# Patient Record
Sex: Female | Born: 1935 | Race: Black or African American | Hispanic: No | Smoking: Former smoker
Health system: Southern US, Community
[De-identification: ages and names within clinical notes are randomized; demographics above are authoritative.]

## PROBLEM LIST (undated history)

## (undated) DIAGNOSIS — G20 Parkinson's disease: Secondary | ICD-10-CM

## (undated) DIAGNOSIS — E785 Hyperlipidemia, unspecified: Secondary | ICD-10-CM

## (undated) DIAGNOSIS — R7989 Other specified abnormal findings of blood chemistry: Secondary | ICD-10-CM

## (undated) DIAGNOSIS — Z7901 Long term (current) use of anticoagulants: Secondary | ICD-10-CM

## (undated) DIAGNOSIS — G709 Myoneural disorder, unspecified: Secondary | ICD-10-CM

## (undated) DIAGNOSIS — F0390 Unspecified dementia without behavioral disturbance: Secondary | ICD-10-CM

## (undated) DIAGNOSIS — M1990 Unspecified osteoarthritis, unspecified site: Secondary | ICD-10-CM

## (undated) DIAGNOSIS — K219 Gastro-esophageal reflux disease without esophagitis: Secondary | ICD-10-CM

## (undated) DIAGNOSIS — E079 Disorder of thyroid, unspecified: Secondary | ICD-10-CM

## (undated) DIAGNOSIS — E039 Hypothyroidism, unspecified: Secondary | ICD-10-CM

## (undated) DIAGNOSIS — G20A1 Parkinson's disease without dyskinesia, without mention of fluctuations: Secondary | ICD-10-CM

## (undated) DIAGNOSIS — I739 Peripheral vascular disease, unspecified: Secondary | ICD-10-CM

## (undated) DIAGNOSIS — M79674 Pain in right toe(s): Secondary | ICD-10-CM

## (undated) DIAGNOSIS — I10 Essential (primary) hypertension: Secondary | ICD-10-CM

## (undated) HISTORY — PX: ABDOMINAL HYSTERECTOMY: SHX81

## (undated) HISTORY — DX: Unspecified dementia, unspecified severity, without behavioral disturbance, psychotic disturbance, mood disturbance, and anxiety: F03.90

## (undated) HISTORY — DX: Gastro-esophageal reflux disease without esophagitis: K21.9

## (undated) HISTORY — PX: OTHER SURGICAL HISTORY: SHX169

## (undated) HISTORY — DX: Essential (primary) hypertension: I10

## (undated) HISTORY — DX: Other specified abnormal findings of blood chemistry: R79.89

## (undated) HISTORY — DX: Unspecified osteoarthritis, unspecified site: M19.90

## (undated) HISTORY — DX: Disorder of thyroid, unspecified: E07.9

## (undated) HISTORY — DX: Pain in right toe(s): M79.674

## (undated) HISTORY — DX: Long term (current) use of anticoagulants: Z79.01

## (undated) HISTORY — DX: Peripheral vascular disease, unspecified: I73.9

## (undated) HISTORY — DX: Hyperlipidemia, unspecified: E78.5

---

## 2000-08-29 HISTORY — PX: FEMORAL-POPLITEAL BYPASS GRAFT: SHX937

## 2001-04-25 ENCOUNTER — Emergency Department (HOSPITAL_COMMUNITY): Admission: EM | Admit: 2001-04-25 | Discharge: 2001-04-25 | Payer: Self-pay | Admitting: Emergency Medicine

## 2001-04-25 ENCOUNTER — Encounter: Payer: Self-pay | Admitting: Emergency Medicine

## 2001-04-27 ENCOUNTER — Encounter: Payer: Self-pay | Admitting: Vascular Surgery

## 2001-04-27 ENCOUNTER — Encounter (INDEPENDENT_AMBULATORY_CARE_PROVIDER_SITE_OTHER): Payer: Self-pay | Admitting: *Deleted

## 2001-04-28 ENCOUNTER — Inpatient Hospital Stay (HOSPITAL_COMMUNITY): Admission: RE | Admit: 2001-04-28 | Discharge: 2001-05-03 | Payer: Self-pay | Admitting: Vascular Surgery

## 2001-07-23 ENCOUNTER — Other Ambulatory Visit: Admission: RE | Admit: 2001-07-23 | Discharge: 2001-07-23 | Payer: Self-pay | Admitting: Internal Medicine

## 2001-08-17 ENCOUNTER — Encounter: Payer: Self-pay | Admitting: Internal Medicine

## 2001-08-17 ENCOUNTER — Encounter: Admission: RE | Admit: 2001-08-17 | Discharge: 2001-08-17 | Payer: Self-pay | Admitting: Internal Medicine

## 2003-07-29 ENCOUNTER — Encounter: Admission: RE | Admit: 2003-07-29 | Discharge: 2003-07-29 | Payer: Self-pay | Admitting: Internal Medicine

## 2004-03-29 ENCOUNTER — Encounter: Admission: RE | Admit: 2004-03-29 | Discharge: 2004-03-29 | Payer: Self-pay | Admitting: Internal Medicine

## 2005-07-19 ENCOUNTER — Encounter: Admission: RE | Admit: 2005-07-19 | Discharge: 2005-07-19 | Payer: Self-pay | Admitting: Internal Medicine

## 2007-04-13 ENCOUNTER — Ambulatory Visit: Payer: Self-pay | Admitting: Vascular Surgery

## 2007-08-21 ENCOUNTER — Encounter: Admission: RE | Admit: 2007-08-21 | Discharge: 2007-08-21 | Payer: Self-pay | Admitting: Internal Medicine

## 2008-03-05 ENCOUNTER — Ambulatory Visit: Payer: Self-pay | Admitting: Vascular Surgery

## 2008-08-21 ENCOUNTER — Encounter: Admission: RE | Admit: 2008-08-21 | Discharge: 2008-08-21 | Payer: Self-pay | Admitting: Internal Medicine

## 2009-05-08 ENCOUNTER — Ambulatory Visit: Payer: Self-pay | Admitting: Vascular Surgery

## 2009-09-03 ENCOUNTER — Encounter: Admission: RE | Admit: 2009-09-03 | Discharge: 2009-09-03 | Payer: Self-pay | Admitting: Internal Medicine

## 2010-07-20 ENCOUNTER — Ambulatory Visit: Payer: Self-pay | Admitting: Vascular Surgery

## 2010-09-19 ENCOUNTER — Encounter: Payer: Self-pay | Admitting: Internal Medicine

## 2011-01-11 NOTE — Procedures (Signed)
LOWER EXTREMITY ARTERIAL EVALUATION-SINGLE LEVEL   INDICATION:  Followup of known peripheral vascular disease.  The patient  denies claudication and rest pain bilaterally.   HISTORY:  Diabetes:  No.  Cardiac:  No.  Hypertension:  Yes.  Smoking:  No.  Previous Surgery:  Left femoral/popliteal artery thrombectomies in  03/2001 by Dr. Arbie Cookey.   RESTING SYSTOLIC PRESSURES: (ABI)                          RIGHT                LEFT  Brachial:               148                  140  Anterior tibial:        166 (1.12)           118  Posterior tibial:       164                  130 (0.88)  Peroneal:                                    120  DOPPLER WAVEFORM ANALYSIS:  Anterior tibial:        Biphasic             Weakly biphasic  Posterior tibial:       Biphasic             Biphasic  Peroneal:                                    Biphasic   PREVIOUS ABI'S:  Date:  04/13/2007  RIGHT:  1.16  LEFT:  0.87   IMPRESSION:  Stable ABIs bilaterally.   ___________________________________________  Larina Earthly, M.D.   PB/MEDQ  D:  03/05/2008  T:  03/05/2008  Job:  [id number]

## 2011-01-11 NOTE — Assessment & Plan Note (Signed)
OFFICE VISIT   Farmer, Tanya  DOB:  [birth year]                                       04/13/2007  EAVWU#:[id number]   Tanya Farmer comes in today for continuing followup of her lower extremity  issues. She is status post bilateral femoral embolectomy in 2002. She is  found to be hypercoagulable. She tends to do quite well. She has had  some progressive dementia according to her daughter but is able live by  herself and take care of her own affairs. She is very active in a  walking program. She reports today that she has some dragging of her  left foot at the end of this but this is news to her daughter and may be  related to dementia. They will continue to watch this. She does have 2+  femoral and 2+ popliteal pulses. I do not palpate pedal pulses  bilaterally. Her feet are well perfused. Her ankle brachial index today  remains stable at normal at 1.0 on the right and 0.87 on the left. I am  quite pleased with her continued result. She will continue her walking  program. I plan to see her again in 1 year with repeat lower extremity  arterial studies.   Larina Earthly, M.D.  Electronically Signed   TFE/MEDQ  D:  04/13/2007  T:  04/16/2007  Job:  [id number]   cc:   Thora Lance, M.D.  Mathis Fare, MD

## 2011-01-11 NOTE — Assessment & Plan Note (Signed)
OFFICE VISIT   [place], HUBERTY  DOB:  Jun 08, 1926                                       05/08/2009  ZOXWR#:[id number]   The patient presents today with her daughter for continued followup of  lower extremity arterial insufficiency.  She looks good.  She does  continue to have some mild progressive dementia but has been able to  tolerate this.  She is active in her walking program and her daughter is  quite diligent in continuing to push this.  She is on a long list of  medicines that are attached to her chart.  She has no drug allergies.  She is status post right iliac superficial thrombectomy and a left  femoral popliteal thrombectomy in 2002, and had no new thromboembolic  events since that time.  Her feet are warm and well perfused.  She does  have popliteal pulses bilaterally.  I do not detect pedal pulses.  She  underwent a noninvasive left heart study in our office today and this  reveals no change from a prior study a year ago.  Ankle-arm index is  normal on the right and 0.88 on the left.  I have reassured the patient  with this and her daughter.  I explained that I would be comfortable  following her with just on observation only.  However, they are more  comfortable with continued yearly ankle-arm indices and office visits  with me.  We will see her in 1 year.   Larina Earthly, M.D.  Electronically Signed   TFE/MEDQ  D:  05/08/2009  T:  05/11/2009  Job:  [id number]   cc:   Thora Lance, M.D.

## 2011-01-11 NOTE — Assessment & Plan Note (Signed)
OFFICE VISIT   MYRIA, STEENBERGEN  DOB:  1926-08-29                                       07/20/2010  ZOXWR#:[id number]   The patient presents today for continued followup of her lower extremity  arterial insufficiency.  She is status post a right femoral popliteal  embolectomy by myself in 2002.  She is here today with her daughter.  She continues to do well.  [place] has been living with her daughter since  mid summer while her home is being repaired.  She does have some mild  dementia but her daughter reports this is not progressive.  Her past  history is otherwise unchanged.   PHYSICAL EXAM:  General:  A well-nourished, well-nourished black female  appearing her stated age of 75 in no acute disease.  Vital signs:  Blood  pressure is 110/73, pulse 71, O2 saturation 99% on room air,  respirations are 18.  HEENT:  Normal.  Her pulse status:  She has 2+  radial, 2+ femoral pulses bilaterally with no evidence of false aneurysm  in her right groin.  She does have 1+ popliteal and dorsalis pedal  pulses.  Musculoskeletal:  She has no major deformities or cyanosis.  Neurological:  No focal weakness, paresthesias.  Skin:  Without ulcers  or rashes.   She underwent noninvasive vascular laboratory studies today which I have  ordered and independently interpreted.  This reveals normal ankle arm  index on the right with triphasic waveforms and slightly diminished  waveforms biphasic on the left with an ankle arm index of 0.72.  This  has been stable over the number of years.  I have again discussed this  with the patient and her daughter, recommended continued followup  observation only.  She has been very stable for 9 years now and I feel  comfortable discontinuing her routine followup arterial studies.  I  again discussed symptoms of acute ischemic changes with the patient and  her daughter.  They  will notify us immediately should this occur.  Otherwise they will see [place] on an as-needed basis.     Larina Earthly, M.D.  Electronically Signed   TFE/MEDQ  D:  07/20/2010  T:  07/21/2010  Job:  [id number]   cc:   Thora Lance, M.D.

## 2011-01-14 NOTE — Op Note (Signed)
[place]. [facility]  Patient:    Tanya Farmer, Tanya Farmer Visit Number: [id number] MRN: [id number]          Service Type: Attending:  Larina Earthly, M.D. Dictated by:   Larina Earthly, M.D. Proc. Date: 04/27/01   CC:         Darci Needle, M.D.   Operative Report  PREOPERATIVE DIAGNOSIS:  Emboli to left foot.  POSTOPERATIVE DIAGNOSIS:  Emboli to left foot.  OPERATION: 1. Aortogram. 2. Bilateral lower extremity runoff.  SURGEON:  Larina Earthly, M.D.  ANESTHESIA:  1% lidocaine local.  COMPLICATIONS:  None.  DISPOSITION:  To holding area, stable.  DESCRIPTION OF PROCEDURE:  The patient was taken to the peripheral vascular catheterization lab, placed in supine position, and the area of both groins were prepped and draped in the usual sterile fashion.  Using local anesthesia and a single wall puncture, the right common femoral artery was entered, and guidewire was passed up to the level of the thoracic aorta.  A 5-French sheath was passed over the guidewire, and a pigtail catheter was passed up to the level of the aortic arch.  Thoracic aortic injection was undertaken, and this revealed no evidence of irregularity in the thoracic aorta.  Mesenteric vessels were widely patent on the AP projection.  The catheter was then withdrawn to the level of the renal arteries.  The patient had widely patent single renal arteries and no evidence of atherosclerotic change in the aortoiliac system.  The catheter was then drawn down to the aortic bifurcation, and runoff was obtained.  This showed patent profunda femoris arteries bilaterally with no evidence of thrombus in the profundus femoral system.  There was on the left a complete occlusion of the superficial femoral artery throughout the mid thigh with reconstitution of the popliteal artery at the adductor canal.  The popliteal artery then reoccluded at the knee.  There was reconstitution of the posterior tibial artery  after its takeoff with no visualization of the anterior tibial peroneal artery on the left.  The posterior tibial artery was complete into the foot.  On the right, the superficial femoral artery was patent; however there was a probable thrombus that was nonocclusive in the proximal superficial femoral artery and also irregularity and possible thrombus at the level of the above-knee popliteal artery.  There was what appeared to be chronic occlusion of the anterior tibial artery on the right with normal peroneal and posterior tibial artery runoff into the foot.  The patient tolerated the procedure well without any immediate complication and was transferred to the holding area in stable condition.  FINDINGS: 1. No evidence of aortoiliac occlusive disease. 2. Apparent thrombus, probable embolus to the left superficial femoral artery    with reconstitution of the above-knee popliteal artery and reocclusion of    the popliteal artery at the knee with posterior tibial runoff only on the    left.  On the right, there was nonocclusive thrombus in the superficial    femoral artery with anterior tibial artery occluded and peroneal and    posterior tibial runoff to the foot. Dictated by:   Larina Earthly, M.D. Attending:  Larina Earthly, M.D. DD:  04/27/01 TD:  04/27/01 Job: [id number]

## 2011-01-14 NOTE — Discharge Summary (Signed)
[place]. [facility]  Patient:    Tanya Farmer, Tanya Farmer Visit Number: [id number] MRN: [id number]          Service Type: MED Location: 2000 2035 [id number] Attending Physician:  Alyson Locket Dictated by:   Lissa Merlin, P.A. Admit Date:  04/27/2001 Discharge Date: 05/03/2001   CC:         CVTS Office  Darci Needle, M.D.  Lowell C. Catha Gosselin, M.D.  Laurette Schimke, M.D., [place], [place]   Discharge Summary  DATE OF BIRTH:  [birth year]  PRIMARY CARE:  Laurette Schimke, M.D., [place], [place]  CARDIOLOGY:  Darci Needle, M.D.  ADMISSION DIAGNOSIS:  Ischemic lower extremities with cyanotic toes.  DISCHARGE DIAGNOSES: 1. 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 2. Right superficial femoral artery occlusion. 3. Thrombocytosis.  PREVIOUS MEDICAL CONDITIONS: 1. Hypothyroidism. 2. GERD. 3. Hypertension. 4. Sick sinus syndrome with relative bradycardia. 5. Hypercholesterolemia. 6. Degenerative arthritis.  BRIEF HISTORY:  The patient is a 77 year old African-American female who was visiting her daughter in the [place] area.  She currently lives in [place], [place].  She presented to the [facility] Emergency Room on August 28 with blue toes.  Exam also showed a left bulging calf. Dr. Tawanna Cooler Early evaluated Tanya Farmer with an aortogram on August 30, which showed left superficial femoral artery and popliteal artery occlusion and right superficial femoral artery occlusion.  Dr. Arbie Cookey recommended surgery. Risks, benefits, details, and alternatives were discussed and it was agreed to proceed.  She was taken to the OR for left femoral and popliteal embolectomy and right femoral embolectomy.  She was taken to unit 3300, stable. Hematology was consulted to evaluate thrombocytosis.  Dr. Catha Gosselin saw Tanya Farmer. He recommended chronic Coumadin therapy.  Tanya Farmer remained stable on unit 3300 while waiting for her INR to increase.   She was given physical therapy daily with gradual improvement in her walking.  She was given a walker to take home.  By May 03, 2001, she was therapeutic on her Coumadin.  Her lower extremities were well perfused.  She had palpable pulses in her lower extremities.  She was afebrile.  Vital signs were stable.  She was deemed suitable for discharge home and was subsequently discharged.  PROCEDURES: 1. Aortogram with bilateral lower extremity runoff on April 27, 2001. 2. Left FEM-POP embolectomy and right femoral embolectomy on April 27, 2001. 3. TEE on April 27, 2001 showing no vegetations.  MEDICATIONS: 1. HCTZ 25 mg 1 p.o. q.d. 2. Prilosec 20 mg 1 p.o. q.d. 3. Synthroid 0.1 mcg 1 p.o. q.d. 4. Altace 2.5 mg 1 p.o. q.d. 5. Darvocet 1-2 p.o. q.4-6h. p.r.n. for pain. 6. Klor-Con 10 mg p.o. b.i.d. 7. Coumadin 5 mg tablets daily or as directed.  ALLERGIES:  No known drug allergies.  CONDITION:  Stable.  SPECIAL INSTRUCTIONS:  She was told to do no driving, no heavy lifting, or strenuous activity.  Clean her wounds gently daily with soap and water and to be alert for increasing swelling, drainage, or fever and to call the office if she had any problems.  She was told the importance of having her blood drawn to check her Coumadin as scheduled and to follow up with Dr. Catha Gosselin in eight weeks.  FOLLOW-UP: 1. Home health R.N. will draw blood on May 04, 2001 with pro time result    to Dr. Nicholes Mango office. 2. Home health physical therapy and occupational therapy. 3. Dr.  Early May 10, 2001 at 2 p.m. with ABIs of the lower extremities. 4. Dr. Catha Gosselin eight weeks after discharge. Dictated by:   Lissa Merlin, P.A. Attending Physician:  Alyson Locket DD:  05/15/01 TD:  05/15/01 Job: [id number]

## 2011-01-14 NOTE — Consult Note (Signed)
[place]. [facility]  Patient:    Tanya Farmer, Tanya Farmer Visit Number: [id number] MRN: [id number]          Service Type: DSU Location: [facility] 2899 [id number] Attending Physician:  Alyson Locket Dictated by:   Lowell C. Catha Gosselin, M.D. Proc. Date: 04/27/01 Admit Date:  04/27/2001   CC:         Larina Earthly, M.D.   Consultation Report  SUMMARY:  The patient is a 77 year old female from [place], [place], who was noted by her daughter to have blue toes last week.  She returned to [facility] with her daughter and went to [facility] Room for evaluation.  Arteriogram done April 27, 2001 showed findings of a left superficial femoral artery and popliteal occlusion with right superficial femoral artery occlusion.  The patient was subsequently admitted for bilateral fem-pop embolectomy, which she underwent earlier today.  Preoperative laboratory work showed a hemoglobin of 13.5, white count 7.5, platelet count 711000.  We were consulted due to work-up for her possible hypercoagulable state and thrombocytosis.  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  History of hypercholesterolemia. History of degenerative arthritis.  History of early sick sinus syndrome.  MEDICATIONS:  At home, include diazepam, hydrochlorothiazide, potassium, Prilosec, and Synthroid.  ALLERGY:  None.  FAMILY HISTORY:  Positive for hypertension.  SOCIAL HISTORY:  She lives in [place] by herself.   She is divorced and has two children.  She is employed by [employer].  She has also worked as a Producer, television/film/video.  No alcohol or tobacco use.  No recent mammograms, no colonoscopy.  REVIEW OF SYSTEMS:  There is a 12 pound weight loss over the last six months, pretty good appetite otherwise.  Positive cough due to reflux.  PHYSICAL EXAMINATION:  VITAL SIGNS:  Temperature 95.9, pulse 48, respirations 7, blood  pressure 154/70.  O2 saturation 100% on 4 liters.  HEENT EXAMINATION:  She is normocephalic, atraumatic.  Right pupil is round and reactive; left pupil is slightly irregular postsurgical change and reactive.  ______ , but clear.  LUNGS:  Clear anteriorly.  CARDIOVASCULAR:  Regular rhythm, but bradycardic.  ABDOMEN:  Soft, nontender, no hepatosplenomegaly.  EXTREMITIES:  No edema.  LABORATORY STUDIES:  White count of 7.5, hemoglobin 13.5, MCV of 90.3, platelet count 711000.  Sodium 139, potassium 3.2, BUN of 17, creatinine 0.9, total bilirubin of 0.8, alkaline phosphatase is 70, GOT of 26, GBT of 21, total protein of 7.7, albumin 3.9, calcium 9.6.  Protein C level is back as 98%, which is within normal range.  Pending is a ______ coagulant level and protein S level.  Chest x-ray from April 27, 2001 showed no active lung disease and minimal cardiomegaly.  Abdominal CT and scan from April 25, 2001 showed bilateral simple appearing renal cysts.  Pelvic CT from April 25, 2001 showed no evidence of sources in the iliac arterial system for distal embolization.  ASSESSMENT:  A 77 year old female admitted for fem-pop embolectomy and found to have an elevated platelet count.  Thrombocytosis dates back a while.  Her platelet count in 1994 was 268000, in 1995 278000, in 1996 - 285000, 1997 - 297000, 1998 - 250000, year 2000 358000 and then March 2001 - 681000.  It is certainly possible that iron-deficiency could be the cause of her elevated platelet count.  Platelets also increase due to acute phase reacting effects.  It is possible that the patient has a  central thrombocytosis and might require bone marrow eventually.  However, for now will continue heparin and then coumadinize with a goal INR of around 2.5.  Will follow up for hypercoagulable work-up.  Since the patient is African-American, I would not get a factor V Leiden work-up since that is quite rare in the  African-American population.  Thank you for allowing [place] to share in her care. Dictated by:   Lowell C. Catha Gosselin, M.D. Attending Physician:  Alyson Locket DD:  04/28/01 TD:  04/28/01 Job: [id number]

## 2011-01-14 NOTE — H&P (Signed)
[place]. [facility]  Patient:    Tanya Farmer, Tanya Farmer Visit Number: [id number] MRN: [id number]          Service Type: MED Location: [phone] Attending Physician:  Alyson Locket Dictated by:   Dominica Severin, P.A. Admit Date:  04/27/2001   CC:         Laurette Schimke, M.D., [place], [place]  Darci Needle, M.D.  CVTS office   History and Physical  DATE OF BIRTH:  03/22/1926  CHIEF COMPLAINT:  Blue toes.  HISTORY OF PRESENT ILLNESS:  This patient is a 77 year old African-American female who was visiting her daughter in the [place] area.  She currently resides in [place], [place].  She presented to [facility] ER on August 28 with blue toes.  Exam also revealed left calf bulging.  She was scheduled for outpatient echocardiogram on August 29 by Dr. Garnette Scheuermann to rule out any cardiac source of emboli, and an aortogram with runoff performed by Dr. Gretta Began on August 30 showed left superficial femoral artery and popliteal occlusion and right superficial femoral occlusion.  She was admitted for urgent right femoral popliteal embolectomy on April 27, 2001.  PAST MEDICAL HISTORY:  This was taken from the medical records from Dr. Dorothy Puffer office.  The patient is currently recovering from anesthesia in the post anesthesia care unit.  1. Hypothyroidism. 2. Gastroesophageal reflux disease. 3. Hypertension. 4. Early sick sinus syndrome with relative bradycardia. 5. Hypercholesterolemia. 6. Degenerative arthritis.  ALLERGIES:  No known drug allergies.  MEDICATIONS: 1. Diazepam 2.5 mg q.p.m. 2. Hydrochlorothiazide 25 mg daily. 3. Klor-Con 10 mEq daily. 4. Prilosec 20 mg daily. 5. Synthroid 100 mcg daily.  REVIEW OF SYSTEMS:  Please see HPI for pertinent positives and past medical history, as the patient is a poor historian and is currently recovering from anesthesia.  PHYSICAL EXAMINATION:  VITAL SIGNS:  Currently blood pressure  is 158/70, pulse 60, respiratory rate 13, temperature is 95.9.  HEENT:  PERRLA/EOMI.  NECK:  Without thyromegaly, bruits, or JVD.  HEART:  Irregularly irregular and bradycardic.  LUNGS:  Clear to auscultation anteriorly.  ABDOMEN:  Positive bowel sounds, soft, nontender.  GENITOURINARY:  Foley is draining clear urine.  EXTREMITIES:  Without clubbing or swelling.  Surgical dressing is intact, no drainage in the right groin and left lower extremity.  Palpable posterior tibial pulses bilaterally.  Toes of the left foot have blue tinge, right foot toes are pink.  PREOPERATIVE LABORATORY:  White blood cell count 7.5, hemoglobin 13.5, hematocrit 39.9, and platelet count of 711.  PT is 14.0, INR is 1.1, PTT 32. Sodium was 139, potassium 3.2, chloride 101, CO2 30, BUN 17, creatinine 0.9, glucose 114.  Liver function tests were within normal limits.  Also drawn without any results yet were protein C, protein S, and a Leiden factor.  IMPRESSION:  Status post left femoral and popliteal embolectomy and right femoral embolectomy.  PLAN:  The patient is to be admitted to unit 3300.  She is to be started on heparin drip later on today and we are to await the results of the coag studies, and a hematology consult will be obtained. Dictated by:   Dominica Severin, P.A. Attending Physician:  Alyson Locket DD:  04/29/01 TD:  04/29/01 Job: [id number]

## 2011-01-14 NOTE — Op Note (Signed)
[place]  Patient:    Tanya Farmer, PACK Visit Number: [id number] MRN: [id number]          Service Type: Attending:  Larina Earthly, M.D. Dictated by:   Larina Earthly, M.D. Proc. Date: 04/28/01   CC:         Darci Needle, M.D.   Operative Report  PREOPERATIVE DIAGNOSIS:  Bilateral lower extremity embolus and thrombus.  POSTOPERATIVE DIAGNOSIS:  Bilateral lower extremities embolus and thrombus.  PROCEDURES: 1. Left popliteal artery exploration and femoral and popliteal and    tibial thrombectomy. 2. Right femoral artery exploration with iliac and superficial femoral    artery thrombectomy. 3. Transesophageal echocardiogram per anesthesia.  SURGEON:  Larina Earthly, M.D.  ASSISTANT:  Maxwell Marion, RNFA  ANESTHESIA:  General endotracheal.  COMPLICATIONS:  None.  PROCEDURE IN DETAIL:  The patient was taken to the operating room and placed in a position where both groins and both legs were prepped and draped in the usual sterile fashion.  An incision was made, the medial approach to the below-knee popliteal artery on the left leg, carried down to isolate the popliteal artery which was encircled with a vessel loop.  The popliteal artery had minimal atherosclerotic changes, but obviously was occluded with thrombus. The anterior tibial, posterior tibial, and peroneal arteries were all exposed and encircled with vessel loops.  The patient was given 7000 units of intravenous heparin.  After adequate circulation time, the popliteal artery was opened transversely with an 11 blade above the level of the trifurcation. A 4 Fogarty catheter was passed proximally, and the superficial femoral artery was thrombectomized with thrombus being sent for permanent specimen.  When no further thrombus was removed, there was excellent inflow to this level.  The artery was reoccluded with a vascular clamp.  Next, a 3 Fogarty catheter was passed down the  anterior tibial, posterior tibial, and peroneal arteries sequentially.  The anterior tibial catheter could only be passed approximately 10 cm with apparent chronic occlusion high on the anterior tibial artery.  The peroneal and posterior tibial arteries catheter was passed distal to the level of the ankle.  When no further thrombus was removed from these two vessels, the artery was reoccluded.  The popliteal artery was closed with interrupted 6-0 Prolene sutures.  Clamps were removed, and good Doppler signal was noted in the foot.  Next, a separate incision was made at the right groin, carried down to dissect the common, superficial femoral, and profunda femoris arteries which were encircled with vessel loops.  The common, superficial femoral, and profunda femoris arteries were occluded.  The common femoral artery was opened transversely above the takeoff of the profunda.  The 4 Fogarty catheter was passed up to the level of the aortic bifurcation, and there was nonocclusive thrombus present in the iliac artery, and this was sent for specimen as well. The artery was reoccluded.  Next, the catheter was passed down to the foot and removed with a moderate amount of thrombus removed from the superficial femoral artery.  When no further thrombus was removed with a negative pass, the artery was reoccluded.  The common femoral artery was closed with interrupted 6-0 Prolene sutures.  Clamps were removed.  A Doppler signal was noted in the foot.  The patients heparin was not reversed with Protamine. The wounds were irrigated with saline.  Hemostasis was obtained with electrocautery.  The wounds were closed with 3-0 Vicryl in several layers in the subcutaneous  tissue, and the skin was closed with a 4-0 subcuticular Vicryl stitch.  Sterile dressing was applied, and the patient was taken to the recovery room in stable condition. Dictated by:   Larina Earthly, M.D. Attending:  Larina Earthly, M.D. DD:   04/27/01 TD:  04/28/01 Job: [id number]

## 2011-01-14 NOTE — Procedures (Signed)
[place]  Patient:    HOA, BRIGGS Visit Number: [id number] MRN: [id number]          Service Type: MED Location: [phone] Attending Physician:  Alyson Locket Dictated by:   Cliffton Asters Ivin Booty, M.D. Proc. Date: 04/27/01 Admit Date:  04/27/2001   CC:         Anesthesia Department   Procedure Report  PROCEDURE:  Intraoperative transesophageal echocardiography.  INDICATIONS FOR PROCEDURE:  Ms. Fales was brought to the operating room by Dr. Tawanna Cooler Early for embolectomy of both of her lower extremities.  She had a 2d echocardiogram of her heart earlier in the day and it was difficult to ascertain whether her tricuspid valve had some difficulty, such as a vegetation or other valvular problem.  It was felt that it would be appropriate to try and image her intraoperatively.  DESCRIPTION OF PROCEDURE:  After successful induction of general anesthesia with endotracheal intubation, the [employer] Omnipen TEE probe was passed through the oropharynx into the esophagus atraumatically.  The left ventricle was imaged and found to be contracting well, with no hypertrophy noted.  The right ventricle was somewhat larger than one would expect.  However, it was not overly dilated. The tricuspid valve was quite redundant, but there was no sign of vegetation.  The moderator band was seen clearly.  The mitral valve was then examined and found to oppose nicely with 1+ mitral regurgitation noted on color-flow examination.  The aortic valve was examined and seen to be tricuspid in nature, with no extra calcification noted.  The leaflets all moved well.  There was 1+ aortic regurgitation on the color-flow examination.  The interatrial septum was examined and with color-flow Doppler was found not to have any sign of a PFO.  The left atrial appendage was seen and there was no evidence of thrombus or smoke.  There was an excellent longitudinal view of  the pulmonic valve noted, and there was trace to 1+ regurgitation on color-flow examination.  IMPRESSION:  Relatively normal anatomy in the ventricles, with mild aortic and mitral regurgitation noted.  The tricuspid valve was redundant.  No vegetations were seen. Dictated by:   Cliffton Asters Ivin Booty, M.D. Attending Physician:  Alyson Locket DD:  05/01/01 TD:  05/01/01 Job: [id number]

## 2011-07-20 ENCOUNTER — Other Ambulatory Visit: Payer: Self-pay | Admitting: Internal Medicine

## 2011-07-20 DIAGNOSIS — Z1231 Encounter for screening mammogram for malignant neoplasm of breast: Secondary | ICD-10-CM

## 2011-09-05 ENCOUNTER — Ambulatory Visit: Payer: Self-pay

## 2011-09-22 ENCOUNTER — Ambulatory Visit
Admission: RE | Admit: 2011-09-22 | Discharge: 2011-09-22 | Disposition: A | Discharge status: Home / Self Care | Payer: Medicare Other | Source: Ambulatory Visit | Attending: Internal Medicine | Admitting: Internal Medicine

## 2011-09-22 DIAGNOSIS — Z1231 Encounter for screening mammogram for malignant neoplasm of breast: Secondary | ICD-10-CM

## 2012-04-19 ENCOUNTER — Telehealth: Payer: Self-pay

## 2012-04-19 DIAGNOSIS — D473 Essential (hemorrhagic) thrombocythemia: Secondary | ICD-10-CM

## 2012-04-19 DIAGNOSIS — M79674 Pain in right toe(s): Secondary | ICD-10-CM

## 2012-04-19 NOTE — Telephone Encounter (Signed)
Spoke with daughter to schedule, dpm

## 2012-04-19 NOTE — Telephone Encounter (Signed)
Phone call rec'd from daughter, Tanya Farmer, to report onset of toe pain in right 2nd and 3rd toes this morning.  Daughter reported pt. has hx of elevated platelets, and now platelets are "over 1 million", as reported per her hematologist recently.  Daughter voiced concern of new onset of pain in toes, with present condition of elevated platelets, and hx of left femoral, popliteal, and tibial embolectomy in 2002.  Denies any discoloration of right 2nd and 3rd toes.  States she also noted pt. has "darker, more obvious veins visible in thighs, bilaterally.  Denies any protrusion of veins in thighs.  States pt. denies pain in legs; only c/o pain in above noted toes.  Denies any open sores.  States some swelling in ankles.  States pt. has dementia, and can't always verbalize what she is feeling.  Discussed w/ Dr. Darrick Penna.  Rec'd v.o. for ABI's with office visit.  Advised daughter office will call to schedule appt. and if pt's. symptoms worsen, she should call and report this.  Verb. Understanding.

## 2012-05-02 ENCOUNTER — Encounter: Payer: Self-pay | Admitting: Vascular Surgery

## 2012-05-07 ENCOUNTER — Encounter: Payer: Self-pay | Admitting: Vascular Surgery

## 2012-05-08 ENCOUNTER — Ambulatory Visit (INDEPENDENT_AMBULATORY_CARE_PROVIDER_SITE_OTHER): Payer: Medicare Other | Admitting: Vascular Surgery

## 2012-05-08 ENCOUNTER — Encounter: Payer: Self-pay | Admitting: Vascular Surgery

## 2012-05-08 VITALS — BP 135/62 | HR 65 | Resp 18 | Ht 64.0 in | Wt 135.2 lb

## 2012-05-08 DIAGNOSIS — M79609 Pain in unspecified limb: Secondary | ICD-10-CM

## 2012-05-08 DIAGNOSIS — I739 Peripheral vascular disease, unspecified: Secondary | ICD-10-CM

## 2012-05-08 DIAGNOSIS — D75839 Thrombocytosis, unspecified: Secondary | ICD-10-CM

## 2012-05-08 DIAGNOSIS — D473 Essential (hemorrhagic) thrombocythemia: Secondary | ICD-10-CM

## 2012-05-08 DIAGNOSIS — M79674 Pain in right toe(s): Secondary | ICD-10-CM

## 2012-05-08 NOTE — Progress Notes (Signed)
Patient is known to me from prior bilateral iliac femoral popliteal and tibial thrombectomies and 2002. She had a hypercoagulable state presented with severe ischemia in both legs. She has done well and the subsequent 11 years and has been on Coumadin and also on antiplatelet medication. Recently she has had recurrent of elevated platelets now up over 1 million. She had one episode of some pain in her right second and third toes and is concern that this may be a sign of difficulty. She is seen today for further evaluation. She walks well with a walker and has no limitation. She has no neurologic deficits  Past Medical History  Diagnosis Date  . Hypertension   . Hyperlipidemia   . Thyroid disease     Hypothyroidism  . GERD (gastroesophageal reflux disease)   . Arthritis     Degenerative  . Peripheral vascular disease     Left   Fem-Pop and  tibial Thrombectomy  . High platelet count   . Dementia     History  Substance Use Topics  . Smoking status: Former Games developer  . Smokeless tobacco: Not on file  . Alcohol Use: No    History reviewed. No pertinent family history.  No Known Allergies  Current outpatient prescriptions:amLODipine (NORVASC) 2.5 MG tablet, Take 2.5 mg by mouth daily., Disp: , Rfl: ;  atorvastatin (LIPITOR) 10 MG tablet, Take 10 mg by mouth daily., Disp: , Rfl: ;  donepezil (ARICEPT) 10 MG tablet, Take 10 mg by mouth at bedtime as needed., Disp: , Rfl: ;  hydroxyurea (HYDREA) 500 MG capsule, Take 500 mg by mouth 2 (two) times daily. May take with food to minimize GI side effects., Disp: , Rfl:  levothyroxine (SYNTHROID, LEVOTHROID) 100 MCG tablet, Take 100 mcg by mouth daily., Disp: , Rfl: ;  losartan (COZAAR) 50 MG tablet, Take 50 mg by mouth daily., Disp: , Rfl: ;  traZODone (DESYREL) 50 MG tablet, Take 50 mg by mouth at bedtime., Disp: , Rfl: ;  warfarin (COUMADIN) 5 MG tablet, Take 5 mg by mouth daily., Disp: , Rfl: ;  hydrochlorothiazide (MICROZIDE) 12.5 MG capsule, Take  12.5 mg by mouth 3 (three) times a week., Disp: , Rfl:  potassium chloride (K-DUR,KLOR-CON) 10 MEQ tablet, Take 10 mEq by mouth daily., Disp: , Rfl:   BP 135/62  Pulse 65  Resp 18  Ht 5\' 4"  (1.626 m)  Wt 135 lb 3.2 oz (61.326 kg)  BMI 23.21 kg/m2  Body mass index is 23.21 kg/(m^2).       Physical exam: Well-developed well-nourished black female no acute distress Carotid arteries without bruits bilaterally Heart regular rate and rhythm Pulse status 2+ radial pulses bilaterally. She has 2+ femoral pulses and 2+ right posterior tibial pulse. I do not palpate left pedal pulses Skin without ulcers or rashes Neurologically she is grossly intact Her right foot has no evidence of ischemic changes and no the tissue loss  Noninvasive vascular laboratory study was ordered and independently reviewed. This reveals a normal ankle arm index on the right and normal digital pressures. On the left recommend axis slightly diminished at 0.89 and also with normal digital pressures.  Impression and plan: Stable lower extremity arterial flow with bilateral thrombectomy do to hypercoagulable state 11 years ago. She her daughter reassured regarding this and will continue her walking program. She'll see [place] again on an as-needed basis

## 2012-05-08 NOTE — Progress Notes (Signed)
Ankle brachial index performed @ [facility] 05/08/2012

## 2012-09-10 ENCOUNTER — Telehealth: Payer: Self-pay | Admitting: Vascular Surgery

## 2012-09-10 ENCOUNTER — Telehealth: Payer: Self-pay

## 2012-09-10 DIAGNOSIS — M79676 Pain in unspecified toe(s): Secondary | ICD-10-CM

## 2012-09-10 NOTE — Telephone Encounter (Addendum)
Daughter called to report pt. C/o pain and soreness of 2nd and 3rd toes of right foot.  States this has been occurring for approx. 8 wks.  States that the toes noted are "slightly puffy". Reports that pt. Has had difficulty wearing shoes due to the discomfort.  When questioned about discoloration, daughter stated that "all her toes look slightly discolored, like they are bruised."  Denies any open sores.  Had exam per PCP in December, and reported it then, and told to continue to monitor.  Saw Dr. Arbie Cookey in Sept. 2013, and had reported the discomfort in the same toes at that time.   Daughter requests appt. For pt. To reevaluate.  Discussed w/ Dr. Hart Rochester.  Rec'd v.o. To sched. For ABI's and office visit with Dr. Arbie Cookey.    Daughter stated that the present symptoms are similar to previous condition that required surgery for blood clots.

## 2012-09-10 NOTE — Telephone Encounter (Signed)
notified pt.'s daughter of appt. with tfe on 09-18-12 at 11:30 as per carol's staff message on 09-10-12

## 2012-09-17 ENCOUNTER — Encounter: Payer: Self-pay | Admitting: Vascular Surgery

## 2012-09-18 ENCOUNTER — Encounter (INDEPENDENT_AMBULATORY_CARE_PROVIDER_SITE_OTHER): Payer: Medicare Other | Admitting: *Deleted

## 2012-09-18 ENCOUNTER — Encounter: Payer: Self-pay | Admitting: Vascular Surgery

## 2012-09-18 ENCOUNTER — Ambulatory Visit (INDEPENDENT_AMBULATORY_CARE_PROVIDER_SITE_OTHER): Payer: Medicare Other | Admitting: Vascular Surgery

## 2012-09-18 VITALS — BP 131/78 | HR 64 | Resp 18 | Ht 63.0 in | Wt 133.0 lb

## 2012-09-18 DIAGNOSIS — I739 Peripheral vascular disease, unspecified: Secondary | ICD-10-CM

## 2012-09-18 DIAGNOSIS — M79676 Pain in unspecified toe(s): Secondary | ICD-10-CM

## 2012-09-18 DIAGNOSIS — M79674 Pain in right toe(s): Secondary | ICD-10-CM | POA: Insufficient documentation

## 2012-09-18 DIAGNOSIS — M79609 Pain in unspecified limb: Secondary | ICD-10-CM

## 2012-09-18 NOTE — Progress Notes (Signed)
The patient presents today for evaluation of pain in her second and third toes. I had seen her in September 2013 for similar complaint. She is here today with her daughter. She did have initial presentation of hypercoagulability with bilateral profound ischemia and 2002. She underwent bilateral femoral popliteal and tibial embolectomies and is doing quite well and the 12 year since this. She is having some continued deterioration in her mental capacity but still answers questions appropriately. She denies any pain. He is on chronic Coumadin therapy and also has elevated platelets which is also being treated  Past Medical History  Diagnosis Date  . Hypertension   . Hyperlipidemia   . Thyroid disease     Hypothyroidism  . GERD (gastroesophageal reflux disease)   . Arthritis     Degenerative  . Peripheral vascular disease     Left   Fem-Pop and  tibial Thrombectomy  . High platelet count   . Dementia   . Pain in toe of right foot     History  Substance Use Topics  . Smoking status: Former Games developer  . Smokeless tobacco: Not on file  . Alcohol Use: No    History reviewed. No pertinent family history.  No Known Allergies  Current outpatient prescriptions:amLODipine (NORVASC) 2.5 MG tablet, Take 2.5 mg by mouth daily., Disp: , Rfl: ;  atorvastatin (LIPITOR) 10 MG tablet, Take 10 mg by mouth daily., Disp: , Rfl: ;  donepezil (ARICEPT) 10 MG tablet, Take 10 mg by mouth at bedtime as needed., Disp: , Rfl: ;  hydroxyurea (HYDREA) 500 MG capsule, Take 500 mg by mouth 2 (two) times daily. May take with food to minimize GI side effects., Disp: , Rfl:  levothyroxine (SYNTHROID, LEVOTHROID) 100 MCG tablet, Take 100 mcg by mouth daily., Disp: , Rfl: ;  losartan (COZAAR) 50 MG tablet, Take 50 mg by mouth daily., Disp: , Rfl: ;  pantoprazole (PROTONIX) 40 MG tablet, Take 40 mg by mouth daily., Disp: , Rfl: ;  traZODone (DESYREL) 50 MG tablet, Take 50 mg by mouth at bedtime., Disp: , Rfl: ;  warfarin  (COUMADIN) 5 MG tablet, Take 5 mg by mouth daily., Disp: , Rfl:  hydrochlorothiazide (MICROZIDE) 12.5 MG capsule, Take 12.5 mg by mouth 3 (three) times a week., Disp: , Rfl: ;  potassium chloride (K-DUR,KLOR-CON) 10 MEQ tablet, Take 10 mEq by mouth daily., Disp: , Rfl:   BP 131/78  Pulse 64  Resp 18  Ht 5\' 3"  (1.6 m)  Wt 133 lb (60.328 kg)  BMI 23.56 kg/m2  Body mass index is 23.56 kg/(m^2).       Physical exam well-developed well-nourished female in no acute distress Respirations nonlabored Neurologically she is grossly intact Pulse status: 2+ radial and 2+ right posterior tibial pulse. I do not palpate left pedal pulses Skin without ulcers or rashes.  Vascular lab studies today reveal normal waveform which is triphasic of the right with normal ankle arm index and slightly dampen biphasic waveforms on the left with a maximum index stable at 0.85  No evidence of arterial insufficiency. The patient and her daughter reassured with this. She will continue her usual activity and will see [place] on an as-needed basis

## 2012-10-25 ENCOUNTER — Other Ambulatory Visit: Payer: Self-pay

## 2012-10-25 DIAGNOSIS — Z1231 Encounter for screening mammogram for malignant neoplasm of breast: Secondary | ICD-10-CM

## 2012-11-05 ENCOUNTER — Ambulatory Visit
Admission: RE | Admit: 2012-11-05 | Discharge: 2012-11-05 | Disposition: A | Discharge status: Home / Self Care | Payer: Medicare Other | Source: Ambulatory Visit

## 2012-11-05 DIAGNOSIS — Z1231 Encounter for screening mammogram for malignant neoplasm of breast: Secondary | ICD-10-CM

## 2013-06-13 ENCOUNTER — Ambulatory Visit (INDEPENDENT_AMBULATORY_CARE_PROVIDER_SITE_OTHER): Payer: Medicare Other | Admitting: Pharmacist

## 2013-06-13 DIAGNOSIS — I749 Embolism and thrombosis of unspecified artery: Secondary | ICD-10-CM | POA: Insufficient documentation

## 2013-06-13 LAB — POCT INR: INR: 2.7

## 2013-07-05 ENCOUNTER — Encounter (HOSPITAL_COMMUNITY): Payer: Self-pay | Admitting: Emergency Medicine

## 2013-07-05 ENCOUNTER — Emergency Department (HOSPITAL_COMMUNITY): Payer: Medicare Other

## 2013-07-05 ENCOUNTER — Inpatient Hospital Stay (HOSPITAL_COMMUNITY)
Admission: EM | Admit: 2013-07-05 | Discharge: 2013-07-07 | DRG: 312 | Disposition: A | Discharge status: Home / Self Care | Payer: Medicare Other | Attending: Internal Medicine | Admitting: Internal Medicine

## 2013-07-05 ENCOUNTER — Telehealth: Payer: Self-pay | Admitting: Interventional Cardiology

## 2013-07-05 DIAGNOSIS — I739 Peripheral vascular disease, unspecified: Secondary | ICD-10-CM | POA: Diagnosis present

## 2013-07-05 DIAGNOSIS — K219 Gastro-esophageal reflux disease without esophagitis: Secondary | ICD-10-CM | POA: Diagnosis present

## 2013-07-05 DIAGNOSIS — Z7901 Long term (current) use of anticoagulants: Secondary | ICD-10-CM

## 2013-07-05 DIAGNOSIS — D6859 Other primary thrombophilia: Secondary | ICD-10-CM | POA: Diagnosis present

## 2013-07-05 DIAGNOSIS — E039 Hypothyroidism, unspecified: Secondary | ICD-10-CM | POA: Diagnosis present

## 2013-07-05 DIAGNOSIS — R9431 Abnormal electrocardiogram [ECG] [EKG]: Secondary | ICD-10-CM

## 2013-07-05 DIAGNOSIS — D473 Essential (hemorrhagic) thrombocythemia: Secondary | ICD-10-CM | POA: Diagnosis present

## 2013-07-05 DIAGNOSIS — I749 Embolism and thrombosis of unspecified artery: Secondary | ICD-10-CM

## 2013-07-05 DIAGNOSIS — M129 Arthropathy, unspecified: Secondary | ICD-10-CM | POA: Diagnosis present

## 2013-07-05 DIAGNOSIS — Z87891 Personal history of nicotine dependence: Secondary | ICD-10-CM

## 2013-07-05 DIAGNOSIS — R55 Syncope and collapse: Principal | ICD-10-CM | POA: Diagnosis present

## 2013-07-05 DIAGNOSIS — F0390 Unspecified dementia without behavioral disturbance: Secondary | ICD-10-CM | POA: Diagnosis present

## 2013-07-05 DIAGNOSIS — E785 Hyperlipidemia, unspecified: Secondary | ICD-10-CM | POA: Diagnosis present

## 2013-07-05 DIAGNOSIS — I10 Essential (primary) hypertension: Secondary | ICD-10-CM | POA: Diagnosis present

## 2013-07-05 LAB — URINALYSIS, ROUTINE W REFLEX MICROSCOPIC
Bilirubin Urine: NEGATIVE
Glucose, UA: NEGATIVE mg/dL
Ketones, ur: NEGATIVE mg/dL
Leukocytes, UA: NEGATIVE
Protein, ur: 30 mg/dL — AB
pH: 6 (ref 5.0–8.0)

## 2013-07-05 LAB — COMPREHENSIVE METABOLIC PANEL
ALT: 19 U/L (ref 0–35)
AST: 25 U/L (ref 0–37)
BUN: 20 mg/dL (ref 6–23)
CO2: 24 mEq/L (ref 19–32)
Creatinine, Ser: 0.75 mg/dL (ref 0.50–1.10)
GFR calc non Af Amer: 75 mL/min — ABNORMAL LOW (ref >90)
Glucose, Bld: 131 mg/dL — ABNORMAL HIGH (ref 70–99)
Total Protein: 7.5 g/dL (ref 6.0–8.3)

## 2013-07-05 LAB — CK TOTAL AND CKMB (NOT AT ARMC)
CK, MB: 1.5 ng/mL (ref 0.3–4.0)
Relative Index: INVALID (ref 0.0–2.5)
Total CK: 59 U/L (ref 7–177)

## 2013-07-05 LAB — CBC
HCT: 40.4 % (ref 36.0–46.0)
MCHC: 34.4 g/dL (ref 30.0–36.0)
MCV: 110.1 fL — ABNORMAL HIGH (ref 78.0–100.0)
RBC: 3.67 MIL/uL — ABNORMAL LOW (ref 3.87–5.11)
RDW: 14.6 % (ref 11.5–15.5)

## 2013-07-05 LAB — PROTIME-INR: Prothrombin Time: 25 seconds — ABNORMAL HIGH (ref 11.6–15.2)

## 2013-07-05 LAB — URINE MICROSCOPIC-ADD ON

## 2013-07-05 MED ORDER — HYDROMORPHONE HCL PF 1 MG/ML IJ SOLN: 1.0000 mg | INTRAMUSCULAR | Status: DC | PRN

## 2013-07-05 MED ORDER — ATORVASTATIN CALCIUM 10 MG PO TABS
10.0000 mg | ORAL_TABLET | Freq: Every day | ORAL | Status: DC
  Administered 2013-07-05 – 2013-07-06 (×2): 10 mg via ORAL
  Filled 2013-07-05 (×3): qty 1

## 2013-07-05 MED ORDER — TRAZODONE HCL 50 MG PO TABS
50.0000 mg | ORAL_TABLET | Freq: Every evening | ORAL | Status: DC | PRN
  Administered 2013-07-05 – 2013-07-06 (×2): 50 mg via ORAL
  Filled 2013-07-05 (×3): qty 1

## 2013-07-05 MED ORDER — PANTOPRAZOLE SODIUM 40 MG PO TBEC
40.0000 mg | DELAYED_RELEASE_TABLET | Freq: Every day | ORAL | Status: DC
  Administered 2013-07-06 – 2013-07-07 (×2): 40 mg via ORAL
  Filled 2013-07-05 (×2): qty 1

## 2013-07-05 MED ORDER — SODIUM CHLORIDE 0.9 % IJ SOLN
3.0000 mL | Freq: Two times a day (BID) | INTRAMUSCULAR | Status: DC
  Administered 2013-07-05: 3 mL via INTRAVENOUS

## 2013-07-05 MED ORDER — ONDANSETRON HCL 4 MG/2ML IJ SOLN: 4.0000 mg | Freq: Four times a day (QID) | INTRAMUSCULAR | Status: DC | PRN

## 2013-07-05 MED ORDER — ACETAMINOPHEN 650 MG RE SUPP: 650.0000 mg | Freq: Four times a day (QID) | RECTAL | Status: DC | PRN

## 2013-07-05 MED ORDER — SODIUM CHLORIDE 0.9 % IV SOLN
INTRAVENOUS | Status: DC
  Administered 2013-07-05 – 2013-07-06 (×2): via INTRAVENOUS

## 2013-07-05 MED ORDER — CALCIUM CARBONATE-VITAMIN D 500-200 MG-UNIT PO TABS
1.0000 | ORAL_TABLET | Freq: Two times a day (BID) | ORAL | Status: DC
  Administered 2013-07-05 – 2013-07-07 (×4): 1 via ORAL
  Filled 2013-07-05 (×5): qty 1

## 2013-07-05 MED ORDER — ONDANSETRON HCL 4 MG PO TABS: 4.0000 mg | ORAL_TABLET | Freq: Four times a day (QID) | ORAL | Status: DC | PRN

## 2013-07-05 MED ORDER — OMEGA-3 FATTY ACIDS 1000 MG PO CAPS: 1.0000 g | ORAL_CAPSULE | Freq: Every day | ORAL | Status: DC

## 2013-07-05 MED ORDER — OMEGA-3-ACID ETHYL ESTERS 1 G PO CAPS
1.0000 g | ORAL_CAPSULE | Freq: Every day | ORAL | Status: DC
  Administered 2013-07-06 – 2013-07-07 (×2): 1 g via ORAL
  Filled 2013-07-05 (×2): qty 1

## 2013-07-05 MED ORDER — SODIUM CHLORIDE 0.9 % IV BOLUS (SEPSIS)
1000.0000 mL | Freq: Once | INTRAVENOUS | Status: AC
  Administered 2013-07-05: 1000 mL via INTRAVENOUS

## 2013-07-05 MED ORDER — AMLODIPINE BESYLATE 2.5 MG PO TABS
2.5000 mg | ORAL_TABLET | Freq: Every day | ORAL | Status: DC
  Administered 2013-07-06: 2.5 mg via ORAL
  Filled 2013-07-05: qty 1

## 2013-07-05 MED ORDER — WARFARIN - PHARMACIST DOSING INPATIENT: Freq: Every day | Status: DC

## 2013-07-05 MED ORDER — ACETAMINOPHEN 325 MG PO TABS: 650.0000 mg | ORAL_TABLET | Freq: Four times a day (QID) | ORAL | Status: DC | PRN

## 2013-07-05 MED ORDER — HYDROXYUREA 500 MG PO CAPS
1000.0000 mg | ORAL_CAPSULE | Freq: Every day | ORAL | Status: DC
  Administered 2013-07-06: 1000 mg via ORAL
  Filled 2013-07-05 (×2): qty 2

## 2013-07-05 MED ORDER — WARFARIN SODIUM 2.5 MG PO TABS
2.5000 mg | ORAL_TABLET | Freq: Once | ORAL | Status: DC
  Filled 2013-07-05: qty 1

## 2013-07-05 MED ORDER — DONEPEZIL HCL 10 MG PO TABS
10.0000 mg | ORAL_TABLET | Freq: Every day | ORAL | Status: DC
  Administered 2013-07-06: 10 mg via ORAL
  Filled 2013-07-05: qty 1

## 2013-07-05 MED ORDER — HYDROCODONE-ACETAMINOPHEN 5-325 MG PO TABS: 1.0000 | ORAL_TABLET | Freq: Four times a day (QID) | ORAL | Status: DC | PRN

## 2013-07-05 MED ORDER — LOSARTAN POTASSIUM 25 MG PO TABS
25.0000 mg | ORAL_TABLET | Freq: Every day | ORAL | Status: DC
  Administered 2013-07-06 – 2013-07-07 (×2): 25 mg via ORAL
  Filled 2013-07-05 (×2): qty 1

## 2013-07-05 MED ORDER — LEVOTHYROXINE SODIUM 88 MCG PO TABS
88.0000 ug | ORAL_TABLET | Freq: Every day | ORAL | Status: DC
  Administered 2013-07-06 – 2013-07-07 (×2): 88 ug via ORAL
  Filled 2013-07-05 (×3): qty 1

## 2013-07-05 MED ORDER — ADULT MULTIVITAMIN W/MINERALS CH
1.0000 | ORAL_TABLET | Freq: Every day | ORAL | Status: DC
  Administered 2013-07-06 – 2013-07-07 (×2): 1 via ORAL
  Filled 2013-07-05 (×2): qty 1

## 2013-07-05 NOTE — ED Notes (Signed)
Admitting MD at bedside.

## 2013-07-05 NOTE — Progress Notes (Signed)
ANTICOAGULATION CONSULT NOTE - Initial Consult  Pharmacy Consult for coumadin Indication: history of DVT  No Known Allergies   Vital Signs: Temp: 97.3 F (36.3 C) (11/07 1743) Temp src: Oral (11/07 1743) BP: 168/77 mmHg (11/07 1743) Pulse Rate: 79 (11/07 1743)  Labs:  Recent Labs  07/05/13 1415 07/05/13 1711  HGB 13.9  --   HCT 40.4  --   PLT 761*  --   LABPROT  --  25.0*  INR  --  2.36*  CREATININE 0.75  --     The CrCl is unknown because both a height and weight (above a minimum accepted value) are required for this calculation.   Medical History: Past Medical History  Diagnosis Date  . Hypertension   . Hyperlipidemia   . Thyroid disease     Hypothyroidism  . GERD (gastroesophageal reflux disease)   . Arthritis     Degenerative  . Peripheral vascular disease     Left   Fem-Pop and  tibial Thrombectomy  . High platelet count   . Dementia   . Pain in toe of right foot     Medications:  Prescriptions prior to admission  Medication Sig Dispense Refill  . amLODipine (NORVASC) 2.5 MG tablet Take 2.5 mg by mouth daily.      Marland Kitchen atorvastatin (LIPITOR) 10 MG tablet Take 10 mg by mouth daily.      . calcium-vitamin D (OSCAL WITH D) 500-200 MG-UNIT per tablet Take 1 tablet by mouth 2 (two) times daily.      Marland Kitchen donepezil (ARICEPT) 10 MG tablet Take 10 mg by mouth daily.       . fish oil-omega-3 fatty acids 1000 MG capsule Take 1 g by mouth daily.      . hydroxyurea (HYDREA) 500 MG capsule Take 1,000 mg by mouth daily. May take with food to minimize GI side effects.      Marland Kitchen levothyroxine (SYNTHROID, LEVOTHROID) 88 MCG tablet Take 88 mcg by mouth daily before breakfast.      . losartan (COZAAR) 25 MG tablet Take 25 mg by mouth daily.      . Multiple Vitamin (MULTIVITAMIN WITH MINERALS) TABS tablet Take 1 tablet by mouth daily.      . pantoprazole (PROTONIX) 40 MG tablet Take 40 mg by mouth daily.      . traZODone (DESYREL) 50 MG tablet Take 50 mg by mouth at bedtime as  needed for sleep.       Marland Kitchen warfarin (COUMADIN) 5 MG tablet Take 2.5-5 mg by mouth daily. Take 1 tablet daily except on Friday take 1/2 tablet       Scheduled:  . [START ON 07/06/2013] amLODipine  2.5 mg Oral Daily  . [START ON 07/06/2013] atorvastatin  10 mg Oral Daily  . calcium-vitamin D  1 tablet Oral BID  . [START ON 07/06/2013] donepezil  10 mg Oral Daily  . [START ON 07/06/2013] hydroxyurea  1,000 mg Oral Daily  . [START ON 07/06/2013] levothyroxine  88 mcg Oral QAC breakfast  . [START ON 07/06/2013] losartan  25 mg Oral Daily  . [START ON 07/06/2013] multivitamin with minerals  1 tablet Oral Daily  . [START ON 07/06/2013] omega-3 acid ethyl esters  1 g Oral Daily  . [START ON 07/06/2013] pantoprazole  40 mg Oral Daily  . sodium chloride  3 mL Intravenous Q12H    Assessment: 79 yo female here with syncopal episode and noted with history of PVD with thrombectomy. Patient is on coumadin  PTA and pharmacy has been consulted to continue therapy as inpatient. INR today is 2.36 and at goal.  Home coumadin dose: 5mg /day except take 2.5mg  on Fr (last dose 07/04/13)  Goal of Therapy:  INR 2-3 Monitor platelets by anticoagulation protocol: Yes   Plan:   -Coumadin 2.5mg  today -Daily PT/INR  Harland German, Pharm D 07/05/2013 6:26 PM

## 2013-07-05 NOTE — H&P (Addendum)
History and Physical       Hospital Admission Note Date: 07/05/2013  Patient name: Tanya Farmer Medical record number: [id number] Date of birth: March 13, 1926 Age: 79 y.o. Gender: female PCP: Lillia Mountain, MD  Primary cardiologist: Dr. Verdis Prime  Chief Complaint:  Syncopal episode  HPI: Patient is 79 year old female with history of hypertension, hyperlipidemia, hypothyroidism, pvd, thrombocytosis, history of prior bilateral iliac femoral popliteal and tibial thrombectomies due to hypercoagulable state, is on the chronic Coumadin, follows hematologist for essential thrombocytosis, on hydroxyurea, was brought to ED by her daughter for syncopal episode. Patient is a poor historian due to dementia, unable to provide any history herself. Per the daughter present in the room, she is with the patient 24/7 and also has a caregiver. Per the caregiver, patient was eating breakfast and slumped off from the chair. Per daughter, she had not slept well at night, may also have dozed off and fell. Patient regained consciousness quickly afterwards, she had no seizure activity, event lasted less than 2 minutes. Patient could not recall any symptoms of nausea, vomiting, chest pain, dizziness, lightheadedness or any palpitations prior to the event.  ER workup showed nonspecific T-wave changes on the EKG, no comparison EKG, chest x-ray showed no pneumonia, CT head showed no acute intracranial abnormality, showed atrophic with advanced chronic small vessel white matter ischemic changes. UA showed negative ketones.    Review of Systems:  Patient unable to provide review system herself due to dementia  Past Medical History: Past Medical History  Diagnosis Date  . Hypertension   . Hyperlipidemia   . Thyroid disease     Hypothyroidism  . GERD (gastroesophageal reflux disease)   . Arthritis     Degenerative  . Peripheral vascular disease     Left   Fem-Pop  and  tibial Thrombectomy  . High platelet count   . Dementia   . Pain in toe of right foot    Past Surgical History  Procedure Laterality Date  . Femoral-popliteal bypass graft  2002    Left  Dr. Tawanna Cooler Early  . Abdominal hysterectomy    . Cataract surgery      left eye    Medications: Prior to Admission medications   Medication Sig Start Date End Date Taking? Authorizing Provider  amLODipine (NORVASC) 2.5 MG tablet Take 2.5 mg by mouth daily.   Yes Historical Provider, MD  atorvastatin (LIPITOR) 10 MG tablet Take 10 mg by mouth daily.   Yes Historical Provider, MD  calcium-vitamin D (OSCAL WITH D) 500-200 MG-UNIT per tablet Take 1 tablet by mouth 2 (two) times daily.   Yes Historical Provider, MD  donepezil (ARICEPT) 10 MG tablet Take 10 mg by mouth daily.    Yes Historical Provider, MD  fish oil-omega-3 fatty acids 1000 MG capsule Take 1 g by mouth daily.   Yes Historical Provider, MD  hydroxyurea (HYDREA) 500 MG capsule Take 1,000 mg by mouth daily. May take with food to minimize GI side effects.   Yes Historical Provider, MD  levothyroxine (SYNTHROID, LEVOTHROID) 88 MCG tablet Take 88 mcg by mouth daily before breakfast.   Yes Historical Provider, MD  losartan (COZAAR) 25 MG tablet Take 25 mg by mouth daily.   Yes Historical Provider, MD  Multiple Vitamin (MULTIVITAMIN WITH MINERALS) TABS tablet Take 1 tablet by mouth daily.   Yes Historical Provider, MD  pantoprazole (PROTONIX) 40 MG tablet Take 40 mg by mouth daily.   Yes Historical Provider, MD  traZODone (DESYREL) 50 MG  tablet Take 50 mg by mouth at bedtime as needed for sleep.    Yes Historical Provider, MD  warfarin (COUMADIN) 5 MG tablet Take 2.5-5 mg by mouth daily. Take 1 tablet daily except on Friday take 1/2 tablet   Yes Historical Provider, MD    Allergies:  No Known Allergies  Social History: Per daughter, reports that she has quit smoking. She does not have any smokeless tobacco history on file. she does not drink  alcohol or use illicit drugs.lives at home, daughter checks up on her, has a caregiver, ambulates without any assistance  Family History: History reviewed. No pertinent family history.  Physical Exam: Blood pressure 153/112, pulse 74, temperature 98.7 F (37.1 C), temperature source Oral, resp. rate 18, SpO2 99.00%. General: Alert, awake, oriented  to self and person,  in no acute distress. HEENT: normocephalic, atraumatic, anicteric sclera, pink conjunctiva, pupils equal and reactive to light and accomodation, oropharynx clear Neck: supple, no masses or lymphadenopathy, no goiter, no bruits  Heart: Regular rate and rhythm, without murmurs, rubs or gallops. Lungs: Clear to auscultation bilaterally, no wheezing, rales or rhonchi. Abdomen: Soft, nontender, nondistended, positive bowel sounds, no masses. Extremities: No clubbing, cyanosis or edema with positive pedal pulses. Neuro: Grossly intact, no focal neurological deficits, strength 5/5 upper and lower extremities bilaterally Psych: alert and oriented x 3, normal mood and affect Skin: no rashes or lesions, warm and dry   LABS on Admission:  Basic Metabolic Panel:  Recent Labs Lab 07/05/13 1415  NA 141  K 3.7  CL 107  CO2 24  GLUCOSE 131*  BUN 20  CREATININE 0.75  CALCIUM 9.2   Liver Function Tests:  Recent Labs Lab 07/05/13 1415  AST 25  ALT 19  ALKPHOS 54  BILITOT 0.3  PROT 7.5  ALBUMIN 3.4*   No results found for this basename: LIPASE, AMYLASE,  in the last 168 hours No results found for this basename: AMMONIA,  in the last 168 hours CBC:  Recent Labs Lab 07/05/13 1415  WBC 4.5  HGB 13.9  HCT 40.4  MCV 110.1*  PLT 761*   Cardiac Enzymes: No results found for this basename: CKTOTAL, CKMB, CKMBINDEX, TROPONINI,  in the last 168 hours BNP: No components found with this basename: POCBNP,  CBG: No results found for this basename: GLUCAP,  in the last 168 hours   Radiological Exams on Admission: Dg  Chest 2 View  07/05/2013   CLINICAL DATA:  Syncope  EXAM: CHEST  2 VIEW  COMPARISON:  09/13/2010  FINDINGS: Cardiomegaly with central vascular congestion. Slightly lower lung volumes. No focal pneumonia, collapse or consolidation. No effusion or pneumothorax. Trachea midline. No osseous abnormality.  IMPRESSION: Cardiomegaly with vascular congestion   Electronically Signed   By: Ruel Favors M.D.   On: 07/05/2013 15:47   Ct Head Wo Contrast  07/05/2013   CLINICAL DATA:  Syncope  EXAM: CT HEAD WITHOUT CONTRAST  TECHNIQUE: Contiguous axial images were obtained from the base of the skull through the vertex without intravenous contrast.  COMPARISON:  07/29/2003  FINDINGS: There is no evidence for acute hemorrhage, hydrocephalus, mass lesion, or abnormal extra-axial fluid collection. No definite CT evidence for acute infarction. Diffuse loss of parenchymal volume is consistent with atrophy. Patchy low attenuation in the deep hemispheric and periventricular white matter is nonspecific, but likely reflects chronic microvascular ischemic demyelination.  Air-fluid level is seen in the right maxillary sinus. The remaining visualized paranasal sinuses are clear. No evidence for fluid in  the mastoid air cells  IMPRESSION: No acute intracranial abnormality.  Atrophy with advanced chronic small vessel white matter ischemic demyelination.  Air-fluid level in the right maxillary sinus. This may related to acute sinusitis although hemorrhage can have this appearance.   Electronically Signed   By: Kennith Center M.D.   On: 07/05/2013 16:03   EKG showed nonspecific T-wave changes, no prior EKG to compare with  Assessment/Plan Principal Problem:   Syncope with abnormal EKG changes - Admit to telemetry to rule out any arrhythmias, observation, rule out ACS, serial cardiac enzymes,  obtain B12, folate, TSH cortisol level - Obtain 2-D echocardiogram for further workup - Patient's daughter requested patient's cardiologist, Dr.  Verdis Prime to see her, called cardiology consult  Active Problems:   Peripheral vascular disease, unspecified with hypercoagulable state - Continue Coumadin per pharmacy    Essential thrombocytosis - Continue hydroxyurea  Hypertension: Currently stable continue outpatient antihypertensives    Dementia - Continue trazodone, Aricept    Hypothyroidism - Check TSH, continue Synthroid  DVT prophylaxis: On Coumadin  CODE STATUS: Full CODE STATUS  Family Communication: Admission, patients condition and plan of care including tests being ordered have been discussed with the patient's daughter and son, who indicates understanding and agree with the plan and Code Status. Left message for Dr. Valentina Lucks, will assume care in am.   Further plan will depend as patient's clinical course evolves and further radiologic and laboratory data become available.   Time Spent on Admission: 1 hour  Christian Treadway M.D. [facility] 07/05/2013, 5:24 PM Pager: [phone]  If 7PM-7AM, please contact night-coverage [email or web address] Password [email or web address]  Addendum: -Spoke with Dr. Mayford Knife (cardiology), agreed with the above workup, echo, tele, r/o ACS, recommended to call back if there are positive cardiac enzymes or any arrhythmias or change in cardiac status. Recommended formal consult by Dr. Valentina Lucks in AM if warranted.   Fermina Mishkin M.D. [facility] 07/05/2013, 5:58 PM  Pager: [phone]

## 2013-07-05 NOTE — Telephone Encounter (Signed)
Patient's labwork and CT of head are pending.

## 2013-07-05 NOTE — Telephone Encounter (Signed)
New message    Want you to know that she was taken to the hosp.  She was standing in the kitchen and fell out. Arimental want Riki Rusk to know this.

## 2013-07-05 NOTE — ED Notes (Signed)
Pt presents with syncopal episode at home.  Event was not witnessed, family heard pt fall.  Per EMS, pt at baseline on arrival.  CBG:  135, BP on scene: 98/60.

## 2013-07-05 NOTE — ED Provider Notes (Signed)
Date: 07/05/2013 @ 1403  Rate: 61  Rhythm: normal sinus rhythm  QRS Axis: left  Intervals: normal  ST/T Wave abnormalities: nonspecific ST changes  Conduction Disutrbances:none  Narrative Interpretation:   Old EKG Reviewed: none available  79 yo wf with h/o syncope.   [facility] with minimal findings on exam, but pt is on coumadin. Plan for CT head and further evaluation.  VSS.  Pt in NAD.  Dispo pending results.  Likely admission.      Tanya Gales, MD 07/05/13 1534

## 2013-07-05 NOTE — ED Provider Notes (Signed)
MSE was initiated and I personally evaluated the patient and placed orders (if any) at  2:38 PM on July 05, 2013.  Patient is an 79 -year-old female with past medical history of hypertension, hyperlipidemia, GERD, PVD, dementia and high platelet count who presents to the emergency department with her daughter and son-in-law after having a witnessed syncopal episode. Daughter states she was standing across the counter from the patient having a conversation when she passed out, hit her head, no loss of consciousness. She takes Coumadin. Despite triage summary, episode was witnessed. Patient is at baseline mentation her daughter. No recent illness, fever,, urinary or bowel changes. Workup in process- cbc, CMP, UA, lactic acid, troponin, CT head, chest x-ray, EKG. Orthostatic vital signs. Case discussed with attending Dr. Romeo Apple who agrees with plan of care.  The patient appears stable and in NAD so that the remainder of the MSE may be completed by another provider.  Trevor Mace, PA-C 07/05/13 1441

## 2013-07-05 NOTE — ED Provider Notes (Signed)
CSN: [id number]     Arrival date & time 07/05/13  1353 History   First MD Initiated Contact with Patient 07/05/13 1428     No chief complaint on file.  (Consider location/radiation/quality/duration/timing/severity/associated sxs/prior Treatment) HPI Pt here with son and daughter after syncopal episode that occurred at home. Pt was standing, fell and hit left eyebrow. Onset was sudden, event lasted about 2 minutes. Pt regained consciousness quickly afterward.  The pain is none. Pt has not complaints at this time. Modifying factors: none.  Associated symptoms: no chest pain, no SOB, no recent illness.  Recent medical care: none.   Past Medical History  Diagnosis Date  . Hypertension   . Hyperlipidemia   . Thyroid disease     Hypothyroidism  . GERD (gastroesophageal reflux disease)   . Arthritis     Degenerative  . Peripheral vascular disease     Left   Fem-Pop and  tibial Thrombectomy  . High platelet count   . Dementia   . Pain in toe of right foot    Past Surgical History  Procedure Laterality Date  . Femoral-popliteal bypass graft  2002    Left  Dr. Tawanna Cooler Early  . Abdominal hysterectomy    . Cataract surgery      left eye   History reviewed. No pertinent family history. History  Substance Use Topics  . Smoking status: Former Games developer  . Smokeless tobacco: Not on file  . Alcohol Use: No   OB History   Grav Para Term Preterm Abortions TAB SAB Ect Mult Living                 Review of Systems Constitutional: Negative for fever.  Eyes: Negative for vision loss.  ENT: Negative for difficulty swallowing.  Cardiovascular: Negative for chest pain. Respiratory: Negative for respiratory distress.  Gastrointestinal:  Negative for vomiting.  Genitourinary: Negative for inability to void.  Musculoskeletal: Negative for gait problem.  Integumentary: Negative for rash.  Neurological: Negative for new focal weakness.     Allergies  Review of patient's allergies indicates no  known allergies.  Home Medications   Current Outpatient Rx  Name  Route  Sig  Dispense  Refill  . amLODipine (NORVASC) 2.5 MG tablet   Oral   Take 2.5 mg by mouth daily.         Marland Kitchen atorvastatin (LIPITOR) 10 MG tablet   Oral   Take 10 mg by mouth daily.         . calcium-vitamin D (OSCAL WITH D) 500-200 MG-UNIT per tablet   Oral   Take 1 tablet by mouth 2 (two) times daily.         Marland Kitchen donepezil (ARICEPT) 10 MG tablet   Oral   Take 10 mg by mouth daily.          . fish oil-omega-3 fatty acids 1000 MG capsule   Oral   Take 1 g by mouth daily.         . hydroxyurea (HYDREA) 500 MG capsule   Oral   Take 1,000 mg by mouth daily. May take with food to minimize GI side effects.         Marland Kitchen levothyroxine (SYNTHROID, LEVOTHROID) 88 MCG tablet   Oral   Take 88 mcg by mouth daily before breakfast.         . losartan (COZAAR) 25 MG tablet   Oral   Take 25 mg by mouth daily.         Marland Kitchen  Multiple Vitamin (MULTIVITAMIN WITH MINERALS) TABS tablet   Oral   Take 1 tablet by mouth daily.         . pantoprazole (PROTONIX) 40 MG tablet   Oral   Take 40 mg by mouth daily.         . traZODone (DESYREL) 50 MG tablet   Oral   Take 50 mg by mouth at bedtime as needed for sleep.          Marland Kitchen warfarin (COUMADIN) 5 MG tablet   Oral   Take 2.5-5 mg by mouth daily. Take 1 tablet daily except on Friday take 1/2 tablet          BP 157/74  Pulse 69  Temp(Src) 98.7 F (37.1 C) (Oral)  Resp 22  SpO2 99% Physical Exam Nursing note and vitals reviewed.  Constitutional: Pt is alert and appears stated age. Eyes: No injection, no scleral icterus.  HENT: Mild swelling, small abrasion superior lateral to left eye on eyebrow, normal ears, nose midline, airway open without erythema or exudate.  Respiratory: No respiratory distress. Equal breathing bilaterally. Cardiovascular: Normal rate. Extremities warm and well perfused.  Abdomen: Soft, non-tender. MSK: Extremities are  atraumatic without deformity. No signs of leg edema.  Skin: No rash, no wounds.   Neuro: No motor nor sensory deficit. GCS 15.      ED Course  Procedures (including critical care time) Labs Review Labs Reviewed  CBC - Abnormal; Notable for the following:    RBC 3.67 (*)    MCV 110.1 (*)    MCH 37.9 (*)    Platelets 761 (*)    All other components within normal limits  COMPREHENSIVE METABOLIC PANEL - Abnormal; Notable for the following:    Glucose, Bld 131 (*)    Albumin 3.4 (*)    GFR calc non Af Amer 75 (*)    GFR calc Af Amer 87 (*)    All other components within normal limits  URINALYSIS, ROUTINE W REFLEX MICROSCOPIC - Abnormal; Notable for the following:    Protein, ur 30 (*)    All other components within normal limits  URINE MICROSCOPIC-ADD ON - Abnormal; Notable for the following:    Squamous Epithelial / LPF FEW (*)    Casts HYALINE CASTS (*)    All other components within normal limits  LACTIC ACID, PLASMA  PROTIME-INR  POCT I-STAT TROPONIN I   Imaging Review Dg Chest 2 View  07/05/2013   CLINICAL DATA:  Syncope  EXAM: CHEST  2 VIEW  COMPARISON:  09/13/2010  FINDINGS: Cardiomegaly with central vascular congestion. Slightly lower lung volumes. No focal pneumonia, collapse or consolidation. No effusion or pneumothorax. Trachea midline. No osseous abnormality.  IMPRESSION: Cardiomegaly with vascular congestion   Electronically Signed   By: Ruel Favors M.D.   On: 07/05/2013 15:47   Ct Head Wo Contrast  07/05/2013   CLINICAL DATA:  Syncope  EXAM: CT HEAD WITHOUT CONTRAST  TECHNIQUE: Contiguous axial images were obtained from the base of the skull through the vertex without intravenous contrast.  COMPARISON:  07/29/2003  FINDINGS: There is no evidence for acute hemorrhage, hydrocephalus, mass lesion, or abnormal extra-axial fluid collection. No definite CT evidence for acute infarction. Diffuse loss of parenchymal volume is consistent with atrophy. Patchy low attenuation  in the deep hemispheric and periventricular white matter is nonspecific, but likely reflects chronic microvascular ischemic demyelination.  Air-fluid level is seen in the right maxillary sinus. The remaining visualized paranasal sinuses are clear.  No evidence for fluid in the mastoid air cells  IMPRESSION: No acute intracranial abnormality.  Atrophy with advanced chronic small vessel white matter ischemic demyelination.  Air-fluid level in the right maxillary sinus. This may related to acute sinusitis although hemorrhage can have this appearance.   Electronically Signed   By: Kennith Center M.D.   On: 07/05/2013 16:03    EKG Interpretation   None       MDM   1. Syncope    79 y.o. female w/ PMHx of dementia, HTN, HL, DVT on coumadin presents after syncopal episode with signs of minor head trauma. Pt at baseline without complaint. Family concerned about episode and about possible injury. Work up initiated. Plan to f/u.   EKG with t wave chages. Nonspecific but will consider abnormal given no comparison EKG. CXR without PNA. CT head NAICA. CMP without renal fialure. CBC without anemia. Troponin neg. UA w/o infection. Lactic acid low. PT/INR pending. Pt stable on re-eval. Family updated. Informed of plan to admit. Medicine called.     I independently viewed, interpreted, and used in my medical decision making all ordered lab and imaging tests. Medical Decision Making discussed with ED attending Darlys Gales, MD      Charm Barges, MD 07/05/13 [phone]

## 2013-07-06 DIAGNOSIS — I379 Nonrheumatic pulmonary valve disorder, unspecified: Secondary | ICD-10-CM

## 2013-07-06 DIAGNOSIS — I10 Essential (primary) hypertension: Secondary | ICD-10-CM | POA: Diagnosis present

## 2013-07-06 LAB — TROPONIN I
Troponin I: <0.3 ng/mL (ref <0.30)
Troponin I: <0.3 ng/mL (ref <0.30)

## 2013-07-06 LAB — BASIC METABOLIC PANEL
CO2: 26 mEq/L (ref 19–32)
Chloride: 111 mEq/L (ref 96–112)
GFR calc non Af Amer: 74 mL/min — ABNORMAL LOW (ref >90)
Glucose, Bld: 85 mg/dL (ref 70–99)
Sodium: 145 mEq/L (ref 135–145)

## 2013-07-06 LAB — PROTIME-INR
INR: 2.15 — ABNORMAL HIGH (ref 0.00–1.49)
Prothrombin Time: 23.3 seconds — ABNORMAL HIGH (ref 11.6–15.2)

## 2013-07-06 LAB — CK TOTAL AND CKMB (NOT AT ARMC)
CK, MB: 1.4 ng/mL (ref 0.3–4.0)
Total CK: 59 U/L (ref 7–177)

## 2013-07-06 LAB — CBC
HCT: 36.5 % (ref 36.0–46.0)
Hemoglobin: 12.5 g/dL (ref 12.0–15.0)
MCV: 109 fL — ABNORMAL HIGH (ref 78.0–100.0)
Platelets: 654 10*3/uL — ABNORMAL HIGH (ref 150–400)
RBC: 3.35 MIL/uL — ABNORMAL LOW (ref 3.87–5.11)
WBC: 3.4 10*3/uL — ABNORMAL LOW (ref 4.0–10.5)

## 2013-07-06 LAB — FOLATE: Folate: >20 ng/mL

## 2013-07-06 LAB — TSH: TSH: 0.032 u[IU]/mL — ABNORMAL LOW (ref 0.350–4.500)

## 2013-07-06 MED ORDER — WARFARIN SODIUM 5 MG PO TABS
5.0000 mg | ORAL_TABLET | Freq: Once | ORAL | Status: AC
  Administered 2013-07-06: 5 mg via ORAL
  Filled 2013-07-06: qty 1

## 2013-07-06 MED ORDER — ENSURE COMPLETE PO LIQD
237.0000 mL | ORAL | Status: DC
  Administered 2013-07-06 – 2013-07-07 (×2): 237 mL via ORAL

## 2013-07-06 MED ORDER — DONEPEZIL HCL 10 MG PO TABS
10.0000 mg | ORAL_TABLET | Freq: Every day | ORAL | Status: DC
  Filled 2013-07-06: qty 1

## 2013-07-06 NOTE — Progress Notes (Signed)
Medication Review Requested   Pharmacy was asked to review patient's home medications per RN because daughter states pt has had low BP for awhile now, is sleepy during day, and has urinary urgency.   -Norvasc, donepezil, trazodone has low 1-2% side effect for urinary urgency. -Donepezil has 2% side effect for somnolence.  Recommend moving aricept to bedtime for best administration time, consider options for urinary urgency as listed meds has low side effect profile, and continue monitoring BP.  Anabel Bene, PharmD Clinical Pharmacist Resident Pager: [phone]

## 2013-07-06 NOTE — Progress Notes (Signed)
ANTICOAGULATION CONSULT NOTE - Initial Consult  Pharmacy Consult for coumadin Indication: history of DVT  No Known Allergies   Vital Signs: Temp: 97.8 F (36.6 C) (11/08 0636) Temp src: Oral (11/08 0636) BP: 167/69 mmHg (11/08 0636) Pulse Rate: 67 (11/08 0636)  Labs:  Recent Labs  07/05/13 1415 07/05/13 1711 07/05/13 2028 07/05/13 2345 07/06/13 0430  HGB 13.9  --   --   --  12.5  HCT 40.4  --   --   --  36.5  PLT 761*  --   --   --  654*  LABPROT  --  25.0*  --   --  23.3*  INR  --  2.36*  --   --  2.15*  CREATININE 0.75  --   --   --  0.77  CKTOTAL  --   --  59 59 50  CKMB  --   --  1.5 1.5 1.4  TROPONINI  --   --  <0.30 <0.30 <0.30    The CrCl is unknown because both a height and weight (above a minimum accepted value) are required for this calculation.   Medical History: Past Medical History  Diagnosis Date  . Hypertension   . Hyperlipidemia   . Thyroid disease     Hypothyroidism  . GERD (gastroesophageal reflux disease)   . Arthritis     Degenerative  . Peripheral vascular disease     Left   Fem-Pop and  tibial Thrombectomy  . High platelet count   . Dementia   . Pain in toe of right foot     Medications:  Prescriptions prior to admission  Medication Sig Dispense Refill  . amLODipine (NORVASC) 2.5 MG tablet Take 2.5 mg by mouth daily.      Marland Kitchen atorvastatin (LIPITOR) 10 MG tablet Take 10 mg by mouth daily.      . calcium-vitamin D (OSCAL WITH D) 500-200 MG-UNIT per tablet Take 1 tablet by mouth 2 (two) times daily.      Marland Kitchen donepezil (ARICEPT) 10 MG tablet Take 10 mg by mouth daily.       . fish oil-omega-3 fatty acids 1000 MG capsule Take 1 g by mouth daily.      . hydroxyurea (HYDREA) 500 MG capsule Take 1,000 mg by mouth daily. May take with food to minimize GI side effects.      Marland Kitchen levothyroxine (SYNTHROID, LEVOTHROID) 88 MCG tablet Take 88 mcg by mouth daily before breakfast.      . losartan (COZAAR) 25 MG tablet Take 25 mg by mouth daily.      .  Multiple Vitamin (MULTIVITAMIN WITH MINERALS) TABS tablet Take 1 tablet by mouth daily.      . pantoprazole (PROTONIX) 40 MG tablet Take 40 mg by mouth daily.      . traZODone (DESYREL) 50 MG tablet Take 50 mg by mouth at bedtime as needed for sleep.       Marland Kitchen warfarin (COUMADIN) 5 MG tablet Take 2.5-5 mg by mouth daily. Take 1 tablet daily except on Friday take 1/2 tablet       Scheduled:  . amLODipine  2.5 mg Oral Daily  . atorvastatin  10 mg Oral Daily  . calcium-vitamin D  1 tablet Oral BID  . donepezil  10 mg Oral Daily  . hydroxyurea  1,000 mg Oral Daily  . levothyroxine  88 mcg Oral QAC breakfast  . losartan  25 mg Oral Daily  . multivitamin with minerals  1  tablet Oral Daily  . omega-3 acid ethyl esters  1 g Oral Daily  . pantoprazole  40 mg Oral Daily  . sodium chloride  3 mL Intravenous Q12H  . warfarin  2.5 mg Oral ONCE-1800  . Warfarin - Pharmacist Dosing Inpatient   Does not apply q1800    Assessment: 79 yo F presented with syncopal episode and noted history of PVD with thrombectomy. Patient is on coumadin PTA and pharmacy has been consulted to continue therapy as inpatient. INR today is 2.15 and at goal.  However, pt did not receive dose yesterday (11/7), will inc dose for tonight.  Home coumadin dose: 5mg /day except take 2.5mg  on Fr (last dose 07/04/13)  Goal of Therapy:  INR 2-3 Monitor platelets by anticoagulation protocol: Yes   Plan:   -Coumadin 5 mg today -Daily PT/INR, monitor CBC   Anabel Bene, PharmD Clinical Pharmacist Resident Pager: [phone]    07/06/2013 8:57 AM

## 2013-07-06 NOTE — Progress Notes (Signed)
Subjective: No new complaints  Objective: Vital signs in last 24 hours: Temp:  [97.3 F (36.3 C)-98.7 F (37.1 C)] 97.8 F (36.6 C) (11/08 0636) Pulse Rate:  [60-79] 67 (11/08 0636) Resp:  [15-22] 18 (11/08 0636) BP: (105-168)/(55-112) 167/69 mmHg (11/08 0636) SpO2:  [98 %-100 %] 100 % (11/08 0636) Weight change:     Intake/Output from previous day: 11/07 0701 - 11/08 0700 In: 701.3 [I.V.:701.3] Out: -  Intake/Output this shift:    General appearance: alert and cooperative Resp: clear to auscultation bilaterally Cardio: regular rate and rhythm, S1, S2 normal, no murmur, click, rub or gallop Extremities: extremities normal, atraumatic, no cyanosis or edema  Lab Results:  Recent Labs  07/05/13 1415 07/06/13 0430  WBC 4.5 3.4*  HGB 13.9 12.5  HCT 40.4 36.5  PLT 761* 654*   BMET  Recent Labs  07/05/13 1415 07/06/13 0430  NA 141 145  K 3.7 3.6  CL 107 111  CO2 24 26  GLUCOSE 131* 85  BUN 20 14  CREATININE 0.75 0.77  CALCIUM 9.2 9.3    Studies/Results: Dg Chest 2 View  07/05/2013   CLINICAL DATA:  Syncope  EXAM: CHEST  2 VIEW  COMPARISON:  09/13/2010  FINDINGS: Cardiomegaly with central vascular congestion. Slightly lower lung volumes. No focal pneumonia, collapse or consolidation. No effusion or pneumothorax. Trachea midline. No osseous abnormality.  IMPRESSION: Cardiomegaly with vascular congestion   Electronically Signed   By: Ruel Favors M.D.   On: 07/05/2013 15:47   Ct Head Wo Contrast  07/05/2013   CLINICAL DATA:  Syncope  EXAM: CT HEAD WITHOUT CONTRAST  TECHNIQUE: Contiguous axial images were obtained from the base of the skull through the vertex without intravenous contrast.  COMPARISON:  07/29/2003  FINDINGS: There is no evidence for acute hemorrhage, hydrocephalus, mass lesion, or abnormal extra-axial fluid collection. No definite CT evidence for acute infarction. Diffuse loss of parenchymal volume is consistent with atrophy. Patchy low attenuation in  the deep hemispheric and periventricular white matter is nonspecific, but likely reflects chronic microvascular ischemic demyelination.  Air-fluid level is seen in the right maxillary sinus. The remaining visualized paranasal sinuses are clear. No evidence for fluid in the mastoid air cells  IMPRESSION: No acute intracranial abnormality.  Atrophy with advanced chronic small vessel white matter ischemic demyelination.  Air-fluid level in the right maxillary sinus. This may related to acute sinusitis although hemorrhage can have this appearance.   Electronically Signed   By: Kennith Center M.D.   On: 07/05/2013 16:03    Medications: I have reviewed the patient's current medications.  Assessment/Plan: Principal Problem:   Syncope  Etiology not obvious, BP was low at scene (98/60), has been normal in hospital, not orthostatic, I cannot find EKG, will reorder, echo ordered, cardiac enzymes negative, ask cardiology to see.  REcheck orthostatics, IVFs at 50 Active Problems:   Abnormal EKG  recheck   Essential thrombocytosis platelets 654K on  hydroxyurea   Dementia   Hypothyroidism   Essential hypertension, benign on  meds   Ask pt to see   LOS: 1 day   Jonnae Fonseca JOSEPH 07/06/2013, 9:15 AM

## 2013-07-06 NOTE — Consult Note (Signed)
ELECTROPHYSIOLOGY CONSULT NOTE  Patient ID: Tanya Farmer MRN: [id number], DOB/AGE: [birth year]   Admit date: 07/05/2013 Date of Consult: 07/06/2013  Primary Physician: Lillia Mountain, MD Primary Cardiologist: Verdis Prime, MD Reason for Consultation: Syncope  History of Present Illness Tanya Farmer is a 79 y.o. female with PVD, HTN, dyslipidemia, essential thrombocytosis on chronic Coumadin and dementia who presented with syncope yesterday. She is accompanied by her daughter who assists with history. While standing in the kitchen at home yesterday morning she "just started leaning to side and fell to the floor" assisted by her daughter and the aide. She did not call out for help. She did not complain of chest pain or shortness of breath. She did not report palpitations or dizziness. This has happened twice before but this was the most severe episode. Her daughter tells me they helped her to the chair however she remained lethargic / somnolent. She had no other symptoms. Her family called EMS and on their arrival she was hypotensive. She has been admitted for further evaluation. An echo has been done which reveals normal LVEF, mod TR, mod MR. CEs negative. Telemetry shows SR without arrhythmias.  Past Medical History Past Medical History  Diagnosis Date  . Hypertension   . Hyperlipidemia   . Thyroid disease     Hypothyroidism  . GERD (gastroesophageal reflux disease)   . Arthritis     Degenerative  . Peripheral vascular disease     Left   Fem-Pop and  tibial Thrombectomy  . High platelet count   . Dementia   . Pain in toe of right foot     Past Surgical History Past Surgical History  Procedure Laterality Date  . Femoral-popliteal bypass graft  2002    Left  Dr. Tawanna Cooler Early  . Abdominal hysterectomy    . Cataract surgery      left eye    Allergies/Intolerances No Known Allergies  Inpatient Medications . amLODipine  2.5 mg Oral Daily  . atorvastatin  10 mg Oral Daily  .  calcium-vitamin D  1 tablet Oral BID  . donepezil  10 mg Oral Daily  . feeding supplement (ENSURE COMPLETE)  237 mL Oral Q24H  . hydroxyurea  1,000 mg Oral Daily  . levothyroxine  88 mcg Oral QAC breakfast  . losartan  25 mg Oral Daily  . multivitamin with minerals  1 tablet Oral Daily  . omega-3 acid ethyl esters  1 g Oral Daily  . pantoprazole  40 mg Oral Daily  . sodium chloride  3 mL Intravenous Q12H  . warfarin  5 mg Oral ONCE-1800  . Warfarin - Pharmacist Dosing Inpatient   Does not apply q1800   . sodium chloride 50 mL/hr at 07/06/13 0932    Family History Positive for CAD   Social History Social History  . Marital Status: Divorced   Social History Main Topics  . Smoking status: Former Games developer  . Smokeless tobacco: Never Used  . Alcohol Use: No  . Drug Use: No   Review of Systems General: No chills, fever, night sweats or weight changes  Cardiovascular:  No chest pain, dyspnea on exertion, edema, orthopnea, palpitations, paroxysmal nocturnal dyspnea Dermatological: No rash, lesions or masses Respiratory: No cough, dyspnea Urologic: No hematuria, dysuria Abdominal: No nausea, vomiting, diarrhea, bright red blood per rectum, melena, or hematemesis Neurologic: No visual changes, weakness All other systems reviewed and are otherwise negative except as noted above.  Physical Exam Vitals: Blood pressure 117/63, pulse 73, temperature 97.8  F (36.6 C), temperature source Oral, resp. rate 18, SpO2 100.00%.  General: Well developed, well appearing, elderly 79 y.o. female in no acute distress. HEENT: Normocephalic, atraumatic. EOMs intact. Sclera nonicteric. Oropharynx clear.  Neck: Supple. No JVD. Lungs: Respirations regular and unlabored, CTA bilaterally. No wheezes, rales or rhonchi. Heart: RRR. S1, S2 present. No murmurs, rub, S3 or S4. Abdomen: Soft, non-tender, non-distended. BS present x 4 quadrants. No hepatosplenomegaly.  Extremities: No clubbing, cyanosis or  edema. DP/PT/Radials 2+ and equal bilaterally. Psych: Normal affect. Neuro: Alert and oriented to person and time. Moves all extremities spontaneously. Musculoskeletal: No kyphosis. Skin: Intact. Warm and dry. No rashes or petechiae in exposed areas.   Labs  Recent Labs  07/05/13 2028 07/05/13 2345 07/06/13 0430  CKTOTAL 59 59 50  CKMB 1.5 1.5 1.4  TROPONINI <0.30 <0.30 <0.30   Lab Results  Component Value Date   WBC 3.4* 07/06/2013   HGB 12.5 07/06/2013   HCT 36.5 07/06/2013   MCV 109.0* 07/06/2013   PLT 654* 07/06/2013    Recent Labs Lab 07/05/13 1415 07/06/13 0430  NA 141 145  K 3.7 3.6  CL 107 111  CO2 24 26  BUN 20 14  CREATININE 0.75 0.77  CALCIUM 9.2 9.3  PROT 7.5  --   BILITOT 0.3  --   ALKPHOS 54  --   ALT 19  --   AST 25  --   GLUCOSE 131* 85   Lab Results  Component Value Date   DDIMER 0.42 07/05/2013    Recent Labs  07/05/13 2028  TSH 0.032*    Recent Labs  07/05/13 2028  VITAMINB12 913*  FOLATE >20.0    Recent Labs  07/06/13 0430  INR 2.15*    Radiology/Studies Dg Chest 2 View 07/05/2013    IMPRESSION: Cardiomegaly with vascular congestion   Electronically Signed   By: Ruel Favors M.D.   On: 07/05/2013 15:47   Ct Head Wo Contrast 07/05/2013    IMPRESSION: No acute intracranial abnormality.  Atrophy with advanced chronic small vessel white matter ischemic demyelination.  Air-fluid level in the right maxillary sinus. This may related to acute sinusitis although hemorrhage can have this appearance.  Electronically Signed   By: Kennith Center M.D.   On: 07/05/2013 16:03   Echocardiogram  Study Conclusions - Left ventricle: The cavity size was normal. Wall thickness was normal. Systolic function was normal. The estimated ejection fraction was in the range of 60% to 65%. Wall motion was normal; there were no regional wall motion abnormalities. - Aortic valve: Trivial regurgitation. - Tricuspid valve: Moderate regurgitation. -  Pulmonic valve: Moderate regurgitation.  12-lead ECG - SR with normal intervals; no acute ST-T wave abnormalities Telemetry - on telemetry she has normal sinus rhythm. She has a couple episodes of atrial tachycardia. She has no malignant arrhythmias noted.  Assessment and Plan  Attending Note:   The patient was seen and examined.  Agree with assessment and plan as noted above.  Changes made to the above note as needed.  Pt is pleasantly demented. Despite this, she is fairly active and ambulates around her house without difficulty.  She said 2 episodes of syncope over the past year.  Her echocardiogram is essentially normal. She has normal left ventricular systolic function. She does have some moderate tricuspid regurgitation and pulmonic insufficiency which are not a factor in the episode of syncope.  His quite possible that she's having orthostatic hypotension.  Her blood pressure is fairly well  controlled but at her age it would not be uncommon for have orthostatic hypotension. We will discontinue the amlodipine.  I would rather her have a slightly elevated blood pressure at times than at a blood pressure that is so low that it causes her to have syncope. She is quite mobile and she would be at risk for hip fracture she fell.  Will also place a 30 day event monitor on her upon discharge. She may be having episodes of sinus arrhythmia or sinus pauses as an explanation for her syncope.  She continues on anticoagulation with Coumadin. Despite her risk of falls, I think it we should probably continue with anticoagulation for now. She has a history of extensive thrombosis in the past.    She'll probably be up to go home tomorrow. She'll followup and see Dr. Verdis Prime in the office in the next month or so.  Vesta Mixer, Montez Hageman., MD, [facility] 07/06/2013, 3:24 PM

## 2013-07-06 NOTE — Progress Notes (Signed)
  Echocardiogram 2D Echocardiogram has been performed.  Cathie Beams 07/06/2013, 12:15 PM

## 2013-07-06 NOTE — ED Provider Notes (Signed)
Medical screening examination/treatment/procedure(s) were performed by non-physician practitioner and as supervising physician I was immediately available for consultation/collaboration.  EKG Interpretation     Ventricular Rate:    PR Interval:    QRS Duration:   QT Interval:    QTC Calculation:   R Axis:     Text Interpretation:                Junius Argyle, MD 07/06/13 1210

## 2013-07-06 NOTE — Evaluation (Signed)
Physical Therapy One Time-Evaluation/Discharge Patient Details Name: Tanya Farmer MRN: [id number] DOB: August 06, 1926 Today's Date: 07/06/2013 Time: 1478-2956 PT Time Calculation (min): 54 min  PT Assessment / Plan / Recommendation History of Present Illness  79 y.o. female admitted to [facility] on 07/05/13 with sycopal episode and fall.  She has history of hypertension, hyperlipidemia, hypothyroidism, pvd, thrombocytosis, history of prior bilateral iliac femoral popliteal and tibial thrombectomies due to hypercoagulable state, is on the chronic Coumadin, follows hematologist for essential thrombocytosis, on hydroxyurea, and has dementia.    Clinical Impression  Pt presents at her baseline level of mobility.  Daughter is worried that she may pass out on the stairs, but she is still physically able to go up and down the stairs holding the right handrail without difficulty.  Pt is at her baseline level of function and has no further acute or f/u PT needs at this time.  I encouraged her aide (private pay from home) to go on walks with her while she is here in the hospital.  Ortho static vitals were taken and were (-).  PT to sign off.      PT Assessment  Patent does not need any further PT services    Follow Up Recommendations  No PT follow up;Supervision/Assistance - 24 hour    Does the patient have the potential to tolerate intense rehabilitation     NA  Barriers to Discharge   None      Equipment Recommendations  None recommended by PT    Recommendations for Other Services   None  Frequency   NA- one time eval and d/c   Precautions / Restrictions   None  Pertinent Vitals/Pain Orthostatic BP: Supine 129/78  Sitting 119/56  Standing 117/63         Mobility  Bed Mobility Bed Mobility: Supine to Sit;Sitting - Scoot to Edge of Bed Supine to Sit: 4: Min guard;HOB flat Sitting - Scoot to [place] of Bed: 4: Min guard Details for Bed Mobility Assistance: min hand held assist to pull to standing.   Transfers Transfers: Sit to Stand;Stand to Sit Sit to Stand: 5: Supervision;From bed;From toilet;From chair/3-in-1 Stand to Sit: 5: Supervision;To bed;To chair/3-in-1;To toilet Details for Transfer Assistance: supervision for safety Ambulation/Gait Ambulation/Gait Assistance: 5: Supervision Ambulation Distance (Feet): 1000 Feet Assistive device: None Ambulation/Gait Assistance Details: supervision for safety.  Per daughter report she normally walks faster.  When cued to walk faster pt could speed up.   Gait Pattern: Within Functional Limits Gait velocity: decreased Stairs: Yes Stairs Assistance: 5: Supervision Stairs Assistance Details (indicate cue type and reason): supervision for safety Stair Management Technique: One rail Right;Forwards;Alternating pattern;Step to pattern (alternating up, step to down) Number of Stairs: 19        PT Goals(Current goals can be found in the care plan section) Acute Rehab PT Goals PT Goal Formulation: No goals set, d/c therapy  Visit Information  Last PT Received On: 07/06/13 Assistance Needed: +1 History of Present Illness: 79 y.o. female admitted to [facility] on 07/05/13 with sycopal episode and fall.  She has history of hypertension, hyperlipidemia, hypothyroidism, pvd, thrombocytosis, history of prior bilateral iliac femoral popliteal and tibial thrombectomies due to hypercoagulable state, is on the chronic Coumadin, follows hematologist for essential thrombocytosis, on hydroxyurea, and has dementia.         Prior Functioning  Home Living Family/patient expects to be discharged to:: Private residence Living Arrangements: Children Available Help at Discharge: Family;Personal care attendant;Available 24 hours/day Type of Home: House  Home Layout: Two level;Bed/bath upstairs Alternate Level Stairs-Number of Steps: 18 Alternate Level Stairs-Rails: Right Prior Function Level of Independence: Needs assistance Gait / Transfers Assistance Needed:  independent, does not use an assistive device, does use the railing on the stairs ADL's / Homemaking Assistance Needed: min assist needed for ADLs, someone else does the housework.  Communication / Swallowing Assistance Needed: HOH Comments: Supervision needed due to dementia for safety Communication Communication: HOH    Cognition  Cognition Arousal/Alertness: Awake/alert Behavior During Therapy: WFL for tasks assessed/performed Overall Cognitive Status: History of cognitive impairments - at baseline    Extremity/Trunk Assessment Upper Extremity Assessment Upper Extremity Assessment: Overall WFL for tasks assessed Lower Extremity Assessment Lower Extremity Assessment: Overall WFL for tasks assessed Cervical / Trunk Assessment Cervical / Trunk Assessment: Normal   Balance Balance Balance Assessed: Yes Static Sitting Balance Static Sitting - Balance Support: No upper extremity supported;Feet supported Static Sitting - Level of Assistance: 7: Independent Dynamic Sitting Balance Dynamic Sitting - Balance Support: Right upper extremity supported;Feet supported Dynamic Sitting - Level of Assistance: 6: Modified independent (Device/Increase time) Dynamic Sitting - Comments: one upper extremity supported on bed while preforming dynamic sitting tasks of pulling up her socks Static Standing Balance Static Standing - Balance Support: No upper extremity supported Static Standing - Level of Assistance: 5: Stand by assistance Dynamic Standing Balance Dynamic Standing - Balance Support: No upper extremity supported Dynamic Standing - Level of Assistance: 5: Stand by assistance  End of Session PT - End of Session Activity Tolerance: Patient tolerated treatment well Patient left: in bed;with call bell/phone within reach;Other (comment);with nursing/sitter in room (with 2D ECHO tech coming in the room)  GP Functional Assessment Tool Used: assist level Functional Limitation: Mobility: Walking  and moving around Mobility: Walking and Moving Around Current Status ([id number]): At least 1 percent but less than 20 percent impaired, limited or restricted Mobility: Walking and Moving Around Goal Status [phone]): At least 1 percent but less than 20 percent impaired, limited or restricted Mobility: Walking and Moving Around Discharge Status [phone]): At least 1 percent but less than 20 percent impaired, limited or restricted   Torrin Frein B. Kavari Parrillo, PT, DPT [phone]   07/06/2013, 12:10 PM

## 2013-07-06 NOTE — ED Provider Notes (Signed)
I saw and evaluated the patient, reviewed the resident's note and I agree with the findings and plan.  EKG Interpretation     Ventricular Rate:    PR Interval:    QRS Duration:   QT Interval:    QTC Calculation:   R Axis:     Text Interpretation:              See my additional note.  Admit for syncope.  VSS.  No issues during ER stay.    Darlys Gales, MD 07/06/13 [phone]

## 2013-07-07 LAB — URINE CULTURE: Culture: NO GROWTH

## 2013-07-07 LAB — BASIC METABOLIC PANEL
BUN: 14 mg/dL (ref 6–23)
CO2: 25 mEq/L (ref 19–32)
Creatinine, Ser: 0.75 mg/dL (ref 0.50–1.10)
GFR calc non Af Amer: 75 mL/min — ABNORMAL LOW (ref >90)
Glucose, Bld: 93 mg/dL (ref 70–99)
Potassium: 3.5 mEq/L (ref 3.5–5.1)
Sodium: 143 mEq/L (ref 135–145)

## 2013-07-07 LAB — PROTIME-INR
INR: 2.37 — ABNORMAL HIGH (ref 0.00–1.49)
Prothrombin Time: 25.1 seconds — ABNORMAL HIGH (ref 11.6–15.2)

## 2013-07-07 MED ORDER — LEVOTHYROXINE SODIUM 75 MCG PO TABS: 75.0000 ug | ORAL_TABLET | Freq: Every day | ORAL | Status: DC

## 2013-07-07 MED ORDER — LEVOTHYROXINE SODIUM 50 MCG PO TABS
50.0000 ug | ORAL_TABLET | Freq: Every day | ORAL | Status: DC
  Filled 2013-07-07: qty 1

## 2013-07-07 MED ORDER — WARFARIN SODIUM 5 MG PO TABS
5.0000 mg | ORAL_TABLET | Freq: Once | ORAL | Status: DC
  Filled 2013-07-07: qty 1

## 2013-07-07 NOTE — Progress Notes (Signed)
DC orders received.  Patient stable with no S/S of distress.  Medication and discharge information reviewed with patient and patient's daughter.  Patient DC home with daughter/son-in-law. [place], Mitzi Hansen

## 2013-07-07 NOTE — Progress Notes (Signed)
ANTICOAGULATION CONSULT NOTE - Follow up Consult  Pharmacy Consult for coumadin Indication: history of DVT  No Known Allergies   Vital Signs: Temp: 98 F (36.7 C) (11/09 0500) BP: 144/71 mmHg (11/09 0500) Pulse Rate: 75 (11/09 0500)  Labs:  Recent Labs  07/05/13 1415 07/05/13 1711 07/05/13 2028 07/05/13 2345 07/06/13 0430 07/07/13 0523  HGB 13.9  --   --   --  12.5  --   HCT 40.4  --   --   --  36.5  --   PLT 761*  --   --   --  654*  --   LABPROT  --  25.0*  --   --  23.3* 25.1*  INR  --  2.36*  --   --  2.15* 2.37*  CREATININE 0.75  --   --   --  0.77 0.75  CKTOTAL  --   --  59 59 50  --   CKMB  --   --  1.5 1.5 1.4  --   TROPONINI  --   --  <0.30 <0.30 <0.30  --     The CrCl is unknown because both a height and weight (above a minimum accepted value) are required for this calculation.   Medical History: Past Medical History  Diagnosis Date  . Hypertension   . Hyperlipidemia   . Thyroid disease     Hypothyroidism  . GERD (gastroesophageal reflux disease)   . Arthritis     Degenerative  . Peripheral vascular disease     Left   Fem-Pop and  tibial Thrombectomy  . High platelet count   . Dementia   . Pain in toe of right foot     Medications:  Prescriptions prior to admission  Medication Sig Dispense Refill  . amLODipine (NORVASC) 2.5 MG tablet Take 2.5 mg by mouth daily.      Marland Kitchen atorvastatin (LIPITOR) 10 MG tablet Take 10 mg by mouth daily.      . calcium-vitamin D (OSCAL WITH D) 500-200 MG-UNIT per tablet Take 1 tablet by mouth 2 (two) times daily.      Marland Kitchen donepezil (ARICEPT) 10 MG tablet Take 10 mg by mouth daily.       . fish oil-omega-3 fatty acids 1000 MG capsule Take 1 g by mouth daily.      . hydroxyurea (HYDREA) 500 MG capsule Take 1,000 mg by mouth daily. May take with food to minimize GI side effects.      Marland Kitchen levothyroxine (SYNTHROID, LEVOTHROID) 88 MCG tablet Take 88 mcg by mouth daily before breakfast.      . losartan (COZAAR) 25 MG tablet  Take 25 mg by mouth daily.      . Multiple Vitamin (MULTIVITAMIN WITH MINERALS) TABS tablet Take 1 tablet by mouth daily.      . pantoprazole (PROTONIX) 40 MG tablet Take 40 mg by mouth daily.      . traZODone (DESYREL) 50 MG tablet Take 50 mg by mouth at bedtime as needed for sleep.       Marland Kitchen warfarin (COUMADIN) 5 MG tablet Take 2.5-5 mg by mouth daily. Take 1 tablet daily except on Friday take 1/2 tablet       Scheduled:  . atorvastatin  10 mg Oral Daily  . calcium-vitamin D  1 tablet Oral BID  . donepezil  10 mg Oral QHS  . feeding supplement (ENSURE COMPLETE)  237 mL Oral Q24H  . hydroxyurea  1,000 mg Oral Daily  .  levothyroxine  88 mcg Oral QAC breakfast  . losartan  25 mg Oral Daily  . multivitamin with minerals  1 tablet Oral Daily  . omega-3 acid ethyl esters  1 g Oral Daily  . pantoprazole  40 mg Oral Daily  . sodium chloride  3 mL Intravenous Q12H  . Warfarin - Pharmacist Dosing Inpatient   Does not apply q1800    Assessment: 79 yo F presented with syncopal episode and noted history of PVD with thrombectomy. Patient is on coumadin PTA and pharmacy has been consulted to continue therapy as inpatient. INR today is 2.37 and therapeutic.  Will continue home PTA dosing for now.   Home coumadin dose: 5mg /day except take 2.5mg  on Fri  Goal of Therapy:  INR 2-3 Monitor platelets by anticoagulation protocol: Yes   Plan:   -Coumadin 5 mg today -Daily PT/INR, monitor CBC   Anabel Bene, PharmD Clinical Pharmacist Resident Pager: [phone]    07/07/2013 7:35 AM

## 2013-07-07 NOTE — Progress Notes (Signed)
Patient ID: Tanya Farmer MRN: [id number], DOB/AGE: 1926-06-12   Admit date: 07/05/2013 Date of Consult: 07/07/2013  Primary Physician: Lillia Mountain, MD Primary Cardiologist: Verdis Prime, MD Reason for Consultation: Syncope  History of Present Illness Tanya Farmer is a 79 y.o. female with PVD, HTN, dyslipidemia, essential thrombocytosis on chronic Coumadin and dementia who presented with syncope yesterday. She is accompanied by her daughter who assists with history. While standing in the kitchen at home yesterday morning she "just started leaning to side and fell to the floor" assisted by her daughter and the aide. She did not call out for help. She did not complain of chest pain or shortness of breath. She did not report palpitations or dizziness. This has happened twice before but this was the most severe episode. Her daughter tells me they helped her to the chair however she remained lethargic / somnolent. She had no other symptoms. Her family called EMS and on their arrival she was hypotensive. She has been admitted for further evaluation. An echo has been done which reveals normal LVEF, mod TR, mod MR. CEs negative. Telemetry shows SR without arrhythmias.  Past Medical History Past Medical History  Diagnosis Date  . Hypertension   . Hyperlipidemia   . Thyroid disease     Hypothyroidism  . GERD (gastroesophageal reflux disease)   . Arthritis     Degenerative  . Peripheral vascular disease     Left   Fem-Pop and  tibial Thrombectomy  . High platelet count   . Dementia   . Pain in toe of right foot     Past Surgical History Past Surgical History  Procedure Laterality Date  . Femoral-popliteal bypass graft  2002    Left  Dr. Tawanna Cooler Early  . Abdominal hysterectomy    . Cataract surgery      left eye    Allergies/Intolerances No Known Allergies  Inpatient Medications . atorvastatin  10 mg Oral Daily  . calcium-vitamin D  1 tablet Oral BID  . donepezil  10 mg Oral QHS  .  feeding supplement (ENSURE COMPLETE)  237 mL Oral Q24H  . hydroxyurea  1,000 mg Oral Daily  . levothyroxine  88 mcg Oral QAC breakfast  . losartan  25 mg Oral Daily  . multivitamin with minerals  1 tablet Oral Daily  . omega-3 acid ethyl esters  1 g Oral Daily  . pantoprazole  40 mg Oral Daily  . sodium chloride  3 mL Intravenous Q12H  . warfarin  5 mg Oral ONCE-1800  . Warfarin - Pharmacist Dosing Inpatient   Does not apply q1800   . sodium chloride 50 mL/hr at 07/06/13 2150    Family History Positive for CAD   Social History Social History  . Marital Status: Divorced   Social History Main Topics  . Smoking status: Former Games developer  . Smokeless tobacco: Never Used  . Alcohol Use: No  . Drug Use: No    Physical Exam Vitals: Blood pressure 144/71, pulse 75, temperature 98 F (36.7 C), temperature source Oral, resp. rate 16, SpO2 99.00%.  General: Well developed, well appearing, elderly 79 y.o. female in no acute distress. HEENT: Normocephalic, atraumatic. EOMs intact. Sclera nonicteric. Oropharynx clear.  Neck: Supple. No JVD. Lungs: Respirations regular and unlabored, CTA bilaterally. No wheezes, rales or rhonchi. Heart: RRR. S1, S2 present. No murmurs, rub, S3 or S4. Abdomen: Soft, non-tender, non-distended. + BS . No hepatosplenomegaly.  Extremities: No clubbing, cyanosis or edema. DP/PT/Radials 2+ and equal  bilaterally. Psych: Normal affect. Neuro: Alert and oriented to person and time. Moves all extremities spontaneously. Musculoskeletal: No kyphosis. Skin: Intact. Warm and dry. No rashes or petechiae in exposed areas.   Labs  Recent Labs  07/05/13 2028 07/05/13 2345 07/06/13 0430  CKTOTAL 59 59 50  CKMB 1.5 1.5 1.4  TROPONINI <0.30 <0.30 <0.30   Lab Results  Component Value Date   WBC 3.4* 07/06/2013   HGB 12.5 07/06/2013   HCT 36.5 07/06/2013   MCV 109.0* 07/06/2013   PLT 654* 07/06/2013    Recent Labs Lab 07/05/13 1415  07/07/13 0523  NA 141  < > 143    K 3.7  < > 3.5  CL 107  < > 109  CO2 24  < > 25  BUN 20  < > 14  CREATININE 0.75  < > 0.75  CALCIUM 9.2  < > 8.7  PROT 7.5  --   --   BILITOT 0.3  --   --   ALKPHOS 54  --   --   ALT 19  --   --   AST 25  --   --   GLUCOSE 131*  < > 93  < > = values in this interval not displayed. Lab Results  Component Value Date   DDIMER 0.42 07/05/2013    Recent Labs  07/05/13 2028  TSH 0.032*    Recent Labs  07/05/13 2028  VITAMINB12 913*  FOLATE >20.0    Recent Labs  07/07/13 0523  INR 2.37*    Radiology/Studies Dg Chest 2 View 07/05/2013    IMPRESSION: Cardiomegaly with vascular congestion   Electronically Signed   By: Ruel Favors M.D.   On: 07/05/2013 15:47   Ct Head Wo Contrast 07/05/2013    IMPRESSION: No acute intracranial abnormality.  Atrophy with advanced chronic small vessel white matter ischemic demyelination.  Air-fluid level in the right maxillary sinus. This may related to acute sinusitis although hemorrhage can have this appearance.  Electronically Signed   By: Kennith Center M.D.   On: 07/05/2013 16:03   Echocardiogram  Study Conclusions - Left ventricle: The cavity size was normal. Wall thickness was normal. Systolic function was normal. The estimated ejection fraction was in the range of 60% to 65%. Wall motion was normal; there were no regional wall motion abnormalities. - Aortic valve: Trivial regurgitation. - Tricuspid valve: Moderate regurgitation. - Pulmonic valve: Moderate regurgitation.  12-lead ECG - SR with normal intervals; no acute ST-T wave abnormalities Telemetry - on telemetry she has normal sinus rhythm. She has a couple episodes of atrial tachycardia. She has no malignant arrhythmias noted.  Assessment and Plan  1. Syncope:  Pt is pleasantly demented. Despite this, she is fairly active and ambulates around her house without difficulty. She said 2 episodes of syncope over the past year. Her echocardiogram is essentially normal. She  has normal left ventricular systolic function. She does have some moderate tricuspid regurgitation and pulmonic insufficiency which are not a factor in the episode of syncope.  His quite possible that she's having orthostatic hypotension.  Her blood pressure is fairly well controlled but at her age it would not be uncommon for have orthostatic hypotension. We will discontinue the amlodipine.  I would rather her have a slightly elevated blood pressure at times than at a blood pressure that is so low that it causes her to have syncope. She is quite mobile and she would be at risk for hip fracture she fell.  Will also place a 30 day event monitor on her upon discharge. She may be having episodes of sinus arrhythmia or sinus pauses as an explanation for her syncope.  She continues on anticoagulation with Coumadin. Despite her risk of falls, I think it we should probably continue with anticoagulation for now. She has a history of extensive thrombosis in the past.   She'll probably be up to go home tomorrow. She'll followup and see Dr. Verdis Prime in the office in the next month or so.  2. Hypothyroidism:  Her TSH is 0.032 which suggests that her synthroid dose is too strong.   Will lower her dose to 50 mcg a day.   Will hold her am dose of synthroid today.   She will follow up with Dr. Valentina Lucks.   Vesta Mixer, Montez Hageman., MD, [facility] 07/07/2013, 8:41 AM

## 2013-07-07 NOTE — Discharge Summary (Signed)
Physician Discharge Summary  Patient ID: Tanya Farmer MRN: [id number] DOB/AGE: 02/16/1926 79 y.o.  Admit date: 07/05/2013 Discharge date: 07/07/2013  Admission Diagnoses: Syncope Essential thrombocytosis Dementia Hypothyroidism Essential hypertension, benign  Discharge Diagnoses:  Principal Problem:   Syncope Active Problems:   Abnormal EKG   Essential thrombocytosis   Dementia   Hypothyroidism   Essential hypertension, benign   Discharged Condition: good  Hospital Course: The patient was admitted on November 7. She had been standing in the kitchen he when she suddenly fell to the left and her head on the floor. She regained consciousness quickly afterwards, no seizure activity, that lasted less than 2 minutes. There was no nausea, vomiting, chest pain, palpitations, shortness of breath and no prodromal symptoms. EKG showed nonspecific T wave changes. CT scan of the brain showed no acute intracranial abnormality just atrophy and chronic small vessel disease. The patient was admitted and placed on telemetry which remained unremarkable. Orthostatics were checked twice and were negative. She was seen by the cardiology service. Echocardiogram showed normal left ventricular size and estimated ejection fraction 60-65%, moderate tricuspid and pulmonary regurgitation. Cardiology assessment was possible orthostatic hypotension and amlodipine was discontinued. Losartan was continued at a low dose. A 30 day event monitor will be placed as an outpatient. The patient was continued on Coumadin. Her TSH was suppressed at 0.03 and her levothyroxine dose was decreased  Consults: cardiology  Significant Diagnostic Studies: labs: At discharge sodium 142 potassium 3.5 chloride 109 bicarbonate 25 glucose 93, BUN 14 creatinine 0.75, radiology: CXR: cardiomegaly and CT scan: Atrophy and small vessel disease, nonacute and cardiac graphics: Echocardiogram: As above  Treatments: IV hydration  Discharge  Exam: Blood pressure 132/64, pulse 66, temperature 98 F (36.7 C), temperature source Oral, resp. rate 16, SpO2 99.00%. General appearance: alert and cooperative Resp: clear to auscultation bilaterally Cardio: regular rate and rhythm, S1, S2 normal, no murmur, click, rub or gallop  Disposition:    Future Appointments Provider Department Dept Phone   07/11/2013 2:15 PM [facility] [place] Office [phone]   07/22/2013 2:15 PM Lesleigh Noe, MD [facility] [phone]       Medication List    STOP taking these medications       amLODipine 2.5 MG tablet  Commonly known as:  NORVASC      TAKE these medications       atorvastatin 10 MG tablet  Commonly known as:  LIPITOR  Take 10 mg by mouth daily.     calcium-vitamin D 500-200 MG-UNIT per tablet  Commonly known as:  OSCAL WITH D  Take 1 tablet by mouth 2 (two) times daily.     donepezil 10 MG tablet  Commonly known as:  ARICEPT  Take 10 mg by mouth daily.     fish oil-omega-3 fatty acids 1000 MG capsule  Take 1 g by mouth daily.     hydroxyurea 500 MG capsule  Commonly known as:  HYDREA  Take 1,000 mg by mouth daily. May take with food to minimize GI side effects.     levothyroxine 75 MCG tablet  Commonly known as:  SYNTHROID  Take 1 tablet (75 mcg total) by mouth daily before breakfast.     losartan 25 MG tablet  Commonly known as:  COZAAR  Take 25 mg by mouth daily.     multivitamin with minerals Tabs tablet  Take 1 tablet by mouth daily.     pantoprazole 40 MG tablet  Commonly known as:  PROTONIX  Take 40 mg by mouth daily.     traZODone 50 MG tablet  Commonly known as:  DESYREL  Take 50 mg by mouth at bedtime as needed for sleep.     warfarin 5 MG tablet  Commonly known as:  COUMADIN  Take 2.5-5 mg by mouth daily. Take 1 tablet daily except on Friday take 1/2 tablet           Follow-up Information   Follow up with [facility] On 07/11/2013. (At 2:15 PM with Riki Rusk for INR check; also to pick up 3-day event monitor)    Contact information:   [address] [place] [address]       Follow up with Lesleigh Noe, MD On 07/22/2013. (At 2:15 PM for hospital follow-up)    Specialty:  Cardiology   Contact information:   [address] [place] [address] [phone]       Signed: Lillia Mountain 07/07/2013, 11:09 AM

## 2013-07-08 ENCOUNTER — Other Ambulatory Visit: Payer: Self-pay

## 2013-07-08 DIAGNOSIS — R55 Syncope and collapse: Secondary | ICD-10-CM

## 2013-07-08 NOTE — Telephone Encounter (Signed)
INR in the ER was 2.3, and CT of head was negative.  Amlodipine was stopped due to possible orthostasis.  Patient sent home on previous warfarin dose of 5 mg daily except 2.5 mg Friday.  She is due to recheck INR with [place] on 07/11/13.  To Dr. Katrinka Blazing as Lorain Childes.

## 2013-07-10 ENCOUNTER — Encounter (INDEPENDENT_AMBULATORY_CARE_PROVIDER_SITE_OTHER): Payer: Medicare Other

## 2013-07-10 ENCOUNTER — Encounter: Payer: Self-pay | Admitting: *Deleted

## 2013-07-10 ENCOUNTER — Ambulatory Visit (INDEPENDENT_AMBULATORY_CARE_PROVIDER_SITE_OTHER): Payer: Medicare Other | Admitting: Pharmacist

## 2013-07-10 DIAGNOSIS — I749 Embolism and thrombosis of unspecified artery: Secondary | ICD-10-CM

## 2013-07-10 DIAGNOSIS — R55 Syncope and collapse: Secondary | ICD-10-CM

## 2013-07-10 LAB — POCT INR: INR: 2.5

## 2013-07-10 NOTE — Progress Notes (Signed)
Patient ID: Tanya Farmer, female   DOB: 16-Mar-1926, 79 y.o.   MRN: [id number] [facility] 30 day cardiac event monitor applied to patient.

## 2013-07-22 ENCOUNTER — Ambulatory Visit (INDEPENDENT_AMBULATORY_CARE_PROVIDER_SITE_OTHER): Payer: Medicare Other | Admitting: Interventional Cardiology

## 2013-07-22 ENCOUNTER — Encounter: Payer: Self-pay | Admitting: Interventional Cardiology

## 2013-07-22 VITALS — BP 120/70 | HR 72 | Ht 63.0 in | Wt 132.0 lb

## 2013-07-22 DIAGNOSIS — R55 Syncope and collapse: Secondary | ICD-10-CM

## 2013-07-22 DIAGNOSIS — R9431 Abnormal electrocardiogram [ECG] [EKG]: Secondary | ICD-10-CM

## 2013-07-22 DIAGNOSIS — I10 Essential (primary) hypertension: Secondary | ICD-10-CM

## 2013-07-22 DIAGNOSIS — I739 Peripheral vascular disease, unspecified: Secondary | ICD-10-CM

## 2013-07-22 DIAGNOSIS — F0390 Unspecified dementia without behavioral disturbance: Secondary | ICD-10-CM

## 2013-07-22 NOTE — Patient Instructions (Signed)
Your physician recommends that you continue on your current medications as directed. Please refer to the Current Medication list given to you today.  Your physician recommends that you schedule a follow-up appointment as needed. We will call you with your monitor results

## 2013-07-22 NOTE — Progress Notes (Signed)
Patient ID: Tanya Farmer, female   DOB: August 31, 1925, 79 y.o.   MRN: [id number]    [address] [place], [place]  [address] Phone: [phone] Fax:  [phone]  Date:  07/22/2013   ID:  Tanya Farmer, DOB August 22, 1926, MRN [id number]  PCP:  Lillia Mountain, MD   ASSESSMENT:  1. Syncope, uncertain etiology 2. Advanced dementia  3. Hypertension 4. Peripheral arterial disease    PLAN:  1. 30 day monitor   SUBJECTIVE: Tanya Farmer is a 79 y.o. female was recently hospitalized for an episode of syncope. One prior similar episode 2 years ago. No definitive findings on hospital evaluation including negative markers, normal echo for age, and no evidence of acute stroke or seizure. She is now wearing a monitor for 30 days. No recurrence of symptoms since discharge from the hospital. She is unable to give a history and is accompanied by her daughter.   Wt Readings from Last 3 Encounters:  07/22/13 132 lb (59.875 kg)  09/18/12 133 lb (60.328 kg)  05/08/12 135 lb 3.2 oz (61.326 kg)     Past Medical History  Diagnosis Date  . Hypertension   . Hyperlipidemia   . Thyroid disease     Hypothyroidism  . GERD (gastroesophageal reflux disease)   . Arthritis     Degenerative  . Peripheral vascular disease     Left   Fem-Pop and  tibial Thrombectomy  . High platelet count   . Dementia   . Pain in toe of right foot     Current Outpatient Prescriptions  Medication Sig Dispense Refill  . atorvastatin (LIPITOR) 10 MG tablet Take 10 mg by mouth daily.      . calcium-vitamin D (OSCAL WITH D) 500-200 MG-UNIT per tablet Take 1 tablet by mouth 2 (two) times daily.      Marland Kitchen donepezil (ARICEPT) 10 MG tablet Take 10 mg by mouth daily.       . fish oil-omega-3 fatty acids 1000 MG capsule Take 1 g by mouth daily.      . hydroxyurea (HYDREA) 500 MG capsule Take 1,000 mg by mouth daily. May take with food to minimize GI side effects.      Marland Kitchen levothyroxine (SYNTHROID) 75 MCG tablet Take 1  tablet (75 mcg total) by mouth daily before breakfast.  30 tablet  11  . losartan (COZAAR) 25 MG tablet Take 25 mg by mouth daily.      . Multiple Vitamin (MULTIVITAMIN WITH MINERALS) TABS tablet Take 1 tablet by mouth daily.      . pantoprazole (PROTONIX) 40 MG tablet Take 40 mg by mouth daily.      . traZODone (DESYREL) 50 MG tablet Take 50 mg by mouth at bedtime as needed for sleep.       Marland Kitchen warfarin (COUMADIN) 5 MG tablet Take 2.5-5 mg by mouth daily. Take 1 tablet daily except on Friday take 1/2 tablet       No current facility-administered medications for this visit.    Allergies:   No Known Allergies  Social History:  The patient  reports that she has quit smoking. She has never used smokeless tobacco. She reports that she does not drink alcohol or use illicit drugs.   ROS:  Please see the history of present illness.   NA to obtain any pertinent data   All other systems reviewed and negative.   OBJECTIVE: VS:  BP 120/70  Pulse 72  Ht 5\' 3"  (1.6 m)  Wt 132 lb (59.875 kg)  BMI 23.39 kg/m2 Well nourished, well developed, in no acute distress, pleasant  HEENT: normal Neck: JVD flat. Carotid bruit absent  Cardiac:  normal S1, S2; RRR; no murmur Lungs:  clear to auscultation bilaterally, no wheezing, rhonchi or rales Abd: soft, nontender, no hepatomegaly Ext: Edema absent. Pulses 2+  Skin: warm and dry Neuro:  CNs 2-12 intact, no focal abnormalities noted  EKG:  Normal sinus rhythm with nonspecific T-wave abnormality       Signed, Darci Needle III, MD 07/22/2013 2:43 PM

## 2013-08-07 ENCOUNTER — Ambulatory Visit (INDEPENDENT_AMBULATORY_CARE_PROVIDER_SITE_OTHER): Payer: Medicare Other | Admitting: Pharmacist

## 2013-08-07 DIAGNOSIS — I749 Embolism and thrombosis of unspecified artery: Secondary | ICD-10-CM

## 2013-08-07 LAB — POCT INR: INR: 2.9

## 2013-08-20 ENCOUNTER — Telehealth: Payer: Self-pay

## 2013-08-20 ENCOUNTER — Other Ambulatory Visit: Payer: Self-pay | Admitting: Internal Medicine

## 2013-08-20 ENCOUNTER — Ambulatory Visit
Admission: RE | Admit: 2013-08-20 | Discharge: 2013-08-20 | Disposition: A | Discharge status: Home / Self Care | Payer: Medicare Other | Source: Ambulatory Visit | Attending: Internal Medicine | Admitting: Internal Medicine

## 2013-08-20 DIAGNOSIS — R270 Ataxia, unspecified: Secondary | ICD-10-CM

## 2013-08-20 DIAGNOSIS — R269 Unspecified abnormalities of gait and mobility: Secondary | ICD-10-CM

## 2013-08-20 NOTE — Telephone Encounter (Signed)
results of pt monitor given to pt daughter.no significant arrythmia.pt daughter verbalized understanding. pt daughter rqst copy of monitor.copy left at front desk for pick up.pt daughter sts that pt has seen Dr.Griffin, and Hussain this wk., and they are trying to determine if something is going on with the pt neurologically and wanted Dr.Bunda to know.meade daughter aware Dr.Ducre will be on vacation, but to call the office if anything cardiac related. Pt daughter agreed and verbalized understanding

## 2013-08-21 ENCOUNTER — Other Ambulatory Visit: Payer: Medicare Other

## 2013-09-03 ENCOUNTER — Ambulatory Visit (INDEPENDENT_AMBULATORY_CARE_PROVIDER_SITE_OTHER): Payer: Medicare Other | Admitting: Pharmacist

## 2013-09-03 DIAGNOSIS — I749 Embolism and thrombosis of unspecified artery: Secondary | ICD-10-CM

## 2013-09-03 LAB — POCT INR: INR: 2.5

## 2013-09-16 ENCOUNTER — Emergency Department (HOSPITAL_COMMUNITY): Payer: Medicare Other

## 2013-09-16 ENCOUNTER — Inpatient Hospital Stay (HOSPITAL_COMMUNITY)
Admission: EM | Admit: 2013-09-16 | Discharge: 2013-09-20 | DRG: 690 | Disposition: A | Discharge status: Skilled Nursing Facility | Payer: Medicare Other | Attending: Internal Medicine | Admitting: Internal Medicine

## 2013-09-16 ENCOUNTER — Encounter (HOSPITAL_COMMUNITY): Payer: Self-pay | Admitting: Emergency Medicine

## 2013-09-16 DIAGNOSIS — Z79899 Other long term (current) drug therapy: Secondary | ICD-10-CM

## 2013-09-16 DIAGNOSIS — N390 Urinary tract infection, site not specified: Principal | ICD-10-CM | POA: Diagnosis present

## 2013-09-16 DIAGNOSIS — E860 Dehydration: Secondary | ICD-10-CM

## 2013-09-16 DIAGNOSIS — K219 Gastro-esophageal reflux disease without esophagitis: Secondary | ICD-10-CM | POA: Diagnosis present

## 2013-09-16 DIAGNOSIS — I739 Peripheral vascular disease, unspecified: Secondary | ICD-10-CM | POA: Diagnosis present

## 2013-09-16 DIAGNOSIS — R531 Weakness: Secondary | ICD-10-CM

## 2013-09-16 DIAGNOSIS — D473 Essential (hemorrhagic) thrombocythemia: Secondary | ICD-10-CM | POA: Diagnosis present

## 2013-09-16 DIAGNOSIS — Z87891 Personal history of nicotine dependence: Secondary | ICD-10-CM

## 2013-09-16 DIAGNOSIS — E039 Hypothyroidism, unspecified: Secondary | ICD-10-CM | POA: Diagnosis present

## 2013-09-16 DIAGNOSIS — I10 Essential (primary) hypertension: Secondary | ICD-10-CM | POA: Diagnosis present

## 2013-09-16 DIAGNOSIS — E785 Hyperlipidemia, unspecified: Secondary | ICD-10-CM | POA: Diagnosis present

## 2013-09-16 DIAGNOSIS — M1990 Unspecified osteoarthritis, unspecified site: Secondary | ICD-10-CM | POA: Diagnosis present

## 2013-09-16 DIAGNOSIS — F0390 Unspecified dementia without behavioral disturbance: Secondary | ICD-10-CM | POA: Diagnosis present

## 2013-09-16 DIAGNOSIS — L539 Erythematous condition, unspecified: Secondary | ICD-10-CM | POA: Diagnosis present

## 2013-09-16 DIAGNOSIS — Z7901 Long term (current) use of anticoagulants: Secondary | ICD-10-CM

## 2013-09-16 DIAGNOSIS — T2010XA Burn of first degree of head, face, and neck, unspecified site, initial encounter: Secondary | ICD-10-CM

## 2013-09-16 DIAGNOSIS — D6859 Other primary thrombophilia: Secondary | ICD-10-CM | POA: Diagnosis present

## 2013-09-16 DIAGNOSIS — R627 Adult failure to thrive: Secondary | ICD-10-CM | POA: Insufficient documentation

## 2013-09-16 DIAGNOSIS — L0201 Cutaneous abscess of face: Secondary | ICD-10-CM | POA: Diagnosis present

## 2013-09-16 DIAGNOSIS — L03211 Cellulitis of face: Secondary | ICD-10-CM | POA: Diagnosis present

## 2013-09-16 HISTORY — DX: Hypothyroidism, unspecified: E03.9

## 2013-09-16 LAB — CBC WITH DIFFERENTIAL/PLATELET
Basophils Absolute: 0 10*3/uL (ref 0.0–0.1)
Basophils Relative: 0 % (ref 0–1)
EOS ABS: 0 10*3/uL (ref 0.0–0.7)
Eosinophils Relative: 0 % (ref 0–5)
HEMATOCRIT: 41.6 % (ref 36.0–46.0)
Hemoglobin: 14.1 g/dL (ref 12.0–15.0)
LYMPHS ABS: 0.4 10*3/uL — AB (ref 0.7–4.0)
Lymphocytes Relative: 7 % — ABNORMAL LOW (ref 12–46)
MCH: 38 pg — AB (ref 26.0–34.0)
MCHC: 33.9 g/dL (ref 30.0–36.0)
MCV: 112.1 fL — AB (ref 78.0–100.0)
Monocytes Absolute: 0.2 10*3/uL (ref 0.1–1.0)
Monocytes Relative: 3 % (ref 3–12)
NEUTROS ABS: 5.5 10*3/uL (ref 1.7–7.7)
Neutrophils Relative %: 90 % — ABNORMAL HIGH (ref 43–77)
Platelets: 731 10*3/uL — ABNORMAL HIGH (ref 150–400)
RBC: 3.71 MIL/uL — ABNORMAL LOW (ref 3.87–5.11)
RDW: 15.3 % (ref 11.5–15.5)
WBC: 6.1 10*3/uL (ref 4.0–10.5)

## 2013-09-16 LAB — COMPREHENSIVE METABOLIC PANEL
ALT: 19 U/L (ref 0–35)
AST: 32 U/L (ref 0–37)
Albumin: 3.7 g/dL (ref 3.5–5.2)
Alkaline Phosphatase: 63 U/L (ref 39–117)
BILIRUBIN TOTAL: 0.4 mg/dL (ref 0.3–1.2)
BUN: 20 mg/dL (ref 6–23)
CO2: 26 mEq/L (ref 19–32)
CREATININE: 0.76 mg/dL (ref 0.50–1.10)
Calcium: 9.8 mg/dL (ref 8.4–10.5)
Chloride: 103 mEq/L (ref 96–112)
GFR calc non Af Amer: 74 mL/min — ABNORMAL LOW (ref >90)
GFR, EST AFRICAN AMERICAN: 86 mL/min — AB (ref >90)
Glucose, Bld: 168 mg/dL — ABNORMAL HIGH (ref 70–99)
Potassium: 4 mEq/L (ref 3.7–5.3)
Sodium: 143 mEq/L (ref 137–147)
TOTAL PROTEIN: 8.6 g/dL — AB (ref 6.0–8.3)

## 2013-09-16 LAB — URINALYSIS, ROUTINE W REFLEX MICROSCOPIC
Bilirubin Urine: NEGATIVE
GLUCOSE, UA: NEGATIVE mg/dL
Hgb urine dipstick: NEGATIVE
Ketones, ur: NEGATIVE mg/dL
NITRITE: POSITIVE — AB
PH: 8 (ref 5.0–8.0)
Specific Gravity, Urine: 1.029 (ref 1.005–1.030)
Urobilinogen, UA: 0.2 mg/dL (ref 0.0–1.0)

## 2013-09-16 LAB — URINE MICROSCOPIC-ADD ON

## 2013-09-16 LAB — PROTIME-INR
INR: 1.89 — ABNORMAL HIGH (ref 0.00–1.49)
PROTHROMBIN TIME: 21.1 s — AB (ref 11.6–15.2)

## 2013-09-16 LAB — PRO B NATRIURETIC PEPTIDE: Pro B Natriuretic peptide (BNP): 619.3 pg/mL — ABNORMAL HIGH (ref 0–450)

## 2013-09-16 LAB — POCT I-STAT TROPONIN I: Troponin i, poc: 0.01 ng/mL (ref 0.00–0.08)

## 2013-09-16 MED ORDER — ONDANSETRON HCL 4 MG PO TABS: 4.0000 mg | ORAL_TABLET | Freq: Four times a day (QID) | ORAL | Status: DC | PRN

## 2013-09-16 MED ORDER — TRAZODONE 25 MG HALF TABLET
25.0000 mg | ORAL_TABLET | Freq: Every evening | ORAL | Status: DC | PRN
  Filled 2013-09-16: qty 1

## 2013-09-16 MED ORDER — HYDRALAZINE HCL 20 MG/ML IJ SOLN
10.0000 mg | Freq: Four times a day (QID) | INTRAMUSCULAR | Status: DC | PRN
  Administered 2013-09-16 – 2013-09-20 (×3): 10 mg via INTRAVENOUS
  Filled 2013-09-16 (×4): qty 1

## 2013-09-16 MED ORDER — LOSARTAN POTASSIUM 25 MG PO TABS
25.0000 mg | ORAL_TABLET | Freq: Every day | ORAL | Status: DC
  Administered 2013-09-17 – 2013-09-18 (×2): 25 mg via ORAL
  Filled 2013-09-16 (×3): qty 1

## 2013-09-16 MED ORDER — HYDROXYUREA 500 MG PO CAPS
1000.0000 mg | ORAL_CAPSULE | Freq: Every day | ORAL | Status: DC
  Administered 2013-09-17 – 2013-09-20 (×4): 1000 mg via ORAL
  Filled 2013-09-16 (×5): qty 2

## 2013-09-16 MED ORDER — ONDANSETRON HCL 4 MG/2ML IJ SOLN: 4.0000 mg | Freq: Four times a day (QID) | INTRAMUSCULAR | Status: DC | PRN

## 2013-09-16 MED ORDER — ATORVASTATIN CALCIUM 10 MG PO TABS
10.0000 mg | ORAL_TABLET | Freq: Every day | ORAL | Status: DC
  Administered 2013-09-16 – 2013-09-20 (×5): 10 mg via ORAL
  Filled 2013-09-16 (×5): qty 1

## 2013-09-16 MED ORDER — LEVOTHYROXINE SODIUM 75 MCG PO TABS
75.0000 ug | ORAL_TABLET | Freq: Every day | ORAL | Status: DC
  Administered 2013-09-17 – 2013-09-20 (×4): 75 ug via ORAL
  Filled 2013-09-16 (×5): qty 1

## 2013-09-16 MED ORDER — DONEPEZIL HCL 10 MG PO TABS
10.0000 mg | ORAL_TABLET | Freq: Every day | ORAL | Status: DC
  Administered 2013-09-16 – 2013-09-20 (×5): 10 mg via ORAL
  Filled 2013-09-16 (×5): qty 1

## 2013-09-16 MED ORDER — SODIUM CHLORIDE 0.9 % IV BOLUS (SEPSIS)
500.0000 mL | Freq: Once | INTRAVENOUS | Status: AC
  Administered 2013-09-16: 500 mL via INTRAVENOUS

## 2013-09-16 MED ORDER — ALBUTEROL SULFATE (2.5 MG/3ML) 0.083% IN NEBU: 2.5000 mg | INHALATION_SOLUTION | RESPIRATORY_TRACT | Status: DC | PRN

## 2013-09-16 MED ORDER — ACETAMINOPHEN 650 MG RE SUPP: 650.0000 mg | Freq: Four times a day (QID) | RECTAL | Status: DC | PRN

## 2013-09-16 MED ORDER — DEXTROSE 5 % IV SOLN
1.0000 g | Freq: Once | INTRAVENOUS | Status: AC
  Administered 2013-09-16: 1 g via INTRAVENOUS
  Filled 2013-09-16: qty 10

## 2013-09-16 MED ORDER — CEFTRIAXONE SODIUM 1 G IJ SOLR
1.0000 g | INTRAMUSCULAR | Status: DC
Start: 2013-09-17 — End: 2013-09-19
  Administered 2013-09-17 – 2013-09-18 (×2): 1 g via INTRAVENOUS
  Filled 2013-09-16 (×3): qty 10

## 2013-09-16 MED ORDER — CALCIUM CARBONATE-VITAMIN D 500-200 MG-UNIT PO TABS
1.0000 | ORAL_TABLET | Freq: Two times a day (BID) | ORAL | Status: DC
  Administered 2013-09-16 – 2013-09-20 (×8): 1 via ORAL
  Filled 2013-09-16 (×9): qty 1

## 2013-09-16 MED ORDER — ACETAMINOPHEN 325 MG PO TABS: 650.0000 mg | ORAL_TABLET | Freq: Four times a day (QID) | ORAL | Status: DC | PRN

## 2013-09-16 MED ORDER — SODIUM CHLORIDE 0.9 % IV SOLN
INTRAVENOUS | Status: AC
  Administered 2013-09-16: 20:00:00 via INTRAVENOUS

## 2013-09-16 MED ORDER — VANCOMYCIN HCL IN DEXTROSE 1-5 GM/200ML-% IV SOLN
1000.0000 mg | Freq: Once | INTRAVENOUS | Status: AC
  Administered 2013-09-16: 1000 mg via INTRAVENOUS
  Filled 2013-09-16: qty 200

## 2013-09-16 MED ORDER — PANTOPRAZOLE SODIUM 40 MG PO TBEC
40.0000 mg | DELAYED_RELEASE_TABLET | Freq: Every day | ORAL | Status: DC
  Administered 2013-09-16 – 2013-09-20 (×5): 40 mg via ORAL
  Filled 2013-09-16 (×5): qty 1

## 2013-09-16 MED ORDER — WARFARIN - PHARMACIST DOSING INPATIENT: Freq: Every day | Status: DC

## 2013-09-16 NOTE — H&P (Addendum)
[facility] History and Physical  Tanya Farmer XJO:[id number] DOB: 12-26-1925 DOA: 09/16/2013   PCP: Tanya Shelling, MD  Specialists: Dr. Daneen Schick is her cardiologist  Chief Complaint: Weakness, nausea, vomiting since yesterday  HPI: Tanya Farmer is a 80 y.o. female with a past medical history of dementia, hypertension, essential tremor, psychosis, history of hypercoagulable state on warfarin, who was brought in by her daughter, because patient has been experiencing generalized weakness since yesterday. According to the daughter she took her to church yesterday, which was uneventful. After she returned from church patient was noted to have sweating episodes. She had been having episodes of incontinence on and off for the last few days. And, then she had an episode of nausea and vomiting last night. Her original plan was for the patient to go to to her primary care physician's office. However, since she was complaining of weakness this morning the daughter decided to bring her into the hospital. Patient appears to have moderate to advanced dementia and, hence, is a very poor historian. Patient denies any pain at this time. On examination patient was noted to have erythema over her right face and neck and upper part of the chest. The daughter mentioned that this might have been going on for at least a few months. But she doesn't know the reason for this. She remained noncommittal for the most part. Also tells me that for the past few weeks patient has been leaning towards one side or the other. She has been slumping her shoulder. She took her to the primary care physician's office and underwent MRI Brain, which did not show any stroke.  Home Medications: Prior to Admission medications   Medication Sig Start Date End Date Taking? Authorizing Provider  atorvastatin (LIPITOR) 10 MG tablet Take 10 mg by mouth daily.   Yes Historical Provider, MD  calcium-vitamin D (OSCAL WITH D) 500-200 MG-UNIT  per tablet Take 1 tablet by mouth 2 (two) times daily.   Yes Historical Provider, MD  fish oil-omega-3 fatty acids 1000 MG capsule Take 1 g by mouth daily.   Yes Historical Provider, MD  hydroxyurea (HYDREA) 500 MG capsule Take 1,000 mg by mouth daily. May take with food to minimize GI side effects.   Yes Historical Provider, MD  levothyroxine (SYNTHROID) 75 MCG tablet Take 1 tablet (75 mcg total) by mouth daily before breakfast. 07/07/13  Yes Tanya Shelling, MD  losartan (COZAAR) 25 MG tablet Take 25 mg by mouth daily.   Yes Historical Provider, MD  Multiple Vitamin (MULTIVITAMIN WITH MINERALS) TABS tablet Take 1 tablet by mouth daily.   Yes Historical Provider, MD  pantoprazole (PROTONIX) 40 MG tablet Take 40 mg by mouth daily.   Yes Historical Provider, MD  warfarin (COUMADIN) 5 MG tablet Take 2.5-5 mg by mouth daily. Take 1 tablet daily except on Friday take 1/2 tablet   Yes Historical Provider, MD  donepezil (ARICEPT) 10 MG tablet Take 10 mg by mouth daily.     Historical Provider, MD  traZODone (DESYREL) 50 MG tablet Take 25 mg by mouth at bedtime as needed for sleep.     Historical Provider, MD    Allergies: No Known Allergies  Past Medical History: Past Medical History  Diagnosis Date  . Hypertension   . Hyperlipidemia   . Thyroid disease     Hypothyroidism  . GERD (gastroesophageal reflux disease)   . Arthritis     Degenerative  . Peripheral vascular disease     Left  Fem-Pop and  tibial Thrombectomy  . High platelet count   . Dementia   . Pain in toe of right foot     Past Surgical History  Procedure Laterality Date  . Femoral-popliteal bypass graft  2002    Left  Dr. Sherren Mocha Early  . Abdominal hysterectomy    . Cataract surgery      left eye    Social History: Patient lives with the daughter. She is able to ambulate with a walker. No smoking, alcohol or illicit drug use.  Family History: Unable to obtain due to dementia  Review of Systems - unable to obtain  due to dementia  Physical Examination  Filed Vitals:   09/16/13 1600 09/16/13 1615 09/16/13 1630 09/16/13 1645  BP: 168/66 179/79 177/96 174/96  Pulse: 74 83 83 90  Temp:      TempSrc:      Resp:      Height:      Weight:      SpO2: 99% 99% 99% 100%    General appearance: alert, distracted, no distress and slowed mentation Head: Normocephalic, without obvious abnormality, erythema noted over right face and extending to neck and upper chest. warm to touch. Eyes: conjunctivae/corneas clear. PERRL, EOM's intact. Throat: lips, mucosa, and tongue normal; teeth and gums normal Neck: no adenopathy, no carotid bruit, no JVD, supple, symmetrical, trachea midline and thyroid not enlarged, symmetric, no tenderness/mass/nodules Resp: clear to auscultation bilaterally Cardio: regular rate and rhythm, S1, S2 normal, no murmur, click, rub or gallop GI: soft, non-tender; bowel sounds normal; no masses,  no organomegaly Extremities: extremities normal, atraumatic, no cyanosis or edema Pulses: 2+ and symmetric Skin: erythema and warmth over right face and neck as stated above. Lymph nodes: Cervical, supraclavicular, and axillary nodes normal. Neurologic: She is alert, confused and disoriented. Cranial nerves are intact II-XII. Motor strength is equal, bilateral upper and lower extremity.  Laboratory Data: Results for orders placed during the hospital encounter of 09/16/13 (from the past 48 hour(s))  URINALYSIS, ROUTINE W REFLEX MICROSCOPIC     Status: Abnormal   Collection Time    09/16/13  2:15 PM      Result Value Range   Color, Urine YELLOW  YELLOW   APPearance TURBID (*) CLEAR   Specific Gravity, Urine 1.029  1.005 - 1.030   pH 8.0  5.0 - 8.0   Glucose, UA NEGATIVE  NEGATIVE mg/dL   Hgb urine dipstick NEGATIVE  NEGATIVE   Bilirubin Urine NEGATIVE  NEGATIVE   Ketones, ur NEGATIVE  NEGATIVE mg/dL   Protein, ur >300 (*) NEGATIVE mg/dL   Urobilinogen, UA 0.2  0.0 - 1.0 mg/dL   Nitrite  POSITIVE (*) NEGATIVE   Leukocytes, UA LARGE (*) NEGATIVE  URINE MICROSCOPIC-ADD ON     Status: Abnormal   Collection Time    09/16/13  2:15 PM      Result Value Range   Squamous Epithelial / LPF RARE  RARE   WBC, UA TOO NUMEROUS TO COUNT  <3 WBC/hpf   Bacteria, UA MANY (*) RARE  CBC WITH DIFFERENTIAL     Status: Abnormal   Collection Time    09/16/13  2:40 PM      Result Value Range   WBC 6.1  4.0 - 10.5 K/uL   RBC 3.71 (*) 3.87 - 5.11 MIL/uL   Hemoglobin 14.1  12.0 - 15.0 g/dL   HCT 41.6  36.0 - 46.0 %   MCV 112.1 (*) 78.0 - 100.0 fL  MCH 38.0 (*) 26.0 - 34.0 pg   MCHC 33.9  30.0 - 36.0 g/dL   RDW 15.3  11.5 - 15.5 %   Platelets 731 (*) 150 - 400 K/uL   Neutrophils Relative % 90 (*) 43 - 77 %   Lymphocytes Relative 7 (*) 12 - 46 %   Monocytes Relative 3  3 - 12 %   Eosinophils Relative 0  0 - 5 %   Basophils Relative 0  0 - 1 %   Neutro Abs 5.5  1.7 - 7.7 K/uL   Lymphs Abs 0.4 (*) 0.7 - 4.0 K/uL   Monocytes Absolute 0.2  0.1 - 1.0 K/uL   Eosinophils Absolute 0.0  0.0 - 0.7 K/uL   Basophils Absolute 0.0  0.0 - 0.1 K/uL   Smear Review MORPHOLOGY UNREMARKABLE    COMPREHENSIVE METABOLIC PANEL     Status: Abnormal   Collection Time    09/16/13  2:40 PM      Result Value Range   Sodium 143  137 - 147 mEq/L   Potassium 4.0  3.7 - 5.3 mEq/L   Chloride 103  96 - 112 mEq/L   CO2 26  19 - 32 mEq/L   Glucose, Bld 168 (*) 70 - 99 mg/dL   BUN 20  6 - 23 mg/dL   Creatinine, Ser 0.76  0.50 - 1.10 mg/dL   Calcium 9.8  8.4 - 10.5 mg/dL   Total Protein 8.6 (*) 6.0 - 8.3 g/dL   Albumin 3.7  3.5 - 5.2 g/dL   AST 32  0 - 37 U/L   ALT 19  0 - 35 U/L   Alkaline Phosphatase 63  39 - 117 U/L   Total Bilirubin 0.4  0.3 - 1.2 mg/dL   GFR calc non Af Amer 74 (*) >90 mL/min   GFR calc Af Amer 86 (*) >90 mL/min   Comment: (NOTE)     The eGFR has been calculated using the CKD EPI equation.     This calculation has not been validated in all clinical situations.     eGFR's persistently <90  mL/min signify possible Chronic Kidney     Disease.  POCT I-STAT TROPONIN I     Status: None   Collection Time    09/16/13  3:10 PM      Result Value Range   Troponin i, poc 0.01  0.00 - 0.08 ng/mL   Comment 3            Comment: Due to the release kinetics of cTnI,     a negative result within the first hours     of the onset of symptoms does not rule out     myocardial infarction with certainty.     If myocardial infarction is still suspected,     repeat the test at appropriate intervals.    Radiology Reports: Dg Chest 2 View  09/16/2013   CLINICAL DATA:  Weakness.  EXAM: CHEST  2 VIEW  COMPARISON:  07/05/2013.  FINDINGS: Mediastinum and hilar structures are normal. Cardiomegaly pulmonary vascular congestion. Mild infiltrates developing in the lung bases cannot be excluded. This could be secondary to pulmonary edema or pneumonia. No pleural effusion or pneumothorax. No acute osseous abnormality .  IMPRESSION: Stable cardiomegaly and pulmonary venous congestion. Mild infiltrates developing in the lung bases cannot be excluded. This could be secondary to mild pulmonary edema versus pneumonia.   Electronically Signed   By: Marcello Moores  Register   On: 09/16/2013  16:15    Electrocardiogram: Sinus rhythm at 75 beats per minute. Left axis deviation. Intervals are normal. No Q waves. No concerning ST or T-wave changes.  Problem List  Principal Problem:   UTI (lower urinary tract infection) Active Problems:   Peripheral vascular disease, unspecified   Essential thrombocytosis   Dementia   Hypothyroidism   FTT (failure to thrive) in adult   Facial erythema   Chronic anticoagulation   Assessment: This is an 80 year old, African American female, with past medical history as stated earlier, presents with weakness, nausea and vomiting. Evaluation in the emergency department is positive for a urinary tract infection. Also, appears to be dehydrated. She is noted to have erythema in the right side  of her face extending down neck and upper chest. It is warm to touch. Unclear how long this might have been present. Etiology remains uncertain.  Plan: #1 urinary tract infection: Urine will be sent for culture. Treat with ceftriaxone.  #2 right-sided facial erythema extending down to the neck and upper chest: Etiology remains unclear. Since her PCP will be in to check her tomorrow his input will be important as apparently, he has seen this before. It remains unclear if this is an acute issue or a chronic issue. However, we will give her a dose of vancomycin to treat for potential cellulitis. Although the WBC is normal. Will defer further workup to her PCP.  #3 history of hypercoagulable state on chronic anticoagulation with warfarin: Check PT/INR. Pharmacy to dose warfarin.  #4 history of essential thrombocytosis: Continue with hydroxyurea.  #5 history of dementia: Continue with Aricept.  #6 history of hypothyroidism: Continue with levothyroxine.  #7 failure to thrive and dehydration: Give her gentle IV hydration. PT and OT evaluation. She does not have any focal neurological deficits. I think her generalized weakness is due to dehydration and the UTI.  #8 history of hypertension: Blood pressure is noted to be elevated. Utilize hydralazine as needed. Resume her antihypertensive regimen.  #9 hyperglycemia: No known history of diabetes. Check a fasting level in the morning.  Chest X-ray report was reviewed. Clinically, she does not have any crackles. She's not hypoxic. She's not tachypneic. Continue to monitor clinically.  DVT Prophylaxis: She is on warfarin Code Status: Full code Family Communication: Discussed with the daughter  Disposition Plan: Admit to [place]. Dr. Laurann Montana to assume care in the morning.  Further management decisions will depend on results of further testing and patient's response to treatment.  [facility]  [facility] Pager [phone]  If 7PM-7AM,  please contact night-coverage [email or web address] Password [email or web address]  09/16/2013, 5:13 PM

## 2013-09-16 NOTE — ED Notes (Signed)
Yesterday patients family noted that patient was weak at church and not walking as fast as usual, increasing weakness throughout the day, nausea and vomiting at 2000 last pm, patient is afebrile at triage.  EMS VS 140/98, HR: 80, RR:18, 98% RA, CBG 174.

## 2013-09-16 NOTE — ED Provider Notes (Addendum)
CSN: [id number]     Arrival date & time 09/16/13  1349 History   First MD Initiated Contact with Patient 09/16/13 1350     Chief Complaint  Patient presents with  . Weakness   (Consider location/radiation/quality/duration/timing/severity/associated sxs/prior Treatment) Patient is a 80 y.o. female presenting with weakness. The history is provided by the EMS personnel. The history is limited by the absence of a caregiver.  Weakness This is a new problem. The current episode started 12 to 24 hours ago. The problem occurs constantly. The problem has been gradually worsening. Pertinent negatives include no shortness of breath. Associated symptoms comments: Generalized weakness since yesterday per EMS.  One episode of nausea and vomiting last night.. The treatment provided moderate relief.    Past Medical History  Diagnosis Date  . Hypertension   . Hyperlipidemia   . Thyroid disease     Hypothyroidism  . GERD (gastroesophageal reflux disease)   . Arthritis     Degenerative  . Peripheral vascular disease     Left   Fem-Pop and  tibial Thrombectomy  . High platelet count   . Dementia   . Pain in toe of right foot    Past Surgical History  Procedure Laterality Date  . Femoral-popliteal bypass graft  2002    Left  Dr. Sherren Mocha Early  . Abdominal hysterectomy    . Cataract surgery      left eye   No family history on file. History  Substance Use Topics  . Smoking status: Former Research scientist (life sciences)  . Smokeless tobacco: Never Used  . Alcohol Use: No   OB History   Grav Para Term Preterm Abortions TAB SAB Ect Mult Living                 Review of Systems  Unable to perform ROS Respiratory: Negative for cough and shortness of breath.   Neurological: Positive for weakness.    Allergies  Review of patient's allergies indicates no known allergies.  Home Medications   Current Outpatient Rx  Name  Route  Sig  Dispense  Refill  . atorvastatin (LIPITOR) 10 MG tablet   Oral   Take 10 mg by  mouth daily.         . calcium-vitamin D (OSCAL WITH D) 500-200 MG-UNIT per tablet   Oral   Take 1 tablet by mouth 2 (two) times daily.         Marland Kitchen donepezil (ARICEPT) 10 MG tablet   Oral   Take 10 mg by mouth daily.          . fish oil-omega-3 fatty acids 1000 MG capsule   Oral   Take 1 g by mouth daily.         . hydroxyurea (HYDREA) 500 MG capsule   Oral   Take 1,000 mg by mouth daily. May take with food to minimize GI side effects.         Marland Kitchen levothyroxine (SYNTHROID) 75 MCG tablet   Oral   Take 1 tablet (75 mcg total) by mouth daily before breakfast.   30 tablet   11   . losartan (COZAAR) 25 MG tablet   Oral   Take 25 mg by mouth daily.         . Multiple Vitamin (MULTIVITAMIN WITH MINERALS) TABS tablet   Oral   Take 1 tablet by mouth daily.         . pantoprazole (PROTONIX) 40 MG tablet   Oral   Take 40  mg by mouth daily.         . traZODone (DESYREL) 50 MG tablet   Oral   Take 50 mg by mouth at bedtime as needed for sleep.          Marland Kitchen warfarin (COUMADIN) 5 MG tablet   Oral   Take 2.5-5 mg by mouth daily. Take 1 tablet daily except on Friday take 1/2 tablet          There were no vitals taken for this visit. Physical Exam  Nursing note and vitals reviewed. Constitutional: She appears well-developed and well-nourished. No distress.  HENT:  Head: Normocephalic and atraumatic.  Mouth/Throat: Oropharynx is clear and moist.  Eyes: Conjunctivae and EOM are normal. Pupils are equal, round, and reactive to light.  Neck: Normal range of motion. Neck supple.  Cardiovascular: Normal rate, regular rhythm and intact distal pulses.   No murmur heard. Pulmonary/Chest: Effort normal and breath sounds normal. No respiratory distress. She has no wheezes. She has no rales.  Abdominal: Soft. She exhibits no distension. There is no tenderness. There is no rebound and no guarding.  Musculoskeletal: Normal range of motion. She exhibits no edema and no  tenderness.  Neurological: She is alert.  Alert but oriented to self  Skin: Skin is warm and dry. No rash noted. No erythema.     Psychiatric: She has a normal mood and affect. Her behavior is normal.    ED Course  Procedures (including critical care time) Labs Review Labs Reviewed  CBC WITH DIFFERENTIAL - Abnormal; Notable for the following:    RBC 3.71 (*)    MCV 112.1 (*)    MCH 38.0 (*)    Platelets 731 (*)    All other components within normal limits  COMPREHENSIVE METABOLIC PANEL - Abnormal; Notable for the following:    Glucose, Bld 168 (*)    Total Protein 8.6 (*)    GFR calc non Af Amer 74 (*)    GFR calc Af Amer 86 (*)    All other components within normal limits  URINALYSIS, ROUTINE W REFLEX MICROSCOPIC - Abnormal; Notable for the following:    APPearance TURBID (*)    Protein, ur >300 (*)    Nitrite POSITIVE (*)    Leukocytes, UA LARGE (*)    All other components within normal limits  URINE MICROSCOPIC-ADD ON - Abnormal; Notable for the following:    Bacteria, UA MANY (*)    All other components within normal limits  URINE CULTURE  POCT I-STAT TROPONIN I   Imaging Review No results found.  EKG Interpretation    Date/Time:  Monday September 16 2013 14:06:23 EST Ventricular Rate:  75 PR Interval:  152 QRS Duration: 81 QT Interval:  382 QTC Calculation: 427 R Axis:   -20 Text Interpretation:  Sinus rhythm Multiple premature complexes, vent  Borderline left axis deviation Repol abnrm suggests ischemia, lateral leads Minimal ST elevation, inferior leads No significant change since last tracing Confirmed by Maryan Rued  MD, Savhanna Sliva ([id number]) on 09/16/2013 3:46:37 PM            MDM   1. UTI (lower urinary tract infection)   2. Generalized weakness   3. Dehydration     Patient with a history of dementia who lives at home with family was sent in by EMS today due to the generalized progressive weakness over the last 24 hours and one episode of nausea and  vomiting yesterday. Currently there are no family members available the  patient has no complaints at this time is awake and alert and very pleasant. No evidence of fluid overload on exam, no abdominal pain or chest complaints or abnormalities on exam at this time.  Check for infectious etiology as the cause for her weakness versus cardiac etiology versus electrolyte etiology.  CBC, CMP, UA, troponin, chest x-ray, EKG pending  3:18 PM Family states pt not eating and having difficulty getting her to take meds.  Also having trouble getting around the house.  Evidence of UTI with TNTC WBC and positive nit/leuk on exam.  Pt given rocephin and IV hydration.  Will admit for further care.  Blanchie Dessert, MD 09/16/13 [place], MD 09/16/13 [phone]

## 2013-09-16 NOTE — Progress Notes (Signed)
ANTICOAGULATION CONSULT NOTE - Initial Consult  Pharmacy Consult for Coumadin Indication: hypercoagulable state  No Known Allergies  Patient Measurements: Height: 5\' 4"  (162.6 cm) Weight: 130 lb (58.968 kg) IBW/kg (Calculated) : 54.7  Vital Signs: Temp: 98.7 F (37.1 C) (01/19 1809) Temp src: Oral (01/19 1809) BP: 187/94 mmHg (01/19 1809) Pulse Rate: 85 (01/19 1809)  Labs:  Recent Labs  09/16/13 1440 09/16/13 1658  HGB 14.1  --   HCT 41.6  --   PLT 731*  --   LABPROT  --  21.1*  INR  --  1.89*  CREATININE 0.76  --     Estimated Creatinine Clearance: 43.6 ml/min (by C-G formula based on Cr of 0.76).   Medical History: Past Medical History  Diagnosis Date  . Hypertension   . Hyperlipidemia   . Thyroid disease     Hypothyroidism  . GERD (gastroesophageal reflux disease)   . Arthritis     Degenerative  . Peripheral vascular disease     Left   Fem-Pop and  tibial Thrombectomy  . High platelet count   . Dementia   . Pain in toe of right foot     Medications:  Coumadin 5mg  daily except 2.5mg  on Fridays, last dose taken 1/19  Assessment: 80 year old female admitted with nausea and vomiting and found to have UTI.  She is on chronic anticoagulation with Coumadin and her INR is slightly below goal on admission.  However acute illness along with decreased PO intake is likely to increase her sensitivity to Coumadin.  I am hesitant to increase her dosage tonight due to these factors, coupled with a review of her outpatient anticoagulation visits which show she has been therapeutic on this regimen since October 2014.  Goal of Therapy:  INR 2-3   Plan:  No Coumadin tonight as she has already had today's dose Daily PT/INR monitoring  Legrand Como, Pharm.D., BCPS, AAHIVP Clinical Pharmacist Phone: [phone] or [phone] 09/16/2013, 6:17 PM

## 2013-09-17 ENCOUNTER — Encounter (HOSPITAL_COMMUNITY): Payer: Self-pay | Admitting: General Practice

## 2013-09-17 LAB — CBC
HEMATOCRIT: 38.6 % (ref 36.0–46.0)
HEMOGLOBIN: 13.3 g/dL (ref 12.0–15.0)
MCH: 37.8 pg — ABNORMAL HIGH (ref 26.0–34.0)
MCHC: 34.5 g/dL (ref 30.0–36.0)
MCV: 109.7 fL — ABNORMAL HIGH (ref 78.0–100.0)
Platelets: 706 10*3/uL — ABNORMAL HIGH (ref 150–400)
RBC: 3.52 MIL/uL — ABNORMAL LOW (ref 3.87–5.11)
RDW: 15.3 % (ref 11.5–15.5)
WBC: 5.9 10*3/uL (ref 4.0–10.5)

## 2013-09-17 LAB — COMPREHENSIVE METABOLIC PANEL
ALT: 15 U/L (ref 0–35)
AST: 27 U/L (ref 0–37)
Albumin: 3.4 g/dL — ABNORMAL LOW (ref 3.5–5.2)
Alkaline Phosphatase: 55 U/L (ref 39–117)
BUN: 14 mg/dL (ref 6–23)
CALCIUM: 8.9 mg/dL (ref 8.4–10.5)
CO2: 23 mEq/L (ref 19–32)
Chloride: 104 mEq/L (ref 96–112)
Creatinine, Ser: 0.67 mg/dL (ref 0.50–1.10)
GFR calc non Af Amer: 77 mL/min — ABNORMAL LOW (ref >90)
GFR, EST AFRICAN AMERICAN: 90 mL/min — AB (ref >90)
GLUCOSE: 135 mg/dL — AB (ref 70–99)
Potassium: 3.3 mEq/L — ABNORMAL LOW (ref 3.7–5.3)
Sodium: 142 mEq/L (ref 137–147)
TOTAL PROTEIN: 7.6 g/dL (ref 6.0–8.3)
Total Bilirubin: 0.5 mg/dL (ref 0.3–1.2)

## 2013-09-17 LAB — PROTIME-INR
INR: 2.05 — AB (ref 0.00–1.49)
Prothrombin Time: 22.5 seconds — ABNORMAL HIGH (ref 11.6–15.2)

## 2013-09-17 LAB — GLUCOSE, CAPILLARY: GLUCOSE-CAPILLARY: 131 mg/dL — AB (ref 70–99)

## 2013-09-17 MED ORDER — DEXTROSE-NACL 5-0.45 % IV SOLN
INTRAVENOUS | Status: DC
  Administered 2013-09-17 (×2): via INTRAVENOUS

## 2013-09-17 MED ORDER — POTASSIUM CHLORIDE CRYS ER 20 MEQ PO TBCR
20.0000 meq | EXTENDED_RELEASE_TABLET | Freq: Two times a day (BID) | ORAL | Status: AC
  Administered 2013-09-17 – 2013-09-18 (×3): 20 meq via ORAL
  Filled 2013-09-17 (×5): qty 1

## 2013-09-17 MED ORDER — WARFARIN SODIUM 5 MG PO TABS
5.0000 mg | ORAL_TABLET | ORAL | Status: DC
  Administered 2013-09-17: 5 mg via ORAL
  Filled 2013-09-17 (×2): qty 1

## 2013-09-17 MED ORDER — PNEUMOCOCCAL VAC POLYVALENT 25 MCG/0.5ML IJ INJ
0.5000 mL | INJECTION | INTRAMUSCULAR | Status: AC
  Administered 2013-09-19: 0.5 mL via INTRAMUSCULAR
  Filled 2013-09-17 (×2): qty 0.5

## 2013-09-17 MED ORDER — WARFARIN SODIUM 2.5 MG PO TABS: 2.5000 mg | ORAL_TABLET | ORAL | Status: DC

## 2013-09-17 MED ORDER — ENSURE COMPLETE PO LIQD
237.0000 mL | ORAL | Status: DC
  Administered 2013-09-17 – 2013-09-20 (×4): 237 mL via ORAL

## 2013-09-17 NOTE — Evaluation (Signed)
Physical Therapy Evaluation Patient Details Name: Tanya Farmer MRN: [id number] DOB: 18-Apr-1926 Today's Date: 09/17/2013 Time: 1410-1444 PT Time Calculation (min): 34 min  PT Assessment / Plan / Recommendation History of Present Illness  80 y.o. female adm with UTI and weakness.  She has history of hypertension, hyperlipidemia, hypothyroidism, pvd, thrombocytosis, history of prior bilateral iliac femoral popliteal and tibial thrombectomies due to hypercoagulable state, is on the chronic Coumadin, follows hematologist for essential thrombocytosis, on hydroxyurea, and has dementia.    Clinical Impression  Pt admitted with above. Pt currently with functional limitations due to the deficits listed below (see PT Problem List).  Pt will benefit from skilled PT to increase their independence and safety with mobility. Feel pt needs ST-SNF prior to return home to decr burden of care on daughter who is struggling to meet incr physical demands of pt.     PT Assessment  Patient needs continued PT services    Follow Up Recommendations  SNF    Does the patient have the potential to tolerate intense rehabilitation      Barriers to Discharge Decreased caregiver support Daughter with neck and back problems and having difficulty managing recent incr in physical demands of caring for mother.    Equipment Recommendations  None recommended by PT    Recommendations for Other Services     Frequency Min 2X/week    Precautions / Restrictions Precautions Precautions: Fall   Pertinent Vitals/Pain No pain.      Mobility  Bed Mobility Overal bed mobility: Needs Assistance Bed Mobility: Supine to Sit Supine to sit: Min assist;HOB elevated General bed mobility comments: Verbal/tactile cues to initiate movement.  Assist to bring trunk up. Transfers Overall transfer level: Needs assistance Equipment used: 1 person hand held assist Transfers: Sit to/from Stand Sit to Stand: Min assist General transfer  comment: Assist to bring hips up and for balance due to posterior lean Ambulation/Gait Ambulation/Gait assistance: Min assist Ambulation Distance (Feet): 150 Feet Assistive device: 1 person hand held assist;None Gait Pattern/deviations: Step-through pattern;Decreased step length - right;Decreased step length - left;Leaning posteriorly;Shuffle General Gait Details: Verbal/tactile cues to decr posterior lean.    Exercises     PT Diagnosis: Difficulty walking;Generalized weakness  PT Problem List: Decreased strength;Decreased activity tolerance;Decreased balance;Decreased mobility;Decreased knowledge of use of DME;Decreased safety awareness PT Treatment Interventions: DME instruction;Gait training;Functional mobility training;Therapeutic activities;Therapeutic exercise;Balance training;Patient/family education     PT Goals(Current goals can be found in the care plan section) Acute Rehab PT Goals Patient Stated Goal: Pt didn't state.  Daughter would like pt to do more for herself. PT Goal Formulation: With family Time For Goal Achievement: 09/24/13 Potential to Achieve Goals: Good  Visit Information  Last PT Received On: 09/17/13 Assistance Needed: +1 History of Present Illness: 80 y.o. female adm with UTI and weakness.  She has history of hypertension, hyperlipidemia, hypothyroidism, pvd, thrombocytosis, history of prior bilateral iliac femoral popliteal and tibial thrombectomies due to hypercoagulable state, is on the chronic Coumadin, follows hematologist for essential thrombocytosis, on hydroxyurea, and has dementia.         Prior [place] expects to be discharged to:: Private residence Living Arrangements: Children Available Help at Discharge: Family;Personal care attendant;Available 24 hours/day Type of Home: House Home Layout: Two level;Bed/bath upstairs Alternate Level Stairs-Number of Steps: 18 Alternate Level Stairs-Rails: Right Prior  Function Level of Independence: Needs assistance Gait / Transfers Assistance Needed: Has needed assist over the last month Communication Communication: [facility]    Cognition  Cognition Arousal/Alertness: Awake/alert Behavior During Therapy: WFL for tasks assessed/performed Overall Cognitive Status: History of cognitive impairments - at baseline    Extremity/Trunk Assessment Upper Extremity Assessment Upper Extremity Assessment: Defer to OT evaluation Lower Extremity Assessment Lower Extremity Assessment: Generalized weakness   Balance Balance Overall balance assessment: Needs assistance Sitting-balance support: No upper extremity supported Sitting balance-Leahy Scale: Good Standing balance support: Single extremity supported Standing balance-Leahy Scale: Poor Standing balance comment: Posterior lean  End of Session PT - End of Session Equipment Utilized During Treatment: Gait belt Activity Tolerance: Patient tolerated treatment well Patient left: in chair;with call bell/phone within reach;with family/visitor present Nurse Communication: Mobility status  GP     Candon Caras 09/17/2013, 3:01 PM  [employer] PT [phone]

## 2013-09-17 NOTE — Progress Notes (Signed)
INITIAL NUTRITION ASSESSMENT  DOCUMENTATION CODES Per approved criteria  -Not Applicable   INTERVENTION: Ensure Complete po daily, each supplement provides 350 kcal and 13 grams of protein. RD to continue to follow nutrition care plan.  NUTRITION DIAGNOSIS: Inadequate oral intake related to acute illness as evidenced by variable oral intake.   Goal: Intake to meet >90% of estimated nutrition needs.  Monitor:  weight trends, lab trends, I/O's, PO intake, supplement tolerance  Reason for Assessment: RN Consult  80 y.o. female  Admitting Dx: UTI (lower urinary tract infection)  ASSESSMENT: PMHx significant for dementia, HTN. Admitted with weakness x 1 day. Work-up reveals UTI.  RN asked RD to see pt 2/2 pt reports that she drinks Ensure at home and would like it here. Pt without any significant weight changes PTA. Pt currently ordered for Dysphagia 3 diet with thin liquids.  Discussed weight hx with family at bedside. Pt without any significant appetite or weight changes PTA. Pt limits her salt intake at baseline. Drinks a Curator daily, family is agreeable to patient receiving while here.  Pt appears well-nourished.  Height: Ht Readings from Last 1 Encounters:  09/16/13 5\' 4"  (1.626 m)    Weight: Wt Readings from Last 1 Encounters:  09/16/13 130 lb (58.968 kg)    Ideal Body Weight: 120 lb  % Ideal Body Weight: 108%  Wt Readings from Last 10 Encounters:  09/16/13 130 lb (58.968 kg)  07/22/13 132 lb (59.875 kg)  09/18/12 133 lb (60.328 kg)  05/08/12 135 lb 3.2 oz (61.326 kg)    Usual Body Weight: 135 lb  % Usual Body Weight: 96%  BMI:  Body mass index is 22.3 kg/(m^2). Normal weight  Estimated Nutritional Needs: Kcal: 1400 - 1700 Protein: 60 - 70 g  Fluid: 1.5 - 1.8 liters  Skin: intact  Diet Order: Dysphagia 3; thin  EDUCATION NEEDS: -No education needs identified at this time   Intake/Output Summary (Last 24 hours) at 09/17/13  1504 Last data filed at 09/16/13 2200  Gross per 24 hour  Intake 561.67 ml  Output      0 ml  Net 561.67 ml    Last BM: PTA  Labs:   Recent Labs Lab 09/16/13 1440 09/17/13 0732  NA 143 142  K 4.0 3.3*  CL 103 104  CO2 26 23  BUN 20 14  CREATININE 0.76 0.67  CALCIUM 9.8 8.9  GLUCOSE 168* 135*    CBG (last 3)   Recent Labs  09/17/13 0800  GLUCAP 131*    Scheduled Meds: . atorvastatin  10 mg Oral Daily  . calcium-vitamin D  1 tablet Oral BID  . cefTRIAXone (ROCEPHIN)  IV  1 g Intravenous Q24H  . donepezil  10 mg Oral Daily  . hydroxyurea  1,000 mg Oral Q breakfast  . levothyroxine  75 mcg Oral QAC breakfast  . losartan  25 mg Oral Daily  . pantoprazole  40 mg Oral Daily  . [START ON 09/18/2013] pneumococcal 23 valent vaccine  0.5 mL Intramuscular Tomorrow-1000  . warfarin  5 mg Oral Custom   And  . [START ON 09/20/2013] warfarin  2.5 mg Oral Q Fri-1800  . Warfarin - Pharmacist Dosing Inpatient   Does not apply q1800    Continuous Infusions: . dextrose 5 % and 0.45% NaCl 75 mL/hr at 09/17/13 3267    Past Medical History  Diagnosis Date  . Hypertension   . Hyperlipidemia   . Thyroid disease     Hypothyroidism  .  GERD (gastroesophageal reflux disease)   . Arthritis     Degenerative  . Peripheral vascular disease     Left   Fem-Pop and  tibial Thrombectomy  . High platelet count   . Dementia   . Pain in toe of right foot   . Hypothyroidism     Past Surgical History  Procedure Laterality Date  . Femoral-popliteal bypass graft  2002    Left  Dr. Sherren Mocha Early  . Abdominal hysterectomy    . Cataract surgery      left eye    Inda Coke MS, RD, LDN Pager: [phone] After-hours pager: [phone]

## 2013-09-17 NOTE — Progress Notes (Signed)
ANTICOAGULATION CONSULT NOTE - Follow Up Consult  Pharmacy Consult for Coumadin Indication: hypercoagulable state  No Known Allergies  Patient Measurements: Height: 5\' 4"  (162.6 cm) Weight: 130 lb (58.968 kg) IBW/kg (Calculated) : 54.7  Vital Signs: Temp: 98.4 F (36.9 C) (01/20 0426) Temp src: Axillary (01/20 0426) BP: 164/92 mmHg (01/20 0552) Pulse Rate: 106 (01/20 0552)  Labs:  Recent Labs  09/16/13 1440 09/16/13 1658 09/17/13 0732  HGB 14.1  --  13.3  HCT 41.6  --  38.6  PLT 731*  --  706*  LABPROT  --  21.1* 22.5*  INR  --  1.89* 2.05*  CREATININE 0.76  --  0.67    Estimated Creatinine Clearance: 43.6 ml/min (by C-G formula based on Cr of 0.67).   Medications:  Scheduled:  . atorvastatin  10 mg Oral Daily  . calcium-vitamin D  1 tablet Oral BID  . cefTRIAXone (ROCEPHIN)  IV  1 g Intravenous Q24H  . donepezil  10 mg Oral Daily  . hydroxyurea  1,000 mg Oral Q breakfast  . levothyroxine  75 mcg Oral QAC breakfast  . losartan  25 mg Oral Daily  . pantoprazole  40 mg Oral Daily  . [START ON 09/18/2013] pneumococcal 23 valent vaccine  0.5 mL Intramuscular Tomorrow-1000  . Warfarin - Pharmacist Dosing Inpatient   Does not apply q1800    Assessment: 80 yo F on Coumadin PTA with therapeutic INR.  Will restart home regimen.  Goal of Therapy:  INR 2-3 Monitor platelets by anticoagulation protocol: Yes   Plan:  Coumadin 5mg  daily except 2.5mg  on Fridays Daily INR  [employer], Pharm.D., BCPS Clinical Pharmacist Pager [phone] 09/17/2013 11:59 AM

## 2013-09-17 NOTE — Clinical Social Work Psychosocial (Signed)
Clinical Social Work Department BRIEF PSYCHOSOCIAL ASSESSMENT 09/17/2013  Patient:  Tanya Farmer, Tanya Farmer     Account Number:  [id number]     Admit date:  09/16/2013  Clinical Social Worker:  Wylene Men  Date/Time:  09/17/2013 03:12 PM  Referred by:  Physician  Date Referred:  09/17/2013 Referred for  SNF Placement   Other Referral:   none   Interview type:  Other - See comment Other interview type:   pt is only oriented to self.  Assessment completed with daughter, Tanya Farmer    PSYCHOSOCIAL DATA Living Status:  FAMILY Admitted from facility:   Level of care:   Primary support name:  Tanya Farmer Primary support relationship to patient:  CHILD, ADULT Degree of support available:   adequate    CURRENT CONCERNS Current Concerns  Post-Acute Placement   Other Concerns:   none noted or reported by family    SOCIAL WORK ASSESSMENT / PLAN Pt is oriented x1.  CSW completed assessment with daughter, Tanya Farmer.  Daughter is hopeful that pt mental status will improve once UTI has been successfully treated.    Daughter states that pt lives with her prior to admission to hospital.  Pt daughter is agreeable to SNF placement and her first choice is [facility].  CSW will f/u with [facility].    Daughter states that she will also f/u with [facility] and tour the facility.  CSW advised daughter that it is best to be prepared for dc so when the MD says she is ready, pt will have a facility to dc to.  Pt daughter agreeable to have decision for SNF tomorrow.    Pt daughter agreeable to referral to SNFs in [facility].    Daughter is frustrated with pt aging and inability to be independent.  Pt daughter is used to mother taking care of her so the role reversals have found to be frustrating and less than enjoyable.  Pt daughter has medical issues that will prohibit her from fully taking care of her mother.  Pt daugher experiencing moderate guilt associated with placement.  CSW provided support and  normalized the situation.   Assessment/plan status:  Psychosocial Support/Ongoing Assessment of Needs Other assessment/ plan:   none   Information/referral to community resources:   SNF    PATIENT'S/FAMILY'S RESPONSE TO PLAN OF CARE: Pt daughter was appreciative of CSW suppot and assistance throughout the placement process.

## 2013-09-17 NOTE — Progress Notes (Signed)
UR completed. Mykenna Viele RN CCM Case Mgmt phone [phone] 

## 2013-09-17 NOTE — Clinical Social Work Placement (Signed)
Clinical Social Work Department CLINICAL SOCIAL WORK PLACEMENT NOTE 09/17/2013  Patient:  Tanya Farmer, Tanya Farmer  Account Number:  [id number] Admit date:  09/16/2013  Clinical Social Worker:  Wylene Men  Date/time:  09/17/2013 03:34 PM  Clinical Social Work is seeking post-discharge placement for this patient at the following level of care:   SKILLED NURSING   (*CSW will update this form in Epic as items are completed)   09/17/2013  Patient/family provided with [place] Department of Clinical Social Work's list of facilities offering this level of care within the geographic area requested by the patient (or if unable, by the patient's family).  09/17/2013  Patient/family informed of their freedom to choose among providers that offer the needed level of care, that participate in Medicare, Medicaid or managed care program needed by the patient, have an available bed and are willing to accept the patient.  09/17/2013  Patient/family informed of [facility]' ownership interest in [facility], as well as of the fact that they are under no obligation to receive care at this facility.  PASARR submitted to EDS on 09/17/2013 PASARR number received from EDS on 09/17/2013  FL2 transmitted to all facilities in geographic area requested by pt/family on  09/17/2013 FL2 transmitted to all facilities within larger geographic area on   Patient informed that his/her managed care company has contracts with or will negotiate with  certain facilities, including the following:     Patient/family informed of bed offers received:   Patient chooses bed at  Physician recommends and patient chooses bed at    Patient to be transferred to  on   Patient to be transferred to facility by   The following physician request were entered in Epic:   Additional Comments:

## 2013-09-17 NOTE — Progress Notes (Signed)
Subjective: No complaints  Objective: Vital signs in last 24 hours: Temp:  [98.3 F (36.8 C)-98.9 F (37.2 C)] 98.4 F (36.9 C) (01/20 0426) Pulse Rate:  [69-100] 100 (01/20 0426) Resp:  [13-19] 19 (01/19 1515) BP: (124-200)/(66-96) 182/78 mmHg (01/20 0452) SpO2:  [96 %-100 %] 96 % (01/20 0426) Weight:  [58.968 kg (130 lb)] 58.968 kg (130 lb) (01/19 1419) Weight change:     Intake/Output from previous day: 01/19 0701 - 01/20 0700 In: 561.7 [P.O.:120; I.V.:241.7; IV Piggyback:200] Out: -  Intake/Output this shift: Total I/O In: 561.7 [P.O.:120; I.V.:241.7; IV Piggyback:200] Out: -   General appearance: alert Head: some erythema of race and upper neck, not warm or tender Resp: clear to auscultation bilaterally Cardio: regular rate and rhythm, S1, S2 normal, no murmur, click, rub or gallop GI: soft, non-tender; bowel sounds normal; no masses,  no organomegaly Extremities: extremities normal, atraumatic, no cyanosis or edema  Lab Results:  Recent Labs  09/16/13 1440  WBC 6.1  HGB 14.1  HCT 41.6  PLT 731*   BMET  Recent Labs  09/16/13 1440  NA 143  K 4.0  CL 103  CO2 26  GLUCOSE 168*  BUN 20  CREATININE 0.76  CALCIUM 9.8    Studies/Results: Dg Chest 2 View  09/16/2013   CLINICAL DATA:  Weakness.  EXAM: CHEST  2 VIEW  COMPARISON:  07/05/2013.  FINDINGS: Mediastinum and hilar structures are normal. Cardiomegaly pulmonary vascular congestion. Mild infiltrates developing in the lung bases cannot be excluded. This could be secondary to pulmonary edema or pneumonia. No pleural effusion or pneumothorax. No acute osseous abnormality .  IMPRESSION: Stable cardiomegaly and pulmonary venous congestion. Mild infiltrates developing in the lung bases cannot be excluded. This could be secondary to mild pulmonary edema versus pneumonia.   Electronically Signed   By: Marcello Moores  Register   On: 09/16/2013 16:15    Medications: I have reviewed the patient's current  medications.  Assessment/Plan: *Principal Problem:   UTI (lower urinary tract infection)  rocephin 2.  Decrease IVFs.  Await culture, have PT see Active Problems:   Essential thrombocytosis platelets elevated   Dementia restart aricept   Facial erythema not sure of etiology, do not think this is cellulitis   Chronic anticoagulation per pharmacy     LOS: 1 day   Tanya Farmer JOSEPH 09/17/2013, 6:01 AM

## 2013-09-18 LAB — URINE CULTURE

## 2013-09-18 LAB — BASIC METABOLIC PANEL
BUN: 16 mg/dL (ref 6–23)
CALCIUM: 8.5 mg/dL (ref 8.4–10.5)
CO2: 22 mEq/L (ref 19–32)
Chloride: 105 mEq/L (ref 96–112)
Creatinine, Ser: 0.78 mg/dL (ref 0.50–1.10)
GFR calc Af Amer: 85 mL/min — ABNORMAL LOW (ref >90)
GFR, EST NON AFRICAN AMERICAN: 74 mL/min — AB (ref >90)
GLUCOSE: 98 mg/dL (ref 70–99)
Potassium: 3.7 mEq/L (ref 3.7–5.3)
SODIUM: 139 meq/L (ref 137–147)

## 2013-09-18 LAB — CBC WITH DIFFERENTIAL/PLATELET
Basophils Absolute: 0 10*3/uL (ref 0.0–0.1)
Basophils Relative: 1 % (ref 0–1)
Eosinophils Absolute: 0 10*3/uL (ref 0.0–0.7)
Eosinophils Relative: 0 % (ref 0–5)
HCT: 37.7 % (ref 36.0–46.0)
Hemoglobin: 12.8 g/dL (ref 12.0–15.0)
LYMPHS ABS: 0.9 10*3/uL (ref 0.7–4.0)
Lymphocytes Relative: 17 % (ref 12–46)
MCH: 37.3 pg — AB (ref 26.0–34.0)
MCHC: 34 g/dL (ref 30.0–36.0)
MCV: 109.9 fL — ABNORMAL HIGH (ref 78.0–100.0)
Monocytes Absolute: 0.5 10*3/uL (ref 0.1–1.0)
Monocytes Relative: 10 % (ref 3–12)
Neutro Abs: 4 10*3/uL (ref 1.7–7.7)
Neutrophils Relative %: 73 % (ref 43–77)
Platelets: 576 10*3/uL — ABNORMAL HIGH (ref 150–400)
RBC: 3.43 MIL/uL — AB (ref 3.87–5.11)
RDW: 15.4 % (ref 11.5–15.5)
WBC: 5.6 10*3/uL (ref 4.0–10.5)

## 2013-09-18 LAB — PROTIME-INR
INR: 2.8 — AB (ref 0.00–1.49)
Prothrombin Time: 28.5 seconds — ABNORMAL HIGH (ref 11.6–15.2)

## 2013-09-18 MED ORDER — WARFARIN SODIUM 1 MG PO TABS
1.0000 mg | ORAL_TABLET | Freq: Once | ORAL | Status: AC
  Administered 2013-09-18: 1 mg via ORAL
  Filled 2013-09-18: qty 1

## 2013-09-18 NOTE — Evaluation (Signed)
Occupational Therapy Evaluation Patient Details Name: Tanya Farmer MRN: [id number] DOB: 08-Mar-1926 Today's Date: 09/18/2013 Time: 1100-1200 OT Time Calculation (min): 60 min  OT Assessment / Plan / Recommendation History of present illness 80 y.o. female adm with UTI and weakness.  She has history of hypertension, hyperlipidemia, hypothyroidism, pvd, thrombocytosis, history of prior bilateral iliac femoral popliteal and tibial thrombectomies due to hypercoagulable state, is on the chronic Coumadin, follows hematologist for essential thrombocytosis, on hydroxyurea, and has dementia.     Clinical Impression   Pt requires min assist for OOB mobility and min to mod assist for ADL.  Pt limited by baseline dementia.  Daughter is not able to physically assist her mother in the home due to her own limitations.  Pt will need SNF level care upon d/c.  Daughter tearful about potential need to place her mother in memory care or assisted living long term. Will defer OT to SNF.    OT Assessment  All further OT needs can be met in the next venue of care    Follow Up Recommendations  SNF    Barriers to Discharge      Equipment Recommendations       Recommendations for Other Services    Frequency       Precautions / Restrictions Precautions Precautions: Fall Restrictions Weight Bearing Restrictions: No   Pertinent Vitals/Pain No pain, VSS    ADL  Eating/Feeding: Independent Where Assessed - Eating/Feeding: Bed level Grooming: Wash/dry hands;Supervision/safety Where Assessed - Grooming: Unsupported standing Upper Body Bathing: Minimal assistance Where Assessed - Upper Body Bathing: Unsupported sitting Lower Body Bathing: Moderate assistance Where Assessed - Lower Body Bathing: Unsupported sitting;Supported sit to stand Upper Body Dressing: Minimal assistance Where Assessed - Upper Body Dressing: Unsupported sitting Lower Body Dressing: Moderate assistance Where Assessed - Lower Body  Dressing: Unsupported sitting;Supported sit to stand Toilet Transfer: Minimal assistance Toilet Transfer Method: Sit to stand Toilet Transfer Equipment: Regular height toilet;Grab bars Toileting - Clothing Manipulation and Hygiene: Moderate assistance Where Assessed - Toileting Clothing Manipulation and Hygiene: Sit to stand from 3-in-1 or toilet Equipment Used: Gait belt Transfers/Ambulation Related to ADLs: min assist, decreased balance, no device used ADL Comments: pt needs verbal cues to continue activity    OT Diagnosis: Generalized weakness;Cognitive deficits  OT Problem List: Impaired balance (sitting and/or standing);Decreased activity tolerance;Decreased strength;Decreased cognition;Decreased knowledge of use of DME or AE OT Treatment Interventions:     OT Goals(Current goals can be found in the care plan section) Acute Rehab OT Goals Patient Stated Goal: Pt didn't state.  Daughter would like pt to do more for herself.  Visit Information  Last OT Received On: 09/18/13 Assistance Needed: +1 History of Present Illness: 80 y.o. female adm with UTI and weakness.  She has history of hypertension, hyperlipidemia, hypothyroidism, pvd, thrombocytosis, history of prior bilateral iliac femoral popliteal and tibial thrombectomies due to hypercoagulable state, is on the chronic Coumadin, follows hematologist for essential thrombocytosis, on hydroxyurea, and has dementia.         Prior Functioning     Home Living Family/patient expects to be discharged to:: Private residence Living Arrangements: Children Available Help at Discharge: Family;Personal care attendant;Available 24 hours/day Type of Home: House Home Access: Stairs to enter Home Layout: Two level;Bed/bath upstairs Alternate Level Stairs-Number of Steps: 18 Alternate Level Stairs-Rails: Right Home Equipment: Shower seat Prior Function Level of Independence: Needs assistance Gait / Transfers Assistance Needed: Has needed  assist over the last month ADL's / Homemaking Assistance Needed:  dependent in bathing, dressing, toileting and all IADL, pt did self feed Communication / Swallowing Assistance Needed: HOH Comments: Supervision needed due to dementia for safety Communication Communication: HOH Dominant Hand: Right         Vision/Perception Vision - History Baseline Vision: Wears glasses all the time   Cognition  Cognition Arousal/Alertness: Awake/alert (sleeps throughout the day on and off) Behavior During Therapy: WFL for tasks assessed/performed Overall Cognitive Status: History of cognitive impairments - at baseline    Extremity/Trunk Assessment Upper Extremity Assessment Upper Extremity Assessment: Overall WFL for tasks assessed Lower Extremity Assessment Lower Extremity Assessment: Defer to PT evaluation Cervical / Trunk Assessment Cervical / Trunk Assessment: Normal     Mobility Bed Mobility Overal bed mobility: Modified Independent Bed Mobility: Supine to Sit;Sit to Supine Supine to sit: Modified independent (Device/Increase time) Sit to supine: Modified independent (Device/Increase time) Transfers Overall transfer level: Needs assistance Equipment used: 1 person hand held assist Transfers: Sit to/from Stand Sit to Stand: Min assist General transfer comment: Assist to bring hips up and for balance due to posterior lean     Exercise     Balance Balance Sitting balance-Leahy Scale: Good Standing balance support: During functional activity;No upper extremity supported Standing balance-Leahy Scale: Fair   End of Session OT - End of Session Activity Tolerance: Patient tolerated treatment well Patient left: in bed;with call bell/phone within reach;with chair alarm set;with nursing/sitter in room;with family/visitor present  GO     Malka So 09/18/2013, 12:11 PM [phone]

## 2013-09-18 NOTE — Progress Notes (Signed)
Subjective: No complaints  Objective: Vital signs in last 24 hours: Temp:  [97.4 F (36.3 C)-99.6 F (37.6 C)] 99.6 F (37.6 C) (01/21 0423) Pulse Rate:  [70-90] 70 (01/21 0423) Resp:  [16] 16 (01/21 0423) BP: (139-175)/(75-87) 175/87 mmHg (01/21 0423) SpO2:  [98 %-99 %] 99 % (01/21 0423) Weight change:  Last BM Date:  (PTA)  Intake/Output from previous day:   Intake/Output this shift:    General appearance: alert and cooperative, oriented x 1. Resp: clear to auscultation bilaterally Cardio: regular rate and rhythm, S1, S2 normal, no murmur, click, rub or gallop GI: soft, non-tender; bowel sounds normal; no masses,  no organomegaly Extremities: extremities normal, atraumatic, no cyanosis or edema  Lab Results:  Recent Labs  09/16/13 1440 09/17/13 0732  WBC 6.1 5.9  HGB 14.1 13.3  HCT 41.6 38.6  PLT 731* 706*   BMET  Recent Labs  09/16/13 1440 09/17/13 0732  NA 143 142  K 4.0 3.3*  CL 103 104  CO2 26 23  GLUCOSE 168* 135*  BUN 20 14  CREATININE 0.76 0.67  CALCIUM 9.8 8.9    Studies/Results: Dg Chest 2 View  09/16/2013   CLINICAL DATA:  Weakness.  EXAM: CHEST  2 VIEW  COMPARISON:  07/05/2013.  FINDINGS: Mediastinum and hilar structures are normal. Cardiomegaly pulmonary vascular congestion. Mild infiltrates developing in the lung bases cannot be excluded. This could be secondary to pulmonary edema or pneumonia. No pleural effusion or pneumothorax. No acute osseous abnormality .  IMPRESSION: Stable cardiomegaly and pulmonary venous congestion. Mild infiltrates developing in the lung bases cannot be excluded. This could be secondary to mild pulmonary edema versus pneumonia.   Electronically Signed   By: Marcello Moores  Register   On: 09/16/2013 16:15    Medications: I have reviewed the patient's current medications.  Assessment/Plan: Principal Problem:  UTI (lower urinary tract infection) rocephin . D/C IVFs. Awaiting sensitivities then change to po antibiotics.   CXR suggest possible early infiltrates Active Problems:  Essential thrombocytosis platelets stable Dementia  aricept  Facial erythema not sure of etiology, do not think this is cellulitis, it is starting to recede  Chronic anticoagulation per pharmacy Disposition  Short term NHP for rehab    LOS: 2 days   Tanya Farmer 09/18/2013, 7:20 AM

## 2013-09-18 NOTE — Clinical Social Work Placement (Signed)
Clinical Social Work Department CLINICAL SOCIAL WORK PLACEMENT NOTE 09/18/2013  Patient:  NATE, PERRI  Account Number:  [id number] Admit date:  09/16/2013  Clinical Social Worker:  Wylene Men  Date/time:  09/17/2013 03:34 PM  Clinical Social Work is seeking post-discharge placement for this patient at the following level of care:   SKILLED NURSING   (*CSW will update this form in Epic as items are completed)   09/17/2013  Patient/family provided with [place] Department of Clinical Social Work's list of facilities offering this level of care within the geographic area requested by the patient (or if unable, by the patient's family).  09/17/2013  Patient/family informed of their freedom to choose among providers that offer the needed level of care, that participate in Medicare, Medicaid or managed care program needed by the patient, have an available bed and are willing to accept the patient.  09/17/2013  Patient/family informed of [facility]' ownership interest in [facility], as well as of the fact that they are under no obligation to receive care at this facility.  PASARR submitted to EDS on 09/17/2013 PASARR number received from EDS on 09/17/2013  FL2 transmitted to all facilities in geographic area requested by pt/family on  09/17/2013 FL2 transmitted to all facilities within larger geographic area on   Patient informed that his/her managed care company has contracts with or will negotiate with  certain facilities, including the following:     Patient/family informed of bed offers received:  09/18/2013 Patient chooses bed at [place] Physician recommends and patient chooses bed at    Patient to be transferred to  on   Patient to be transferred to facility by   The following physician request were entered in Epic:   Additional Comments: Daughter given bed offers. She really wants [facility] but facility is saying no at this time. Encouraged daughter to  look at other facilities as options. [facility] did make offer and daughter states she would go with this facility if necessary.   Liz Beach, [place], [place], [phone]

## 2013-09-18 NOTE — Progress Notes (Signed)
ANTICOAGULATION CONSULT NOTE - Follow Up Consult  Pharmacy Consult for Coumadin Indication: hypercoagulable state  No Known Allergies  Patient Measurements: Height: 5\' 4"  (162.6 cm) Weight: 130 lb (58.968 kg) IBW/kg (Calculated) : 54.7  Vital Signs: Temp: 99.6 F (37.6 C) (01/21 0423) Temp src: Oral (01/21 0423) BP: 175/87 mmHg (01/21 0423) Pulse Rate: 70 (01/21 0423)  Labs:  Recent Labs  09/16/13 1440 09/16/13 1658 09/17/13 0732 09/18/13 0710  HGB 14.1  --  13.3 12.8  HCT 41.6  --  38.6 37.7  PLT 731*  --  706* 576*  LABPROT  --  21.1* 22.5* 28.5*  INR  --  1.89* 2.05* 2.80*  CREATININE 0.76  --  0.67 0.78    Estimated Creatinine Clearance: 43.6 ml/min (by C-G formula based on Cr of 0.78).   Medications:  Scheduled:  . atorvastatin  10 mg Oral Daily  . calcium-vitamin D  1 tablet Oral BID  . cefTRIAXone (ROCEPHIN)  IV  1 g Intravenous Q24H  . donepezil  10 mg Oral Daily  . feeding supplement (ENSURE COMPLETE)  237 mL Oral Q24H  . hydroxyurea  1,000 mg Oral Q breakfast  . levothyroxine  75 mcg Oral QAC breakfast  . losartan  25 mg Oral Daily  . pantoprazole  40 mg Oral Daily  . pneumococcal 23 valent vaccine  0.5 mL Intramuscular Tomorrow-1000  . potassium chloride  20 mEq Oral BID  . warfarin  5 mg Oral Custom   And  . [START ON 09/20/2013] warfarin  2.5 mg Oral Q Fri-1800  . Warfarin - Pharmacist Dosing Inpatient   Does not apply q1800    Assessment: 80 yo F on Coumadin PTA with therapeutic INR.  INR has jumped on home regimen - likely related to decreased intake, acute illness, and antibiotic drug interactions.  Will reduce Coumadin dose tonight.  Goal of Therapy:  INR 2-3 Monitor platelets by anticoagulation protocol: Yes   Plan:  Coumadin 1mg  PO x 1 tonight. Continue daily INR  [employer], Pharm.D., BCPS Clinical Pharmacist Pager [phone] 09/18/2013 11:33 AM

## 2013-09-19 LAB — PROTIME-INR
INR: 2.32 — AB (ref 0.00–1.49)
Prothrombin Time: 24.7 seconds — ABNORMAL HIGH (ref 11.6–15.2)

## 2013-09-19 MED ORDER — LOSARTAN POTASSIUM 50 MG PO TABS
50.0000 mg | ORAL_TABLET | Freq: Every day | ORAL | Status: DC
  Administered 2013-09-19 – 2013-09-20 (×2): 50 mg via ORAL
  Filled 2013-09-19 (×2): qty 1

## 2013-09-19 MED ORDER — WARFARIN SODIUM 2.5 MG PO TABS
2.5000 mg | ORAL_TABLET | Freq: Once | ORAL | Status: AC
  Administered 2013-09-19: 2.5 mg via ORAL
  Filled 2013-09-19: qty 1

## 2013-09-19 MED ORDER — LEVOFLOXACIN 500 MG PO TABS
500.0000 mg | ORAL_TABLET | Freq: Every day | ORAL | Status: AC
  Administered 2013-09-19 – 2013-09-20 (×2): 500 mg via ORAL
  Filled 2013-09-19 (×2): qty 1

## 2013-09-19 NOTE — Progress Notes (Signed)
ANTICOAGULATION CONSULT NOTE - Follow Up Consult  Pharmacy Consult for Coumadin Indication: hypercoagulable state  No Known Allergies  Patient Measurements: Height: 5\' 4"  (162.6 cm) Weight: 130 lb (58.968 kg) IBW/kg (Calculated) : 54.7  Vital Signs: Temp: 98.6 F (37 C) (01/22 0640) Temp src: Oral (01/22 0640) BP: 160/96 mmHg (01/22 0640) Pulse Rate: 60 (01/22 0640)  Labs:  Recent Labs  09/16/13 1440  09/17/13 0732 09/18/13 0710 09/19/13 0343  HGB 14.1  --  13.3 12.8  --   HCT 41.6  --  38.6 37.7  --   PLT 731*  --  706* 576*  --   LABPROT  --   < > 22.5* 28.5* 24.7*  INR  --   < > 2.05* 2.80* 2.32*  CREATININE 0.76  --  0.67 0.78  --   < > = values in this interval not displayed.  Estimated Creatinine Clearance: 43.6 ml/min (by C-G formula based on Cr of 0.78).   Medications:  Scheduled:  . atorvastatin  10 mg Oral Daily  . calcium-vitamin D  1 tablet Oral BID  . donepezil  10 mg Oral Daily  . feeding supplement (ENSURE COMPLETE)  237 mL Oral Q24H  . hydroxyurea  1,000 mg Oral Q breakfast  . levofloxacin  500 mg Oral Daily  . levothyroxine  75 mcg Oral QAC breakfast  . losartan  50 mg Oral Daily  . pantoprazole  40 mg Oral Daily  . Warfarin - Pharmacist Dosing Inpatient   Does not apply q1800    Assessment: 80 yo F on Coumadin PTA with therapeutic INR.  INR has been variable since admission - likely related to decreased intake, acute illness, and antibiotic drug interactions.  Noted antibiotics to end 1/23 at which time patient can likely restart home regimen.  Goal of Therapy:  INR 2-3 Monitor platelets by anticoagulation protocol: Yes   Plan:  Coumadin 2.5 mg PO x 1 tonight. Continue daily INR  [employer], Pharm.D., BCPS Clinical Pharmacist Pager [phone] 09/19/2013 12:42 PM

## 2013-09-19 NOTE — Clinical Social Work Note (Signed)
CSW notified [facility] that daughter would like patient to come to their facility.  Liz Beach, [place], [place], [phone]

## 2013-09-19 NOTE — Clinical Social Work Note (Signed)
CSW returned daughter's call, but had to leave message. CSW waiting for daughter's call back. Daughter has had many questions about SNF that can only be answered by the facility she has chosen. CSW has explained that once she has chosen facility she just needs to complete paperwork and CSW will facilitate patient's discharge to facility when stable.   Liz Beach, [place], [place], [phone]

## 2013-09-19 NOTE — Progress Notes (Signed)
Subjective: No complaints  Objective: Vital signs in last 24 hours: Temp:  [98.3 F (36.8 C)-98.9 F (37.2 C)] 98.6 F (37 C) (01/22 0640) Pulse Rate:  [60-78] 60 (01/22 0640) Resp:  [16] 16 (01/22 0640) BP: (150-185)/(78-114) 160/96 mmHg (01/22 0640) SpO2:  [99 %-100 %] 100 % (01/22 0640) Weight change:  Last BM Date: 09/18/13 (small smear)  Intake/Output from previous day:   Intake/Output this shift:    General appearance: alert and cooperative GI: soft, non-tender; bowel sounds normal; no masses,  no organomegaly  Lab Results:  Recent Labs  09/17/13 0732 09/18/13 0710  WBC 5.9 5.6  HGB 13.3 12.8  HCT 38.6 37.7  PLT 706* 576*   BMET  Recent Labs  09/17/13 0732 09/18/13 0710  NA 142 139  K 3.3* 3.7  CL 104 105  CO2 23 22  GLUCOSE 135* 98  BUN 14 16  CREATININE 0.67 0.78  CALCIUM 8.9 8.5    Studies/Results: No results found.  Medications: I have reviewed the patient's current medications.  Assessment/Plan: Principal Problem:  UTI (lower urinary tract infection)D/C rocephin, change to levaquin to cover uti and possible pneumonis Active Problems:  Hypertension not well controlled, increase losartan dose Essential thrombocytosis platelets stable  Dementia aricept  Facial erythema not sure of etiology, improving Chronic anticoagulation per pharmacy  Disposition Short term NHP for rehab, will be ready for discharge on 1/23    LOS: 3 days   Tanya Farmer JOSEPH 09/19/2013, 8:00 AM

## 2013-09-20 LAB — PROTIME-INR
INR: 1.64 — AB (ref 0.00–1.49)
PROTHROMBIN TIME: 19 s — AB (ref 11.6–15.2)

## 2013-09-20 MED ORDER — WARFARIN SODIUM 2.5 MG PO TABS
2.5000 mg | ORAL_TABLET | Freq: Once | ORAL | Status: AC
  Administered 2013-09-20: 2.5 mg via ORAL
  Filled 2013-09-20: qty 1

## 2013-09-20 MED ORDER — LOSARTAN POTASSIUM 50 MG PO TABS: 50.0000 mg | ORAL_TABLET | Freq: Every day | ORAL | Status: DC

## 2013-09-20 MED ORDER — ENSURE COMPLETE PO LIQD: 237.0000 mL | ORAL | Status: DC

## 2013-09-20 NOTE — Progress Notes (Signed)
Patient dischrge to [facility] report  Called IV removed. Patient skin intact and patient d/c via PTAR

## 2013-09-20 NOTE — Care Management Note (Signed)
    Page 1 of 1   09/20/2013     5:34:10 PM   CARE MANAGEMENT NOTE 09/20/2013  Patient:  Tanya Farmer, Tanya Farmer   Account Number:  [id number]  Date Initiated:  09/20/2013  Documentation initiated by:  Tomi Bamberger  Subjective/Objective Assessment:   dx uti  admit- lives with daughter.     Action/Plan:   Anticipated DC Date:  09/20/2013   Anticipated DC Plan:  SKILLED NURSING FACILITY  In-house referral  Clinical Social Worker      DC Planning Services  CM consult      Choice offered to / List presented to:             Status of service:  Completed, signed off Medicare Important Message given?   (If response is "NO", the following Medicare IM given date fields will be blank) Date Medicare IM given:   Date Additional Medicare IM given:    Discharge Disposition:  [facility]  Per UR Regulation:  Reviewed for med. necessity/level of care/duration of stay  If discussed at [place] of Stay Meetings, dates discussed:    Comments:

## 2013-09-20 NOTE — Discharge Summary (Signed)
Physician Discharge Summary  Patient ID: Tanya Farmer MRN: [id number] DOB/AGE: December 26, 1925 80 y.o.  Admit date: 09/16/2013 Discharge date: 09/20/2013  Admission Diagnoses: Urinary tract infection Pulmonary infiltrates Essential thrombocytosis Dementia Facial erythema Hypertension Chronic anticoagulation   Discharge Diagnoses:  Principal Problem:   UTI (lower urinary tract infection) Active Problems:   Essential thrombocytosis   Dementia   Facial erythema   Chronic anticoagulation Hyperlipidemia Peripheral vascular disease Gastroesophageal reflux disease Hypothyroidism DJD  Discharged Condition: good  Hospital Course: The patient was admitted on January 19 presented with generalized weakness 24 hours duration with some sweating, incontinence and then nausea and vomiting. The patient's urinalysis showed clear UTI. Chest x-ray suggested basilar infiltrates. She was admitted and placed on Rocephin. Within 48 hours she was feeling much better with better strength and clear mental status. Her urine culture showed Proteus Mirabal is pansensitive and she was switched to levofloxacin to cover both UTI and possible pneumonia. Her blood pressure was running in the 166-063 systolic range and her losartan dose was increased. Anticoagulation was managed by pharmacy. Physical and occupational therapy consultations recommended short-term nursing home placement for rehabilitation. She does have advancing dementia and may need assisted living after discharge from nursing home for long-term care, this will depend on the desires of her family.  Consults: None  Significant Diagnostic Studies: labs: INR discharge 1.64, sodium 139, potassium 3.7, chloride 105, bicarbonate 22, BUN 16, creatinine 0.7, WC 5.6, hemoglobin 12.8, platelets count 576,000 microbiology: urine culture: positive for Proteus Mirabalis and radiology: CXR: infiltrates: lower lobe bilaterally  Treatments: IV hydration, antibiotics:  Levaquin and ceftriaxone and therapies: PT and OT  Discharge Exam: Blood pressure 122/85, pulse 114, temperature 97.8 F (36.6 C), temperature source Oral, resp. rate 20, height 5\' 4"  (1.626 m), weight 58.968 kg (130 lb), SpO2 95.00%. General appearance: alert and cooperative Resp: clear to auscultation bilaterally Cardio: regular rate and rhythm, S1, S2 normal, no murmur, click, rub or gallop GI: soft, non-tender; bowel sounds normal; no masses,  no organomegaly  Disposition: 01-Home or Self Care   Future Appointments Provider Department Dept Phone   10/02/2013 4:00 PM [facility] [place] Office [phone]       Medication List         atorvastatin 10 MG tablet  Commonly known as:  LIPITOR  Take 10 mg by mouth daily.     calcium-vitamin D 500-200 MG-UNIT per tablet  Commonly known as:  OSCAL WITH D  Take 1 tablet by mouth 2 (two) times daily.     donepezil 10 MG tablet  Commonly known as:  ARICEPT  Take 10 mg by mouth daily.     feeding supplement (ENSURE COMPLETE) Liqd  Take 237 mLs by mouth daily.     fish oil-omega-3 fatty acids 1000 MG capsule  Take 1 g by mouth daily.     hydroxyurea 500 MG capsule  Commonly known as:  HYDREA  Take 1,000 mg by mouth daily. May take with food to minimize GI side effects.     levothyroxine 75 MCG tablet  Commonly known as:  SYNTHROID  Take 1 tablet (75 mcg total) by mouth daily before breakfast.     losartan 50 MG tablet  Commonly known as:  COZAAR  Take 1 tablet (50 mg total) by mouth daily.     multivitamin with minerals Tabs tablet  Take 1 tablet by mouth daily.     pantoprazole 40 MG tablet  Commonly known as:  PROTONIX  Take 40 mg by mouth daily.     traZODone 50 MG tablet  Commonly known as:  DESYREL  Take 25 mg by mouth at bedtime as needed for sleep.     warfarin 5 MG tablet  Commonly known as:  COUMADIN  Take 2.5-5 mg by mouth daily. Take 1 tablet daily except on Friday take  1/2 tablet           Follow-up Information   Follow up with Irven Shelling, MD In 6 weeks. (after discharge from rehab)    Specialty:  Internal Medicine   Contact information:   [address] [place] [address] [phone]       Signed: Irven Shelling 09/20/2013, 7:57 AM

## 2013-09-20 NOTE — Progress Notes (Signed)
Subjective: No complaints  Objective: Vital signs in last 24 hours: Temp:  [97.8 F (36.6 C)-98.6 F (37 C)] 97.8 F (36.6 C) (01/23 0412) Pulse Rate:  [54-114] 114 (01/23 0515) Resp:  [18-20] 20 (01/23 0515) BP: (122-176)/(78-98) 122/85 mmHg (01/23 0515) SpO2:  [95 %-100 %] 95 % (01/23 0515) Weight change:  Last BM Date: 09/19/13  Intake/Output from previous day: 01/22 0701 - 01/23 0700 In: 300 [P.O.:300] Out: -  Intake/Output this shift:    General appearance: alert  Lab Results:  Recent Labs  09/18/13 0710  WBC 5.6  HGB 12.8  HCT 37.7  PLT 576*   BMET  Recent Labs  09/18/13 0710  NA 139  K 3.7  CL 105  CO2 22  GLUCOSE 98  BUN 16  CREATININE 0.78  CALCIUM 8.5    Studies/Results: No results found.  Medications: I have reviewed the patient's current medications.  Assessment/Plan: Principal Problem:  UTI (lower urinary tract infection) complete Levaquin today Active Problems:  Hypertension losartan dose increased  Essential thrombocytosis platelets stable  Dementia aricept  Facial erythema not sure of etiology, improving  Chronic anticoagulation per pharmacy  Disposition Short term NHP for rehab, medically ready for discharge    LOS: 4 days   Tanya Farmer 09/20/2013, 8:03 AM

## 2013-09-20 NOTE — Progress Notes (Signed)
ANTICOAGULATION CONSULT NOTE - Follow Up Consult  Pharmacy Consult for Coumadin Indication: hypercoagulable state  No Known Allergies  Patient Measurements: Height: 5\' 4"  (162.6 cm) Weight: 130 lb (58.968 kg) IBW/kg (Calculated) : 54.7  Vital Signs: Temp: 97.8 F (36.6 C) (01/23 0412) Temp src: Oral (01/23 0412) BP: 122/85 mmHg (01/23 0515) Pulse Rate: 114 (01/23 0515)  Labs:  Recent Labs  09/18/13 0710 09/19/13 0343 09/20/13 0528  HGB 12.8  --   --   HCT 37.7  --   --   PLT 576*  --   --   LABPROT 28.5* 24.7* 19.0*  INR 2.80* 2.32* 1.64*  CREATININE 0.78  --   --     Estimated Creatinine Clearance: 43.6 ml/min (by C-G formula based on Cr of 0.78).    Assessment: 80 yo F on Coumadin PTA with therapeutic INR.  INR has been variable since admission - likely related to decreased intake, acute illness, and antibiotic drug interactions.  Noted antibiotics to end 1/23 at which time patient can likely restart home regimen. Today her INR is subtherapeutic at 1.64.  MD had written orders to DC to SNF for short-term rehab on home dose of 5 mg daily X 2.5 mg on Fridays.  Goal of Therapy:  INR 2-3 Monitor platelets by anticoagulation protocol: Yes   Plan:  MD has resumed home dose of 5 mg daily with 2.5 on Fridays to start at [facility], Pharm.D. [phone] 09/20/2013 11:13 AM

## 2013-09-21 NOTE — Clinical Social Work Placement (Signed)
Clinical Social Work Department CLINICAL SOCIAL WORK PLACEMENT NOTE 09/21/2013  Patient:  ADRIANAH, PROPHETE  Account Number:  [id number] Admit date:  09/16/2013  Clinical Social Worker:  Wylene Men  Date/time:  09/17/2013 03:34 PM  Clinical Social Work is seeking post-discharge placement for this patient at the following level of care:   SKILLED NURSING   (*CSW will update this form in Epic as items are completed)   09/17/2013  Patient/family provided with [place] Department of Clinical Social Work's list of facilities offering this level of care within the geographic area requested by the patient (or if unable, by the patient's family).  09/17/2013  Patient/family informed of their freedom to choose among providers that offer the needed level of care, that participate in Medicare, Medicaid or managed care program needed by the patient, have an available bed and are willing to accept the patient.  09/17/2013  Patient/family informed of [facility]' ownership interest in [facility], as well as of the fact that they are under no obligation to receive care at this facility.  PASARR submitted to EDS on 09/17/2013 PASARR number received from EDS on 09/17/2013  FL2 transmitted to all facilities in geographic area requested by pt/family on  09/17/2013 FL2 transmitted to all facilities within larger geographic area on   Patient informed that his/her managed care company has contracts with or will negotiate with  certain facilities, including the following:     Patient/family informed of bed offers received:  09/18/2013 Patient chooses bed at [facility] Physician recommends and patient chooses bed at    Patient to be transferred to [facility] on  09/20/2013 Patient to be transferred to facility by Ambulance  The following physician request were entered in Epic:   Additional Comments: Per MD patient ready to DC to [facility] 09/20/12. RN, daughter, and facility  notified of DC. [facility] transport requested for patient. RN given number for report. DC packet left on chart. CSW signing off.   Liz Beach, [place], [place], [phone]

## 2013-09-23 ENCOUNTER — Non-Acute Institutional Stay (SKILLED_NURSING_FACILITY): Payer: Medicare Other | Admitting: Adult Health

## 2013-09-23 ENCOUNTER — Encounter: Payer: Self-pay | Admitting: Adult Health

## 2013-09-23 DIAGNOSIS — Z7901 Long term (current) use of anticoagulants: Secondary | ICD-10-CM

## 2013-09-23 DIAGNOSIS — I749 Embolism and thrombosis of unspecified artery: Secondary | ICD-10-CM

## 2013-09-23 DIAGNOSIS — I10 Essential (primary) hypertension: Secondary | ICD-10-CM

## 2013-09-23 DIAGNOSIS — K219 Gastro-esophageal reflux disease without esophagitis: Secondary | ICD-10-CM

## 2013-09-23 DIAGNOSIS — R5381 Other malaise: Secondary | ICD-10-CM

## 2013-09-23 DIAGNOSIS — F0390 Unspecified dementia without behavioral disturbance: Secondary | ICD-10-CM | POA: Insufficient documentation

## 2013-09-23 DIAGNOSIS — R531 Weakness: Secondary | ICD-10-CM | POA: Insufficient documentation

## 2013-09-23 DIAGNOSIS — E785 Hyperlipidemia, unspecified: Secondary | ICD-10-CM

## 2013-09-23 DIAGNOSIS — E039 Hypothyroidism, unspecified: Secondary | ICD-10-CM

## 2013-09-23 DIAGNOSIS — R5383 Other fatigue: Secondary | ICD-10-CM

## 2013-09-23 DIAGNOSIS — D473 Essential (hemorrhagic) thrombocythemia: Secondary | ICD-10-CM

## 2013-09-23 NOTE — Progress Notes (Signed)
Patient ID: Tanya Farmer, female   DOB: 08/20/1926, 80 y.o.   MRN: [id number]     [facility]  No Known Allergies   Chief Complaint  Patient presents with  . Hospitalization Follow-up    HPI:  She has been hospitalized for weakness and uti. She is here for short term rehab. She is unable to fully participate in the hpi or ros due her dementia; but is not complaining of pain or discomfort.    Past Medical History  Diagnosis Date  . Hypertension   . Hyperlipidemia   . Thyroid disease     Hypothyroidism  . GERD (gastroesophageal reflux disease)   . Arthritis     Degenerative  . Peripheral vascular disease     Left   Fem-Pop and  tibial Thrombectomy  . High platelet count   . Dementia   . Pain in toe of right foot   . Hypothyroidism     Past Surgical History  Procedure Laterality Date  . Femoral-popliteal bypass graft  2002    Left  Dr. Sherren Mocha Early  . Abdominal hysterectomy    . Cataract surgery      left eye    VITAL SIGNS BP 122/67  Pulse 67  Ht 5\' 4"  (1.626 m)  Wt 127 lb (57.607 kg)  BMI 21.79 kg/m2   Patient's Medications  New Prescriptions   No medications on file  Previous Medications   ATORVASTATIN (LIPITOR) 10 MG TABLET    Take 10 mg by mouth daily.   CALCIUM-VITAMIN D (OSCAL WITH D) 500-200 MG-UNIT PER TABLET    Take 1 tablet by mouth 2 (two) times daily.   DONEPEZIL (ARICEPT) 10 MG TABLET    Take 10 mg by mouth daily.    FEEDING SUPPLEMENT, ENSURE COMPLETE, (ENSURE COMPLETE) LIQD    Take 237 mLs by mouth daily.   FISH OIL-OMEGA-3 FATTY ACIDS 1000 MG CAPSULE    Take 1 g by mouth daily.   HYDROXYUREA (HYDREA) 500 MG CAPSULE    Take 1,000 mg by mouth daily. May take with food to minimize GI side effects.   LEVOTHYROXINE (SYNTHROID) 75 MCG TABLET    Take 1 tablet (75 mcg total) by mouth daily before breakfast.   LOSARTAN (COZAAR) 50 MG TABLET    Take 1 tablet (50 mg total) by mouth daily.   MULTIPLE VITAMIN (MULTIVITAMIN WITH MINERALS) TABS TABLET     Take 1 tablet by mouth daily.   PANTOPRAZOLE (PROTONIX) 40 MG TABLET    Take 40 mg by mouth daily.   TRAZODONE (DESYREL) 50 MG TABLET    Take 25 mg by mouth at bedtime as needed for sleep.    WARFARIN (COUMADIN) 5 MG TABLET    Take 6 mg by mouth daily.   Modified Medications   No medications on file  Discontinued Medications   No medications on file    SIGNIFICANT DIAGNOSTIC EXAMS  08-20-13: mri of brain Moderate motion artifact.  No evidence of acute infarct.  09-16-13: chest x-ray: Stable cardiomegaly and pulmonary venous congestion. Mild infiltrates developing in the lung bases cannot be excluded. This could be secondary to mild pulmonary edema versus pneumonia.    LABS REVIEWED:  07-05-13: tsh 0.032; vit b12: 913; folate >20 09-16-13: wbc 6.1; hgb 14.1; hct 41.6; mcv 112.1; plt 731; glucose 168; bun 20; creat 0.78; k+4.0; na++143; t protein 8.6; albumin 3.7; BNP 619.3 Urine culture: p mirabilis:  09-18-13; wbc 5.6; hgb 12.8; hct 37.7; mcv 109.9; plt 576; glucose  98; bun 16; creat 0.78; k+3.7; na++139     Review of Systems  Unable to perform ROS    Physical Exam  Constitutional: No distress.  thin  Neck: Neck supple. No JVD present.  Cardiovascular: Normal rate and intact distal pulses.   Has an occasional irregular beat   Respiratory: Effort normal and breath sounds normal. No respiratory distress. She has no wheezes.  GI: Soft. Bowel sounds are normal. She exhibits no distension. There is no tenderness.  Musculoskeletal: She exhibits edema.  Is able to move all extremities; has trace lower extremity edema.   Neurological: She is alert.  Skin: Skin is warm and dry. She is not diaphoretic.       ASSESSMENT/ PLAN:  1. embolism and thrombosis/anticoagulation therapy: for her inr of 1.6 will increase her coumadin to 7 mg daily and will check inr on Wednesday; will monitor  2. Thrombocytosis: is stable will continue hydrea 1 gm daily and will monitor and will monitor  her status   3. Hypertension: is stable will continue cozaar 50 mg daily and will monitor  4. Hypothyroidism: will continue synthroid 75 mcg daily   5. Dyslipidemia: will continue lipitor 10 mg daily and fish oil 1 gm daily   6. Dementia: no change in status will continue aricept 10 mg daily  7. Gerd: will continue protonix 40 mg daily   8. Uti: has completed treatment and will monitor her status.   9. Weakness: will continue therapy as directed; will continue to monitor her status; will make further changes as indicated.   Time spent with patient 50 minutes.

## 2013-09-25 ENCOUNTER — Non-Acute Institutional Stay (SKILLED_NURSING_FACILITY): Payer: Medicare Other | Admitting: Adult Health

## 2013-09-25 DIAGNOSIS — I749 Embolism and thrombosis of unspecified artery: Secondary | ICD-10-CM

## 2013-09-25 DIAGNOSIS — Z7901 Long term (current) use of anticoagulants: Secondary | ICD-10-CM

## 2013-09-26 ENCOUNTER — Encounter: Payer: Self-pay | Admitting: Internal Medicine

## 2013-09-26 ENCOUNTER — Non-Acute Institutional Stay (SKILLED_NURSING_FACILITY): Payer: Medicare Other | Admitting: Internal Medicine

## 2013-09-26 DIAGNOSIS — R5381 Other malaise: Secondary | ICD-10-CM

## 2013-09-26 DIAGNOSIS — I749 Embolism and thrombosis of unspecified artery: Secondary | ICD-10-CM

## 2013-09-26 DIAGNOSIS — I10 Essential (primary) hypertension: Secondary | ICD-10-CM

## 2013-09-26 DIAGNOSIS — R531 Weakness: Secondary | ICD-10-CM

## 2013-09-26 DIAGNOSIS — N390 Urinary tract infection, site not specified: Secondary | ICD-10-CM

## 2013-09-26 DIAGNOSIS — F0390 Unspecified dementia without behavioral disturbance: Secondary | ICD-10-CM

## 2013-09-26 DIAGNOSIS — E039 Hypothyroidism, unspecified: Secondary | ICD-10-CM

## 2013-09-26 DIAGNOSIS — E785 Hyperlipidemia, unspecified: Secondary | ICD-10-CM

## 2013-09-26 DIAGNOSIS — Z7901 Long term (current) use of anticoagulants: Secondary | ICD-10-CM

## 2013-09-26 DIAGNOSIS — R5383 Other fatigue: Secondary | ICD-10-CM

## 2013-09-26 DIAGNOSIS — D473 Essential (hemorrhagic) thrombocythemia: Secondary | ICD-10-CM

## 2013-09-26 NOTE — Progress Notes (Signed)
Patient ID: Tanya Farmer, female   DOB: 1926/06/06, 80 y.o.   MRN: [id number]    [facility]    PCP: Irven Shelling, MD  Code Status: full code  No Known Allergies  Chief Complaint: new admit  HPI:  80 y/o female pt is here for STR after hospital admission from 09/16/13- 09/20/13 with generalized weakness and was noted to have UTI. She also had bibasilar infiltrates on cxr and was started on rocephin. Her urine grew proteus mirabilis and she was then switched to levofloxacin. With her dementia , recent infection and weakness, STR in SNF was recommended. She was seen in her room with her daughter present. She has no concerns this visit and is working with therapy team. Daughter is concerned about preventing another uti and making pt as independent as possible and stronger to be able to take care of herself.   Review of Systems:  Constitutional: Negative for fever, chills, weight loss, malaise/fatigue and diaphoresis.  HENT: Negative for congestion, hearing loss and sore throat.   Eyes: Negative for blurred vision, double vision and discharge.  Respiratory: Negative for cough, sputum production, shortness of breath and wheezing.   Cardiovascular: Negative for chest pain, palpitations, orthopnea and leg swelling.  Gastrointestinal: Negative for heartburn, nausea, vomiting, abdominal pain, diarrhea and constipation.  Genitourinary: Negative for dysuria, urgency, frequency and flank pain.  Musculoskeletal: Negative for back pain, falls, joint pain and myalgias.  Skin: Negative for itching and rash.  Neurological: Positive for weakness. Negative for dizziness, tingling, focal weakness and headaches.  Psychiatric/Behavioral: Negative for depression.The patient is not nervous/anxious.     Past Medical History  Diagnosis Date  . Hypertension   . Hyperlipidemia   . Thyroid disease     Hypothyroidism  . GERD (gastroesophageal reflux disease)   . Arthritis     Degenerative  .  Peripheral vascular disease     Left   Fem-Pop and  tibial Thrombectomy  . High platelet count   . Dementia   . Pain in toe of right foot   . Hypothyroidism    Past Surgical History  Procedure Laterality Date  . Femoral-popliteal bypass graft  2002    Left  Dr. Sherren Mocha Early  . Abdominal hysterectomy    . Cataract surgery      left eye   Social History:   reports that she has quit smoking. She has never used smokeless tobacco. She reports that she does not drink alcohol or use illicit drugs.  No family history on file.  Medications: Patient's Medications  New Prescriptions   No medications on file  Previous Medications   ATORVASTATIN (LIPITOR) 10 MG TABLET    Take 10 mg by mouth daily.   CALCIUM-VITAMIN D (OSCAL WITH D) 500-200 MG-UNIT PER TABLET    Take 1 tablet by mouth 2 (two) times daily.   DONEPEZIL (ARICEPT) 10 MG TABLET    Take 10 mg by mouth daily.    FEEDING SUPPLEMENT, ENSURE COMPLETE, (ENSURE COMPLETE) LIQD    Take 237 mLs by mouth daily.   FISH OIL-OMEGA-3 FATTY ACIDS 1000 MG CAPSULE    Take 1 g by mouth daily.   HYDROXYUREA (HYDREA) 500 MG CAPSULE    Take 1,000 mg by mouth daily. May take with food to minimize GI side effects.   LEVOTHYROXINE (SYNTHROID) 75 MCG TABLET    Take 1 tablet (75 mcg total) by mouth daily before breakfast.   LOSARTAN (COZAAR) 50 MG TABLET    Take  1 tablet (50 mg total) by mouth daily.   MULTIPLE VITAMIN (MULTIVITAMIN WITH MINERALS) TABS TABLET    Take 1 tablet by mouth daily.   PANTOPRAZOLE (PROTONIX) 40 MG TABLET    Take 40 mg by mouth daily.   TRAZODONE (DESYREL) 50 MG TABLET    Take 25 mg by mouth at bedtime as needed for sleep.    WARFARIN (COUMADIN) 5 MG TABLET    Take 6 mg by mouth daily.   Modified Medications   No medications on file  Discontinued Medications   No medications on file     Physical Exam:  Filed Vitals:   09/26/13 1700  BP: 137/90  Pulse: 76  Temp: 98 F (36.7 C)  Resp: 18  SpO2: 95%   General- elderly  female in no acute distress, thin built Head- atraumatic, normocephalic Eyes- PERRLA, EOMI, no pallor, no icterus Neck- no lymphadenopathy, no thyromegaly, no jugular vein distension, no carotid bruit Chest- no chest wall deformities, no chest wall tenderness Cardiovascular- normal s1,s2, no murmurs/ rubs/ gallops. Trace pedal edema Respiratory- bilateral clear to auscultation, no wheeze, no rhonchi, no crackles Abdomen- bowel sounds present, soft, non tender, no organomegaly, no abdominal bruits, no guarding or rigidity, no CVA tenderness Musculoskeletal- able to move all 4 extremities, weakness in her extremities and unsteady gait Neurological- no focal deficit Psychiatry- alert and oriented to person, normal mood and affect   Labs reviewed: Basic Metabolic Panel:  Recent Labs  09/16/13 1440 09/17/13 0732 09/18/13 0710  NA 143 142 139  K 4.0 3.3* 3.7  CL 103 104 105  CO2 26 23 22   GLUCOSE 168* 135* 98  BUN 20 14 16   CREATININE 0.76 0.67 0.78  CALCIUM 9.8 8.9 8.5   Liver Function Tests:  Recent Labs  07/05/13 1415 09/16/13 1440 09/17/13 0732  AST 25 32 27  ALT 19 19 15   ALKPHOS 54 63 55  BILITOT 0.3 0.4 0.5  PROT 7.5 8.6* 7.6  ALBUMIN 3.4* 3.7 3.4*   No results found for this basename: LIPASE, AMYLASE,  in the last 8760 hours No results found for this basename: AMMONIA,  in the last 8760 hours CBC:  Recent Labs  07/06/13 0430 09/16/13 1440 09/17/13 0732 09/18/13 0710  WBC 3.4* 6.1 5.9 5.6  NEUTROABS  --  5.5  --  4.0  HGB 12.5 14.1 13.3 12.8  HCT 36.5 41.6 38.6 37.7  MCV 109.0* 112.1* 109.7* 109.9*  PLT 654* 731* 706* 576*   Cardiac Enzymes:  Recent Labs  07/05/13 2028 07/05/13 2345 07/06/13 0430  CKTOTAL 59 59 50  CKMB 1.5 1.5 1.4  TROPONINI <0.30 <0.30 <0.30   BNP: No components found with this basename: POCBNP,  CBG:  Recent Labs  09/17/13 0800  GLUCAP 131*    Radiological Exams: 08-20-13: mri of brain Moderate motion artifact.   No evidence of acute infarct.  09-16-13: chest x-ray: Stable cardiomegaly and pulmonary venous congestion. Mild infiltrates developing in the lung bases cannot be excluded. This could be secondary to mild pulmonary edema versus pneumonia.   Assessment/Plan  Generalized weakness: with her age, recent infection, dementia all contributing to this with poor nutritional status. encourage po intake and continue nutritional supplements. Fall precautions. To work with therapy team as tolerated. Has completed her antibiotics  Hypertension: is stable on cozaar 50 mg daily. Monitor bp  Dementia: persists, likely mixture of vascular and alzhimer. talked with daughter about the disease course, progression. Continue aricept for now. Assistance with ADLS for  now with fall precautions and skin care  Hyperlipidemia: continue lipitor 10 mg daily and fish oil 1 gm daily   Vitamin d def- continue ca-vit d supplement and have fall precautions  Hypothyroidism: will continue synthroid 75 mcg daily   Gerd: will continue protonix 40 mg daily   UTI: completed antibiotic, encouraged hydration  Essential Thrombocytosis: is stable with her hydroxurea for now. montiro  History of embolism: continue anticoagulation therapy with coumadin, goal inr 2-3.   Family/ staff Communication: reviewed care plan with patient and nursing supervisor   Goals of care: home after STR

## 2013-09-30 NOTE — Progress Notes (Signed)
Patient ID: Tanya Farmer, female   DOB: 1925-10-14, 80 y.o.   MRN: [id number]    [facility]  No Known Allergies   Chief Complaint  Patient presents with  . Acute Visit    coumadin management     HPI:  She is on chronic coumadin therapy for an embolism. Her inr today is 2.1 and she is taking coumadin 7mg  daily. She is unable to fully participate in the hpir or ros. There are no concerns being voiced by the nursing staff at this time.    Past Medical History  Diagnosis Date  . Hypertension   . Hyperlipidemia   . Thyroid disease     Hypothyroidism  . GERD (gastroesophageal reflux disease)   . Arthritis     Degenerative  . Peripheral vascular disease     Left   Fem-Pop and  tibial Thrombectomy  . High platelet count   . Dementia   . Pain in toe of right foot   . Hypothyroidism     Past Surgical History  Procedure Laterality Date  . Femoral-popliteal bypass graft  2002    Left  Dr. Sherren Mocha Early  . Abdominal hysterectomy    . Cataract surgery      left eye    VITAL SIGNS BP 129/88  Pulse 65  Ht 5\' 4"  (1.626 m)  Wt 127 lb (57.607 kg)  BMI 21.79 kg/m2   Patient's Medications  New Prescriptions   No medications on file  Previous Medications   ATORVASTATIN (LIPITOR) 10 MG TABLET    Take 10 mg by mouth daily.   CALCIUM-VITAMIN D (OSCAL WITH D) 500-200 MG-UNIT PER TABLET    Take 1 tablet by mouth 2 (two) times daily.   DONEPEZIL (ARICEPT) 10 MG TABLET    Take 10 mg by mouth daily.    FEEDING SUPPLEMENT, ENSURE COMPLETE, (ENSURE COMPLETE) LIQD    Take 237 mLs by mouth daily.   FISH OIL-OMEGA-3 FATTY ACIDS 1000 MG CAPSULE    Take 1 g by mouth daily.   HYDROXYUREA (HYDREA) 500 MG CAPSULE    Take 1,000 mg by mouth daily. May take with food to minimize GI side effects.   LEVOTHYROXINE (SYNTHROID) 75 MCG TABLET    Take 1 tablet (75 mcg total) by mouth daily before breakfast.   LOSARTAN (COZAAR) 50 MG TABLET    Take 1 tablet (50 mg total) by mouth daily.   MULTIPLE VITAMIN  (MULTIVITAMIN WITH MINERALS) TABS TABLET    Take 1 tablet by mouth daily.   PANTOPRAZOLE (PROTONIX) 40 MG TABLET    Take 40 mg by mouth daily.   TRAZODONE (DESYREL) 50 MG TABLET    Take 25 mg by mouth at bedtime as needed for sleep.    WARFARIN (COUMADIN) 5 MG TABLET    Take 6 mg by mouth daily.   Modified Medications   No medications on file  Discontinued Medications   No medications on file    SIGNIFICANT DIAGNOSTIC EXAMS  08-20-13: mri of brain Moderate motion artifact.  No evidence of acute infarct.  09-16-13: chest x-ray: Stable cardiomegaly and pulmonary venous congestion. Mild infiltrates developing in the lung bases cannot be excluded. This could be secondary to mild pulmonary edema versus pneumonia.    LABS REVIEWED:  07-05-13: tsh 0.032; vit b12: 913; folate >20 09-16-13: wbc 6.1; hgb 14.1; hct 41.6; mcv 112.1; plt 731; glucose 168; bun 20; creat 0.78; k+4.0; na++143; t protein 8.6; albumin 3.7; BNP 619.3 Urine culture: p mirabilis:  09-18-13; wbc 5.6; hgb 12.8; hct 37.7; mcv 109.9; plt 576; glucose 98; bun 16; creat 0.78; k+3.7; na++139     Review of Systems  Unable to perform ROS    Physical Exam  Constitutional: No distress.  thin  Neck: Neck supple. No JVD present.  Cardiovascular: Normal rate and intact distal pulses.   Has an occasional irregular beat   Respiratory: Effort normal and breath sounds normal. No respiratory distress. She has no wheezes.  GI: Soft. Bowel sounds are normal. She exhibits no distension. There is no tenderness.  Musculoskeletal: She exhibits edema.  Is able to move all extremities; has trace lower extremity edema.   Neurological: She is alert.  Skin: Skin is warm and dry. She is not diaphoretic.      ASSESSMENT/ PLAN:  1. For her embolism and chronic anticoagulation management: for her inr of 2.1 will continue coumadin 7 mg daily and will check inr in one week will monitor

## 2013-10-02 ENCOUNTER — Encounter: Payer: Self-pay | Admitting: Adult Health

## 2013-10-02 ENCOUNTER — Non-Acute Institutional Stay (SKILLED_NURSING_FACILITY): Payer: Medicare Other | Admitting: Adult Health

## 2013-10-02 DIAGNOSIS — Z7901 Long term (current) use of anticoagulants: Secondary | ICD-10-CM

## 2013-10-02 DIAGNOSIS — I749 Embolism and thrombosis of unspecified artery: Secondary | ICD-10-CM

## 2013-10-02 NOTE — Progress Notes (Signed)
Patient ID: Tanya Farmer, female   DOB: [birth year], 80 y.o.   MRN: [id number]     [facility]  No Known Allergies   Chief Complaint  Patient presents with  . Acute Visit    coumadin management     HPI:  She is being seen for the management of her chronic coumadin therapy. She is presently taking coumadin 7 mg daily and her inr today is 4.1. There are no signs of bleeding present. There are no concerns being voiced by the nursing staff at this time.   Past Medical History  Diagnosis Date  . Hypertension   . Hyperlipidemia   . Thyroid disease     Hypothyroidism  . GERD (gastroesophageal reflux disease)   . Arthritis     Degenerative  . Peripheral vascular disease     Left   Fem-Pop and  tibial Thrombectomy  . High platelet count   . Dementia   . Pain in toe of right foot   . Hypothyroidism     Past Surgical History  Procedure Laterality Date  . Femoral-popliteal bypass graft  2002    Left  Dr. Sherren Mocha Early  . Abdominal hysterectomy    . Cataract surgery      left eye    VITAL SIGNS BP 137/88  Pulse 70  Ht 5\' 4"  (1.626 m)  Wt 127 lb (57.607 kg)  BMI 21.79 kg/m2   Patient's Medications  New Prescriptions   No medications on file  Previous Medications   ATORVASTATIN (LIPITOR) 10 MG TABLET    Take 10 mg by mouth daily.   CALCIUM-VITAMIN D (OSCAL WITH D) 500-200 MG-UNIT PER TABLET    Take 1 tablet by mouth 2 (two) times daily.   DONEPEZIL (ARICEPT) 10 MG TABLET    Take 10 mg by mouth daily.    FEEDING SUPPLEMENT, ENSURE COMPLETE, (ENSURE COMPLETE) LIQD    Take 237 mLs by mouth daily.   FISH OIL-OMEGA-3 FATTY ACIDS 1000 MG CAPSULE    Take 1 g by mouth daily.   HYDROXYUREA (HYDREA) 500 MG CAPSULE    Take 1,000 mg by mouth daily. May take with food to minimize GI side effects.   LEVOTHYROXINE (SYNTHROID) 75 MCG TABLET    Take 1 tablet (75 mcg total) by mouth daily before breakfast.   LOSARTAN (COZAAR) 50 MG TABLET    Take 1 tablet (50 mg total) by mouth daily.   MULTIPLE VITAMIN (MULTIVITAMIN WITH MINERALS) TABS TABLET    Take 1 tablet by mouth daily.   PANTOPRAZOLE (PROTONIX) 40 MG TABLET    Take 40 mg by mouth daily.   TRAZODONE (DESYREL) 50 MG TABLET    Take 25 mg by mouth at bedtime as needed for sleep.    WARFARIN (COUMADIN) 5 MG TABLET    Take 7 mg by mouth daily.   Modified Medications   No medications on file  Discontinued Medications   No medications on file    SIGNIFICANT DIAGNOSTIC EXAMS  08-20-13: mri of brain Moderate motion artifact.  No evidence of acute infarct.  09-16-13: chest x-ray: Stable cardiomegaly and pulmonary venous congestion. Mild infiltrates developing in the lung bases cannot be excluded. This could be secondary to mild pulmonary edema versus pneumonia.    LABS REVIEWED:  07-05-13: tsh 0.032; vit b12: 913; folate >20 09-16-13: wbc 6.1; hgb 14.1; hct 41.6; mcv 112.1; plt 731; glucose 168; bun 20; creat 0.78; k+4.0; na++143; t protein 8.6; albumin 3.7; BNP 619.3 Urine culture: p mirabilis:  09-18-13; wbc 5.6; hgb 12.8; hct 37.7; mcv 109.9; plt 576; glucose 98; bun 16; creat 0.78; k+3.7; na++139     Review of Systems  Unable to perform ROS    Physical Exam  Constitutional: No distress.  thin  Neck: Neck supple. No JVD present.  Cardiovascular: Normal rate and intact distal pulses.   Has an occasional irregular beat   Respiratory: Effort normal and breath sounds normal. No respiratory distress. She has no wheezes.  GI: Soft. Bowel sounds are normal. She exhibits no distension. There is no tenderness.  Musculoskeletal: She exhibits edema.  Is able to move all extremities; has trace lower extremity edema.   Neurological: She is alert.  Skin: Skin is warm and dry. She is not diaphoretic.    ASSESSMENT/ PLAN:  1. Embolism/anticoagulation management for her inr of 4.1 will hold her coumadin for 2 days and will recheck her inr and will monitor her status.

## 2013-10-04 ENCOUNTER — Non-Acute Institutional Stay (SKILLED_NURSING_FACILITY): Payer: Medicare Other | Admitting: Adult Health

## 2013-10-04 DIAGNOSIS — I749 Embolism and thrombosis of unspecified artery: Secondary | ICD-10-CM

## 2013-10-04 DIAGNOSIS — Z7901 Long term (current) use of anticoagulants: Secondary | ICD-10-CM

## 2013-10-06 NOTE — Progress Notes (Signed)
Patient ID: Tanya Farmer, female   DOB: 1925/12/20, 80 y.o.   MRN: [id number]     [facility]  No Known Allergies   Chief Complaint  Patient presents with  . Acute Visit    coumadin management     HPI:  She is being seen for her coumadin management. She is on long term coumadin for embolism/thrombus.  Her inr today is 2.4 and her coumadin 7 mg dose has been on hold. There are no concerns being voiced at this time.   Past Medical History  Diagnosis Date  . Hypertension   . Hyperlipidemia   . Thyroid disease     Hypothyroidism  . GERD (gastroesophageal reflux disease)   . Arthritis     Degenerative  . Peripheral vascular disease     Left   Fem-Pop and  tibial Thrombectomy  . High platelet count   . Dementia   . Pain in toe of right foot   . Hypothyroidism     Past Surgical History  Procedure Laterality Date  . Femoral-popliteal bypass graft  2002    Left  Dr. Sherren Mocha Early  . Abdominal hysterectomy    . Cataract surgery      left eye    VITAL SIGNS BP 119/70  Pulse 68  Ht 5\' 4"  (1.626 m)  Wt 127 lb (57.607 kg)  BMI 21.79 kg/m2   Patient's Medications  New Prescriptions   No medications on file  Previous Medications   ATORVASTATIN (LIPITOR) 10 MG TABLET    Take 10 mg by mouth daily.   CALCIUM-VITAMIN D (OSCAL WITH D) 500-200 MG-UNIT PER TABLET    Take 1 tablet by mouth 2 (two) times daily.   DONEPEZIL (ARICEPT) 10 MG TABLET    Take 10 mg by mouth daily.    FEEDING SUPPLEMENT, ENSURE COMPLETE, (ENSURE COMPLETE) LIQD    Take 237 mLs by mouth daily.   FISH OIL-OMEGA-3 FATTY ACIDS 1000 MG CAPSULE    Take 1 g by mouth daily.   HYDROXYUREA (HYDREA) 500 MG CAPSULE    Take 1,000 mg by mouth daily. May take with food to minimize GI side effects.   LEVOTHYROXINE (SYNTHROID) 75 MCG TABLET    Take 1 tablet (75 mcg total) by mouth daily before breakfast.   LOSARTAN (COZAAR) 50 MG TABLET    Take 1 tablet (50 mg total) by mouth daily.   MULTIPLE VITAMIN (MULTIVITAMIN WITH  MINERALS) TABS TABLET    Take 1 tablet by mouth daily.   PANTOPRAZOLE (PROTONIX) 40 MG TABLET    Take 40 mg by mouth daily.   TRAZODONE (DESYREL) 50 MG TABLET    Take 25 mg by mouth at bedtime as needed for sleep.    WARFARIN (COUMADIN) 5 MG TABLET    Take 7 mg by mouth daily.   Modified Medications   No medications on file  Discontinued Medications   No medications on file    SIGNIFICANT DIAGNOSTIC EXAMS   08-20-13: mri of brain Moderate motion artifact.  No evidence of acute infarct.  09-16-13: chest x-ray: Stable cardiomegaly and pulmonary venous congestion. Mild infiltrates developing in the lung bases cannot be excluded. This could be secondary to mild pulmonary edema versus pneumonia.    LABS REVIEWED:  07-05-13: tsh 0.032; vit b12: 913; folate >20 09-16-13: wbc 6.1; hgb 14.1; hct 41.6; mcv 112.1; plt 731; glucose 168; bun 20; creat 0.78; k+4.0; na++143; t protein 8.6; albumin 3.7; BNP 619.3 Urine culture: p mirabilis:  09-18-13; wbc  5.6; hgb 12.8; hct 37.7; mcv 109.9; plt 576; glucose 98; bun 16; creat 0.78; k+3.7; na++139     Review of Systems  Unable to perform ROS    Physical Exam  Constitutional: No distress.  thin  Neck: Neck supple. No JVD present.  Cardiovascular: Normal rate and intact distal pulses.   Has an occasional irregular beat   Respiratory: Effort normal and breath sounds normal. No respiratory distress. She has no wheezes.  GI: Soft. Bowel sounds are normal. She exhibits no distension. There is no tenderness.  Musculoskeletal: She exhibits edema.  Is able to move all extremities; has trace lower extremity edema.   Neurological: She is alert.  Skin: Skin is warm and dry. She is not diaphoretic.     ASSESSMENT/ PLAN:  1. Embolism/thrombus/anticoagulation management: for the inr of 2.4 will restart coumadin 7 mg daily and will check inr on Tuesday and will monitor her status.

## 2013-10-09 ENCOUNTER — Non-Acute Institutional Stay (SKILLED_NURSING_FACILITY): Payer: Medicare Other | Admitting: Adult Health

## 2013-10-09 ENCOUNTER — Encounter: Payer: Self-pay | Admitting: Adult Health

## 2013-10-09 DIAGNOSIS — Z7901 Long term (current) use of anticoagulants: Secondary | ICD-10-CM

## 2013-10-09 DIAGNOSIS — I749 Embolism and thrombosis of unspecified artery: Secondary | ICD-10-CM

## 2013-10-09 NOTE — Progress Notes (Signed)
Patient ID: Tanya Farmer, female   DOB: 1926/05/26, 80 y.o.   MRN: [id number]     [facility]  No Known Allergies   Chief Complaint  Patient presents with  . Acute Visit    coumadin management     HPI:  Her inr today is 2.6 and she is taking coumadin 7 mg daily. There are no concerns being voiced by the nursing staff at this time. She is tolerating her coumadin therapy without difficulty.   Past Medical History  Diagnosis Date  . Hypertension   . Hyperlipidemia   . Thyroid disease     Hypothyroidism  . GERD (gastroesophageal reflux disease)   . Arthritis     Degenerative  . Peripheral vascular disease     Left   Fem-Pop and  tibial Thrombectomy  . High platelet count   . Dementia   . Pain in toe of right foot   . Hypothyroidism     Past Surgical History  Procedure Laterality Date  . Femoral-popliteal bypass graft  2002    Left  Dr. Sherren Mocha Early  . Abdominal hysterectomy    . Cataract surgery      left eye    VITAL SIGNS BP 142/73  Pulse 64  Ht 5' (1.524 m)  Wt 128 lb (58.06 kg)  BMI 25.00 kg/m2   Patient's Medications  New Prescriptions   No medications on file  Previous Medications   ATORVASTATIN (LIPITOR) 10 MG TABLET    Take 10 mg by mouth daily.   CALCIUM-VITAMIN D (OSCAL WITH D) 500-200 MG-UNIT PER TABLET    Take 1 tablet by mouth 2 (two) times daily.   DONEPEZIL (ARICEPT) 10 MG TABLET    Take 10 mg by mouth daily.    FEEDING SUPPLEMENT, ENSURE COMPLETE, (ENSURE COMPLETE) LIQD    Take 237 mLs by mouth daily.   FISH OIL-OMEGA-3 FATTY ACIDS 1000 MG CAPSULE    Take 1 g by mouth daily.   HYDROXYUREA (HYDREA) 500 MG CAPSULE    Take 1,000 mg by mouth daily. May take with food to minimize GI side effects.   LEVOTHYROXINE (SYNTHROID) 75 MCG TABLET    Take 1 tablet (75 mcg total) by mouth daily before breakfast.   LOSARTAN (COZAAR) 50 MG TABLET    Take 1 tablet (50 mg total) by mouth daily.   MULTIPLE VITAMIN (MULTIVITAMIN WITH MINERALS) TABS TABLET    Take 1  tablet by mouth daily.   PANTOPRAZOLE (PROTONIX) 40 MG TABLET    Take 40 mg by mouth daily.   TRAZODONE (DESYREL) 50 MG TABLET    Take 25 mg by mouth at bedtime as needed for sleep.    WARFARIN (COUMADIN) 5 MG TABLET    Take 7 mg by mouth daily.   Modified Medications   No medications on file  Discontinued Medications   No medications on file    SIGNIFICANT DIAGNOSTIC EXAMS  08-20-13: mri of brain Moderate motion artifact.  No evidence of acute infarct.  09-16-13: chest x-ray: Stable cardiomegaly and pulmonary venous congestion. Mild infiltrates developing in the lung bases cannot be excluded. This could be secondary to mild pulmonary edema versus pneumonia.    LABS REVIEWED:  07-05-13: tsh 0.032; vit b12: 913; folate >20 09-16-13: wbc 6.1; hgb 14.1; hct 41.6; mcv 112.1; plt 731; glucose 168; bun 20; creat 0.78; k+4.0; na++143; t protein 8.6; albumin 3.7; BNP 619.3 Urine culture: p mirabilis:  09-18-13; wbc 5.6; hgb 12.8; hct 37.7; mcv 109.9; plt 576;  glucose 98; bun 16; creat 0.78; k+3.7; na++139     Review of Systems  Unable to perform ROS    Physical Exam  Constitutional: No distress.  thin  Neck: Neck supple. No JVD present.  Cardiovascular: Normal rate and intact distal pulses.   Has an occasional irregular beat   Respiratory: Effort normal and breath sounds normal. No respiratory distress. She has no wheezes.  GI: Soft. Bowel sounds are normal. She exhibits no distension. There is no tenderness.  Musculoskeletal: She exhibits edema.  Is able to move all extremities; has trace lower extremity edema.   Neurological: She is alert.  Skin: Skin is warm and dry. She is not diaphoretic.     ASSESSMENT/ PLAN:  1. Embolism and thrombus: for inr of 2.6 will continue coumadin 7 mg daily and will check inr in one week.

## 2013-10-14 ENCOUNTER — Non-Acute Institutional Stay (SKILLED_NURSING_FACILITY): Payer: Medicare Other | Admitting: Adult Health

## 2013-10-14 ENCOUNTER — Encounter: Payer: Self-pay | Admitting: Adult Health

## 2013-10-14 DIAGNOSIS — I749 Embolism and thrombosis of unspecified artery: Secondary | ICD-10-CM

## 2013-10-14 DIAGNOSIS — Z7901 Long term (current) use of anticoagulants: Secondary | ICD-10-CM

## 2013-10-14 NOTE — Progress Notes (Signed)
Patient ID: Tanya Farmer, female   DOB: 01-Feb-1926, 80 y.o.   MRN: [id number]    [facility]  No Known Allergies   Chief Complaint  Patient presents with  . Acute Visit    coumadin management     HPI:   Her inr today is 2.7  and she is taking coumadin 7 mg daily. There are no concerns being voiced by the nursing staff at this time. She is tolerating her coumadin therapy without difficulty.    Past Medical History  Diagnosis Date  . Hypertension   . Hyperlipidemia   . Thyroid disease     Hypothyroidism  . GERD (gastroesophageal reflux disease)   . Arthritis     Degenerative  . Peripheral vascular disease     Left   Fem-Pop and  tibial Thrombectomy  . High platelet count   . Dementia   . Pain in toe of right foot   . Hypothyroidism     Past Surgical History  Procedure Laterality Date  . Femoral-popliteal bypass graft  2002    Left  Dr. Sherren Mocha Early  . Abdominal hysterectomy    . Cataract surgery      left eye    VITAL SIGNS BP 134/67  Pulse 75  Ht 4\' 6"  (1.372 m)  Wt 128 lb (58.06 kg)  BMI 30.84 kg/m2   Patient's Medications  New Prescriptions   No medications on file  Previous Medications   ATORVASTATIN (LIPITOR) 10 MG TABLET    Take 10 mg by mouth daily.   CALCIUM-VITAMIN D (OSCAL WITH D) 500-200 MG-UNIT PER TABLET    Take 1 tablet by mouth 2 (two) times daily.   DONEPEZIL (ARICEPT) 10 MG TABLET    Take 10 mg by mouth daily.    FEEDING SUPPLEMENT, ENSURE COMPLETE, (ENSURE COMPLETE) LIQD    Take 237 mLs by mouth daily.   FISH OIL-OMEGA-3 FATTY ACIDS 1000 MG CAPSULE    Take 1 g by mouth daily.   HYDROXYUREA (HYDREA) 500 MG CAPSULE    Take 1,000 mg by mouth daily. May take with food to minimize GI side effects.   LEVOTHYROXINE (SYNTHROID) 75 MCG TABLET    Take 1 tablet (75 mcg total) by mouth daily before breakfast.   LOSARTAN (COZAAR) 50 MG TABLET    Take 1 tablet (50 mg total) by mouth daily.   MULTIPLE VITAMIN (MULTIVITAMIN WITH MINERALS) TABS TABLET     Take 1 tablet by mouth daily.   PANTOPRAZOLE (PROTONIX) 40 MG TABLET    Take 40 mg by mouth daily.   TRAZODONE (DESYREL) 50 MG TABLET    Take 25 mg by mouth at bedtime as needed for sleep.    WARFARIN (COUMADIN) 5 MG TABLET    Take 7 mg by mouth daily.   Modified Medications   No medications on file  Discontinued Medications   No medications on file    SIGNIFICANT DIAGNOSTIC EXAMS   08-20-13: mri of brain Moderate motion artifact.  No evidence of acute infarct.  09-16-13: chest x-ray: Stable cardiomegaly and pulmonary venous congestion. Mild infiltrates developing in the lung bases cannot be excluded. This could be secondary to mild pulmonary edema versus pneumonia.    LABS REVIEWED:  07-05-13: tsh 0.032; vit b12: 913; folate >20 09-16-13: wbc 6.1; hgb 14.1; hct 41.6; mcv 112.1; plt 731; glucose 168; bun 20; creat 0.78; k+4.0; na++143; t protein 8.6; albumin 3.7; BNP 619.3 Urine culture: p mirabilis:  09-18-13; wbc 5.6; hgb 12.8; hct 37.7;  mcv 109.9; plt 576; glucose 98; bun 16; creat 0.78; k+3.7; na++139     Review of Systems  Unable to perform ROS    Physical Exam  Constitutional: No distress.  thin  Neck: Neck supple. No JVD present.  Cardiovascular: Normal rate and intact distal pulses.   Has an occasional irregular beat   Respiratory: Effort normal and breath sounds normal. No respiratory distress. She has no wheezes.  GI: Soft. Bowel sounds are normal. She exhibits no distension. There is no tenderness.  Musculoskeletal: She exhibits edema.  Is able to move all extremities; has trace lower extremity edema.   Neurological: She is alert.  Skin: Skin is warm and dry. She is not diaphoretic.     ASSESSMENT/ PLAN:  1. Embolism and thrombus: for inr of 2.7  will continue coumadin 7 mg daily and will check inr in one week.      [name] NP [facility]  Contact [phone] Monday through Friday 8am- 5pm  After hours call [phone]

## 2013-10-21 ENCOUNTER — Non-Acute Institutional Stay (SKILLED_NURSING_FACILITY): Payer: Medicare Other | Admitting: Adult Health

## 2013-10-21 DIAGNOSIS — Z7901 Long term (current) use of anticoagulants: Secondary | ICD-10-CM

## 2013-10-21 DIAGNOSIS — I749 Embolism and thrombosis of unspecified artery: Secondary | ICD-10-CM

## 2013-10-23 ENCOUNTER — Non-Acute Institutional Stay (SKILLED_NURSING_FACILITY): Payer: Medicare Other | Admitting: Adult Health

## 2013-10-23 DIAGNOSIS — R531 Weakness: Secondary | ICD-10-CM

## 2013-10-23 DIAGNOSIS — I749 Embolism and thrombosis of unspecified artery: Secondary | ICD-10-CM

## 2013-10-23 DIAGNOSIS — R5383 Other fatigue: Secondary | ICD-10-CM

## 2013-10-23 DIAGNOSIS — R5381 Other malaise: Secondary | ICD-10-CM

## 2013-10-23 DIAGNOSIS — F0390 Unspecified dementia without behavioral disturbance: Secondary | ICD-10-CM

## 2013-10-23 DIAGNOSIS — R627 Adult failure to thrive: Secondary | ICD-10-CM

## 2013-10-24 ENCOUNTER — Encounter: Payer: Self-pay | Admitting: Adult Health

## 2013-10-24 NOTE — Progress Notes (Signed)
Patient ID: Tanya Farmer, female   DOB: Jul 20, 1926, 80 y.o.   MRN: [id number]     [facility]  No Known Allergies   Chief Complaint  Patient presents with  . Acute Visit    coumadin management     HPI:  She is on long term coumadin therapy for her embolism and thrombus. There are no concerns being voiced by the nursing staff. Her inr today is 5.4 and takes coumadin 7 mg daily. There have been no changes in her recent diet or medications.    Past Medical History  Diagnosis Date  . Hypertension   . Hyperlipidemia   . Thyroid disease     Hypothyroidism  . GERD (gastroesophageal reflux disease)   . Arthritis     Degenerative  . Peripheral vascular disease     Left   Fem-Pop and  tibial Thrombectomy  . High platelet count   . Dementia   . Pain in toe of right foot   . Hypothyroidism     Past Surgical History  Procedure Laterality Date  . Femoral-popliteal bypass graft  2002    Left  Dr. Sherren Mocha Early  . Abdominal hysterectomy    . Cataract surgery      left eye    VITAL SIGNS BP 145/70  Pulse 80  Ht 5\' 4"  (1.626 m)  Wt 128 lb 3.2 oz (58.151 kg)  BMI 21.99 kg/m2   Patient's Medications  New Prescriptions   No medications on file  Previous Medications   ATORVASTATIN (LIPITOR) 10 MG TABLET    Take 10 mg by mouth daily.   CALCIUM-VITAMIN D (OSCAL WITH D) 500-200 MG-UNIT PER TABLET    Take 1 tablet by mouth 2 (two) times daily.   DONEPEZIL (ARICEPT) 10 MG TABLET    Take 10 mg by mouth daily.    FEEDING SUPPLEMENT, ENSURE COMPLETE, (ENSURE COMPLETE) LIQD    Take 237 mLs by mouth daily.   FISH OIL-OMEGA-3 FATTY ACIDS 1000 MG CAPSULE    Take 1 g by mouth daily.   HYDROXYUREA (HYDREA) 500 MG CAPSULE    Take 1,000 mg by mouth daily. May take with food to minimize GI side effects.   LEVOTHYROXINE (SYNTHROID) 75 MCG TABLET    Take 1 tablet (75 mcg total) by mouth daily before breakfast.   LOSARTAN (COZAAR) 50 MG TABLET    Take 1 tablet (50 mg total) by mouth daily.   MULTIPLE VITAMIN (MULTIVITAMIN WITH MINERALS) TABS TABLET    Take 1 tablet by mouth daily.   PANTOPRAZOLE (PROTONIX) 40 MG TABLET    Take 40 mg by mouth daily.   TRAZODONE (DESYREL) 50 MG TABLET    Take 25 mg by mouth at bedtime as needed for sleep.    WARFARIN (COUMADIN) 5 MG TABLET    Take 7 mg by mouth daily.   Modified Medications   No medications on file  Discontinued Medications   No medications on file    SIGNIFICANT DIAGNOSTIC EXAMS  08-20-13: mri of brain Moderate motion artifact.  No evidence of acute infarct.  09-16-13: chest x-ray: Stable cardiomegaly and pulmonary venous congestion. Mild infiltrates developing in the lung bases cannot be excluded. This could be secondary to mild pulmonary edema versus pneumonia.    LABS REVIEWED:  07-05-13: tsh 0.032; vit b12: 913; folate >20 09-16-13: wbc 6.1; hgb 14.1; hct 41.6; mcv 112.1; plt 731; glucose 168; bun 20; creat 0.78; k+4.0; na++143; t protein 8.6; albumin 3.7; BNP 619.3 Urine culture: p  mirabilis:  09-18-13; wbc 5.6; hgb 12.8; hct 37.7; mcv 109.9; plt 576; glucose 98; bun 16; creat 0.78; k+3.7; na++139     Review of Systems  Unable to perform ROS    Physical Exam  Constitutional: No distress.  thin  Neck: Neck supple. No JVD present.  Cardiovascular: Normal rate and intact distal pulses.   Has an occasional irregular beat   Respiratory: Effort normal and breath sounds normal. No respiratory distress. She has no wheezes.  GI: Soft. Bowel sounds are normal. She exhibits no distension. There is no tenderness.  Musculoskeletal: She exhibits edema.  Is able to move all extremities; has trace lower extremity edema.   Neurological: She is alert.  Skin: Skin is warm and dry. She is not diaphoretic.     ASSESSMENT/ PLAN:  1. Embolism and thrombus: for inr of 5.4  will hold  coumadin 7 mg for 2 days and will recheck inr. Will monitor her status.       [name] NP [facility]  Contact [phone]  Monday through Friday 8am- 5pm  After hours call [phone]

## 2013-10-27 ENCOUNTER — Encounter: Payer: Self-pay | Admitting: Adult Health

## 2013-10-27 NOTE — Progress Notes (Signed)
Patient ID: Tanya Farmer, female   DOB: Sep 30, 1925, 80 y.o.   MRN: [id number]     [facility]  No Known Allergies   Chief Complaint  Patient presents with  . Acute Visit    family meeting     HPI:  We have had a care plan meeting regarding her overall status. She is not improving in therapy. She is not capable of following more than one step commands at a time. She is able to perform ADL's with cueing. She is more appropriate for assisted living or memory care. We had a prolonged discussed  With her daughter regarding her goals for her mother. We discussed her mother's decline over the past several months.  Her inr today is 4.7 her coumadin dose of 7 mg has been on hold; this will need to be held for 2 more days and rechecked.   Past Medical History  Diagnosis Date  . Hypertension   . Hyperlipidemia   . Thyroid disease     Hypothyroidism  . GERD (gastroesophageal reflux disease)   . Arthritis     Degenerative  . Peripheral vascular disease     Left   Fem-Pop and  tibial Thrombectomy  . High platelet count   . Dementia   . Pain in toe of right foot   . Hypothyroidism     Past Surgical History  Procedure Laterality Date  . Femoral-popliteal bypass graft  2002    Left  Dr. Sherren Mocha Early  . Abdominal hysterectomy    . Cataract surgery      left eye    VITAL SIGNS BP 145/82  Pulse 80  Ht 5' (1.524 m)  Wt 128 lb 3.2 oz (58.151 kg)  BMI 25.04 kg/m2   Patient's Medications  New Prescriptions   No medications on file  Previous Medications   ATORVASTATIN (LIPITOR) 10 MG TABLET    Take 10 mg by mouth daily.   CALCIUM-VITAMIN D (OSCAL WITH D) 500-200 MG-UNIT PER TABLET    Take 1 tablet by mouth 2 (two) times daily.   DONEPEZIL (ARICEPT) 10 MG TABLET    Take 10 mg by mouth daily.    FEEDING SUPPLEMENT, ENSURE COMPLETE, (ENSURE COMPLETE) LIQD    Take 237 mLs by mouth daily.   FISH OIL-OMEGA-3 FATTY ACIDS 1000 MG CAPSULE    Take 1 g by mouth daily.   HYDROXYUREA (HYDREA) 500  MG CAPSULE    Take 1,000 mg by mouth daily. May take with food to minimize GI side effects.   LEVOTHYROXINE (SYNTHROID) 75 MCG TABLET    Take 1 tablet (75 mcg total) by mouth daily before breakfast.   LOSARTAN (COZAAR) 50 MG TABLET    Take 1 tablet (50 mg total) by mouth daily.   MULTIPLE VITAMIN (MULTIVITAMIN WITH MINERALS) TABS TABLET    Take 1 tablet by mouth daily.   PANTOPRAZOLE (PROTONIX) 40 MG TABLET    Take 40 mg by mouth daily.   TRAZODONE (DESYREL) 50 MG TABLET    Take 25 mg by mouth at bedtime as needed for sleep.    WARFARIN (COUMADIN) 5 MG TABLET    Take 7 mg by mouth daily.   Modified Medications   No medications on file  Discontinued Medications   No medications on file    SIGNIFICANT DIAGNOSTIC EXAMS   08-20-13: mri of brain Moderate motion artifact.  No evidence of acute infarct.  09-16-13: chest x-ray: Stable cardiomegaly and pulmonary venous congestion. Mild infiltrates developing in the  lung bases cannot be excluded. This could be secondary to mild pulmonary edema versus pneumonia.    LABS REVIEWED:  07-05-13: tsh 0.032; vit b12: 913; folate >20 09-16-13: wbc 6.1; hgb 14.1; hct 41.6; mcv 112.1; plt 731; glucose 168; bun 20; creat 0.78; k+4.0; na++143; t protein 8.6; albumin 3.7; BNP 619.3 Urine culture: p mirabilis:  09-18-13; wbc 5.6; hgb 12.8; hct 37.7; mcv 109.9; plt 576; glucose 98; bun 16; creat 0.78; k+3.7; na++139     Review of Systems  Unable to perform ROS    Physical Exam  Constitutional: No distress.  thin  Neck: Neck supple. No JVD present.  Cardiovascular: Normal rate and intact distal pulses.   Has an occasional irregular beat   Respiratory: Effort normal and breath sounds normal. No respiratory distress. She has no wheezes.  GI: Soft. Bowel sounds are normal. She exhibits no distension. There is no tenderness.  Musculoskeletal: She exhibits edema.  Is able to move all extremities; has trace lower extremity edema.   Neurological: She is  alert.  Skin: Skin is warm and dry. She is not diaphoretic.     ASSESSMENT/ PLAN:  1. Embolism and thrombus: for inr of 4.7  will hold  coumadin 7 mg for 2 days and will recheck inr. Will monitor her status.   2. Generalized weakness; dementia; ftt: at this time; will continue her current plan of care. Her family is going to explore care options including assisted living; memory care units and will continue leaving her in skilled nursing long term.   Time spent with patient 1 hour.     [name] NP [facility]  Contact [phone] Monday through Friday 8am- 5pm  After hours call [phone]

## 2013-10-28 ENCOUNTER — Non-Acute Institutional Stay (SKILLED_NURSING_FACILITY): Payer: Medicare Other | Admitting: Adult Health

## 2013-10-28 DIAGNOSIS — I749 Embolism and thrombosis of unspecified artery: Secondary | ICD-10-CM

## 2013-10-28 DIAGNOSIS — Z7901 Long term (current) use of anticoagulants: Secondary | ICD-10-CM

## 2013-10-28 LAB — BASIC METABOLIC PANEL
BUN: 24 mg/dL — AB (ref 4–21)
Creatinine: 1 mg/dL (ref 0.5–1.1)
Glucose: 80 mg/dL
Potassium: 4.1 mmol/L (ref 3.4–5.3)
SODIUM: 142 mmol/L (ref 137–147)

## 2013-10-28 LAB — CBC AND DIFFERENTIAL
HCT: 34 % — AB (ref 36–46)
HEMOGLOBIN: 10.8 g/dL — AB (ref 12.0–16.0)
PLATELETS: 162 10*3/uL (ref 150–399)
WBC: 4.1 10^3/mL

## 2013-11-03 ENCOUNTER — Encounter: Payer: Self-pay | Admitting: Adult Health

## 2013-11-03 NOTE — Progress Notes (Signed)
Patient ID: Tanya Farmer, female   DOB: Apr 09, 1926, 80 y.o.   MRN: [id number]     [facility]  No Known Allergies   Chief Complaint  Patient presents with  . Acute Visit    coumadin management    HPI:  She is on long term coumadin therapy for embolism. There are no concerns being voiced by the nursing staff at this time. She is tolerating her coumadin therapy. Her inr today is 1.3 and her coumadin dose is 6 mg daily   Past Medical History  Diagnosis Date  . Hypertension   . Hyperlipidemia   . Thyroid disease     Hypothyroidism  . GERD (gastroesophageal reflux disease)   . Arthritis     Degenerative  . Peripheral vascular disease     Left   Fem-Pop and  tibial Thrombectomy  . High platelet count   . Dementia   . Pain in toe of right foot   . Hypothyroidism     Past Surgical History  Procedure Laterality Date  . Femoral-popliteal bypass graft  2002    Left  Dr. Sherren Mocha Early  . Abdominal hysterectomy    . Cataract surgery      left eye    VITAL SIGNS BP 119/78  Pulse 80  Ht 5' (1.524 m)  Wt 128 lb 3.2 oz (58.151 kg)  BMI 25.04 kg/m2   Patient's Medications  New Prescriptions   No medications on file  Previous Medications   ATORVASTATIN (LIPITOR) 10 MG TABLET    Take 10 mg by mouth daily.   CALCIUM-VITAMIN D (OSCAL WITH D) 500-200 MG-UNIT PER TABLET    Take 1 tablet by mouth 2 (two) times daily.   DONEPEZIL (ARICEPT) 10 MG TABLET    Take 10 mg by mouth daily.    FEEDING SUPPLEMENT, ENSURE COMPLETE, (ENSURE COMPLETE) LIQD    Take 237 mLs by mouth daily.   FISH OIL-OMEGA-3 FATTY ACIDS 1000 MG CAPSULE    Take 1 g by mouth daily.   HYDROXYUREA (HYDREA) 500 MG CAPSULE    Take 1,000 mg by mouth daily. May take with food to minimize GI side effects.   LEVOTHYROXINE (SYNTHROID) 75 MCG TABLET    Take 1 tablet (75 mcg total) by mouth daily before breakfast.   LOSARTAN (COZAAR) 50 MG TABLET    Take 1 tablet (50 mg total) by mouth daily.   MULTIPLE VITAMIN (MULTIVITAMIN  WITH MINERALS) TABS TABLET    Take 1 tablet by mouth daily.   PANTOPRAZOLE (PROTONIX) 40 MG TABLET    Take 40 mg by mouth daily.   TRAZODONE (DESYREL) 50 MG TABLET    Take 25 mg by mouth at bedtime as needed for sleep.    WARFARIN (COUMADIN) 5 MG TABLET    Take 7 mg by mouth daily.   Modified Medications   No medications on file  Discontinued Medications   No medications on file    SIGNIFICANT DIAGNOSTIC EXAMS  08-20-13: mri of brain Moderate motion artifact.  No evidence of acute infarct.  09-16-13: chest x-ray: Stable cardiomegaly and pulmonary venous congestion. Mild infiltrates developing in the lung bases cannot be excluded. This could be secondary to mild pulmonary edema versus pneumonia.    LABS REVIEWED:  07-05-13: tsh 0.032; vit b12: 913; folate >20 09-16-13: wbc 6.1; hgb 14.1; hct 41.6; mcv 112.1; plt 731; glucose 168; bun 20; creat 0.78; k+4.0; na++143; t protein 8.6; albumin 3.7; BNP 619.3 Urine culture: p mirabilis:  09-18-13; wbc 5.6;  hgb 12.8; hct 37.7; mcv 109.9; plt 576; glucose 98; bun 16; creat 0.78; k+3.7; na++139     Review of Systems  Unable to perform ROS    Physical Exam  Constitutional: No distress.  thin  Neck: Neck supple. No JVD present.  Cardiovascular: Normal rate and intact distal pulses.   Has an occasional irregular beat   Respiratory: Effort normal and breath sounds normal. No respiratory distress. She has no wheezes.  GI: Soft. Bowel sounds are normal. She exhibits no distension. There is no tenderness.  Musculoskeletal: She exhibits edema.  Is able to move all extremities; has trace lower extremity edema.   Neurological: She is alert.  Skin: Skin is warm and dry. She is not diaphoretic.       ASSESSMENT/ PLAN:  1. embolism and anticoagulation management: for her inr of 1.3 will begin coumadin 7 mg daily for 3 days will then recheck inr and will monitor her status.      [name] NP [facility]  Contact  [phone] Monday through Friday 8am- 5pm  After hours call [phone]

## 2013-11-11 ENCOUNTER — Non-Acute Institutional Stay (SKILLED_NURSING_FACILITY): Payer: Medicare Other | Admitting: Adult Health

## 2013-11-11 ENCOUNTER — Encounter: Payer: Self-pay | Admitting: Adult Health

## 2013-11-11 DIAGNOSIS — Z7901 Long term (current) use of anticoagulants: Secondary | ICD-10-CM

## 2013-11-11 DIAGNOSIS — I749 Embolism and thrombosis of unspecified artery: Secondary | ICD-10-CM

## 2013-11-11 NOTE — Progress Notes (Signed)
Patient ID: Tanya Farmer, female   DOB: 12/08/1925, 80 y.o.   MRN: [id number]     [facility]  No Known Allergies   Chief Complaint  Patient presents with  . Acute Visit    coumadin management     HPI:  She is on long term coumadin therapy for embolism/thrombosis. There are no concerns being voiced by the nursing staff at this time. Her inr today is 3.7 and she is taking coumadin 7 mg daily .   Past Medical History  Diagnosis Date  . Hypertension   . Hyperlipidemia   . Thyroid disease     Hypothyroidism  . GERD (gastroesophageal reflux disease)   . Arthritis     Degenerative  . Peripheral vascular disease     Left   Fem-Pop and  tibial Thrombectomy  . High platelet count   . Dementia   . Pain in toe of right foot   . Hypothyroidism     Past Surgical History  Procedure Laterality Date  . Femoral-popliteal bypass graft  2002    Left  Dr. Sherren Mocha Early  . Abdominal hysterectomy    . Cataract surgery      left eye    VITAL SIGNS BP 146/90  Pulse 76  Ht 5' (1.524 m)  Wt 133 lb 6.4 oz (60.51 kg)  BMI 26.05 kg/m2   Patient's Medications  New Prescriptions   No medications on file  Previous Medications   ATORVASTATIN (LIPITOR) 10 MG TABLET    Take 10 mg by mouth daily.   CALCIUM-VITAMIN D (OSCAL WITH D) 500-200 MG-UNIT PER TABLET    Take 1 tablet by mouth 2 (two) times daily.   DONEPEZIL (ARICEPT) 10 MG TABLET    Take 10 mg by mouth daily.    FEEDING SUPPLEMENT, ENSURE COMPLETE, (ENSURE COMPLETE) LIQD    Take 237 mLs by mouth daily.   FISH OIL-OMEGA-3 FATTY ACIDS 1000 MG CAPSULE    Take 1 g by mouth daily.   HYDROXYUREA (HYDREA) 500 MG CAPSULE    Take 1,000 mg by mouth daily. May take with food to minimize GI side effects.   LEVOTHYROXINE (SYNTHROID) 75 MCG TABLET    Take 1 tablet (75 mcg total) by mouth daily before breakfast.   LOSARTAN (COZAAR) 50 MG TABLET    Take 1 tablet (50 mg total) by mouth daily.   MULTIPLE VITAMIN (MULTIVITAMIN WITH MINERALS) TABS TABLET     Take 1 tablet by mouth daily.   PANTOPRAZOLE (PROTONIX) 40 MG TABLET    Take 40 mg by mouth daily.   TRAZODONE (DESYREL) 50 MG TABLET    Take 25 mg by mouth at bedtime as needed for sleep.    WARFARIN (COUMADIN) 5 MG TABLET    Take 7 mg by mouth daily.   Modified Medications   No medications on file  Discontinued Medications   No medications on file    SIGNIFICANT DIAGNOSTIC EXAMS  08-20-13: mri of brain Moderate motion artifact.  No evidence of acute infarct.  09-16-13: chest x-ray: Stable cardiomegaly and pulmonary venous congestion. Mild infiltrates developing in the lung bases cannot be excluded. This could be secondary to mild pulmonary edema versus pneumonia.    LABS REVIEWED:  07-05-13: tsh 0.032; vit b12: 913; folate >20 09-16-13: wbc 6.1; hgb 14.1; hct 41.6; mcv 112.1; plt 731; glucose 168; bun 20; creat 0.78; k+4.0; na++143; t protein 8.6; albumin 3.7; BNP 619.3 Urine culture: p mirabilis:  09-18-13; wbc 5.6; hgb 12.8; hct 37.7;  mcv 109.9; plt 576; glucose 98; bun 16; creat 0.78; k+3.7; na++139     Review of Systems  Unable to perform ROS    Physical Exam  Constitutional: No distress.  thin  Neck: Neck supple. No JVD present.  Cardiovascular: Normal rate and intact distal pulses.   Has an occasional irregular beat   Respiratory: Effort normal and breath sounds normal. No respiratory distress. She has no wheezes.  GI: Soft. Bowel sounds are normal. She exhibits no distension. There is no tenderness.  Musculoskeletal: She exhibits edema.  Is able to move all extremities; has trace lower extremity edema.   Neurological: She is alert.  Skin: Skin is warm and dry. She is not diaphoretic.      ASSESSMENT/ PLAN:  1. Embolism/anticoagluation management: she is presently stable; for her inr of 3.7 will hold her 7 mg coumadin for 2 days and will then recheck her inr will monitor her status.      [name] NP [facility]  Contact [phone]  Monday through Friday 8am- 5pm  After hours call [phone]

## 2013-11-16 ENCOUNTER — Other Ambulatory Visit: Payer: Self-pay

## 2013-11-16 LAB — URINALYSIS, COMPLETE
Bilirubin,UR: NEGATIVE
Blood: NEGATIVE
Glucose,UR: NEGATIVE mg/dL (ref 0–75)
KETONE: NEGATIVE
Leukocyte Esterase: NEGATIVE
Nitrite: NEGATIVE
Ph: 5 (ref 4.5–8.0)
Protein: NEGATIVE
RBC,UR: NONE SEEN /HPF (ref 0–5)
SPECIFIC GRAVITY: 1.026 (ref 1.003–1.030)
SQUAMOUS EPITHELIAL: NONE SEEN
WBC UR: NONE SEEN /HPF (ref 0–5)

## 2013-11-18 ENCOUNTER — Non-Acute Institutional Stay (SKILLED_NURSING_FACILITY): Payer: Medicare Other | Admitting: Adult Health

## 2013-11-18 DIAGNOSIS — I10 Essential (primary) hypertension: Secondary | ICD-10-CM

## 2013-11-18 DIAGNOSIS — F0390 Unspecified dementia without behavioral disturbance: Secondary | ICD-10-CM

## 2013-11-18 DIAGNOSIS — Z7901 Long term (current) use of anticoagulants: Secondary | ICD-10-CM

## 2013-11-18 DIAGNOSIS — K219 Gastro-esophageal reflux disease without esophagitis: Secondary | ICD-10-CM

## 2013-11-18 DIAGNOSIS — E785 Hyperlipidemia, unspecified: Secondary | ICD-10-CM

## 2013-11-18 DIAGNOSIS — D473 Essential (hemorrhagic) thrombocythemia: Secondary | ICD-10-CM

## 2013-11-18 DIAGNOSIS — I749 Embolism and thrombosis of unspecified artery: Secondary | ICD-10-CM

## 2013-11-18 DIAGNOSIS — E039 Hypothyroidism, unspecified: Secondary | ICD-10-CM

## 2013-11-20 LAB — URINE CULTURE

## 2013-11-23 ENCOUNTER — Encounter: Payer: Self-pay | Admitting: Adult Health

## 2013-11-23 NOTE — Progress Notes (Signed)
Patient ID: Tanya Farmer, female   DOB: 1926/04/14, 80 y.o.   MRN: [id number]     [facility]  No Known Allergies   Chief Complaint  Patient presents with  . Medical Managment of Chronic Issues    HPI:  She is being seen for the management of her chronic illnesses.  There are no concerns being voiced by the nursing staff at this time. Overall her status is without significant change. She cannot fully participate in the hpi or the ros. Her inr today is 1.8 and she is taking coumadin 7 mg daily.   Past Medical History  Diagnosis Date  . Hypertension   . Hyperlipidemia   . Thyroid disease     Hypothyroidism  . GERD (gastroesophageal reflux disease)   . Arthritis     Degenerative  . Peripheral vascular disease     Left   Fem-Pop and  tibial Thrombectomy  . High platelet count   . Dementia   . Pain in toe of right foot   . Hypothyroidism     Past Surgical History  Procedure Laterality Date  . Femoral-popliteal bypass graft  2002    Left  Dr. Sherren Mocha Early  . Abdominal hysterectomy    . Cataract surgery      left eye    VITAL SIGNS BP 124/85  Pulse 64  Ht 5' (1.524 m)  Wt 150 lb 4.8 oz (68.176 kg)  BMI 29.35 kg/m2   Patient's Medications  New Prescriptions   No medications on file  Previous Medications   ATORVASTATIN (LIPITOR) 10 MG TABLET    Take 10 mg by mouth daily.   CALCIUM-VITAMIN D (OSCAL WITH D) 500-200 MG-UNIT PER TABLET    Take 1 tablet by mouth 2 (two) times daily.   DONEPEZIL (ARICEPT) 10 MG TABLET    Take 10 mg by mouth daily.    FEEDING SUPPLEMENT, ENSURE COMPLETE, (ENSURE COMPLETE) LIQD    Take 237 mLs by mouth daily.   FISH OIL-OMEGA-3 FATTY ACIDS 1000 MG CAPSULE    Take 1 g by mouth daily.   HYDROXYUREA (HYDREA) 500 MG CAPSULE    Take 1,000 mg by mouth daily. May take with food to minimize GI side effects.   LEVOTHYROXINE (SYNTHROID) 75 MCG TABLET    Take 1 tablet (75 mcg total) by mouth daily before breakfast.   LOSARTAN (COZAAR) 50 MG TABLET     Take 1 tablet (50 mg total) by mouth daily.   MULTIPLE VITAMIN (MULTIVITAMIN WITH MINERALS) TABS TABLET    Take 1 tablet by mouth daily.   PANTOPRAZOLE (PROTONIX) 40 MG TABLET    Take 40 mg by mouth daily.   TRAZODONE (DESYREL) 50 MG TABLET    Take 25 mg by mouth at bedtime as needed for sleep.    WARFARIN (COUMADIN) 5 MG TABLET    Take 7 mg by mouth daily.   Modified Medications   No medications on file  Discontinued Medications   No medications on file    SIGNIFICANT DIAGNOSTIC EXAMS  08-20-13: mri of brain Moderate motion artifact.  No evidence of acute infarct.  09-16-13: chest x-ray: Stable cardiomegaly and pulmonary venous congestion. Mild infiltrates developing in the lung bases cannot be excluded. This could be secondary to mild pulmonary edema versus pneumonia.    LABS REVIEWED:  07-05-13: tsh 0.032; vit b12: 913; folate >20 09-16-13: wbc 6.1; hgb 14.1; hct 41.6; mcv 112.1; plt 731; glucose 168; bun 20; creat 0.78; k+4.0; na++143; t protein 8.6;  albumin 3.7; BNP 619.3 Urine culture: p mirabilis:  09-18-13; wbc 5.6; hgb 12.8; hct 37.7; mcv 109.9; plt 576; glucose 98; bun 16; creat 0.78; k+3.7; na++139  10-28-13: wbc 4.1; hgb 10.8; hct 33.8; mcv 104; plt 162; glucose 80; bun 24; creat 1.0; k+4.1; na++142; liver normal albumin 3.5     Review of Systems  Unable to perform ROS    Physical Exam  Constitutional: No distress.  thin  Neck: Neck supple. No JVD present.  Cardiovascular: Normal rate and intact distal pulses.   Has an occasional irregular beat   Respiratory: Effort normal and breath sounds normal. No respiratory distress. She has no wheezes.  GI: Soft. Bowel sounds are normal. She exhibits no distension. There is no tenderness.  Musculoskeletal: She exhibits edema.  Is able to move all extremities; has trace lower extremity edema.   Neurological: She is alert.  Skin: Skin is warm and dry. She is not diaphoretic.       ASSESSMENT/ PLAN:  1.  Embolism/anticoagulation management: for her inr of 1.8 will continue her coumadin at 7 mg daily and will check in one week and will monitor her status   2. Essential thrombocytosis: she is stable will continue hydrea 1 gm daily; will monitor  3. Hypothyroidism; is stable will continue synthroid 75 mcg daily   4. Hypertension: cozaar 50 mg daily and will monitor her   5. Dyslipidemia: will continue lipitor 10 mg daily and fish oil 1 gm daily  6. Gerd: will continue protonix 40 mg daily   7. Insomnia: is stable will continue trazodone 25 mg nightly  8. Dementia: will continue her aricept 10 mg daily and will monitor her status.        [name] NP [facility]  Contact [phone] Monday through Friday 8am- 5pm  After hours call [phone]

## 2013-12-18 ENCOUNTER — Non-Acute Institutional Stay (SKILLED_NURSING_FACILITY): Payer: Medicare Other | Admitting: Adult Health

## 2013-12-18 ENCOUNTER — Encounter: Payer: Self-pay | Admitting: Adult Health

## 2013-12-18 DIAGNOSIS — K5900 Constipation, unspecified: Secondary | ICD-10-CM

## 2013-12-18 DIAGNOSIS — I749 Embolism and thrombosis of unspecified artery: Secondary | ICD-10-CM

## 2013-12-18 DIAGNOSIS — F0390 Unspecified dementia without behavioral disturbance: Secondary | ICD-10-CM

## 2013-12-18 DIAGNOSIS — K219 Gastro-esophageal reflux disease without esophagitis: Secondary | ICD-10-CM

## 2013-12-18 DIAGNOSIS — D473 Essential (hemorrhagic) thrombocythemia: Secondary | ICD-10-CM

## 2013-12-18 DIAGNOSIS — E039 Hypothyroidism, unspecified: Secondary | ICD-10-CM

## 2013-12-18 DIAGNOSIS — I10 Essential (primary) hypertension: Secondary | ICD-10-CM

## 2013-12-18 DIAGNOSIS — G4700 Insomnia, unspecified: Secondary | ICD-10-CM

## 2013-12-18 MED ORDER — LOSARTAN POTASSIUM 50 MG PO TABS: 75.0000 mg | ORAL_TABLET | Freq: Every day | ORAL | Status: DC

## 2013-12-18 NOTE — Progress Notes (Signed)
Patient ID: Tanya Farmer, female   DOB: Feb 12, 1926, 80 y.o.   MRN: [id number]     [facility]  No Known Allergies   Chief Complaint  Patient presents with  . Medical Management of Chronic Issues    HPI:  She is being seen for the management of her chronic illnesses. Overall there is little change in her status. There are no concerns being voiced by the nursing staff at this time. Her blood pressure readings have been more elevated and will require treatment. She is unable to fully participate in the hpi or ros.    Past Medical History  Diagnosis Date  . Hypertension   . Hyperlipidemia   . Thyroid disease     Hypothyroidism  . GERD (gastroesophageal reflux disease)   . Arthritis     Degenerative  . Peripheral vascular disease     Left   Fem-Pop and  tibial Thrombectomy  . High platelet count   . Dementia   . Pain in toe of right foot   . Hypothyroidism     Past Surgical History  Procedure Laterality Date  . Femoral-popliteal bypass graft  2002    Left  Dr. Sherren Mocha Early  . Abdominal hysterectomy    . Cataract surgery      left eye    VITAL SIGNS BP 137/97  Pulse 67  Ht 5' (1.524 m)  Wt 137 lb 12.8 oz (62.506 kg)  BMI 26.91 kg/m2   Patient's Medications  New Prescriptions   No medications on file  Previous Medications   ATORVASTATIN (LIPITOR) 10 MG TABLET    Take 10 mg by mouth daily.   CALCIUM-VITAMIN D (OSCAL WITH D) 500-200 MG-UNIT PER TABLET    Take 1 tablet by mouth 2 (two) times daily.   DONEPEZIL (ARICEPT) 10 MG TABLET    Take 10 mg by mouth daily.    FEEDING SUPPLEMENT, ENSURE COMPLETE, (ENSURE COMPLETE) LIQD    Take 237 mLs by mouth daily.   HYDROXYUREA (HYDREA) 500 MG CAPSULE    Take 1,000 mg by mouth daily. May take with food to minimize GI side effects.   LEVOTHYROXINE (SYNTHROID) 75 MCG TABLET    Take 1 tablet (75 mcg total) by mouth daily before breakfast.   LOSARTAN (COZAAR) 50 MG TABLET    Take 1 tablet (50 mg total) by mouth daily.   MULTIPLE  VITAMIN (MULTIVITAMIN WITH MINERALS) TABS TABLET    Take 1 tablet by mouth daily.   PANTOPRAZOLE (PROTONIX) 40 MG TABLET    Take 40 mg by mouth daily.   SENNOSIDES-DOCUSATE SODIUM (SENOKOT-S) 8.6-50 MG TABLET    Take 1 tablet by mouth 2 (two) times daily.   TRAZODONE (DESYREL) 50 MG TABLET    Take 25 mg by mouth at bedtime as needed for sleep.    WARFARIN (COUMADIN) 5 MG TABLET    Take 7 mg by mouth daily.   Modified Medications   No medications on file  Discontinued Medications   FISH OIL-OMEGA-3 FATTY ACIDS 1000 MG CAPSULE    Take 1 g by mouth daily.    SIGNIFICANT DIAGNOSTIC EXAMS   08-20-13: mri of brain Moderate motion artifact.  No evidence of acute infarct.  09-16-13: chest x-ray: Stable cardiomegaly and pulmonary venous congestion. Mild infiltrates developing in the lung bases cannot be excluded. This could be secondary to mild pulmonary edema versus pneumonia.    LABS REVIEWED:  07-05-13: tsh 0.032; vit b12: 913; folate >20 09-16-13: wbc 6.1; hgb 14.1; hct  41.6; mcv 112.1; plt 731; glucose 168; bun 20; creat 0.78; k+4.0; na++143; t protein 8.6; albumin 3.7; BNP 619.3 Urine culture: p mirabilis:  09-18-13; wbc 5.6; hgb 12.8; hct 37.7; mcv 109.9; plt 576; glucose 98; bun 16; creat 0.78; k+3.7; na++139  10-28-13: wbc 4.1; hgb 10.8; hct 33.8; mcv 104; plt 162; glucose 80; bun 24; creat 1.0; k+4.1; na++142; liver normal albumin 3.5     Review of Systems  Unable to perform ROS    Physical Exam  Constitutional: No distress.  thin  Neck: Neck supple. No JVD present.  Cardiovascular: Normal rate and intact distal pulses.   Has an occasional irregular beat   Respiratory: Effort normal and breath sounds normal. No respiratory distress. She has no wheezes.  GI: Soft. Bowel sounds are normal. She exhibits no distension. There is no tenderness.  Musculoskeletal: She exhibits edema.  Is able to move all extremities; has trace lower extremity edema.   Neurological: She is alert.    Skin: Skin is warm and dry. She is not diaphoretic.       ASSESSMENT/ PLAN:  1. Embolism/anticoagulation management: will continue her coumadin at 7 mg daily and will monitor her status   2. Essential thrombocytosis: she is stable will continue hydrea 1 gm daily; will monitor  3. Hypothyroidism; is stable will continue synthroid 75 mcg daily   4. Hypertension: she is slightly worse; will increase her cozaar to 75 mg daily and will check her blood pressure twice daily for 2 weeks and report will check bmp in 2 weeks.   5. Dyslipidemia: will continue lipitor 10 mg daily   6. Gerd: will continue protonix 40 mg daily   7. Insomnia: is stable will continue trazodone 25 mg nightly  8. Dementia: will continue her aricept 10 mg daily and will monitor her status.   9. Constipation: will continue senna s twice daily           [name] NP [facility]  Contact [phone] Monday through Friday 8am- 5pm  After hours call [phone]

## 2014-01-03 LAB — BASIC METABOLIC PANEL
BUN: 21 mg/dL (ref 4–21)
Creatinine: 0.8 mg/dL (ref 0.5–1.1)
GLUCOSE: 83 mg/dL
POTASSIUM: 3.9 mmol/L (ref 3.4–5.3)
SODIUM: 143 mmol/L (ref 137–147)

## 2014-01-07 ENCOUNTER — Non-Acute Institutional Stay (SKILLED_NURSING_FACILITY): Payer: Medicare Other | Admitting: Adult Health

## 2014-01-07 DIAGNOSIS — F0390 Unspecified dementia without behavioral disturbance: Secondary | ICD-10-CM

## 2014-01-07 DIAGNOSIS — I749 Embolism and thrombosis of unspecified artery: Secondary | ICD-10-CM

## 2014-01-07 DIAGNOSIS — Z7901 Long term (current) use of anticoagulants: Secondary | ICD-10-CM

## 2014-01-07 DIAGNOSIS — N390 Urinary tract infection, site not specified: Secondary | ICD-10-CM

## 2014-01-12 ENCOUNTER — Encounter: Payer: Self-pay | Admitting: Adult Health

## 2014-01-12 MED ORDER — MEMANTINE HCL 5 MG PO TABS: 5.0000 mg | ORAL_TABLET | Freq: Two times a day (BID) | ORAL | Status: DC

## 2014-01-12 NOTE — Progress Notes (Signed)
Patient ID: Tanya Farmer, female   DOB: 10/14/1925, 80 y.o.   MRN: [id number]     [facility]  No Known Allergies   Chief Complaint  Patient presents with  . Acute Visit    family concerns and inr management     HPI:  She is long term coumadin therapy. Her inr today is 1.8 and she is taking coumadin 7 mg daily. She is tolerating her coumadin without difficulty.  Her daughter is concerned about changes in her gait. She continues to ambulate with staff around the facility. There is no noted rigidity and no tremors reported. She has had an u/a sent and the culture report is pending.    Past Medical History  Diagnosis Date  . Hypertension   . Hyperlipidemia   . Thyroid disease     Hypothyroidism  . GERD (gastroesophageal reflux disease)   . Arthritis     Degenerative  . Peripheral vascular disease     Left   Fem-Pop and  tibial Thrombectomy  . High platelet count   . Dementia   . Pain in toe of right foot   . Hypothyroidism     Past Surgical History  Procedure Laterality Date  . Femoral-popliteal bypass graft  2002    Left  Dr. Sherren Mocha Early  . Abdominal hysterectomy    . Cataract surgery      left eye    VITAL SIGNS BP 136/88  Pulse 99  Ht 5' (1.524 m)  Wt 133 lb (60.328 kg)  BMI 25.97 kg/m2   Patient's Medications  New Prescriptions   No medications on file  Previous Medications   ATORVASTATIN (LIPITOR) 10 MG TABLET    Take 10 mg by mouth daily.   CALCIUM-VITAMIN D (OSCAL WITH D) 500-200 MG-UNIT PER TABLET    Take 1 tablet by mouth 2 (two) times daily.   DONEPEZIL (ARICEPT) 10 MG TABLET    Take 10 mg by mouth daily.    FEEDING SUPPLEMENT, ENSURE COMPLETE, (ENSURE COMPLETE) LIQD    Take 237 mLs by mouth daily.   HYDROXYUREA (HYDREA) 500 MG CAPSULE    Take 1,000 mg by mouth daily. May take with food to minimize GI side effects.   LEVOTHYROXINE (SYNTHROID) 75 MCG TABLET    Take 1 tablet (75 mcg total) by mouth daily before breakfast.   LOSARTAN (COZAAR) 50 MG  TABLET    Take 1.5 tablets (75 mg total) by mouth daily.   MULTIPLE VITAMIN (MULTIVITAMIN WITH MINERALS) TABS TABLET    Take 1 tablet by mouth daily.   PANTOPRAZOLE (PROTONIX) 40 MG TABLET    Take 40 mg by mouth daily.   SENNOSIDES-DOCUSATE SODIUM (SENOKOT-S) 8.6-50 MG TABLET    Take 1 tablet by mouth 2 (two) times daily.   TRAZODONE (DESYREL) 50 MG TABLET    Take 25 mg by mouth at bedtime as needed for sleep.    WARFARIN (COUMADIN) 5 MG TABLET    Take 7 mg by mouth daily.   Modified Medications   No medications on file  Discontinued Medications   No medications on file    SIGNIFICANT DIAGNOSTIC EXAMS  08-20-13: mri of brain Moderate motion artifact.  No evidence of acute infarct.  09-16-13: chest x-ray: Stable cardiomegaly and pulmonary venous congestion. Mild infiltrates developing in the lung bases cannot be excluded. This could be secondary to mild pulmonary edema versus pneumonia.    LABS REVIEWED:  07-05-13: tsh 0.032; vit b12: 913; folate >20 09-16-13: wbc 6.1; hgb  14.1; hct 41.6; mcv 112.1; plt 731; glucose 168; bun 20; creat 0.78; k+4.0; na++143; t protein 8.6; albumin 3.7; BNP 619.3 Urine culture: p mirabilis:  09-18-13; wbc 5.6; hgb 12.8; hct 37.7; mcv 109.9; plt 576; glucose 98; bun 16; creat 0.78; k+3.7; na++139  10-28-13: wbc 4.1; hgb 10.8; hct 33.8; mcv 104; plt 162; glucose 80; bun 24; creat 1.0; k+4.1; na++142; liver normal albumin 3.5     Review of Systems  Unable to perform ROS    Physical Exam  Constitutional: No distress.  thin  Neck: Neck supple. No JVD present.  Cardiovascular: Normal rate and intact distal pulses.   Has an occasional irregular beat   Respiratory: Effort normal and breath sounds normal. No respiratory distress. She has no wheezes.  GI: Soft. Bowel sounds are normal. She exhibits no distension. There is no tenderness.  Musculoskeletal: She exhibits edema.  Is able to move all extremities; has trace lower extremity edema No tremors present;  no thumb rolling present; no rigidity present in joints. Her gait is steady but slow.    Neurological: She is alert.  Skin: Skin is warm and dry. She is not diaphoretic.       ASSESSMENT/ PLAN:  1. Embolism and thrombosis/inr management: she is presently stable for her inr of 1.8; will continue her coumadin 7 mg daily and will check inr in one week will monitor   2. Dementia: will begin her namenda 5 mg daily for one week then increase to 5 mg twice daily. Will setup a neurology consult as her daughter is very concerned about possible parkinson's disease. Will have pt/ot evaluate and treat for gait and posture.

## 2014-01-15 ENCOUNTER — Non-Acute Institutional Stay (SKILLED_NURSING_FACILITY): Payer: Medicare Other | Admitting: Adult Health

## 2014-01-15 DIAGNOSIS — Z7901 Long term (current) use of anticoagulants: Secondary | ICD-10-CM

## 2014-01-15 DIAGNOSIS — E785 Hyperlipidemia, unspecified: Secondary | ICD-10-CM

## 2014-01-15 DIAGNOSIS — I10 Essential (primary) hypertension: Secondary | ICD-10-CM

## 2014-01-15 DIAGNOSIS — K219 Gastro-esophageal reflux disease without esophagitis: Secondary | ICD-10-CM

## 2014-01-15 DIAGNOSIS — I749 Embolism and thrombosis of unspecified artery: Secondary | ICD-10-CM

## 2014-01-15 DIAGNOSIS — K5900 Constipation, unspecified: Secondary | ICD-10-CM

## 2014-01-15 DIAGNOSIS — D473 Essential (hemorrhagic) thrombocythemia: Secondary | ICD-10-CM

## 2014-01-15 DIAGNOSIS — E039 Hypothyroidism, unspecified: Secondary | ICD-10-CM

## 2014-01-15 DIAGNOSIS — F0390 Unspecified dementia without behavioral disturbance: Secondary | ICD-10-CM

## 2014-01-16 LAB — LIPID PANEL
Cholesterol: 137 mg/dL (ref 0–200)
LDL Cholesterol: 71 mg/dL
TRIGLYCERIDES: 81 mg/dL (ref 40–160)

## 2014-01-16 LAB — TSH: TSH: 55.35 u[IU]/mL — AB (ref 0.41–5.90)

## 2014-01-17 ENCOUNTER — Non-Acute Institutional Stay (SKILLED_NURSING_FACILITY): Payer: Medicare Other | Admitting: Adult Health

## 2014-01-17 DIAGNOSIS — E039 Hypothyroidism, unspecified: Secondary | ICD-10-CM

## 2014-01-21 ENCOUNTER — Encounter (INDEPENDENT_AMBULATORY_CARE_PROVIDER_SITE_OTHER): Payer: Self-pay

## 2014-01-21 ENCOUNTER — Encounter: Payer: Self-pay | Admitting: Neurology

## 2014-01-21 ENCOUNTER — Ambulatory Visit (INDEPENDENT_AMBULATORY_CARE_PROVIDER_SITE_OTHER): Payer: Medicare Other | Admitting: Neurology

## 2014-01-21 VITALS — BP 132/76 | HR 75 | Temp 98.3°F | Ht 62.0 in | Wt 133.0 lb

## 2014-01-21 DIAGNOSIS — F0390 Unspecified dementia without behavioral disturbance: Secondary | ICD-10-CM

## 2014-01-21 DIAGNOSIS — R4182 Altered mental status, unspecified: Secondary | ICD-10-CM

## 2014-01-21 DIAGNOSIS — R269 Unspecified abnormalities of gait and mobility: Secondary | ICD-10-CM

## 2014-01-21 NOTE — Progress Notes (Signed)
Subjective:    Patient ID: Tanya Farmer is a 80 y.o. female.  HPI    Star Age, MD, PhD [facility] [address], [address] [place], [place] [address]  Dear Dr. Bubba Camp,   I saw your patient, Tanya Farmer, upon your kind request in the neurological clinic today for initial consultation of her parkinsonism. The patient is accompanied by her daughter and son-in-law today. As you know, Tanya Farmer is an 80 year old right-handed woman with an underlying complex medical history of peripheral vascular disease, status post left femoropopliteal bypass and tibial thrombectomy, hypertension, hyperlipidemia, hypothyroidism, reflux disease, thrombocytosis, dementia (on generic Aricept 10 mg for about 11 years, and recently placed on Namenda), who has been residing at [facility]) since January 2015 and has had ST/OT/PT. Prior to that, she has been living with her daughter and her family for the past 4 years and prior to that she lived alone. She has developed another UTI since her stay at SNF and is currently on the second week of ABx, per daughter, but I do not see an antibiotic on her MAR. She has been on Namenda 5 mg bid. She has been on coumadin.  She has a longstanding hx of memory loss, and there is no evidence of psychosis, hallucinations or delusions. She has fallen 2 times at the SNF, no injuries reported. She has been apathetic and minimally verbal, which is not typical for her. She has not been as outgoing, and not engaged. She was with her family yesterday, and the daughter states that she was verbal and aware of her surroundings and somewhat interactive, not like she has today. She had a brain MRI without contrast on 08/20/2013: There is moderate cerebral atrophy. Confluent periventricular T2 hyperintensity is compatible with advanced chronic small vessel ischemic disease. Small vessel changes are also noted in the pons. Small focus of susceptibility artifact in  the posterior right temporal lobe may reflect a remote microhemorrhage or may be vascular. There is no other evidence for intracranial hemorrhage. In addition, I personally reviewed the images through the PACS system.   Her Past Medical History Is Significant For: Past Medical History  Diagnosis Date  . Hypertension   . Hyperlipidemia   . Thyroid disease     Hypothyroidism  . GERD (gastroesophageal reflux disease)   . Arthritis     Degenerative  . Peripheral vascular disease     Left   Fem-Pop and  tibial Thrombectomy  . High platelet count   . Dementia   . Pain in toe of right foot   . Hypothyroidism     Her Past Surgical History Is Significant For: Past Surgical History  Procedure Laterality Date  . Femoral-popliteal bypass graft  2002    Left  Dr. Sherren Mocha Early  . Abdominal hysterectomy    . Cataract surgery      left eye    Her Family History Is Significant For: Family History  Problem Relation Age of Onset  . Heart failure Mother   . Stroke Father     Her Social History Is Significant For: History   Social History  . Marital Status: Divorced    Spouse Name: N/A    Number of Children: N/A  . Years of Education: N/A   Social History Main Topics  . Smoking status: Former Research scientist (life sciences)  . Smokeless tobacco: Never Used  . Alcohol Use: No  . Drug Use: No  . Sexual Activity: None   Other Topics  Concern  . None   Social History Narrative  . None    Her Allergies Are:  No Known Allergies:   Her Current Medications Are:  Outpatient Encounter Prescriptions as of 01/21/2014  Medication Sig  . atorvastatin (LIPITOR) 10 MG tablet Take 10 mg by mouth daily.  . calcium-vitamin D (OSCAL WITH D) 500-200 MG-UNIT per tablet Take 1 tablet by mouth 2 (two) times daily.  Marland Kitchen donepezil (ARICEPT) 10 MG tablet Take 10 mg by mouth daily.   . feeding supplement, ENSURE COMPLETE, (ENSURE COMPLETE) LIQD Take 237 mLs by mouth daily.  . hydroxyurea (HYDREA) 500 MG capsule Take 1,000  mg by mouth daily. May take with food to minimize GI side effects.  Marland Kitchen levothyroxine (SYNTHROID) 75 MCG tablet Take 1 tablet (75 mcg total) by mouth daily before breakfast.  . losartan (COZAAR) 50 MG tablet Take 1.5 tablets (75 mg total) by mouth daily.  . memantine (NAMENDA) 5 MG tablet Take 1 tablet (5 mg total) by mouth 2 (two) times daily.  . Multiple Vitamin (MULTIVITAMIN WITH MINERALS) TABS tablet Take 1 tablet by mouth daily.  . pantoprazole (PROTONIX) 40 MG tablet Take 40 mg by mouth daily.  . sennosides-docusate sodium (SENOKOT-S) 8.6-50 MG tablet Take 1 tablet by mouth 2 (two) times daily.  . traZODone (DESYREL) 50 MG tablet Take 25 mg by mouth at bedtime as needed for sleep.   Marland Kitchen warfarin (COUMADIN) 5 MG tablet Take 7 mg by mouth daily.   :  Review of Systems:  Out of a complete 14 point review of systems, all are reviewed and negative with the exception of these symptoms as listed below:  Review of Systems  Constitutional: Positive for activity change (disinterest), appetite change, fatigue and unexpected weight change (loss).  HENT: Negative.   Eyes: Negative.   Respiratory: Negative.   Cardiovascular: Negative.   Gastrointestinal:       Incontinence  Endocrine: Positive for cold intolerance.  Genitourinary: Positive for enuresis.  Musculoskeletal: Negative.   Skin: Negative.   Allergic/Immunologic: Negative.   Neurological: Positive for weakness.       Memory loss  Hematological: Bruises/bleeds easily.  Psychiatric/Behavioral: Positive for confusion and sleep disturbance (e.d.s.).    Objective:  Neurologic Exam  Physical Exam Physical Examination:   Filed Vitals:   01/21/14 0944  BP: 132/76  Pulse: 75  Temp: 98.3 F (36.8 C)    General Examination: The patient is an elderly lady situated in her WC in no apparent distress. She is not verbal, with the exception of intermittent muttering and one time at the end of the visit she says thank you. She is not  participating in memory testing, such as MMSE, clock drawing or animal naming and does not follow verbal commands and does not mimic.   HEENT: Normocephalic, atraumatic, pupils are equal, round and reactive to light and accommodation. Funduscopic exam is not possible as the patient does not keep her eyes open enough. She does not follow the light. She opens her mouth briefly. She has mild mouth dryness. She has missing teeth. Neck is mild to moderately rigid. She has no lip, neck or jaw tremor. She has no significant facial masking. She has no one-sided facial asymmetry.   Chest: is clear to auscultation without wheezing, rhonchi or crackles noted.  Heart: sounds are regular and normal without murmurs, rubs or gallops noted.   Abdomen: is soft, non-tender and non-distended with normal bowel sounds appreciated on auscultation.  Extremities: There is 1+ pitting  edema in the distal lower extremities bilaterally, distally.   Skin: is warm and dry with no trophic changes noted.   Musculoskeletal: exam reveals no obvious joint deformities, tenderness or joint swelling or erythema.   Neurologically:  Mental status: The patient is awake and alert, paying poor attention. She is essentially nonverbal. She is not able to participate in memory testing. She does not respond to verbal cues and does not mimic. She seems to be lethargic. She keeps her eyes shut most of the time and needs verbal commands to stay awake and also tactile stimuli to open her eyes. She moves all 4 extremities. Tone is mildly increased throughout with mild cogwheeling noted in both upper extremities. She has no resting tremor. Briefly she keeps her hands straight and does not have any significant postural or action tremor. She does not participate in fine motor testing. We did try to stand her with assistance but she does not participate in has a tendency to fall back into the wheelchair. I did not have her walk for me. She did not  participate for Romberg testing. She did not participate for cerebellar testing.  Assessment and Plan:   In summary, Tanya Farmer is a very pleasant 80 y.o.-year old female with an underlying complex medical history of peripheral vascular disease, status post left femoropopliteal bypass and tibial thrombectomy, hypertension, hyperlipidemia, hypothyroidism, reflux disease, thrombocytosis, dementia which has been long-standing for which she has been on Aricept for over 11 years, who presents with altered mental status. This is in the context of recurrent UTI. Differential diagnoses includes encephalopathy secondary to infectious cause, metabolic derangement, dehydration, progressive dementia, superimposed depression. While she does not have telltale signs of idiopathic Parkinson's disease, she does have bilateral cogwheeling noted. I did explain to the patient's daughter and her daughter's husband that sometimes patients with advanced dementia can exhibit signs of parkinsonism which does not signify true Parkinson's disease. Sometimes patients with advanced vascular dementia can have vascular parkinsonism which affects primarily gait and balance. I could not test her posture or gait so my comment on the parkinsonism is very limited. She is not participating in testing today. This seems to be very unusual for her personality and her baseline. At this juncture I will proceed with a CT head, blood work and EEG. She may benefit from reevaluation for UTI and successful treatments with her recent antibiotic. She is currently no longer on an antibiotic that I can see. I do not see anything on her list of medications that would make her this lethargic. She does not have any focality on exam. We will call her daughter with the test results. I will see her back routinely, if she needs to be seen sooner we will have her see one of our nurse practitioners.   Thank you very much for allowing me to participate in the care of  this nice patient. If I can be of any further assistance to you please do not hesitate to call me at [phone].  Sincerely,   Star Age, MD, PhD

## 2014-01-21 NOTE — Patient Instructions (Signed)
We will do blood work today.  We will do head CT and an EEG, which is a brain wave test.

## 2014-01-22 ENCOUNTER — Emergency Department (HOSPITAL_COMMUNITY)
Admission: EM | Admit: 2014-01-22 | Discharge: 2014-01-22 | Disposition: A | Discharge status: Home / Self Care | Payer: Medicare Other | Attending: Emergency Medicine | Admitting: Emergency Medicine

## 2014-01-22 ENCOUNTER — Emergency Department (HOSPITAL_COMMUNITY): Payer: Medicare Other

## 2014-01-22 ENCOUNTER — Telehealth: Payer: Self-pay | Admitting: Neurology

## 2014-01-22 ENCOUNTER — Encounter (HOSPITAL_COMMUNITY): Payer: Self-pay | Admitting: Emergency Medicine

## 2014-01-22 DIAGNOSIS — D75839 Thrombocytosis, unspecified: Secondary | ICD-10-CM

## 2014-01-22 DIAGNOSIS — Z87891 Personal history of nicotine dependence: Secondary | ICD-10-CM | POA: Insufficient documentation

## 2014-01-22 DIAGNOSIS — Z7901 Long term (current) use of anticoagulants: Secondary | ICD-10-CM | POA: Insufficient documentation

## 2014-01-22 DIAGNOSIS — K219 Gastro-esophageal reflux disease without esophagitis: Secondary | ICD-10-CM | POA: Insufficient documentation

## 2014-01-22 DIAGNOSIS — R4182 Altered mental status, unspecified: Secondary | ICD-10-CM | POA: Insufficient documentation

## 2014-01-22 DIAGNOSIS — Z8639 Personal history of other endocrine, nutritional and metabolic disease: Secondary | ICD-10-CM | POA: Insufficient documentation

## 2014-01-22 DIAGNOSIS — I10 Essential (primary) hypertension: Secondary | ICD-10-CM | POA: Insufficient documentation

## 2014-01-22 DIAGNOSIS — E039 Hypothyroidism, unspecified: Secondary | ICD-10-CM

## 2014-01-22 DIAGNOSIS — D473 Essential (hemorrhagic) thrombocythemia: Secondary | ICD-10-CM

## 2014-01-22 DIAGNOSIS — Z79899 Other long term (current) drug therapy: Secondary | ICD-10-CM | POA: Insufficient documentation

## 2014-01-22 DIAGNOSIS — F0390 Unspecified dementia without behavioral disturbance: Secondary | ICD-10-CM | POA: Insufficient documentation

## 2014-01-22 DIAGNOSIS — Z862 Personal history of diseases of the blood and blood-forming organs and certain disorders involving the immune mechanism: Secondary | ICD-10-CM | POA: Insufficient documentation

## 2014-01-22 DIAGNOSIS — Z8739 Personal history of other diseases of the musculoskeletal system and connective tissue: Secondary | ICD-10-CM | POA: Insufficient documentation

## 2014-01-22 LAB — CBC WITH DIFFERENTIAL/PLATELET
BASOS PCT: 2 % — AB (ref 0–1)
Basophils Absolute: 0.1 10*3/uL (ref 0.0–0.1)
Eosinophils Absolute: 0.1 10*3/uL (ref 0.0–0.7)
Eosinophils Relative: 2 % (ref 0–5)
HEMATOCRIT: 39.5 % (ref 36.0–46.0)
HEMOGLOBIN: 12.6 g/dL (ref 12.0–15.0)
Lymphocytes Relative: 21 % (ref 12–46)
Lymphs Abs: 1 10*3/uL (ref 0.7–4.0)
MCH: 36.1 pg — ABNORMAL HIGH (ref 26.0–34.0)
MCHC: 31.9 g/dL (ref 30.0–36.0)
MCV: 113.2 fL — AB (ref 78.0–100.0)
MONO ABS: 0.3 10*3/uL (ref 0.1–1.0)
Monocytes Relative: 7 % (ref 3–12)
NEUTROS PCT: 68 % (ref 43–77)
Neutro Abs: 3.2 10*3/uL (ref 1.7–7.7)
Platelets: 794 10*3/uL — ABNORMAL HIGH (ref 150–400)
RBC: 3.49 MIL/uL — ABNORMAL LOW (ref 3.87–5.11)
RDW: 16.3 % — ABNORMAL HIGH (ref 11.5–15.5)
Smear Review: INCREASED
WBC: 4.7 10*3/uL (ref 4.0–10.5)

## 2014-01-22 LAB — URINE MICROSCOPIC-ADD ON

## 2014-01-22 LAB — URINALYSIS, ROUTINE W REFLEX MICROSCOPIC
BILIRUBIN URINE: NEGATIVE
GLUCOSE, UA: NEGATIVE mg/dL
HGB URINE DIPSTICK: NEGATIVE
Ketones, ur: NEGATIVE mg/dL
Nitrite: NEGATIVE
Protein, ur: NEGATIVE mg/dL
Specific Gravity, Urine: 1.01 (ref 1.005–1.030)
UROBILINOGEN UA: 0.2 mg/dL (ref 0.0–1.0)
pH: 7.5 (ref 5.0–8.0)

## 2014-01-22 LAB — BASIC METABOLIC PANEL
BUN: 16 mg/dL (ref 6–23)
CALCIUM: 9.4 mg/dL (ref 8.4–10.5)
CO2: 28 meq/L (ref 19–32)
Chloride: 106 mEq/L (ref 96–112)
Creatinine, Ser: 0.78 mg/dL (ref 0.50–1.10)
GFR calc Af Amer: 85 mL/min — ABNORMAL LOW (ref >90)
GFR, EST NON AFRICAN AMERICAN: 74 mL/min — AB (ref >90)
GLUCOSE: 106 mg/dL — AB (ref 70–99)
Potassium: 3.5 mEq/L — ABNORMAL LOW (ref 3.7–5.3)
Sodium: 146 mEq/L (ref 137–147)

## 2014-01-22 LAB — TSH: TSH: 43.42 u[IU]/mL — AB (ref 0.350–4.500)

## 2014-01-22 MED ORDER — CRANBERRY 125 MG PO TABS: 125.0000 mg | ORAL_TABLET | Freq: Every day | ORAL | Status: DC

## 2014-01-22 MED ORDER — SODIUM CHLORIDE 0.9 % IV BOLUS (SEPSIS)
500.0000 mL | Freq: Once | INTRAVENOUS | Status: AC
  Administered 2014-01-22: 500 mL via INTRAVENOUS

## 2014-01-22 NOTE — ED Notes (Signed)
Pt reports to the ED via [facility] from [facility] per family request. Pt was seen at the neurologist yesterday for eval of increased apathy and lethargy and that she is no longer as interactive with family. Pt has a significant medical hx including advancing dementia. Pt verbal at this time when asked questions. She is disoriented x 4 and lethargic. Pt has also had recurring UTIs in the past. Per neurologist noted pt family member stated that on 5/25 she was verbal, somewhat aware of her surroundings, and more interactive. Family was also concerned for the development of Parkinson's because the pt has developed tremors. The neurologist did an assessment and reports the symptoms are likely related to her advancing dementia vs. Parkinson's. Also family was concerned because her platelets were high as well as her TSH. Per EMS pt has hx of hypothyroidism and high platelets in the past. Pt calm and cooperative. Resp e/u and skin warm and dry.

## 2014-01-22 NOTE — Discharge Instructions (Signed)
Altered Mental Status Altered mental status most often refers to an abnormal change in your responsiveness and awareness. It can affect your speech, thought, mobility, memory, attention span, or alertness. It can range from slight confusion to complete unresponsiveness (coma). Altered mental status can be a sign of a serious underlying medical condition. Rapid evaluation and medical treatment is necessary for patients having an altered mental status. CAUSES   Low blood sugar (hypoglycemia) or diabetes.  Severe loss of body fluids (dehydration) or a body salt (electrolyte) imbalance.  A stroke or other neurologic problem, such as dementia or delirium.  A head injury or tumor.  A drug or alcohol overdose.  Exposure to toxins or poisons.  Depression, anxiety, and stress.  A low oxygen level (hypoxia).  An infection.  Blood loss.  Twitching or shaking (seizure).  Heart problems, such as heart attack or heart rhythm problems (arrhythmias).  A body temperature that is too low or too high (hypothermia or hyperthermia). DIAGNOSIS  A diagnosis is based on your history, symptoms, physical and neurologic examinations, and diagnostic tests. Diagnostic tests may include:  Measurement of your blood pressure, pulse, breathing, and oxygen levels (vital signs).  Blood tests.  Urine tests.  X-ray exams.  A computerized magnetic scan (magnetic resonance imaging, MRI).  A computerized X-ray scan (computed tomography, CT scan). TREATMENT  Treatment will depend on the cause. Treatment may include:  Management of an underlying medical or mental health condition.  Critical care or support in the hospital. [place]   Only take over-the-counter or prescription medicines for pain, discomfort, or fever as directed by your caregiver.  Manage underlying conditions as directed by your caregiver.  Eat a healthy, well-balanced diet to maintain strength.  Join a support group or  prevention program to cope with the condition or trauma that caused the altered mental status. Ask your caregiver to help choose a program that works for you.  Follow up with your caregiver for further examination, therapy, or testing as directed. SEEK MEDICAL CARE IF:   You feel unwell or have chills.  You or your family notice a change in your behavior or your alertness.  You have trouble following your caregiver's treatment plan.  You have questions or concerns. SEEK IMMEDIATE MEDICAL CARE IF:   You have a rapid heartbeat or have chest pain.  You have difficulty breathing.  You have a fever.  You have a headache with a stiff neck.  You cough up blood.  You have blood in your urine or stool.  You have severe agitation or confusion. MAKE SURE YOU:   Understand these instructions.  Will watch your condition.  Will get help right away if you are not doing well or get worse. Document Released: 02/02/2010 Document Revised: 11/07/2011 Document Reviewed: 02/02/2010 [facility] Patient Information 2014 [place]. Dehydration, Elderly Dehydration is when you lose more fluids from the body than you take in. Vital organs such as the kidneys, brain, and heart cannot function without a proper amount of fluids and salt. Any loss of fluids from the body can cause dehydration.  Older adults are at a higher risk of dehydration than younger adults. As we age, our bodies are less able to conserve water and do not respond to temperature changes as well. Also, older adults do not become thirsty as easily or quickly. Because of this, older adults often do not realize they need to increase fluids to avoid dehydration.  CAUSES   Vomiting.  Diarrhea.  Excessive sweating.  Excessive urination.  Fever.  Certain medicines, such as blood pressure medicines called diuretics.  Poorly controlled blood sugars. SIGNS AND SYMPTOMS  Mild dehydration:  Thirst.  Dry lips.  Slightly dry  mouth. Moderate dehydration:  Very dry mouth.  Sunken eyes.  Skin does not bounce back quickly when lightly pinched and released.  Dark urine and decreased urine production.  Decreased tear production.  Headache. Severe dehydration:  Very dry mouth.  Extreme thirst.  Rapid, weak pulse (more than 100 beats per minute at rest).  Cold hands and feet.  Not able to sweat in spite of heat.  Rapid breathing.  Blue lips.  Confusion and lethargy.  Difficulty being awakened.  Minimal urine production.  No tears. DIAGNOSIS  Your health care provider will diagnose dehydration based on your symptoms and your exam. Blood and urine tests will help confirm the diagnosis. The diagnostic evaluation should also identify the cause of dehydration. TREATMENT  Treatment of mild or moderate dehydration can often be done at home by increasing the amount of fluids that you drink. It is best to drink small amounts of fluid more often. Drinking too much at one time can make vomiting worse. Severe dehydration needs to be treated at the hospital. You may be given IV fluids that contain water and electrolytes. HOME CARE INSTRUCTIONS   Ask your health care provider about specific rehydration instructions.  Drink enough fluids to keep your urine clear or pale yellow.  Drink small amounts frequently if you have nausea and vomiting.  Eat as you normally do.  Avoid:  Foods or drinks high in sugar.  Carbonated drinks.  Juice.  Extremely hot or cold fluids.  Drinks with caffeine.  Fatty, greasy foods.  Alcohol.  Tobacco.  Overeating.  Gelatin desserts.  Wash your hands well to avoid spreading bacteria and viruses.  Only take over-the-counter or prescription medicines for pain, discomfort, or fever as directed by your health care provider.  Ask your health care provider if you should continue all prescribed and over-the-counter medicines.  Keep all follow-up appointments with  your health care provider. SEEK MEDICAL CARE IF:  You have abdominal pain, and it increases or stays in one area (localizes).  You have a rash, stiff neck, or severe headache.  You are irritable, sleepy, or difficult to awaken.  You are weak, dizzy, or extremely thirsty. SEEK IMMEDIATE MEDICAL CARE IF:   You are unable to keep fluids down, or you get worse despite treatment.  You have frequent episodes of vomiting or diarrhea.  You have blood or green matter (bile) in your vomit.  You have blood in your stool, or your stool looks black and tarry.  You have not urinated in 6 8 hours, or you have only urinated a small amount of very dark urine.  You have a fever.  You faint. MAKE SURE YOU:   Understand these instructions.  Will watch your condition.  Will get help right away if you are not doing well or get worse. Document Released: 11/05/2003 Document Revised: 06/05/2013 Document Reviewed: 04/22/2013 [facility] Patient Information 2014 [place].

## 2014-01-22 NOTE — ED Notes (Signed)
Pt ambulated with this RN and NT, pt ambulated well.

## 2014-01-22 NOTE — ED Notes (Signed)
Report given to [facility] at [facility]. Informed pt is en route to facility via family.

## 2014-01-22 NOTE — ED Notes (Signed)
Discharge instructions reviewed with pt's daughter. Family driving pt home to [facility].

## 2014-01-22 NOTE — ED Provider Notes (Signed)
TIME SEEN: 5:35 PM  CHIEF COMPLAINT: Altered mental status  HPI: Patient is a 80 year old female with history of dementia, hypertension, hyperlipidemia, hypothyroidism, thrombocytosis on hydroxyurea and followed by Dr. Fransisco Beau at [facility] who presents to the emergency department with altered mental status. History is provided by patient's daughter given patient's altered mental status. Patient's daughter reports that patient had a urinary tract infection in January 2015 and was admitted to the hospital. Since discharge she has been living at [facility]. They state that normally she is able to walk without difficulty and will answer questions and follow commands. They state over the past several days she has not been as responsive, cooperative answering questions. She was seen by her neurologist Dr. Maxie Barb and go for neurology yesterday and had blood work drawn. Her neurologist call them today and told them that her B12 level was abnormal, TSH was 34.6 and her platelet count was 855. They recommended that she come to the emergency department. Family denies that she's had any recent head injury and she is not on anticoagulation. They're not aware of any infectious symptoms including fever, cough or vomiting or diarrhea. They're concerned that she may have another urinary tract infection as these are the symptoms she had back in January 2015.  ROS: Unobtainable due to patient's altered mental status and underlying dementia  PAST MEDICAL HISTORY/PAST SURGICAL HISTORY:  Past Medical History  Diagnosis Date  . Hypertension   . Hyperlipidemia   . Thyroid disease     Hypothyroidism  . GERD (gastroesophageal reflux disease)   . Arthritis     Degenerative  . Peripheral vascular disease     Left   Fem-Pop and  tibial Thrombectomy  . High platelet count   . Dementia   . Pain in toe of right foot   . Hypothyroidism     MEDICATIONS:  Prior to Admission medications   Medication Sig Start Date  End Date Taking? Authorizing Provider  atorvastatin (LIPITOR) 10 MG tablet Take 10 mg by mouth daily.   Yes Historical Provider, MD  calcium-vitamin D (OSCAL WITH D) 500-200 MG-UNIT per tablet Take 1 tablet by mouth 2 (two) times daily.   Yes Historical Provider, MD  donepezil (ARICEPT) 10 MG tablet Take 10 mg by mouth daily.    Yes Historical Provider, MD  feeding supplement, ENSURE COMPLETE, (ENSURE COMPLETE) LIQD Take 237 mLs by mouth daily. 09/20/13  Yes Irven Shelling, MD  hydroxyurea (HYDREA) 500 MG capsule Take 1,000 mg by mouth daily. May take with food to minimize GI side effects.   Yes Historical Provider, MD  levothyroxine (SYNTHROID) 75 MCG tablet Take 1 tablet (75 mcg total) by mouth daily before breakfast. 07/07/13  Yes Irven Shelling, MD  losartan (COZAAR) 50 MG tablet Take 1.5 tablets (75 mg total) by mouth daily. 12/18/13  Yes Gerlene Fee, NP  memantine (NAMENDA) 5 MG tablet Take 1 tablet (5 mg total) by mouth 2 (two) times daily. 01/12/14  Yes Gerlene Fee, NP  Multiple Vitamin (MULTIVITAMIN WITH MINERALS) TABS tablet Take 1 tablet by mouth daily.   Yes Historical Provider, MD  Multiple Vitamins-Minerals (MULTI COMPLETE PO) Take 1 tablet by mouth daily.   Yes Historical Provider, MD  pantoprazole (PROTONIX) 40 MG tablet Take 40 mg by mouth daily.   Yes Historical Provider, MD  sennosides-docusate sodium (SENOKOT-S) 8.6-50 MG tablet Take 1 tablet by mouth 2 (two) times daily.   Yes Historical Provider, MD  traZODone (DESYREL) 50  MG tablet Take 25 mg by mouth at bedtime as needed for sleep.    Yes Historical Provider, MD  warfarin (COUMADIN) 2.5 MG tablet Take 2.5 mg by mouth daily. Take with 4 mg to equal 6.5 mg   Yes Historical Provider, MD  warfarin (COUMADIN) 4 MG tablet Take 4 mg by mouth daily. Take with 2.5 mg to equal a total of 6.5 mg   Yes Historical Provider, MD  warfarin (COUMADIN) 5 MG tablet Take 7 mg by mouth daily.     Historical Provider, MD     ALLERGIES:  No Known Allergies  SOCIAL HISTORY:  History  Substance Use Topics  . Smoking status: Former Research scientist (life sciences)  . Smokeless tobacco: Never Used  . Alcohol Use: No    FAMILY HISTORY: Family History  Problem Relation Age of Onset  . Heart failure Mother   . Stroke Father     EXAM: BP 179/82  Pulse 71  Temp(Src) 99 F (37.2 C) (Oral)  Resp 16  SpO2 100% CONSTITUTIONAL: Alert and oriented to person but does not respond to questions appropriately and will intermittently follow commands. Well-appearing; well-nourished; no apparent distress HEAD: Normocephalic EYES: Conjunctivae clear, PERRL ENT: normal nose; no rhinorrhea; slightly dry mucous membranes; pharynx without lesions noted NECK: Supple, no meningismus, no LAD  CARD: RRR; S1 and S2 appreciated; no murmurs, no clicks, no rubs, no gallops RESP: Normal chest excursion without splinting or tachypnea; breath sounds clear and equal bilaterally; no wheezes, no rhonchi, no rales,  ABD/GI: Normal bowel sounds; non-distended; soft, non-tender, no rebound, no guarding BACK:  The back appears normal and is non-tender to palpation, there is no CVA tenderness EXT: Normal ROM in all joints; non-tender to palpation; no edema; normal capillary refill; no cyanosis    SKIN: Normal color for age and race; warm NEURO: Moves all extremities equally, no pronator drift, cranial nerves 2-12 intact, no slurred speech, unable to test sensation as patient cannot answer this question, intermittently follows commands PSYCH: The patient's mood and manner are appropriate. Grooming and personal hygiene are appropriate.  MEDICAL DECISION MAKING: Patient here with altered mental status over the past several days. She is hemodynamically stable. No obvious focal neurologic deficit on exam. Differential diagnosis includes stroke, intracranial hemorrhage, ACS, infection, anemia, electrolyte abnormality, dehydration. Family denies any medication changes.  We'll obtain labs, head CT, chest x-ray and urine. Will give IV fluids.  ED PROGRESS: Patient's labs show a TSH of 43. Her platelet count 794. Her head CT shows no acute abnormality. Chest x-ray and urine pending.  10:00 PM  CXR  Shows no acute abnormality. Head CT is also negative. Urine shows no sign of infection. Patient is now back to her baseline and is answering questions and moving all extremities and following commands. She seemed to improve with IV fluids. Suspect mild dehydration. We'll attempt to ambulate patient but suspect she can go back to her nursing facility with close outpatient followup for her chronic thrombocytosis and hypothyroidism.  10:30 PM  Pt was able to ambulate with assistance and her family reports this is the best that she has done in quite a long time. They're comfortable with taking her back to [facility]. The plan of following up with her PCP Dr. Laurann Montana for her hypothyroidism and Dr. Fransisco Beau for her thrombocytosis. They have asked me to write a prescription for cranberry tablets for urinary tract prevention, physical therapy and for her to receive Ensure with every meal. Have discussed strict return precautions, offer  reassurance and discussed supportive care instructions. They verbalize understanding and are comfortable with plan to be discharged home.    EKG Interpretation  Date/Time:  Wednesday Jan 22 2014 18:30:08 EDT Ventricular Rate:  76 PR Interval:  153 QRS Duration: 75 QT Interval:  432 QTC Calculation: 486 R Axis:   -33 Text Interpretation:  Normal sinus rhythm Left axis deviation Borderline prolonged QT interval No significant change since last tracing Confirmed by WARD,  DO, KRISTEN [phone]) on 01/22/2014 6:34:30 PM        [place], DO 01/22/14 2235

## 2014-01-22 NOTE — Telephone Encounter (Signed)
Lab results have been routed to Dr. Laurann Montana.  LVM at daughter's home number with instructions to contact Dr. Laurann Montana for an appointment for her mother ASAP.  Contact information was left for a call-back with any questions.

## 2014-01-22 NOTE — Progress Notes (Signed)
Quick Note:  Called and talked to patient's daughter and son-in-law about her highly abnormal TSH, and her very high platelet count. On trying to get in touch with her primary care physician regarding her abnormal thyroid function and her altered mental status which may be in part due to her abnormal thyroid function. Richardson Landry: Please fax blood test results to Dr. Ladell Heads office as well and is patient's daughter at [phone].  Star Age, MD, PhD [facility]  ______

## 2014-01-22 NOTE — ED Notes (Signed)
Social work: Please call pt's daughter or son to discuss options with family. Family member phone numbers confirmed in pt's chart.

## 2014-01-22 NOTE — Telephone Encounter (Signed)
I called and talked to the patient's daughter regarding the pt's abnormal blood test results. Her TSH was rather high at 34. I have talked to Dr. Elenora Gamma at [place] at [place], as Dr.Griffin was not in this afternoon. She stated that she would send a message to Dr. Laurann Montana and that we should ask the daughter to make an appointment for the patient to see him as soon as possible.  Please call patient's daughter at [phone] and advised her to call Dr. Delene Ruffini office to make an appointment ASAP.

## 2014-01-22 NOTE — ED Notes (Signed)
PT in CT.

## 2014-01-22 NOTE — ED Notes (Signed)
MD at bedside. 

## 2014-01-23 ENCOUNTER — Telehealth: Payer: Self-pay | Admitting: Neurology

## 2014-01-23 LAB — T4, FREE: Free T4: 0.86 ng/dL (ref 0.80–1.80)

## 2014-01-23 LAB — T3, FREE: T3 FREE: 1.7 pg/mL — AB (ref 2.3–4.2)

## 2014-01-23 MED ORDER — LEVOTHYROXINE SODIUM 100 MCG PO TABS: 100.0000 ug | ORAL_TABLET | Freq: Every day | ORAL | Status: DC

## 2014-01-23 NOTE — Telephone Encounter (Signed)
Daughter calling and requesting CT scan be cancelled.  Mother had scan at [facility] last night. Hospital is requesting Dr Rexene Alberts review CT scan and notes.  Daughter requesting a return call.  Thanks

## 2014-01-23 NOTE — Progress Notes (Signed)
Patient ID: Tanya Farmer, female   DOB: [birth year], 80 y.o.   MRN: [id number]     [facility]  No Known Allergies   Chief Complaint  Patient presents with  . Acute Visit    follow up lab     HPI:  Her cholesterol panel is fine. Her tsh is very high at 55. She has been on synthroid 75 mcg daily for long term therapy. There are no known missed doses. There are no concerns being voiced by the nursing staff. She continues to be ambulated by the nursing staff throughout the facility on a daily basis.    Past Medical History  Diagnosis Date  . Hypertension   . Hyperlipidemia   . Thyroid disease     Hypothyroidism  . GERD (gastroesophageal reflux disease)   . Arthritis     Degenerative  . Peripheral vascular disease     Left   Fem-Pop and  tibial Thrombectomy  . High platelet count   . Dementia   . Pain in toe of right foot   . Hypothyroidism     Past Surgical History  Procedure Laterality Date  . Femoral-popliteal bypass graft  2002    Left  Dr. Sherren Mocha Early  . Abdominal hysterectomy    . Cataract surgery      left eye    VITAL SIGNS BP 118/75  Pulse 68  Ht 5\' 2"  (1.575 m)  Wt 133 lb (60.328 kg)  BMI 24.32 kg/m2   Patient's Medications  New Prescriptions   No medications on file  Previous Medications   ATORVASTATIN (LIPITOR) 10 MG TABLET    Take 10 mg by mouth daily.   CALCIUM-VITAMIN D (OSCAL WITH D) 500-200 MG-UNIT PER TABLET    Take 1 tablet by mouth 2 (two) times daily.   CRANBERRY 125 MG TABS    Take 125 mg by mouth daily.   DONEPEZIL (ARICEPT) 10 MG TABLET    Take 10 mg by mouth daily.    FEEDING SUPPLEMENT, ENSURE COMPLETE, (ENSURE COMPLETE) LIQD    Take 237 mLs by mouth daily.   HYDROXYUREA (HYDREA) 500 MG CAPSULE    Take 1,000 mg by mouth daily. May take with food to minimize GI side effects.   LEVOTHYROXINE (SYNTHROID) 75 MCG TABLET    Take 1 tablet (75 mcg total) by mouth daily before breakfast.   LOSARTAN (COZAAR) 50 MG TABLET    Take 1.5 tablets (75  mg total) by mouth daily.   MEMANTINE (NAMENDA) 5 MG TABLET    Take 1 tablet (5 mg total) by mouth 2 (two) times daily.   MULTIPLE VITAMIN (MULTIVITAMIN WITH MINERALS) TABS TABLET    Take 1 tablet by mouth daily.   MULTIPLE VITAMINS-MINERALS (MULTI COMPLETE PO)    Take 1 tablet by mouth daily.   PANTOPRAZOLE (PROTONIX) 40 MG TABLET    Take 40 mg by mouth daily.   SENNOSIDES-DOCUSATE SODIUM (SENOKOT-S) 8.6-50 MG TABLET    Take 1 tablet by mouth 2 (two) times daily.   TRAZODONE (DESYREL) 50 MG TABLET    Take 25 mg by mouth at bedtime as needed for sleep.    WARFARIN (COUMADIN) 2.5 MG TABLET    Take 2.5 mg by mouth daily. Take with 4 mg to equal 6.5 mg   WARFARIN (COUMADIN) 4 MG TABLET    Take 4 mg by mouth daily. Take with 2.5 mg to equal a total of 6.5 mg  Modified Medications   No medications on file  Discontinued Medications   No medications on file    SIGNIFICANT DIAGNOSTIC EXAMS   08-20-13: mri of brain Moderate motion artifact.  No evidence of acute infarct.  09-16-13: chest x-ray: Stable cardiomegaly and pulmonary venous congestion. Mild infiltrates developing in the lung bases cannot be excluded. This could be secondary to mild pulmonary edema versus pneumonia.    LABS REVIEWED:  07-05-13: tsh 0.032; vit b12: 913; folate >20 09-16-13: wbc 6.1; hgb 14.1; hct 41.6; mcv 112.1; plt 731; glucose 168; bun 20; creat 0.78; k+4.0; na++143; t protein 8.6; albumin 3.7; BNP 619.3 Urine culture: p mirabilis:  09-18-13; wbc 5.6; hgb 12.8; hct 37.7; mcv 109.9; plt 576; glucose 98; bun 16; creat 0.78; k+3.7; na++139  10-28-13: wbc 4.1; hgb 10.8; hct 33.8; mcv 104; plt 162; glucose 80; bun 24; creat 1.0; k+4.1; na++142; liver normal albumin 3.5  01-01-14: glucose 83; bun 21; creat 0.8; k+3.9; na++143  01-03-14: urine culture: proteus mirabilis: augmentin 01-16-14: chol 137; ldl 71; trig 81; tsh 55.354      Review of Systems  Unable to perform ROS    Physical Exam  Constitutional: No distress.    thin  Neck: Neck supple. No JVD present.  Cardiovascular: Normal rate and intact distal pulses.   Has an occasional irregular beat   Respiratory: Effort normal and breath sounds normal. No respiratory distress. She has no wheezes.  GI: Soft. Bowel sounds are normal. She exhibits no distension. There is no tenderness.  Musculoskeletal: She exhibits edema.  Is able to move all extremities; has trace lower extremity edema.   Neurological: She is alert.  Skin: Skin is warm and dry. She is not diaphoretic.    ASSESSMENT/ PLAN:  1. Hypothyroidism: is worse; will increase her synthroid to 100 mcg daily and will continue to monitor her status.

## 2014-01-23 NOTE — Progress Notes (Signed)
Patient ID: Tanya Farmer, female   DOB: 11/04/1925, 80 y.o.   MRN: [id number]     [facility]  No Known Allergies   Chief Complaint  Patient presents with  . Medical Management of Chronic Issues    HPI:  She is being seen for the management of her chronic illnesses. She is being treated for an uti with augmentin. She continues to be ambulated throughout the facility by the nursing staff on a daily basis. She comes out to the common area for her meals. There are no concerns being voiced by the nursing staff at this time. She is unable to fully participate in the hpi   Past Medical History  Diagnosis Date  . Hypertension   . Hyperlipidemia   . Thyroid disease     Hypothyroidism  . GERD (gastroesophageal reflux disease)   . Arthritis     Degenerative  . Peripheral vascular disease     Left   Fem-Pop and  tibial Thrombectomy  . High platelet count   . Dementia   . Pain in toe of right foot   . Hypothyroidism     Past Surgical History  Procedure Laterality Date  . Femoral-popliteal bypass graft  2002    Left  Dr. Sherren Mocha Early  . Abdominal hysterectomy    . Cataract surgery      left eye    VITAL SIGNS BP 122/68  Pulse 77  Ht 5' (1.524 m)  Wt 133 lb (60.328 kg)  BMI 25.97 kg/m2  SpO2 98%   Patient's Medications  New Prescriptions   No medications on file  Previous Medications   ATORVASTATIN (LIPITOR) 10 MG TABLET    Take 10 mg by mouth daily.   CALCIUM-VITAMIN D (OSCAL WITH D) 500-200 MG-UNIT PER TABLET    Take 1 tablet by mouth 2 (two) times daily.   CRANBERRY 125 MG TABS    Take 125 mg by mouth daily.   DONEPEZIL (ARICEPT) 10 MG TABLET    Take 10 mg by mouth daily.    FEEDING SUPPLEMENT, ENSURE COMPLETE, (ENSURE COMPLETE) LIQD    Take 237 mLs by mouth daily.   HYDROXYUREA (HYDREA) 500 MG CAPSULE    Take 1,000 mg by mouth daily. May take with food to minimize GI side effects.   LEVOTHYROXINE (SYNTHROID) 75 MCG TABLET    Take 1 tablet (75 mcg total) by mouth daily  before breakfast.   LOSARTAN (COZAAR) 50 MG TABLET    Take 1.5 tablets (75 mg total) by mouth daily.   MEMANTINE (NAMENDA) 5 MG TABLET    Take 1 tablet (5 mg total) by mouth 2 (two) times daily.   MULTIPLE VITAMIN (MULTIVITAMIN WITH MINERALS) TABS TABLET    Take 1 tablet by mouth daily.   MULTIPLE VITAMINS-MINERALS (MULTI COMPLETE PO)    Take 1 tablet by mouth daily.   PANTOPRAZOLE (PROTONIX) 40 MG TABLET    Take 40 mg by mouth daily.   SENNOSIDES-DOCUSATE SODIUM (SENOKOT-S) 8.6-50 MG TABLET    Take 1 tablet by mouth 2 (two) times daily.   TRAZODONE (DESYREL) 50 MG TABLET    Take 25 mg by mouth at bedtime as needed for sleep.    WARFARIN (COUMADIN) 2.5 MG TABLET    Take 2.5 mg by mouth daily. Take with 4 mg to equal 6.5 mg   WARFARIN (COUMADIN) 4 MG TABLET    Take 4 mg by mouth daily. Take with 2.5 mg to equal a total of 6.5 mg  WARFARIN (COUMADIN) 5 MG TABLET    Take 7 mg by mouth daily.   Modified Medications   No medications on file  Discontinued Medications   No medications on file    SIGNIFICANT DIAGNOSTIC EXAMS  08-20-13: mri of brain Moderate motion artifact.  No evidence of acute infarct.  09-16-13: chest x-ray: Stable cardiomegaly and pulmonary venous congestion. Mild infiltrates developing in the lung bases cannot be excluded. This could be secondary to mild pulmonary edema versus pneumonia.    LABS REVIEWED:  07-05-13: tsh 0.032; vit b12: 913; folate >20 09-16-13: wbc 6.1; hgb 14.1; hct 41.6; mcv 112.1; plt 731; glucose 168; bun 20; creat 0.78; k+4.0; na++143; t protein 8.6; albumin 3.7; BNP 619.3 Urine culture: p mirabilis:  09-18-13; wbc 5.6; hgb 12.8; hct 37.7; mcv 109.9; plt 576; glucose 98; bun 16; creat 0.78; k+3.7; na++139  10-28-13: wbc 4.1; hgb 10.8; hct 33.8; mcv 104; plt 162; glucose 80; bun 24; creat 1.0; k+4.1; na++142; liver normal albumin 3.5  01-01-14: glucose 83; bun 21; creat 0.8; k+3.9; na++143  01-03-14: urine culture: proteus mirabilis: augmentin     Review  of Systems  Unable to perform ROS    Physical Exam  Constitutional: No distress.  thin  Neck: Neck supple. No JVD present.  Cardiovascular: Normal rate and intact distal pulses.   Has an occasional irregular beat   Respiratory: Effort normal and breath sounds normal. No respiratory distress. She has no wheezes.  GI: Soft. Bowel sounds are normal. She exhibits no distension. There is no tenderness.  Musculoskeletal: She exhibits edema.  Is able to move all extremities; has trace lower extremity edema.   Neurological: She is alert.  Skin: Skin is warm and dry. She is not diaphoretic.       ASSESSMENT/ PLAN:  1. Embolism/anticoagulation management: will for her inr of 2.9; her coumadin 7 mg has been on hold due to her elevated inr related to abt use will begin coumadin 6.5 mg daily and will check inr on Friday will monitor her status.   2. Essential thrombocytosis: she is stable will continue hydrea 1 gm daily; will monitor  3. Hypothyroidism; is stable will continue synthroid 75 mcg daily   4. Hypertension: will continue  cozaar to 75 mg daily and will continue to monitor her status   5. Dyslipidemia: will continue lipitor 10 mg daily   6. Gerd: will continue protonix 40 mg daily   7. Insomnia: is stable will continue trazodone 25 mg nightly  8. Dementia: will continue her aricept 10 mg daily and will monitor her status.   9. Constipation: will continue senna s twice daily   Will check lipids and tsh next draw.         [name] NP [facility]  Contact [phone] Monday through Friday 8am- 5pm  After hours call [phone]

## 2014-01-24 ENCOUNTER — Non-Acute Institutional Stay (SKILLED_NURSING_FACILITY): Payer: Medicare Other | Admitting: Adult Health

## 2014-01-24 DIAGNOSIS — Z7901 Long term (current) use of anticoagulants: Secondary | ICD-10-CM

## 2014-01-24 DIAGNOSIS — E039 Hypothyroidism, unspecified: Secondary | ICD-10-CM

## 2014-01-24 DIAGNOSIS — K219 Gastro-esophageal reflux disease without esophagitis: Secondary | ICD-10-CM

## 2014-01-24 DIAGNOSIS — I749 Embolism and thrombosis of unspecified artery: Secondary | ICD-10-CM

## 2014-01-24 DIAGNOSIS — F0390 Unspecified dementia without behavioral disturbance: Secondary | ICD-10-CM

## 2014-01-24 DIAGNOSIS — I10 Essential (primary) hypertension: Secondary | ICD-10-CM

## 2014-01-24 DIAGNOSIS — D473 Essential (hemorrhagic) thrombocythemia: Secondary | ICD-10-CM

## 2014-01-24 DIAGNOSIS — E785 Hyperlipidemia, unspecified: Secondary | ICD-10-CM

## 2014-01-24 LAB — CBC WITH DIFFERENTIAL
Basophils Absolute: 0.1 10*3/uL (ref 0.0–0.2)
Basos: 2 %
Eos: 2 %
Eosinophils Absolute: 0.1 10*3/uL (ref 0.0–0.4)
HEMATOCRIT: 40.7 % (ref 34.0–46.6)
Hemoglobin: 12.9 g/dL (ref 11.1–15.9)
IMMATURE GRANULOCYTES: 0 %
Immature Grans (Abs): 0 10*3/uL (ref 0.0–0.1)
Lymphocytes Absolute: 0.9 10*3/uL (ref 0.7–3.1)
Lymphs: 18 %
MCH: 35.4 pg — AB (ref 26.6–33.0)
MCHC: 31.7 g/dL (ref 31.5–35.7)
MCV: 112 fL — AB (ref 79–97)
MONOS ABS: 0.3 10*3/uL (ref 0.1–0.9)
Monocytes: 7 %
Neutrophils Absolute: 3.5 10*3/uL (ref 1.4–7.0)
Neutrophils Relative %: 71 %
PLATELETS: 855 10*3/uL — AB (ref 150–379)
RBC: 3.64 x10E6/uL — AB (ref 3.77–5.28)
RDW: 17.4 % — ABNORMAL HIGH (ref 12.3–15.4)
WBC: 5 10*3/uL (ref 3.4–10.8)

## 2014-01-24 LAB — TSH: TSH: 34.63 u[IU]/mL — ABNORMAL HIGH (ref 0.450–4.500)

## 2014-01-24 LAB — COMPREHENSIVE METABOLIC PANEL
A/G RATIO: 1.3 (ref 1.1–2.5)
ALBUMIN: 4 g/dL (ref 3.5–4.7)
ALT: 23 IU/L (ref 0–32)
AST: 29 IU/L (ref 0–40)
Alkaline Phosphatase: 54 IU/L (ref 39–117)
BUN/Creatinine Ratio: 20 (ref 11–26)
BUN: 16 mg/dL (ref 8–27)
CALCIUM: 9.4 mg/dL (ref 8.7–10.3)
CO2: 24 mmol/L (ref 18–29)
CREATININE: 0.81 mg/dL (ref 0.57–1.00)
Chloride: 105 mmol/L (ref 97–108)
GFR, EST AFRICAN AMERICAN: 76 mL/min/{1.73_m2} (ref >59)
GFR, EST NON AFRICAN AMERICAN: 66 mL/min/{1.73_m2} (ref >59)
GLUCOSE: 85 mg/dL (ref 65–99)
Globulin, Total: 3 g/dL (ref 1.5–4.5)
POTASSIUM: 3.8 mmol/L (ref 3.5–5.2)
Sodium: 146 mmol/L — ABNORMAL HIGH (ref 134–144)
TOTAL PROTEIN: 7 g/dL (ref 6.0–8.5)
Total Bilirubin: 0.3 mg/dL (ref 0.0–1.2)

## 2014-01-24 LAB — URINE CULTURE: Colony Count: >=100000

## 2014-01-24 LAB — RPR: RPR: NONREACTIVE

## 2014-01-24 LAB — VITAMIN B1, WHOLE BLOOD: Thiamine: 161.5 nmol/L (ref 66.5–200.0)

## 2014-01-24 LAB — HGB A1C W/O EAG: Hgb A1c MFr Bld: 5.7 % — ABNORMAL HIGH (ref 4.8–5.6)

## 2014-01-24 NOTE — Telephone Encounter (Signed)
Results given per Dr. Rexene Alberts. Patient daughter express her sincere gratefulness for all Dr. Rexene Alberts has done. She says her mother has appointment with hematologist on Monday and with PCP on Tuesday. She requested copy of message and scans be faxed to her.

## 2014-01-24 NOTE — Telephone Encounter (Signed)
Called pt's daughter Arimental and she stated that the pt was at the ER at [facility] on 01/22/14 and that they did a CT scan and daughter wanted to know the results of the scan. Daughter also stated that the pt has appt on Monday to see a hematologist and on Tuesday pt sees Dr. Laurann Montana. Please advise

## 2014-01-24 NOTE — Telephone Encounter (Signed)
Please call patient's daughter back and advise her that I looked at the emergency room records. She did not have a urinary tract infection. She had a head CT which was reported as showing no acute changes. They felt that part of her problem was dehydration and she improved after IV fluids. They recommended that she see her hematologist for followup of her thrombocytosis which is known and see her primary care physician for management of her hypothyroidism. No other recommendations were made.

## 2014-01-25 ENCOUNTER — Telehealth (HOSPITAL_BASED_OUTPATIENT_CLINIC_OR_DEPARTMENT_OTHER): Payer: Self-pay | Admitting: Emergency Medicine

## 2014-01-25 NOTE — Telephone Encounter (Signed)
Post ED Visit - Positive Culture Follow-up: Successful Patient Follow-Up  Culture assessed and recommendations reviewed by: []  Wes [place], Pharm.D., BCPS []  Heide Guile, Pharm.D., BCPS [x]  Alycia Rossetti, Pharm.D., BCPS []  Weir, Pharm.D., BCPS, AAHIVP []  Legrand Como, Pharm.D., BCPS, AAHIVP  Positive urine culture  [x]  Patient discharged without antimicrobial prescription and treatment is now indicated []  Organism is resistant to prescribed ED discharge antimicrobial []  Patient with positive blood cultures  Changes discussed with ED provider: Vernie Murders PA-C  New antibiotic prescription: Keflex 500 mg BID x 7 days    [facility] 01/25/2014, 6:20 PM

## 2014-01-26 ENCOUNTER — Encounter: Payer: Self-pay | Admitting: Adult Health

## 2014-01-26 MED ORDER — FISH OIL 1000 MG PO CAPS: 1000.0000 mg | ORAL_CAPSULE | Freq: Every day | ORAL | Status: DC

## 2014-01-26 NOTE — Progress Notes (Signed)
ED Antimicrobial Stewardship Positive Culture Follow Up   Tanya Farmer is an 80 y.o. female who presented to [facility] on 01/22/2014 with a chief complaint of AMS  Chief Complaint  Patient presents with  . Abnormal Lab  . Altered Mental Status    Recent Results (from the past 720 hour(s))  URINE CULTURE     Status: None   Collection Time    01/22/14  8:08 PM      Result Value Ref Range Status   Specimen Description URINE, CATHETERIZED   Final   Special Requests NONE   Final   Culture  Setup Time     Final   Value: 01/22/2014 21:54     Performed at [place]     Final   Value: >=100,000 COLONIES/ML     Performed at [facility]   Culture     Final   Value: KLEBSIELLA PNEUMONIAE     Performed at [facility]   Report Status 01/24/2014 FINAL   Final   Organism ID, Bacteria KLEBSIELLA PNEUMONIAE   Final    [x]  Patient discharged originally without antimicrobial agent and treatment is now indicated  New antibiotic prescription: Keflex 500 mg bid x 7 days  ED Provider: Vernie Murders PA-C   Tanya Farmer 01/26/2014, 3:29 PM Infectious Diseases Pharmacist Phone# [phone]

## 2014-01-26 NOTE — Progress Notes (Signed)
Patient ID: Tanya Farmer, female   DOB: 10-30-1925, 80 y.o.   MRN: [id number]     [facility]  No Known Allergies   Chief Complaint  Patient presents with  . Acute Visit    family concerns     HPI:  Her family is concerned about her overall status. Her family feels as though her status has declined. She is less engaging at this times with those around her. She continues to ambulate with the nursing staff on a daily basis throughout the facility 2-3 times daily. She is able to ambulate with standby assistance. Her weight remains stable at 133 pounds. She does require reminding to drink fluids. Her family had expressed concerns to me about her possibly having parkinson's disease. She was seen by neurology in [place]; who found her tsh elevated; but lower since her synthroid had been increased. Her plt was also elevated. The neurologist did express these concerns to her family. After I had spoken to them at great length; thye opted to have her seen by the ED for further evaluation. She was given IVF; blood work was drawn and UA done (the culture is pending). Her daughter; has spoken with her previous provider Dr. Laurann Montana as well. Her daughter; would like to keep Dr. Laurann Montana; neurology; hematology involved with her care. She has also stated that she would like for myself to be involved as well. She has also expressed concerns about her inr management and the frequency of the monitoring that is being done at this facility. We did discuss this as well.    Past Medical History  Diagnosis Date  . Hypertension   . Hyperlipidemia   . Thyroid disease     Hypothyroidism  . GERD (gastroesophageal reflux disease)   . Arthritis     Degenerative  . Peripheral vascular disease     Left   Fem-Pop and  tibial Thrombectomy  . High platelet count   . Dementia   . Pain in toe of right foot   . Hypothyroidism     Past Surgical History  Procedure Laterality Date  . Femoral-popliteal bypass graft  2002     Left  Dr. Sherren Mocha Early  . Abdominal hysterectomy    . Cataract surgery      left eye    VITAL SIGNS BP 128/88  Pulse 73  Ht 5\' 3"  (1.6 m)  Wt 133 lb (60.328 kg)  BMI 23.57 kg/m2  SpO2 96%   Patient's Medications  New Prescriptions   No medications on file  Previous Medications   ATORVASTATIN (LIPITOR) 10 MG TABLET    Take 10 mg by mouth daily.   CALCIUM-VITAMIN D (OSCAL WITH D) 500-200 MG-UNIT PER TABLET    Take 1 tablet by mouth 2 (two) times daily.   DONEPEZIL (ARICEPT) 10 MG TABLET    Take 10 mg by mouth daily.    FEEDING SUPPLEMENT, ENSURE COMPLETE, (ENSURE COMPLETE) LIQD    Take 237 mLs by mouth daily.   HYDROXYUREA (HYDREA) 500 MG CAPSULE    Take 1,000 mg by mouth daily. May take with food to minimize GI side effects.   LEVOTHYROXINE (SYNTHROID, LEVOTHROID) 100 MCG TABLET    Take 1 tablet (100 mcg total) by mouth daily.   LOSARTAN (COZAAR) 50 MG TABLET    Take 1.5 tablets (75 mg total) by mouth daily.   MEMANTINE (NAMENDA) 5 MG TABLET    Take 1 tablet (5 mg total) by mouth 2 (two) times daily.   MULTIPLE  VITAMIN (MULTIVITAMIN WITH MINERALS) TABS TABLET    Take 1 tablet by mouth daily.   MULTIPLE VITAMINS-MINERALS (MULTI COMPLETE PO)    Take 1 tablet by mouth daily.   PANTOPRAZOLE (PROTONIX) 40 MG TABLET    Take 40 mg by mouth daily.   SENNOSIDES-DOCUSATE SODIUM (SENOKOT-S) 8.6-50 MG TABLET    Take 1 tablet by mouth 2 (two) times daily.   TRAZODONE (DESYREL) 50 MG TABLET    Take 25 mg by mouth at bedtime as needed for sleep.    WARFARIN (COUMADIN) 2.5 MG TABLET    Take 2.5 mg by mouth daily. Take with 4 mg to equal 6.5 mg   WARFARIN (COUMADIN) 4 MG TABLET    Take 4 mg by mouth daily. Take with 2.5 mg to equal a total of 6.5 mg  Modified Medications   Modified Medication Previous Medication   CRANBERRY 125 MG TABS Cranberry 125 MG TABS      Take 250 mg by mouth daily.    Take 125 mg by mouth daily.  Discontinued Medications   WARFARIN (COUMADIN) 5 MG TABLET    Take 7 mg  by mouth daily.     SIGNIFICANT DIAGNOSTIC EXAMS  08-20-13: mri of brain Moderate motion artifact.  No evidence of acute infarct.  09-16-13: chest x-ray: Stable cardiomegaly and pulmonary venous congestion. Mild infiltrates developing in the lung bases cannot be excluded. This could be secondary to mild pulmonary edema versus pneumonia.  01-22-14: ct of head: 1. No acute intracranial abnormality.2. Atrophy and chronic microvascular white matter ischemic changes.  01-22-14: chest x-ray: No acute abnormality.  Chronic cardiomegaly.   LABS REVIEWED:  07-05-13: tsh 0.032; vit b12: 913; folate >20 09-16-13: wbc 6.1; hgb 14.1; hct 41.6; mcv 112.1; plt 731; glucose 168; bun 20; creat 0.78; k+4.0; na++143; t protein 8.6; albumin 3.7; BNP 619.3 Urine culture: p mirabilis:  09-18-13; wbc 5.6; hgb 12.8; hct 37.7; mcv 109.9; plt 576; glucose 98; bun 16; creat 0.78; k+3.7; na++139  10-28-13: wbc 4.1; hgb 10.8; hct 33.8; mcv 104; plt 162; glucose 80; bun 24; creat 1.0; k+4.1; na++142; liver normal albumin 3.5  01-01-14: glucose 83; bun 21; creat 0.8; k+3.9; na++143  01-03-14: urine culture: proteus mirabilis: augmentin 01-16-14: chol 137; ldl 71; trig 81; tsh 55.354  01-21-14: wbc 5.0; hgb 12.9; hct 40.7; mcv 112; plt 855; glucose 85; bun 16; creat 0.81; k+3.9; na++146 liver normal albumin 4.0; hgb a1c 5.7; tsh 34.630; thiamine 161.5; RPR: neg 01-22-14: wbc 4.7; hgb 12.6; hct 39.5; mcv 113.2; plt 794; glucose 106; bun 16; creat 0.78; k+3.5; na++146; tsh 43.420; free T3: 1.7; free T4: 0.86     Review of Systems  Unable to perform ROS    Physical Exam  Constitutional: No distress.  thin  Neck: Neck supple. No JVD present.  Cardiovascular: Normal rate and intact distal pulses.  Heart rate regular  Respiratory: Effort normal and breath sounds normal. No respiratory distress. She has no wheezes.  GI: Soft. Bowel sounds are normal. She exhibits no distension. There is no tenderness.  Musculoskeletal: She  exhibits no edema  Is able to move all extremities; ambulates with staff   Neurological: She is alert.  Skin: Skin is warm and dry. She is not diaphoretic.      ASSESSMENT/ PLAN:   1. Hypertension: she is presently stable will continue her  Cozaar 75 mg daily will monitor   2. Dementia without behavioral disturbance: she has had a slight decline in her status; will continue  aricept 10 mg nighlty; and will continue namenda 5 mg twice daily and will continue to monitor her status   Will stop her trazodone   3. Dyslipidemia: will continue lipitor 10 mg daily and will restart fish oil 1 gm daily; her ldl is 71; trig is 81  4. Hypothyroidism: has improved since her dose change is presently on synthroid 100 mcg daily and will continue to monitor her status   5. Gerd: will continue protonix 40 mg daily   6. Constipation: will stop the senna s at this time  7. UTI: her culture is pending at this time; will continue cranberry 250 mg daily and will monitor   8. Thrombocytosis: her plt is [id number]; this is rather elevated; will continue her hydrea 1 gm daily she is due to see her hematologist on 01-27-14.   9. Thrombosis/embolism: her current inr is 2.3 and she is taking coumadin 6.5 mg daily; her inr is due to be checked 01-27-14. Will continue to monitor her status.     Time spent with patient 90 minutes       [name] NP [facility]  Contact [phone] Monday through Friday 8am- 5pm  After hours call [phone]

## 2014-01-27 ENCOUNTER — Encounter: Payer: Self-pay | Admitting: Adult Health

## 2014-01-27 ENCOUNTER — Non-Acute Institutional Stay (SKILLED_NURSING_FACILITY): Payer: Medicare Other | Admitting: Adult Health

## 2014-01-27 DIAGNOSIS — N390 Urinary tract infection, site not specified: Secondary | ICD-10-CM

## 2014-01-27 DIAGNOSIS — Z7901 Long term (current) use of anticoagulants: Secondary | ICD-10-CM

## 2014-01-27 DIAGNOSIS — I749 Embolism and thrombosis of unspecified artery: Secondary | ICD-10-CM

## 2014-01-27 NOTE — Progress Notes (Signed)
Patient ID: Tanya Farmer, female   DOB: [birth year], 80 y.o.   MRN: [id number]     [facility]  No Known Allergies    Chief Complaint  Patient presents with  . Acute Visit    follow up urine culture and inr management     HPI:  She is on long term coumadin therapy for embolism/thrombosis. Her inr has been stable recently and has been on coumadin 6.5 mg daily. Today her inr is 3.2. Her urine culture she had in the ED demonstrates klebsiella pneumoniae will require treatment with levaquin and more frequent inr monitoring.    Past Medical History  Diagnosis Date  . Hypertension   . Hyperlipidemia   . Thyroid disease     Hypothyroidism  . GERD (gastroesophageal reflux disease)   . Arthritis     Degenerative  . Peripheral vascular disease     Left   Fem-Pop and  tibial Thrombectomy  . High platelet count   . Dementia   . Pain in toe of right foot   . Hypothyroidism     Past Surgical History  Procedure Laterality Date  . Femoral-popliteal bypass graft  2002    Left  Dr. Sherren Mocha Early  . Abdominal hysterectomy    . Cataract surgery      left eye    VITAL SIGNS BP 150/89  Pulse 74  Ht 5\' 3"  (1.6 m)  Wt 133 lb (60.328 kg)  BMI 23.57 kg/m2   Patient's Medications  New Prescriptions   No medications on file  Previous Medications   ATORVASTATIN (LIPITOR) 10 MG TABLET    Take 10 mg by mouth daily.   CALCIUM-VITAMIN D (OSCAL WITH D) 500-200 MG-UNIT PER TABLET    Take 1 tablet by mouth 2 (two) times daily.   CRANBERRY 125 MG TABS    Take 250 mg by mouth daily.   DONEPEZIL (ARICEPT) 10 MG TABLET    Take 10 mg by mouth daily.    HYDROXYUREA (HYDREA) 500 MG CAPSULE    Take 1,000 mg by mouth daily. May take with food to minimize GI side effects.   LEVOTHYROXINE (SYNTHROID, LEVOTHROID) 100 MCG TABLET    Take 1 tablet (100 mcg total) by mouth daily.   LOSARTAN (COZAAR) 50 MG TABLET    Take 1.5 tablets (75 mg total) by mouth daily.   MEMANTINE (NAMENDA) 5 MG TABLET    Take 1  tablet (5 mg total) by mouth 2 (two) times daily.   MULTIPLE VITAMINS-MINERALS (MULTI COMPLETE PO)    Take 1 tablet by mouth daily.   OMEGA-3 FATTY ACIDS (FISH OIL) 1000 MG CAPS    Take 1 capsule (1,000 mg total) by mouth daily.   PANTOPRAZOLE (PROTONIX) 40 MG TABLET    Take 40 mg by mouth daily.   WARFARIN (COUMADIN) 2.5 MG TABLET    Take 2.5 mg by mouth daily. Take with 4 mg to equal 6.5 mg   WARFARIN (COUMADIN) 4 MG TABLET    Take 4 mg by mouth daily. Take with 2.5 mg to equal a total of 6.5 mg  Modified Medications   No medications on file  Discontinued Medications   FEEDING SUPPLEMENT, ENSURE COMPLETE, (ENSURE COMPLETE) LIQD    Take 237 mLs by mouth daily.    SIGNIFICANT DIAGNOSTIC EXAMS  08-20-13: mri of brain Moderate motion artifact.  No evidence of acute infarct.  09-16-13: chest x-ray: Stable cardiomegaly and pulmonary venous congestion. Mild infiltrates developing in the lung bases cannot be  excluded. This could be secondary to mild pulmonary edema versus pneumonia.  01-22-14: ct of head: 1. No acute intracranial abnormality.2. Atrophy and chronic microvascular white matter ischemic changes.  01-22-14: chest x-ray: No acute abnormality.  Chronic cardiomegaly.   LABS REVIEWED:  07-05-13: tsh 0.032; vit b12: 913; folate >20 09-16-13: wbc 6.1; hgb 14.1; hct 41.6; mcv 112.1; plt 731; glucose 168; bun 20; creat 0.78; k+4.0; na++143; t protein 8.6; albumin 3.7; BNP 619.3 Urine culture: p mirabilis:  09-18-13; wbc 5.6; hgb 12.8; hct 37.7; mcv 109.9; plt 576; glucose 98; bun 16; creat 0.78; k+3.7; na++139  10-28-13: wbc 4.1; hgb 10.8; hct 33.8; mcv 104; plt 162; glucose 80; bun 24; creat 1.0; k+4.1; na++142; liver normal albumin 3.5  01-01-14: glucose 83; bun 21; creat 0.8; k+3.9; na++143  01-03-14: urine culture: proteus mirabilis: augmentin 01-16-14: chol 137; ldl 71; trig 81; tsh 55.354  01-21-14: wbc 5.0; hgb 12.9; hct 40.7; mcv 112; plt 855; glucose 85; bun 16; creat 0.81; k+3.9; na++146  liver normal albumin 4.0; hgb a1c 5.7; tsh 34.630; thiamine 161.5; RPR: neg 01-22-14: wbc 4.7; hgb 12.6; hct 39.5; mcv 113.2; plt 794; glucose 106; bun 16; creat 0.78; k+3.5; na++146; tsh 43.420; free T3: 1.7; free T4: 0.86 01-26-14: urine culture: klebsiella pneumoniae treat with levaquin      Review of Systems  Unable to perform ROS    Physical Exam  Constitutional: No distress.  thin  Neck: Neck supple. No JVD present.  Cardiovascular: Normal rate and intact distal pulses.  Heart rate regular  Respiratory: Effort normal and breath sounds normal. No respiratory distress. She has no wheezes.  GI: Soft. Bowel sounds are normal. She exhibits no distension. There is no tenderness.  Musculoskeletal: She exhibits no edema  Is able to move all extremities; ambulates with staff   Neurological: She is alert.  Skin: Skin is warm and dry. She is not diaphoretic.       ASSESSMENT/ PLAN:  1. UTI: will begin levaquin 500 mg daily for 10 days with florastor twice daily for 10 days will continue her daily cranberry and will monitor her status.   2. Embolism/thrombosis/inr management: she is on long term coumadin therapy. For her inr of 3.2 will hold her coumadin today will then begin 6 mg daily in light of her bing on abt will check her inr 3 days per week and will monitor her status.      [name] NP [facility]  Contact [phone] Monday through Friday 8am- 5pm  After hours call [phone]

## 2014-01-28 ENCOUNTER — Other Ambulatory Visit: Payer: Medicare Other

## 2014-01-28 NOTE — Telephone Encounter (Signed)
Per EPIC Note from 6/1, patient is being treated with Levaquin which is sensitive.

## 2014-02-14 LAB — TSH: TSH: 17.4 u[IU]/mL — AB (ref 0.41–5.90)

## 2014-02-17 ENCOUNTER — Non-Acute Institutional Stay (SKILLED_NURSING_FACILITY): Payer: Medicare Other | Admitting: Adult Health

## 2014-02-17 DIAGNOSIS — F0390 Unspecified dementia without behavioral disturbance: Secondary | ICD-10-CM

## 2014-02-17 DIAGNOSIS — I10 Essential (primary) hypertension: Secondary | ICD-10-CM

## 2014-02-17 DIAGNOSIS — D473 Essential (hemorrhagic) thrombocythemia: Secondary | ICD-10-CM

## 2014-02-17 DIAGNOSIS — I749 Embolism and thrombosis of unspecified artery: Secondary | ICD-10-CM

## 2014-02-17 DIAGNOSIS — E038 Other specified hypothyroidism: Secondary | ICD-10-CM

## 2014-02-17 DIAGNOSIS — Z7901 Long term (current) use of anticoagulants: Secondary | ICD-10-CM

## 2014-02-17 DIAGNOSIS — K219 Gastro-esophageal reflux disease without esophagitis: Secondary | ICD-10-CM

## 2014-02-17 DIAGNOSIS — E785 Hyperlipidemia, unspecified: Secondary | ICD-10-CM

## 2014-02-25 ENCOUNTER — Encounter: Payer: Self-pay | Admitting: Adult Health

## 2014-02-25 MED ORDER — LEVOTHYROXINE SODIUM 112 MCG PO TABS: 112.0000 ug | ORAL_TABLET | Freq: Every day | ORAL | Status: DC

## 2014-02-25 NOTE — Progress Notes (Signed)
Patient ID: Tanya Farmer, female   DOB: February 28, 1926, 80 y.o.   MRN: [id number]     [facility]  No Known Allergies   Chief Complaint  Patient presents with  . Medical Management of Chronic Issues    HPI:  She is being seen for the management of her chronic illnesses. Overall she is without significant change. Her tsh is slowly improving but will continue to require adjustment of her synthroid dose. Her inr today is 2.5 and she is taking coumadin 5 mg daily. There are no concerns being voiced by the nursing staff at this time. She is unable to fully participate in the hpi or ros.   Past Medical History  Diagnosis Date  . Hypertension   . Hyperlipidemia   . Thyroid disease     Hypothyroidism  . GERD (gastroesophageal reflux disease)   . Arthritis     Degenerative  . Peripheral vascular disease     Left   Fem-Pop and  tibial Thrombectomy  . High platelet count   . Dementia   . Pain in toe of right foot   . Hypothyroidism     Past Surgical History  Procedure Laterality Date  . Femoral-popliteal bypass graft  2002    Left  Dr. Sherren Mocha Early  . Abdominal hysterectomy    . Cataract surgery      left eye    VITAL SIGNS BP 129/74  Pulse 70  Ht 5' (1.524 m)  Wt 134 lb 6.4 oz (60.963 kg)  BMI 26.25 kg/m2   Patient's Medications  New Prescriptions   No medications on file  Previous Medications   ATORVASTATIN (LIPITOR) 10 MG TABLET    Take 10 mg by mouth daily.   CALCIUM-VITAMIN D (OSCAL WITH D) 500-200 MG-UNIT PER TABLET    Take 1 tablet by mouth 2 (two) times daily.   CRANBERRY 125 MG TABS    Take 250 mg by mouth daily.   DONEPEZIL (ARICEPT) 10 MG TABLET    Take 10 mg by mouth daily.    HYDROXYUREA (HYDREA) 500 MG CAPSULE    Take 1,000 mg by mouth daily. May take with food to minimize GI side effects.   LEVOTHYROXINE (SYNTHROID, LEVOTHROID) 100 MCG TABLET    Take 1 tablet (100 mcg total) by mouth daily.   LOSARTAN (COZAAR) 50 MG TABLET    Take 1.5 tablets (75 mg total) by  mouth daily.   MEMANTINE (NAMENDA) 5 MG TABLET    Take 1 tablet (5 mg total) by mouth 2 (two) times daily.   MULTIPLE VITAMINS-MINERALS (MULTI COMPLETE PO)    Take 1 tablet by mouth daily.   OMEGA-3 FATTY ACIDS (FISH OIL) 1000 MG CAPS    Take 1 capsule (1,000 mg total) by mouth daily.   PANTOPRAZOLE (PROTONIX) 40 MG TABLET    Take 40 mg by mouth daily.   WARFARIN (COUMADIN) 2.5 MG TABLET    Take 2.5 mg by mouth daily. Take with 4 mg to equal 6.5 mg   WARFARIN (COUMADIN) 4 MG TABLET    Take 5 mg by mouth daily.   Modified Medications   No medications on file  Discontinued Medications   No medications on file    SIGNIFICANT DIAGNOSTIC EXAMS   08-20-13: mri of brain Moderate motion artifact.  No evidence of acute infarct.  09-16-13: chest x-ray: Stable cardiomegaly and pulmonary venous congestion. Mild infiltrates developing in the lung bases cannot be excluded. This could be secondary to mild pulmonary edema versus  pneumonia.  01-22-14: ct of head: 1. No acute intracranial abnormality.2. Atrophy and chronic microvascular white matter ischemic changes.  01-22-14: chest x-ray: No acute abnormality.  Chronic cardiomegaly.   LABS REVIEWED:  07-05-13: tsh 0.032; vit b12: 913; folate >20 09-16-13: wbc 6.1; hgb 14.1; hct 41.6; mcv 112.1; plt 731; glucose 168; bun 20; creat 0.78; k+4.0; na++143; t protein 8.6; albumin 3.7; BNP 619.3 Urine culture: p mirabilis:  09-18-13; wbc 5.6; hgb 12.8; hct 37.7; mcv 109.9; plt 576; glucose 98; bun 16; creat 0.78; k+3.7; na++139  10-28-13: wbc 4.1; hgb 10.8; hct 33.8; mcv 104; plt 162; glucose 80; bun 24; creat 1.0; k+4.1; na++142; liver normal albumin 3.5  01-01-14: glucose 83; bun 21; creat 0.8; k+3.9; na++143  01-03-14: urine culture: proteus mirabilis: augmentin 01-16-14: chol 137; ldl 71; trig 81; tsh 55.354  01-21-14: wbc 5.0; hgb 12.9; hct 40.7; mcv 112; plt 855; glucose 85; bun 16; creat 0.81; k+3.9; na++146 liver normal albumin 4.0; hgb a1c 5.7; tsh 34.630;  thiamine 161.5; RPR: neg 01-22-14: wbc 4.7; hgb 12.6; hct 39.5; mcv 113.2; plt 794; glucose 106; bun 16; creat 0.78; k+3.5; na++146; tsh 43.420; free T3: 1.7; free T4: 0.86 01-26-14: urine culture: klebsiella pneumoniae treat with levaquin  02-14-14: tsh 17.397      Review of Systems  Unable to perform ROS    Physical Exam  Constitutional: No distress.  thin  Neck: Neck supple. No JVD present.  Cardiovascular: Normal rate and intact distal pulses.  Heart rate regular  Respiratory: Effort normal and breath sounds normal. No respiratory distress. She has no wheezes.  GI: Soft. Bowel sounds are normal. She exhibits no distension. There is no tenderness.  Musculoskeletal: She exhibits no edema  Is able to move all extremities; ambulates with staff   Neurological: She is alert.  Skin: Skin is warm and dry. She is not diaphoretic.        ASSESSMENT/ PLAN:  1. Embolism/anticoagulation management: will for her inr of 2.5; will continue her coumadin 5 mg daily and will check inr in two weeks and will monitor her status   2. Essential thrombocytosis: she is stable will continue hydrea 1 gm daily; will monitor  3. Hypothyroidism; is slowly improving; will increase her synthroid to 112 mcg daily in 6 weeks will check tsh; free t3 and free t4.   4. Hypertension: will continue  cozaar to 75 mg daily and will continue to monitor her status   5. Dyslipidemia: will continue lipitor 10 mg daily   6. Gerd: will continue protonix 40 mg daily   7. Insomnia: is stable will continue trazodone 25 mg nightly  8. Dementia: will continue her aricept 10 mg daily and namenda 5 mg twice daily and will monitor her status.   9. Constipation: will continue senna s twice daily         [name] NP [facility]  Contact [phone] Monday through Friday 8am- 5pm  After hours call [phone]

## 2014-03-05 ENCOUNTER — Non-Acute Institutional Stay (SKILLED_NURSING_FACILITY): Payer: Medicare Other | Admitting: Adult Health

## 2014-03-05 DIAGNOSIS — I749 Embolism and thrombosis of unspecified artery: Secondary | ICD-10-CM

## 2014-03-05 DIAGNOSIS — Z7901 Long term (current) use of anticoagulants: Secondary | ICD-10-CM

## 2014-03-10 ENCOUNTER — Non-Acute Institutional Stay (SKILLED_NURSING_FACILITY): Payer: Medicare Other | Admitting: Adult Health

## 2014-03-10 DIAGNOSIS — I749 Embolism and thrombosis of unspecified artery: Secondary | ICD-10-CM

## 2014-03-10 DIAGNOSIS — D473 Essential (hemorrhagic) thrombocythemia: Secondary | ICD-10-CM

## 2014-03-10 DIAGNOSIS — Z7901 Long term (current) use of anticoagulants: Secondary | ICD-10-CM

## 2014-03-13 ENCOUNTER — Encounter: Payer: Self-pay | Admitting: Internal Medicine

## 2014-03-13 ENCOUNTER — Non-Acute Institutional Stay (SKILLED_NURSING_FACILITY): Payer: Medicare Other | Admitting: Internal Medicine

## 2014-03-13 DIAGNOSIS — I749 Embolism and thrombosis of unspecified artery: Secondary | ICD-10-CM

## 2014-03-13 DIAGNOSIS — I10 Essential (primary) hypertension: Secondary | ICD-10-CM

## 2014-03-13 DIAGNOSIS — K219 Gastro-esophageal reflux disease without esophagitis: Secondary | ICD-10-CM

## 2014-03-13 DIAGNOSIS — E039 Hypothyroidism, unspecified: Secondary | ICD-10-CM

## 2014-03-13 DIAGNOSIS — F0390 Unspecified dementia without behavioral disturbance: Secondary | ICD-10-CM

## 2014-03-13 DIAGNOSIS — Z7901 Long term (current) use of anticoagulants: Secondary | ICD-10-CM | POA: Insufficient documentation

## 2014-03-13 HISTORY — DX: Long term (current) use of anticoagulants: Z79.01

## 2014-03-13 NOTE — Progress Notes (Signed)
Patient ID: Tanya Farmer, female   DOB: 1926/08/13, 80 y.o.   MRN: [id number]    Facility: [facility]   Chief Complaint  Patient presents with  . Medical Management of Chronic Issues    routine visit   No Known Allergies  HPI 80 y/o female patient is seen today for routine visit. She has dementia and denies any concerns. No new concern from staff. No new behavioral issue. No falls reported. She has history of pulmonary embolism and is on coumadin. She also has hypothyroidism and HTN. She is unable to fully participate in the hpi or ros.   ROS Unable to obtain  Past Medical History  Diagnosis Date  . Hypertension   . Hyperlipidemia   . Thyroid disease     Hypothyroidism  . GERD (gastroesophageal reflux disease)   . Arthritis     Degenerative  . Peripheral vascular disease     Left   Fem-Pop and  tibial Thrombectomy  . High platelet count   . Dementia   . Pain in toe of right foot   . Hypothyroidism    Current Outpatient Prescriptions on File Prior to Visit  Medication Sig Dispense Refill  . atorvastatin (LIPITOR) 10 MG tablet Take 10 mg by mouth daily.      . calcium-vitamin D (OSCAL WITH D) 500-200 MG-UNIT per tablet Take 1 tablet by mouth 2 (two) times daily.      . Cranberry 125 MG TABS Take 250 mg by mouth daily.      Marland Kitchen donepezil (ARICEPT) 10 MG tablet Take 10 mg by mouth daily.       . hydroxyurea (HYDREA) 500 MG capsule Take 1,000 mg by mouth daily. May take with food to minimize GI side effects.      Marland Kitchen levothyroxine (SYNTHROID, LEVOTHROID) 112 MCG tablet Take 1 tablet (112 mcg total) by mouth daily.  90 tablet  11  . losartan (COZAAR) 50 MG tablet Take 1.5 tablets (75 mg total) by mouth daily.  30 tablet  11  . memantine (NAMENDA) 5 MG tablet Take 1 tablet (5 mg total) by mouth 2 (two) times daily.  60 tablet  11  . Multiple Vitamins-Minerals (MULTI COMPLETE PO) Take 1 tablet by mouth daily.      . Omega-3 Fatty Acids (FISH OIL) 1000 MG CAPS  Take 1 capsule (1,000 mg total) by mouth daily.  30 capsule  11  . pantoprazole (PROTONIX) 40 MG tablet Take 40 mg by mouth daily.      Marland Kitchen warfarin (COUMADIN) 2.5 MG tablet Take 2.5 mg by mouth daily. Take with 4 mg to equal 6.5 mg      . warfarin (COUMADIN) 4 MG tablet Take 5 mg by mouth daily.        No current facility-administered medications on file prior to visit.   Physical exam BP 137/76  Pulse 77  Temp(Src) 99 F (37.2 C)  Resp 19  SpO2 97%  Constitutional: elderly female patient in no distress.  Neck: Neck supple. No thyromegaly. No JVD present.  Cardiovascular: Normal rate, regular heart rate and intact distal pulses.   Respiratory: Effort normal and breath sounds normal. No respiratory distress. She has no wheezes.  GI: Soft. Bowel sounds are normal. She exhibits no distension. There is no tenderness.  Musculoskeletal: She exhibits no edema. Ambulates with staff. Neurological: She is alert.  Skin: Skin is warm and dry. She is not diaphoretic.   Labs: 07-05-13: tsh 0.032; vit b12:  913; folate >20 09-16-13: wbc 6.1; hgb 14.1; hct 41.6; mcv 112.1; plt 731; glucose 168; bun 20; creat 0.78; k+4.0; na++143; t protein 8.6; albumin 3.7; BNP 619.3 Urine culture: p mirabilis:   09-18-13; wbc 5.6; hgb 12.8; hct 37.7; mcv 109.9; plt 576; glucose 98; bun 16; creat 0.78; k+3.7; na++139   10-28-13: wbc 4.1; hgb 10.8; hct 33.8; mcv 104; plt 162; glucose 80; bun 24; creat 1.0; k+4.1; na++142; liver normal albumin 3.5   01-01-14: glucose 83; bun 21; creat 0.8; k+3.9; na++143   01-03-14: urine culture: proteus mirabilis: augmentin 01-16-14: chol 137; ldl 71; trig 81; tsh 55.354   01-21-14: wbc 5.0; hgb 12.9; hct 40.7; mcv 112; plt 855; glucose 85; bun 16; creat 0.81; k+3.9; na++146 liver normal albumin 4.0; hgb a1c 5.7; tsh 34.630; thiamine 161.5; RPR: neg 01-22-14: wbc 4.7; hgb 12.6; hct 39.5; mcv 113.2; plt 794; glucose 106; bun 16; creat 0.78; k+3.5; na++146; tsh 43.420; free T3: 1.7; free T4:  0.86 01-26-14: urine culture: klebsiella pneumoniae treat with levaquin   02-14-14: tsh 17.397   Assessment/plan  Hypertension stable on cozaar 75 mg daily. Monitor bp. Continue lipitor for her cholesterol  Dementia Stable, bp controlled, on statin. Decline anticipated. On aricept 10 mg daily and namenda 5 mg bid.  Assistance with ADLS for now with fall precautions and skin care  Hypothyroidism continue synthroid 125 mcg daily, has repeat thyroid panel  Gerd Stable. Decrease protonix to 20 mg daily   hypercoaguable state continue anticoagulation therapy with coumadin, goal inr 2-3 with history of embolism. Also on hydroxurea  Long term anticoagulation Goal inr 2-3. Continue coumadin  Labs- thyroid panel, yearly lipid

## 2014-03-17 ENCOUNTER — Non-Acute Institutional Stay (SKILLED_NURSING_FACILITY): Payer: Medicare Other | Admitting: Adult Health

## 2014-03-17 DIAGNOSIS — Z7901 Long term (current) use of anticoagulants: Secondary | ICD-10-CM

## 2014-03-17 DIAGNOSIS — I749 Embolism and thrombosis of unspecified artery: Secondary | ICD-10-CM

## 2014-03-17 DIAGNOSIS — D473 Essential (hemorrhagic) thrombocythemia: Secondary | ICD-10-CM

## 2014-03-20 NOTE — Progress Notes (Signed)
Patient ID: Tanya Farmer, female   DOB: July 11, 1926, 80 y.o.   MRN: [id number]     [facility]  No Known Allergies   Chief Complaint  Patient presents with  . Acute Visit    coumadin management     HPI:  She is being seen for the management of her coumadin therapy. She required to have her coumadin held for 2 days her inr today is 1.9 her previous dose was 5 mg daily. There are no concerns being voiced by the nursing staff at this time.    Past Medical History  Diagnosis Date  . Hypertension   . Hyperlipidemia   . Thyroid disease     Hypothyroidism  . GERD (gastroesophageal reflux disease)   . Arthritis     Degenerative  . Peripheral vascular disease     Left   Fem-Pop and  tibial Thrombectomy  . High platelet count   . Dementia   . Pain in toe of right foot   . Hypothyroidism     Past Surgical History  Procedure Laterality Date  . Femoral-popliteal bypass graft  2002    Left  Dr. Sherren Mocha Early  . Abdominal hysterectomy    . Cataract surgery      left eye    VITAL SIGNS BP 132/80  Pulse 76  Ht 5' (1.524 m)  Wt 132 lb 12.8 oz (60.238 kg)  BMI 25.94 kg/m2   Patient's Medications  New Prescriptions   No medications on file  Previous Medications   ATORVASTATIN (LIPITOR) 10 MG TABLET    Take 10 mg by mouth daily.   CALCIUM-VITAMIN D (OSCAL WITH D) 500-200 MG-UNIT PER TABLET    Take 1 tablet by mouth 2 (two) times daily.   CRANBERRY 125 MG TABS    Take 250 mg by mouth daily.   DONEPEZIL (ARICEPT) 10 MG TABLET    Take 10 mg by mouth daily.    HYDROXYUREA (HYDREA) 500 MG CAPSULE    Take 1,000 mg by mouth daily. May take with food to minimize GI side effects.   LEVOTHYROXINE (SYNTHROID, LEVOTHROID) 112 MCG TABLET    Take 1 tablet (112 mcg total) by mouth daily.   LOSARTAN (COZAAR) 50 MG TABLET    Take 1.5 tablets (75 mg total) by mouth daily.   MEMANTINE (NAMENDA) 5 MG TABLET    Take 1 tablet (5 mg total) by mouth 2 (two) times daily.   MULTIPLE VITAMINS-MINERALS  (MULTI COMPLETE PO)    Take 1 tablet by mouth daily.   OMEGA-3 FATTY ACIDS (FISH OIL) 1000 MG CAPS    Take 1 capsule (1,000 mg total) by mouth daily.   PANTOPRAZOLE (PROTONIX) 40 MG TABLET    Take 40 mg by mouth daily.   WARFARIN (COUMADIN) 2.5 MG TABLET    Take 2.5 mg by mouth daily. Take with 4 mg to equal 6.5 mg   WARFARIN (COUMADIN) 4 MG TABLET    Take 5 mg by mouth daily.   Modified Medications   No medications on file  Discontinued Medications   No medications on file    SIGNIFICANT DIAGNOSTIC EXAMS  08-20-13: mri of brain Moderate motion artifact.  No evidence of acute infarct.  09-16-13: chest x-ray: Stable cardiomegaly and pulmonary venous congestion. Mild infiltrates developing in the lung bases cannot be excluded. This could be secondary to mild pulmonary edema versus pneumonia.  01-22-14: ct of head: 1. No acute intracranial abnormality.2. Atrophy and chronic microvascular white matter ischemic changes.  01-22-14: chest x-ray: No acute abnormality.  Chronic cardiomegaly.   LABS REVIEWED:  07-05-13: tsh 0.032; vit b12: 913; folate >20 09-16-13: wbc 6.1; hgb 14.1; hct 41.6; mcv 112.1; plt 731; glucose 168; bun 20; creat 0.78; k+4.0; na++143; t protein 8.6; albumin 3.7; BNP 619.3 Urine culture: p mirabilis:  09-18-13; wbc 5.6; hgb 12.8; hct 37.7; mcv 109.9; plt 576; glucose 98; bun 16; creat 0.78; k+3.7; na++139  10-28-13: wbc 4.1; hgb 10.8; hct 33.8; mcv 104; plt 162; glucose 80; bun 24; creat 1.0; k+4.1; na++142; liver normal albumin 3.5  01-01-14: glucose 83; bun 21; creat 0.8; k+3.9; na++143  01-03-14: urine culture: proteus mirabilis: augmentin 01-16-14: chol 137; ldl 71; trig 81; tsh 55.354  01-21-14: wbc 5.0; hgb 12.9; hct 40.7; mcv 112; plt 855; glucose 85; bun 16; creat 0.81; k+3.9; na++146 liver normal albumin 4.0; hgb a1c 5.7; tsh 34.630; thiamine 161.5; RPR: neg 01-22-14: wbc 4.7; hgb 12.6; hct 39.5; mcv 113.2; plt 794; glucose 106; bun 16; creat 0.78; k+3.5; na++146; tsh  43.420; free T3: 1.7; free T4: 0.86 01-26-14: urine culture: klebsiella pneumoniae treat with levaquin  02-14-14: tsh 17.397      Review of Systems  Unable to perform ROS    Physical Exam  Constitutional: No distress.  thin  Neck: Neck supple. No JVD present.  Cardiovascular: Normal rate and intact distal pulses.  Heart rate regular  Respiratory: Effort normal and breath sounds normal. No respiratory distress. She has no wheezes.  GI: Soft. Bowel sounds are normal. She exhibits no distension. There is no tenderness.  Musculoskeletal: She exhibits no edema  Is able to move all extremities; ambulates with staff   Neurological: She is alert.  Skin: Skin is warm and dry. She is not diaphoretic.        ASSESSMENT/ PLAN:  1. Embolism/anticoagulation management: will for her inr of 1.9 will begin coumadin 5 mg daily and will check inr on Monday and will monitor her status.

## 2014-03-24 ENCOUNTER — Non-Acute Institutional Stay (SKILLED_NURSING_FACILITY): Payer: Medicare Other | Admitting: Adult Health

## 2014-03-24 DIAGNOSIS — D473 Essential (hemorrhagic) thrombocythemia: Secondary | ICD-10-CM

## 2014-03-24 DIAGNOSIS — Z7901 Long term (current) use of anticoagulants: Secondary | ICD-10-CM

## 2014-03-24 DIAGNOSIS — I749 Embolism and thrombosis of unspecified artery: Secondary | ICD-10-CM

## 2014-03-31 ENCOUNTER — Non-Acute Institutional Stay (SKILLED_NURSING_FACILITY): Payer: Medicare Other | Admitting: Adult Health

## 2014-03-31 DIAGNOSIS — Z7901 Long term (current) use of anticoagulants: Secondary | ICD-10-CM

## 2014-03-31 DIAGNOSIS — I749 Embolism and thrombosis of unspecified artery: Secondary | ICD-10-CM

## 2014-03-31 DIAGNOSIS — D473 Essential (hemorrhagic) thrombocythemia: Secondary | ICD-10-CM

## 2014-03-31 LAB — TSH: TSH: 35.25 u[IU]/mL — AB (ref 0.41–5.90)

## 2014-04-01 ENCOUNTER — Non-Acute Institutional Stay (SKILLED_NURSING_FACILITY): Payer: Medicare Other | Admitting: Adult Health

## 2014-04-01 DIAGNOSIS — E039 Hypothyroidism, unspecified: Secondary | ICD-10-CM

## 2014-04-10 ENCOUNTER — Encounter: Payer: Self-pay | Admitting: Adult Health

## 2014-04-10 ENCOUNTER — Non-Acute Institutional Stay (SKILLED_NURSING_FACILITY): Payer: Medicare Other | Admitting: Adult Health

## 2014-04-10 DIAGNOSIS — I749 Embolism and thrombosis of unspecified artery: Secondary | ICD-10-CM

## 2014-04-10 DIAGNOSIS — E785 Hyperlipidemia, unspecified: Secondary | ICD-10-CM

## 2014-04-10 DIAGNOSIS — E038 Other specified hypothyroidism: Secondary | ICD-10-CM

## 2014-04-10 DIAGNOSIS — F0390 Unspecified dementia without behavioral disturbance: Secondary | ICD-10-CM

## 2014-04-10 DIAGNOSIS — D473 Essential (hemorrhagic) thrombocythemia: Secondary | ICD-10-CM

## 2014-04-10 DIAGNOSIS — K5909 Other constipation: Secondary | ICD-10-CM

## 2014-04-10 DIAGNOSIS — I10 Essential (primary) hypertension: Secondary | ICD-10-CM

## 2014-04-10 MED ORDER — LEVOTHYROXINE SODIUM 150 MCG PO TABS: 150.0000 ug | ORAL_TABLET | Freq: Every day | ORAL | Status: DC

## 2014-04-10 NOTE — Progress Notes (Signed)
Patient ID: Tanya Farmer, female   DOB: [birth year], 80 y.o.   MRN: [id number]     [facility]  No Known Allergies   Chief Complaint  Patient presents with  . Acute Visit    follow up lab results     HPI:  No problem-specific assessment & plan notes found for this encounter.   Past Medical History  Diagnosis Date  . Hypertension   . Hyperlipidemia   . Thyroid disease     Hypothyroidism  . GERD (gastroesophageal reflux disease)   . Arthritis     Degenerative  . Peripheral vascular disease     Left   Fem-Pop and  tibial Thrombectomy  . High platelet count   . Dementia   . Pain in toe of right foot   . Hypothyroidism     Past Surgical History  Procedure Laterality Date  . Femoral-popliteal bypass graft  2002    Left  Dr. Sherren Mocha Early  . Abdominal hysterectomy    . Cataract surgery      left eye    VITAL SIGNS BP 126/87  Pulse 60  Ht 5' (1.524 m)  Wt 132 lb (59.875 kg)  BMI 25.78 kg/m2  SpO2 96%   Patient's Medications  New Prescriptions   No medications on file  Previous Medications   ATORVASTATIN (LIPITOR) 10 MG TABLET    Take 10 mg by mouth daily.   CALCIUM-VITAMIN D (OSCAL WITH D) 500-200 MG-UNIT PER TABLET    Take 1 tablet by mouth 2 (two) times daily.   CRANBERRY 125 MG TABS    Take 250 mg by mouth daily.   DONEPEZIL (ARICEPT) 10 MG TABLET    Take 10 mg by mouth daily.    HYDROXYUREA (HYDREA) 500 MG CAPSULE    Take 1,000 mg by mouth daily. May take with food to minimize GI side effects.   LEVOTHYROXINE (SYNTHROID, LEVOTHROID) 112 MCG TABLET    Take 1 tablet (112 mcg total) by mouth daily.   LOSARTAN (COZAAR) 50 MG TABLET    Take 1.5 tablets (75 mg total) by mouth daily.   MEMANTINE (NAMENDA) 5 MG TABLET    Take 1 tablet (5 mg total) by mouth 2 (two) times daily.   MULTIPLE VITAMINS-MINERALS (MULTI COMPLETE PO)    Take 1 tablet by mouth daily.   OMEGA-3 FATTY ACIDS (FISH OIL) 1000 MG CAPS    Take 1 capsule (1,000 mg total) by mouth daily.   PANTOPRAZOLE  (PROTONIX) 40 MG TABLET    Take 40 mg by mouth daily.   WARFARIN (COUMADIN) 2.5 MG TABLET    Take 2.5 mg by mouth daily. Take with 4 mg to equal 6.5 mg   WARFARIN (COUMADIN) 4 MG TABLET    Take 5 mg by mouth daily.   Modified Medications   No medications on file  Discontinued Medications   No medications on file    SIGNIFICANT DIAGNOSTIC EXAMS  08-20-13: mri of brain Moderate motion artifact.  No evidence of acute infarct.  09-16-13: chest x-ray: Stable cardiomegaly and pulmonary venous congestion. Mild infiltrates developing in the lung bases cannot be excluded. This could be secondary to mild pulmonary edema versus pneumonia.  01-22-14: ct of head: 1. No acute intracranial abnormality.2. Atrophy and chronic microvascular white matter ischemic changes.  01-22-14: chest x-ray: No acute abnormality.  Chronic cardiomegaly.   LABS REVIEWED:  07-05-13: tsh 0.032; vit b12: 913; folate >20 09-16-13: wbc 6.1; hgb 14.1; hct 41.6; mcv 112.1; plt 731; glucose  168; bun 20; creat 0.78; k+4.0; na++143; t protein 8.6; albumin 3.7; BNP 619.3 Urine culture: p mirabilis:  09-18-13; wbc 5.6; hgb 12.8; hct 37.7; mcv 109.9; plt 576; glucose 98; bun 16; creat 0.78; k+3.7; na++139  10-28-13: wbc 4.1; hgb 10.8; hct 33.8; mcv 104; plt 162; glucose 80; bun 24; creat 1.0; k+4.1; na++142; liver normal albumin 3.5  01-01-14: glucose 83; bun 21; creat 0.8; k+3.9; na++143  01-03-14: urine culture: proteus mirabilis: augmentin 01-16-14: chol 137; ldl 71; trig 81; tsh 55.354  01-21-14: wbc 5.0; hgb 12.9; hct 40.7; mcv 112; plt 855; glucose 85; bun 16; creat 0.81; k+3.9; na++146 liver normal albumin 4.0; hgb a1c 5.7; tsh 34.630; thiamine 161.5; RPR: neg 01-22-14: wbc 4.7; hgb 12.6; hct 39.5; mcv 113.2; plt 794; glucose 106; bun 16; creat 0.78; k+3.5; na++146; tsh 43.420; free T3: 1.7; free T4: 0.86 01-26-14: urine culture: klebsiella pneumoniae treat with levaquin  02-14-14: tsh 17.397  03-31-14: tsh 35.249     Review of Systems   Unable to perform ROS    Physical Exam  Constitutional: No distress.  thin  Neck: Neck supple. No JVD present.  Cardiovascular: Normal rate and intact distal pulses.  Heart rate regular  Respiratory: Effort normal and breath sounds normal. No respiratory distress. She has no wheezes.  GI: Soft. Bowel sounds are normal. She exhibits no distension. There is no tenderness.  Musculoskeletal: She exhibits no edema  Is able to move all extremities; ambulates with staff   Neurological: She is alert.  Skin: Skin is warm and dry. She is not diaphoretic.        ASSESSMENT/ PLAN:  1. Hypothyroidism: is worse; will increase her synthroid to 150 mcg daily in 4 weeks will check tsh free t4; free t3 and will monitor her status.

## 2014-04-21 ENCOUNTER — Non-Acute Institutional Stay (SKILLED_NURSING_FACILITY): Payer: Medicare Other | Admitting: Adult Health

## 2014-04-21 DIAGNOSIS — Z7901 Long term (current) use of anticoagulants: Secondary | ICD-10-CM

## 2014-04-21 DIAGNOSIS — I749 Embolism and thrombosis of unspecified artery: Secondary | ICD-10-CM

## 2014-04-21 DIAGNOSIS — D473 Essential (hemorrhagic) thrombocythemia: Secondary | ICD-10-CM

## 2014-04-28 ENCOUNTER — Non-Acute Institutional Stay (SKILLED_NURSING_FACILITY): Payer: Medicare Other | Admitting: Adult Health

## 2014-04-28 DIAGNOSIS — Z7901 Long term (current) use of anticoagulants: Secondary | ICD-10-CM

## 2014-04-28 DIAGNOSIS — D473 Essential (hemorrhagic) thrombocythemia: Secondary | ICD-10-CM

## 2014-04-28 DIAGNOSIS — I749 Embolism and thrombosis of unspecified artery: Secondary | ICD-10-CM

## 2014-04-29 NOTE — Progress Notes (Signed)
Patient ID: Tanya Farmer, female   DOB: [birth year], 80 y.o.   MRN: [id number]     [facility]  No Known Allergies   Chief Complaint  Patient presents with  . Medical Management of Chronic Issues    HPI:  She is a long term resident of this facility being seen for the management of her chronic illnesses. She is having increased difficulty with her gait due to her progression in her dementia. She has been seen by therapy for her gait. She is using a wheelchair. She is unable to fully participate in the hpi or ros. There are no concerns being voiced by the nursing staff at this time.    Past Medical History  Diagnosis Date  . Hypertension   . Hyperlipidemia   . Thyroid disease     Hypothyroidism  . GERD (gastroesophageal reflux disease)   . Arthritis     Degenerative  . Peripheral vascular disease     Left   Fem-Pop and  tibial Thrombectomy  . High platelet count   . Dementia   . Pain in toe of right foot   . Hypothyroidism     Past Surgical History  Procedure Laterality Date  . Femoral-popliteal bypass graft  2002    Left  Dr. Sherren Mocha Early  . Abdominal hysterectomy    . Cataract surgery      left eye    VITAL SIGNS BP 126/87  Pulse 60  Ht 5' (1.524 m)  Wt 132 lb (59.875 kg)  BMI 25.78 kg/m2   Patient's Medications  New Prescriptions   No medications on file  Previous Medications   ATORVASTATIN (LIPITOR) 10 MG TABLET    Take 10 mg by mouth daily.   CALCIUM-VITAMIN D (OSCAL WITH D) 500-200 MG-UNIT PER TABLET    Take 1 tablet by mouth 2 (two) times daily.   CRANBERRY 125 MG TABS    Take 250 mg by mouth daily.   DONEPEZIL (ARICEPT) 10 MG TABLET    Take 10 mg by mouth daily.    HYDROXYUREA (HYDREA) 500 MG CAPSULE    Take 1,000 mg by mouth daily. May take with food to minimize GI side effects.   LEVOTHYROXINE (SYNTHROID, LEVOTHROID) 150 MCG TABLET    Take 1 tablet (150 mcg total) by mouth daily.   LOSARTAN (COZAAR) 50 MG TABLET    Take 1.5 tablets (75 mg total) by  mouth daily.   MEMANTINE (NAMENDA) 5 MG TABLET    Take 1 tablet (5 mg total) by mouth 2 (two) times daily.   MULTIPLE VITAMINS-MINERALS (MULTI COMPLETE PO)    Take 1 tablet by mouth daily.   OMEGA-3 FATTY ACIDS (FISH OIL) 1000 MG CAPS    Take 1 capsule (1,000 mg total) by mouth daily.   PANTOPRAZOLE (PROTONIX) 40 MG TABLET    Take 40 mg by mouth daily.   WARFARIN (COUMADIN) 2.5 MG TABLET    Take 2.5 mg by mouth daily. Take with 4 mg to equal 6.5 mg   WARFARIN (COUMADIN) 4 MG TABLET    Take 5 mg by mouth daily.   Modified Medications   No medications on file  Discontinued Medications   No medications on file    SIGNIFICANT DIAGNOSTIC EXAMS   08-20-13: mri of brain Moderate motion artifact.  No evidence of acute infarct.  09-16-13: chest x-ray: Stable cardiomegaly and pulmonary venous congestion. Mild infiltrates developing in the lung bases cannot be excluded. This could be secondary to mild pulmonary edema  versus pneumonia.  01-22-14: ct of head: 1. No acute intracranial abnormality.2. Atrophy and chronic microvascular white matter ischemic changes.  01-22-14: chest x-ray: No acute abnormality.  Chronic cardiomegaly.   LABS REVIEWED:  07-05-13: tsh 0.032; vit b12: 913; folate >20 09-16-13: wbc 6.1; hgb 14.1; hct 41.6; mcv 112.1; plt 731; glucose 168; bun 20; creat 0.78; k+4.0; na++143; t protein 8.6; albumin 3.7; BNP 619.3 Urine culture: p mirabilis:  09-18-13; wbc 5.6; hgb 12.8; hct 37.7; mcv 109.9; plt 576; glucose 98; bun 16; creat 0.78; k+3.7; na++139  10-28-13: wbc 4.1; hgb 10.8; hct 33.8; mcv 104; plt 162; glucose 80; bun 24; creat 1.0; k+4.1; na++142; liver normal albumin 3.5  01-01-14: glucose 83; bun 21; creat 0.8; k+3.9; na++143  01-03-14: urine culture: proteus mirabilis: augmentin 01-16-14: chol 137; ldl 71; trig 81; tsh 55.354  01-21-14: wbc 5.0; hgb 12.9; hct 40.7; mcv 112; plt 855; glucose 85; bun 16; creat 0.81; k+3.9; na++146 liver normal albumin 4.0; hgb a1c 5.7; tsh 34.630;  thiamine 161.5; RPR: neg 01-22-14: wbc 4.7; hgb 12.6; hct 39.5; mcv 113.2; plt 794; glucose 106; bun 16; creat 0.78; k+3.5; na++146; tsh 43.420; free T3: 1.7; free T4: 0.86 01-26-14: urine culture: klebsiella pneumoniae treat with levaquin  02-14-14: tsh 17.397  03-31-14: tsh 35.249     Review of Systems  Unable to perform ROS    Physical Exam  Constitutional: No distress.  thin  Neck: Neck supple. No JVD present.  Cardiovascular: Normal rate and intact distal pulses.  Heart rate regular  Respiratory: Effort normal and breath sounds normal. No respiratory distress. She has no wheezes.  GI: Soft. Bowel sounds are normal. She exhibits no distension. There is no tenderness.  Musculoskeletal: She exhibits no edema  Is able to move all extremities; is mainly in wheelchair at this time.    Neurological: She is alert.  Skin: Skin is warm and dry. She is not diaphoretic.    ASSESSMENT/PLAN  1. Embolism/anticoagulation management: will continue her coumadin 5 mg daily and will check inr in two weeks and will monitor her status  Her inr on  04-07-14 was 2.8   2. Essential thrombocytosis: she is stable will continue hydrea 1 gm daily; will monitor  3. Hypothyroidism; will continue her synthroid at 150 mcg daily; she has had a dose change recently will monitor her status.   4. Hypertension: will continue  cozaar to 75 mg daily and will continue to monitor her status   5. Dyslipidemia: will continue lipitor 10 mg daily and fish oil 1 gm daily   6. Gerd: will continue protonix 40 mg daily   7. Insomnia: is stable is not on medications will monitor   8. Dementia: will continue her aricept 10 mg daily and namenda 5 mg twice daily and will monitor her status.   9. Constipation: will continue senna s twice daily         [name] NP [facility]  Contact [phone] Monday through Friday 8am- 5pm  After hours call [phone]

## 2014-05-02 LAB — TSH: TSH: 1.3 u[IU]/mL (ref <=5.90)

## 2014-05-04 ENCOUNTER — Other Ambulatory Visit: Payer: Self-pay

## 2014-05-04 LAB — URINALYSIS, COMPLETE
Bilirubin,UR: NEGATIVE
Blood: NEGATIVE
Glucose,UR: NEGATIVE mg/dL
Ketone: NEGATIVE
Nitrite: POSITIVE
Ph: 5
Protein: 30
RBC,UR: 45 /HPF
Specific Gravity: 1.02
Squamous Epithelial: 3
Transitional Epi: 1
WBC UR: 1905 /HPF

## 2014-05-06 ENCOUNTER — Non-Acute Institutional Stay (SKILLED_NURSING_FACILITY): Payer: Medicare Other | Admitting: Adult Health

## 2014-05-06 DIAGNOSIS — I749 Embolism and thrombosis of unspecified artery: Secondary | ICD-10-CM

## 2014-05-06 DIAGNOSIS — D473 Essential (hemorrhagic) thrombocythemia: Secondary | ICD-10-CM

## 2014-05-06 DIAGNOSIS — Z7901 Long term (current) use of anticoagulants: Secondary | ICD-10-CM

## 2014-05-06 LAB — URINE CULTURE

## 2014-05-08 ENCOUNTER — Non-Acute Institutional Stay (SKILLED_NURSING_FACILITY): Payer: Medicare Other | Admitting: Adult Health

## 2014-05-08 DIAGNOSIS — E785 Hyperlipidemia, unspecified: Secondary | ICD-10-CM

## 2014-05-08 DIAGNOSIS — D473 Essential (hemorrhagic) thrombocythemia: Secondary | ICD-10-CM

## 2014-05-08 DIAGNOSIS — E038 Other specified hypothyroidism: Secondary | ICD-10-CM

## 2014-05-08 DIAGNOSIS — I749 Embolism and thrombosis of unspecified artery: Secondary | ICD-10-CM

## 2014-05-08 DIAGNOSIS — I10 Essential (primary) hypertension: Secondary | ICD-10-CM

## 2014-05-08 DIAGNOSIS — N390 Urinary tract infection, site not specified: Secondary | ICD-10-CM

## 2014-05-08 DIAGNOSIS — F0390 Unspecified dementia without behavioral disturbance: Secondary | ICD-10-CM

## 2014-05-08 DIAGNOSIS — Z7901 Long term (current) use of anticoagulants: Secondary | ICD-10-CM

## 2014-05-09 ENCOUNTER — Emergency Department (HOSPITAL_COMMUNITY): Payer: Medicare Other

## 2014-05-09 ENCOUNTER — Inpatient Hospital Stay (HOSPITAL_COMMUNITY)
Admission: EM | Admit: 2014-05-09 | Discharge: 2014-05-13 | DRG: 690 | Disposition: A | Discharge status: Skilled Nursing Facility | Payer: Medicare Other | Attending: Internal Medicine | Admitting: Internal Medicine

## 2014-05-09 ENCOUNTER — Encounter (HOSPITAL_COMMUNITY): Payer: Self-pay | Admitting: Emergency Medicine

## 2014-05-09 DIAGNOSIS — N390 Urinary tract infection, site not specified: Principal | ICD-10-CM

## 2014-05-09 DIAGNOSIS — K219 Gastro-esophageal reflux disease without esophagitis: Secondary | ICD-10-CM | POA: Diagnosis present

## 2014-05-09 DIAGNOSIS — R531 Weakness: Secondary | ICD-10-CM

## 2014-05-09 DIAGNOSIS — R410 Disorientation, unspecified: Secondary | ICD-10-CM

## 2014-05-09 DIAGNOSIS — Z86718 Personal history of other venous thrombosis and embolism: Secondary | ICD-10-CM | POA: Diagnosis not present

## 2014-05-09 DIAGNOSIS — R404 Transient alteration of awareness: Secondary | ICD-10-CM | POA: Diagnosis present

## 2014-05-09 DIAGNOSIS — Z7901 Long term (current) use of anticoagulants: Secondary | ICD-10-CM

## 2014-05-09 DIAGNOSIS — R627 Adult failure to thrive: Secondary | ICD-10-CM

## 2014-05-09 DIAGNOSIS — D473 Essential (hemorrhagic) thrombocythemia: Secondary | ICD-10-CM

## 2014-05-09 DIAGNOSIS — I10 Essential (primary) hypertension: Secondary | ICD-10-CM

## 2014-05-09 DIAGNOSIS — I739 Peripheral vascular disease, unspecified: Secondary | ICD-10-CM | POA: Diagnosis present

## 2014-05-09 DIAGNOSIS — E785 Hyperlipidemia, unspecified: Secondary | ICD-10-CM

## 2014-05-09 DIAGNOSIS — E039 Hypothyroidism, unspecified: Secondary | ICD-10-CM | POA: Diagnosis present

## 2014-05-09 DIAGNOSIS — I4891 Unspecified atrial fibrillation: Secondary | ICD-10-CM | POA: Diagnosis not present

## 2014-05-09 DIAGNOSIS — F0390 Unspecified dementia without behavioral disturbance: Secondary | ICD-10-CM

## 2014-05-09 DIAGNOSIS — I749 Embolism and thrombosis of unspecified artery: Secondary | ICD-10-CM

## 2014-05-09 LAB — URINALYSIS, ROUTINE W REFLEX MICROSCOPIC
Bilirubin Urine: NEGATIVE
Glucose, UA: NEGATIVE mg/dL
Hgb urine dipstick: NEGATIVE
Ketones, ur: 15 mg/dL — AB
Nitrite: NEGATIVE
Protein, ur: NEGATIVE mg/dL
Specific Gravity, Urine: 1.024 (ref 1.005–1.030)
Urobilinogen, UA: 0.2 mg/dL (ref 0.0–1.0)
pH: 5.5 (ref 5.0–8.0)

## 2014-05-09 LAB — COMPREHENSIVE METABOLIC PANEL
ALT: 15 U/L (ref 0–35)
AST: 33 U/L (ref 0–37)
Albumin: 3.4 g/dL — ABNORMAL LOW (ref 3.5–5.2)
Alkaline Phosphatase: 65 U/L (ref 39–117)
Anion gap: 13 (ref 5–15)
BUN: 14 mg/dL (ref 6–23)
CO2: 27 mEq/L (ref 19–32)
Calcium: 9.3 mg/dL (ref 8.4–10.5)
Chloride: 103 mEq/L (ref 96–112)
Creatinine, Ser: 0.76 mg/dL (ref 0.50–1.10)
GFR calc Af Amer: 86 mL/min — ABNORMAL LOW (ref >90)
GFR calc non Af Amer: 74 mL/min — ABNORMAL LOW (ref >90)
Glucose, Bld: 108 mg/dL — ABNORMAL HIGH (ref 70–99)
Potassium: 4 mEq/L (ref 3.7–5.3)
Sodium: 143 mEq/L (ref 137–147)
Total Bilirubin: 0.4 mg/dL (ref 0.3–1.2)
Total Protein: 7.6 g/dL (ref 6.0–8.3)

## 2014-05-09 LAB — CBC
HCT: 39.5 % (ref 36.0–46.0)
Hemoglobin: 12.8 g/dL (ref 12.0–15.0)
MCH: 33.7 pg (ref 26.0–34.0)
MCHC: 32.4 g/dL (ref 30.0–36.0)
MCV: 103.9 fL — ABNORMAL HIGH (ref 78.0–100.0)
Platelets: 1016 10*3/uL (ref 150–400)
RBC: 3.8 MIL/uL — ABNORMAL LOW (ref 3.87–5.11)
RDW: 16 % — ABNORMAL HIGH (ref 11.5–15.5)
WBC: 5.6 10*3/uL (ref 4.0–10.5)

## 2014-05-09 LAB — TROPONIN I: Troponin I: <0.3 ng/mL (ref <0.30)

## 2014-05-09 LAB — CBG MONITORING, ED: Glucose-Capillary: 92 mg/dL (ref 70–99)

## 2014-05-09 LAB — URINE MICROSCOPIC-ADD ON

## 2014-05-09 LAB — I-STAT CG4 LACTIC ACID, ED: Lactic Acid, Venous: 1.89 mmol/L (ref 0.5–2.2)

## 2014-05-09 LAB — TSH: TSH: 1.26 u[IU]/mL (ref 0.350–4.500)

## 2014-05-09 LAB — PROTIME-INR
INR: 2.45 — ABNORMAL HIGH (ref 0.00–1.49)
Prothrombin Time: 26.6 seconds — ABNORMAL HIGH (ref 11.6–15.2)

## 2014-05-09 MED ORDER — OMEGA-3-ACID ETHYL ESTERS 1 G PO CAPS
1.0000 g | ORAL_CAPSULE | Freq: Two times a day (BID) | ORAL | Status: DC
  Administered 2014-05-10 – 2014-05-13 (×7): 1 g via ORAL
  Filled 2014-05-09 (×9): qty 1

## 2014-05-09 MED ORDER — POLYETHYLENE GLYCOL 3350 17 G PO PACK
17.0000 g | PACK | Freq: Every day | ORAL | Status: DC | PRN
  Filled 2014-05-09: qty 1

## 2014-05-09 MED ORDER — PANTOPRAZOLE SODIUM 20 MG PO TBEC
20.0000 mg | DELAYED_RELEASE_TABLET | Freq: Every day | ORAL | Status: DC
  Administered 2014-05-10 – 2014-05-13 (×4): 20 mg via ORAL
  Filled 2014-05-09 (×4): qty 1

## 2014-05-09 MED ORDER — DEXTROSE 5 % IV SOLN
1.0000 g | INTRAVENOUS | Status: DC
  Administered 2014-05-10 – 2014-05-12 (×3): 1 g via INTRAVENOUS
  Filled 2014-05-09 (×5): qty 10

## 2014-05-09 MED ORDER — DEXTROSE 5 % IV SOLN
1.0000 g | Freq: Once | INTRAVENOUS | Status: AC
  Administered 2014-05-09: 1 g via INTRAVENOUS
  Filled 2014-05-09: qty 10

## 2014-05-09 MED ORDER — SENNOSIDES-DOCUSATE SODIUM 8.6-50 MG PO TABS
1.0000 | ORAL_TABLET | Freq: Every day | ORAL | Status: DC
  Administered 2014-05-09 – 2014-05-13 (×5): 1 via ORAL
  Filled 2014-05-09 (×5): qty 1

## 2014-05-09 MED ORDER — WARFARIN 0.5 MG HALF TABLET
5.5000 mg | ORAL_TABLET | Freq: Once | ORAL | Status: AC
  Filled 2014-05-09: qty 1

## 2014-05-09 MED ORDER — WARFARIN - PHARMACIST DOSING INPATIENT
Freq: Every day | Status: DC
  Administered 2014-05-12: 18:00:00

## 2014-05-09 MED ORDER — MEMANTINE HCL 5 MG PO TABS
5.0000 mg | ORAL_TABLET | Freq: Two times a day (BID) | ORAL | Status: DC
  Administered 2014-05-09 – 2014-05-13 (×8): 5 mg via ORAL
  Filled 2014-05-09 (×9): qty 1

## 2014-05-09 MED ORDER — CRANBERRY 125 MG PO TABS: 250.0000 mg | ORAL_TABLET | Freq: Every day | ORAL | Status: DC

## 2014-05-09 MED ORDER — SACCHAROMYCES BOULARDII 250 MG PO CAPS
250.0000 mg | ORAL_CAPSULE | Freq: Two times a day (BID) | ORAL | Status: DC
  Administered 2014-05-09 – 2014-05-13 (×8): 250 mg via ORAL
  Filled 2014-05-09 (×9): qty 1

## 2014-05-09 MED ORDER — DONEPEZIL HCL 10 MG PO TABS
10.0000 mg | ORAL_TABLET | Freq: Every day | ORAL | Status: DC
  Administered 2014-05-09 – 2014-05-13 (×5): 10 mg via ORAL
  Filled 2014-05-09 (×5): qty 1

## 2014-05-09 MED ORDER — ACETAMINOPHEN 325 MG PO TABS: 650.0000 mg | ORAL_TABLET | Freq: Four times a day (QID) | ORAL | Status: DC | PRN

## 2014-05-09 MED ORDER — HYDROXYUREA 500 MG PO CAPS
1000.0000 mg | ORAL_CAPSULE | Freq: Every day | ORAL | Status: DC
  Administered 2014-05-10 – 2014-05-13 (×4): 1000 mg via ORAL
  Filled 2014-05-09 (×4): qty 2

## 2014-05-09 MED ORDER — GUAIFENESIN-DM 100-10 MG/5ML PO SYRP: 5.0000 mL | ORAL_SOLUTION | ORAL | Status: DC | PRN

## 2014-05-09 MED ORDER — LOSARTAN POTASSIUM 50 MG PO TABS
75.0000 mg | ORAL_TABLET | Freq: Every day | ORAL | Status: DC
  Administered 2014-05-10 – 2014-05-13 (×3): 75 mg via ORAL
  Filled 2014-05-09 (×4): qty 1

## 2014-05-09 MED ORDER — DILTIAZEM HCL 100 MG IV SOLR
5.0000 mg/h | INTRAVENOUS | Status: DC
  Administered 2014-05-09: 5 mg/h via INTRAVENOUS
  Administered 2014-05-10: 10 mg/h via INTRAVENOUS
  Filled 2014-05-09 (×2): qty 100

## 2014-05-09 MED ORDER — ACETAMINOPHEN 650 MG RE SUPP: 650.0000 mg | Freq: Four times a day (QID) | RECTAL | Status: DC | PRN

## 2014-05-09 MED ORDER — ONDANSETRON HCL 4 MG PO TABS: 4.0000 mg | ORAL_TABLET | Freq: Four times a day (QID) | ORAL | Status: DC | PRN

## 2014-05-09 MED ORDER — SODIUM CHLORIDE 0.9 % IV SOLN
INTRAVENOUS | Status: AC
  Administered 2014-05-09 (×2): via INTRAVENOUS

## 2014-05-09 MED ORDER — AMLODIPINE BESYLATE 10 MG PO TABS: 10.0000 mg | ORAL_TABLET | Freq: Every day | ORAL | Status: DC

## 2014-05-09 MED ORDER — ONDANSETRON HCL 4 MG/2ML IJ SOLN: 4.0000 mg | Freq: Four times a day (QID) | INTRAMUSCULAR | Status: DC | PRN

## 2014-05-09 MED ORDER — HYDRALAZINE HCL 20 MG/ML IJ SOLN
10.0000 mg | Freq: Four times a day (QID) | INTRAMUSCULAR | Status: DC | PRN
  Administered 2014-05-09: 10 mg via INTRAVENOUS
  Filled 2014-05-09: qty 1

## 2014-05-09 MED ORDER — SODIUM CHLORIDE 0.9 % IJ SOLN
3.0000 mL | Freq: Two times a day (BID) | INTRAMUSCULAR | Status: DC
  Administered 2014-05-09 – 2014-05-13 (×8): 3 mL via INTRAVENOUS

## 2014-05-09 MED ORDER — LEVOTHYROXINE SODIUM 150 MCG PO TABS
150.0000 ug | ORAL_TABLET | Freq: Every day | ORAL | Status: DC
  Administered 2014-05-10 – 2014-05-13 (×4): 150 ug via ORAL
  Filled 2014-05-09 (×5): qty 1

## 2014-05-09 MED ORDER — ATORVASTATIN CALCIUM 10 MG PO TABS
10.0000 mg | ORAL_TABLET | Freq: Every day | ORAL | Status: DC
  Administered 2014-05-09 – 2014-05-13 (×5): 10 mg via ORAL
  Filled 2014-05-09 (×5): qty 1

## 2014-05-09 MED ORDER — ASPIRIN 81 MG PO CHEW
81.0000 mg | CHEWABLE_TABLET | Freq: Every day | ORAL | Status: DC
  Administered 2014-05-10 – 2014-05-13 (×4): 81 mg via ORAL
  Filled 2014-05-09 (×4): qty 1

## 2014-05-09 NOTE — Progress Notes (Signed)
Pt arrived from ED at 1904 via stretcher with family at her side.  Transferred pt to bed and oriented to room.  Noted pt not verbalizing using more of hand grip.  Hx of dementia.  HR >130 with movement.  Oncoming nurse aware.

## 2014-05-09 NOTE — H&P (Addendum)
Patient Demographics  Tanya Farmer, is a 80 y.o. female  MRN: [id number]   DOB - 01/10/1926  Admit Date - 05/09/2014  Outpatient Primary MD for the patient is Blanchie Serve, MD   With History of -  Past Medical History  Diagnosis Date  . Hypertension   . Hyperlipidemia   . Thyroid disease     Hypothyroidism  . GERD (gastroesophageal reflux disease)   . Arthritis     Degenerative  . Peripheral vascular disease     Left   Fem-Pop and  tibial Thrombectomy  . High platelet count   . Dementia   . Pain in toe of right foot   . Hypothyroidism       Past Surgical History  Procedure Laterality Date  . Femoral-popliteal bypass graft  2002    Left  Dr. Sherren Mocha Early  . Abdominal hysterectomy    . Cataract surgery      left eye    in for   Chief Complaint  Patient presents with  . Altered Mental Status     HPI  Tanya Farmer  is a 80 y.o. female,  with history of hypertension, dyslipidemia, hypothyroidism, advanced dementia living in a nursing home, left leg DVT several years ago on Coumadin, essential thrombocytosis, GERD, left leg femoropopliteal bypass in 2002, recurrent UTIs according to the family for this year, living in a nursing home was brought in for another episode of UTI and being weaker than her usual self. According to the family is on her leaning to the left side which usually happens when she has a UTI, she did get a UA checked a few days ago and it was consistent with UTI, she was started on Bactrim however according to the family she did not show much improvement and thereafter she was brought in the ER.  In the ER workup consistent with generalized weakness, initial lab work was stable, head CT was nonacute, UA showed partially treated UTI, her exam in the ER was nonfocal consistent with advanced  dementia, house call to admit the patient for UTI with generalized weakness and failure to thrive.    Review of Systems    In addition to the HPI above,   No Fever-chills, No Headache, No changes with Vision or hearing, No problems swallowing food or Liquids, No Chest pain, Cough or Shortness of Breath, No Abdominal pain, No Nausea or Vommitting, Bowel movements are regular, No Blood in stool or Urine, No dysuria, No new skin rashes or bruises, No new joints pains-aches,  No new weakness, tingling, numbness in any extremity, No recent weight gain or loss, No polyuria, polydypsia or polyphagia, No significant Mental Stressors.  A full 10 point Review of Systems was done, except as stated above, all other Review of Systems were negative.   Social History History  Substance Use Topics  . Smoking status: Former Research scientist (life sciences)  . Smokeless tobacco: Never Used  . Alcohol Use: No  Family History Family History  Problem Relation Age of Onset  . Heart failure Mother   . Stroke Father       Prior to Admission medications   Medication Sig Start Date End Date Taking? Authorizing Provider  atorvastatin (LIPITOR) 10 MG tablet Take 10 mg by mouth daily.   Yes Historical Provider, MD  calcium-vitamin D (OSCAL WITH D) 500-200 MG-UNIT per tablet Take 1 tablet by mouth 2 (two) times daily.   Yes Historical Provider, MD  Cranberry 125 MG TABS Take 250 mg by mouth daily. 01/22/14  Yes Kristen N Ward, DO  donepezil (ARICEPT) 10 MG tablet Take 10 mg by mouth daily.    Yes Historical Provider, MD  hydroxyurea (HYDREA) 500 MG capsule Take 1,000 mg by mouth daily. May take with food to minimize GI side effects.   Yes Historical Provider, MD  levothyroxine (SYNTHROID, LEVOTHROID) 150 MCG tablet Take 1 tablet (150 mcg total) by mouth daily. 04/10/14  Yes Gerlene Fee, NP  losartan (COZAAR) 50 MG tablet Take 1.5 tablets (75 mg total) by mouth daily. 12/18/13  Yes Gerlene Fee, NP  memantine  (NAMENDA) 5 MG tablet Take 1 tablet (5 mg total) by mouth 2 (two) times daily. 01/12/14  Yes Gerlene Fee, NP  Multiple Vitamins-Minerals (MULTI COMPLETE PO) Take 1 tablet by mouth daily.   Yes Historical Provider, MD  nitrofurantoin (MACRODANTIN) 100 MG capsule Take 100 mg by mouth 2 (two) times daily. For 1 week   Yes Historical Provider, MD  Omega-3 Fatty Acids (FISH OIL) 1000 MG CAPS Take 1 capsule (1,000 mg total) by mouth daily. 01/26/14  Yes Gerlene Fee, NP  pantoprazole (PROTONIX) 20 MG tablet Take 20 mg by mouth daily.   Yes Historical Provider, MD  saccharomyces boulardii (FLORASTOR) 250 MG capsule Take 250 mg by mouth 2 (two) times daily. For 1 week   Yes Historical Provider, MD  senna-docusate (SENOKOT-S) 8.6-50 MG per tablet Take 1 tablet by mouth daily.   Yes Historical Provider, MD  warfarin (COUMADIN) 2.5 MG tablet Take 2.5 mg by mouth daily. Take with 4 mg to equal 6.5 mg   Yes Historical Provider, MD  warfarin (COUMADIN) 3 MG tablet Take 3 mg by mouth daily. To be given with 2.5 to make 5.5 mg   Yes Historical Provider, MD    No Known Allergies  Physical Exam  Vitals  Blood pressure 177/93, pulse 72, temperature 98.1 F (36.7 C), temperature source Rectal, resp. rate 20, SpO2 98.00%.   1. General frail elderly AA female lying in bed in NAD,     2. Normal affect and insight, Not Suicidal or Homicidal, Awake but pleasantly confused, cannot follow commands consistently, pocketing food.   3. No F.N deficits, moves all 4 extremities to self, ALL C.Nerves Intact, Strength 5/5 all 4 extremities, Sensation intact all 4 extremities, Plantars down going.  4. Ears and Eyes appear Normal, Conjunctivae clear, PERRLA. Moist Oral Mucosa.  5. Supple Neck, No JVD, No cervical lymphadenopathy appriciated, No Carotid Bruits.  6. Symmetrical Chest wall movement, Good air movement bilaterally, CTAB.  7. RRR, No Gallops, Rubs or Murmurs, No Parasternal Heave.  8. Positive Bowel  Sounds, Abdomen Soft, No tenderness, No organomegaly appriciated,No rebound -guarding or rigidity.  9.  No Cyanosis, Normal Skin Turgor, No Skin Rash or Bruise, chronic neck and facial discoloration and erythema, 1+ edema R leg> Left  10. Good muscle tone,  joints appear normal , no effusions, Normal ROM.  11. No Palpable Lymph Nodes in Neck or Axillae     Data Review  CBC  Recent Labs Lab 05/09/14 1331  WBC 5.6  HGB 12.8  HCT 39.5  PLT 1016*  MCV 103.9*  MCH 33.7  MCHC 32.4  RDW 16.0*   ------------------------------------------------------------------------------------------------------------------  Chemistries   Recent Labs Lab 05/09/14 1331  NA 143  K 4.0  CL 103  CO2 27  GLUCOSE 108*  BUN 14  CREATININE 0.76  CALCIUM 9.3  AST 33  ALT 15  ALKPHOS 65  BILITOT 0.4   ------------------------------------------------------------------------------------------------------------------ CrCl is unknown because both a height and weight (above a minimum accepted value) are required for this calculation. ------------------------------------------------------------------------------------------------------------------ No results found for this basename: TSH, T4TOTAL, FREET3, T3FREE, THYROIDAB,  in the last 72 hours   Coagulation profile  Recent Labs Lab 05/09/14 1331  INR 2.45*   ------------------------------------------------------------------------------------------------------------------- No results found for this basename: DDIMER,  in the last 72 hours -------------------------------------------------------------------------------------------------------------------  Cardiac Enzymes  Recent Labs Lab 05/09/14 1333  TROPONINI <0.30   ------------------------------------------------------------------------------------------------------------------ No components found with this basename: POCBNP,     ---------------------------------------------------------------------------------------------------------------  Urinalysis    Component Value Date/Time   COLORURINE RED* 05/09/2014 1350   APPEARANCEUR CLEAR 05/09/2014 1350   LABSPEC 1.024 05/09/2014 1350   PHURINE 5.5 05/09/2014 1350   GLUCOSEU NEGATIVE 05/09/2014 1350   HGBUR NEGATIVE 05/09/2014 1350   BILIRUBINUR NEGATIVE 05/09/2014 1350   KETONESUR 15* 05/09/2014 1350   PROTEINUR NEGATIVE 05/09/2014 1350   UROBILINOGEN 0.2 05/09/2014 1350   NITRITE NEGATIVE 05/09/2014 1350   LEUKOCYTESUR TRACE* 05/09/2014 1350    ----------------------------------------------------------------------------------------------------------------  Imaging results:   Dg Chest 2 View  05/09/2014   CLINICAL DATA:  Altered mental status.  EXAM: CHEST  2 VIEW  COMPARISON:  Chest x-ray 01/22/2014.  FINDINGS: Lung volumes are low. Lateral views underpenetrated, but there are bibasilar opacities (left greater than right), which may reflect areas of atelectasis and/or consolidation. No definite pleural effusion. Heart size is mildly enlarged. Upper mediastinal contours are within normal limits allowing for patient positioning. No evidence of pulmonary edema.  IMPRESSION: 1. Low lung volumes with bibasilar (left greater than right) areas of atelectasis and/or consolidation. 2. Mild cardiomegaly.   Electronically Signed   By: Vinnie Langton M.D.   On: 05/09/2014 15:00   Ct Head Wo Contrast  05/09/2014   CLINICAL DATA:  Altered mental status  EXAM: CT HEAD WITHOUT CONTRAST  TECHNIQUE: Contiguous axial images were obtained from the base of the skull through the vertex without intravenous contrast.  COMPARISON:  Prior head CT 01/22/2014  FINDINGS: Negative for acute intracranial hemorrhage, acute infarction, mass, mass effect, hydrocephalus or midline shift. Gray-white differentiation is preserved throughout. Similar appearance of advanced confluent periventricular and  subcortical white matter hypoattenuation. While nonspecific, findings are most suggestive of chronic microvascular ischemic white matter disease. Mild mineralization in the bilateral basal ganglia. Ventriculomegaly is similar compared to prior and likely secondary to ex vacuo dilatation related to central atrophy. No focal scalp or calvarial abnormality. Normal aeration of the mastoid air cells and paranasal sinuses. Atherosclerotic calcification in the bilateral cavernous carotid arteries.  IMPRESSION: 1. No acute intracranial abnormality. 2. Similar appearance of advanced chronic microvascular ischemic white matter disease. 3. Ex vacuo dilatation likely secondary to central atrophy without significant interval change. 4. Intracranial atherosclerosis.   Electronically Signed   By: Jacqulynn Cadet M.D.   On: 05/09/2014 15:35    My personal review of EKG: Rhythm NSR, Rate  90 /min,  old non acute ST changes    Assessment & Plan   1. Generalized weakness secondary to UTI which is recurrent - nonfocal exam, head CT nonacute, will be admitted to a telemetry bed, urine and blood cultures, place on IV Rocephin, gentle hydration, have PT and speech evaluate the patient, swallow screen, feeding assistance with aspiration precaution. For now place her on baby aspirin along with home dose statin, in case patient does not respond to above treatment and MRI brain can be done clinical suspicion for a stroke is minimal to none.    2. Recurrent UTIs. Will check post void bladder scan to rule out incomplete bladder emptying. Continue cranberry juice.    3. Hypothyroidism. Check TSH continue home dose Synthroid.    4. History of blood clots. On Coumadin continue pharmacy to monitor dose.    5. GERD. Continue PPI.    6. Essential thrombocytosis. Chronically elevated platelet levels, no acute issues.     7. History of dyslipidemia. Continue Lipitor.     8. Advanced dementia. Continue Aricept  along with Namenda, feeding assistance aspiration precautions. She is due for outpatient neurology evaluation.     9. Hypertension. continue home dose Cozaar and then as needed hydralazine.     10. Moderate protein to limitation on exam. Does have mild third spacing and edema, check prealbumin.       DVT Prophylaxis Coaumdin  AM Labs Ordered, also please review Full Orders  Family Communication: Admission, patients condition and plan of care including tests being ordered have been discussed with the patient and daughter-son in law who indicate understanding and agree with the plan and Code Status.  Code Status Full  Likely DC to  SNF  Condition GUARDED   Time spent in minutes : 35    SINGH,PRASHANT K M.D on 05/09/2014 at 4:35 PM  Between 7am to 7pm - Pager - [phone]  After 7pm go to [email or web address] - password [email or web address]  And look for the night coverage person covering me after hours  [facility] Office  [phone]   **Disclaimer: This note may have been dictated with voice recognition software. Similar sounding words can inadvertently be transcribed and this note may contain transcription errors which may not have been corrected upon publication of note.**

## 2014-05-09 NOTE — ED Provider Notes (Signed)
CSN: [id number]     Arrival date & time 05/09/14  1302 History   First MD Initiated Contact with Patient 05/09/14 1318     Chief Complaint  Patient presents with  . Altered Mental Status      Patient is a 80 y.o. female presenting with altered mental status. The history is provided by a relative. The history is limited by the condition of the patient.  Altered Mental Status Presenting symptoms: confusion   Severity:  Moderate Duration:  1 week Timing:  Constant Progression:  Worsening Chronicity:  New Context: dementia   Associated symptoms: no fever   Pt with h/o dementia, but per family is usually ambulatory/interactive Daughter reports over past week, she has been "lethargic" less interactive, decreased PO intake She also reports patient has been "leaning to the left" She was recently diagnosed with UTI and started on macrobid about 2 days ago (urine culture indicated urine was susceptible to macrobid)  Daughter reports has had frequent UTI previously with similar appearance  Past Medical History  Diagnosis Date  . Hypertension   . Hyperlipidemia   . Thyroid disease     Hypothyroidism  . GERD (gastroesophageal reflux disease)   . Arthritis     Degenerative  . Peripheral vascular disease     Left   Fem-Pop and  tibial Thrombectomy  . High platelet count   . Dementia   . Pain in toe of right foot   . Hypothyroidism    Past Surgical History  Procedure Laterality Date  . Femoral-popliteal bypass graft  2002    Left  Dr. Sherren Mocha Early  . Abdominal hysterectomy    . Cataract surgery      left eye   Family History  Problem Relation Age of Onset  . Heart failure Mother   . Stroke Father    History  Substance Use Topics  . Smoking status: Former Research scientist (life sciences)  . Smokeless tobacco: Never Used  . Alcohol Use: No   OB History   Grav Para Term Preterm Abortions TAB SAB Ect Mult Living                 Review of Systems  Unable to perform ROS: Mental status change   Constitutional: Negative for fever.  Psychiatric/Behavioral: Positive for confusion.      Allergies  Review of patient's allergies indicates no known allergies.  Home Medications   Prior to Admission medications   Medication Sig Start Date End Date Taking? Authorizing Provider  atorvastatin (LIPITOR) 10 MG tablet Take 10 mg by mouth daily.   Yes Historical Provider, MD  calcium-vitamin D (OSCAL WITH D) 500-200 MG-UNIT per tablet Take 1 tablet by mouth 2 (two) times daily.   Yes Historical Provider, MD  Cranberry 125 MG TABS Take 250 mg by mouth daily. 01/22/14  Yes Kristen N Ward, DO  donepezil (ARICEPT) 10 MG tablet Take 10 mg by mouth daily.    Yes Historical Provider, MD  hydroxyurea (HYDREA) 500 MG capsule Take 1,000 mg by mouth daily. May take with food to minimize GI side effects.   Yes Historical Provider, MD  levothyroxine (SYNTHROID, LEVOTHROID) 150 MCG tablet Take 1 tablet (150 mcg total) by mouth daily. 04/10/14  Yes Gerlene Fee, NP  losartan (COZAAR) 50 MG tablet Take 1.5 tablets (75 mg total) by mouth daily. 12/18/13  Yes Gerlene Fee, NP  memantine (NAMENDA) 5 MG tablet Take 1 tablet (5 mg total) by mouth 2 (two) times daily. 01/12/14  Yes Gerlene Fee, NP  Multiple Vitamins-Minerals (MULTI COMPLETE PO) Take 1 tablet by mouth daily.   Yes Historical Provider, MD  nitrofurantoin (MACRODANTIN) 100 MG capsule Take 100 mg by mouth 2 (two) times daily. For 1 week   Yes Historical Provider, MD  Omega-3 Fatty Acids (FISH OIL) 1000 MG CAPS Take 1 capsule (1,000 mg total) by mouth daily. 01/26/14  Yes Gerlene Fee, NP  pantoprazole (PROTONIX) 20 MG tablet Take 20 mg by mouth daily.   Yes Historical Provider, MD  saccharomyces boulardii (FLORASTOR) 250 MG capsule Take 250 mg by mouth 2 (two) times daily. For 1 week   Yes Historical Provider, MD  senna-docusate (SENOKOT-S) 8.6-50 MG per tablet Take 1 tablet by mouth daily.   Yes Historical Provider, MD  warfarin (COUMADIN) 2.5  MG tablet Take 2.5 mg by mouth daily. Take with 4 mg to equal 6.5 mg   Yes Historical Provider, MD  warfarin (COUMADIN) 3 MG tablet Take 3 mg by mouth daily. To be given with 2.5 to make 5.5 mg   Yes Historical Provider, MD   BP 177/93  Pulse 72  Temp(Src) 98.1 F (36.7 C) (Rectal)  Resp 20  SpO2 98% Physical Exam CONSTITUTIONAL: elderly, frail HEAD: Normocephalic/atraumatic EYES: PERRL ENMT: Mucous membranes moist NECK: supple no meningeal signs SPINE:entire spine nontender CV: S1/S2 noted, LUNGS: Lungs are clear to auscultation bilaterally, no apparent distress ABDOMEN: soft, nontender, no rebound or guarding GU:no cva tenderness NEURO: Pt is somnolent but arousable.  No arm or leg drift.  She will answer questions intermittently then drift off to sleep EXTREMITIES: pulses normal, full ROM, no deformity noted to lower extremities SKIN: warm, color normal PSYCH: unable to assesss  ED Course  Procedures   Suspect possible delirium in setting of h/o dementia Potentially failure of outpatient management of UTI (not improving on macrobid) D/w dr Candiss Norse, [facility] will admit Rocephin ordered (susceptible on culture report) Does not appear c/w acute CVA at this time (symptoms for over a week) Labs Review Labs Reviewed  CBC - Abnormal; Notable for the following:    RBC 3.80 (*)    MCV 103.9 (*)    RDW 16.0 (*)    Platelets 1016 (*)    All other components within normal limits  COMPREHENSIVE METABOLIC PANEL - Abnormal; Notable for the following:    Glucose, Bld 108 (*)    Albumin 3.4 (*)    GFR calc non Af Amer 74 (*)    GFR calc Af Amer 86 (*)    All other components within normal limits  URINALYSIS, ROUTINE W REFLEX MICROSCOPIC - Abnormal; Notable for the following:    Color, Urine RED (*)    Ketones, ur 15 (*)    Leukocytes, UA TRACE (*)    All other components within normal limits  PROTIME-INR - Abnormal; Notable for the following:    Prothrombin Time 26.6 (*)    INR  2.45 (*)    All other components within normal limits  URINE CULTURE  TROPONIN I  URINE MICROSCOPIC-ADD ON  PATHOLOGIST SMEAR REVIEW  CBG MONITORING, ED  I-STAT CG4 LACTIC ACID, ED    Imaging Review Dg Chest 2 View  05/09/2014   CLINICAL DATA:  Altered mental status.  EXAM: CHEST  2 VIEW  COMPARISON:  Chest x-ray 01/22/2014.  FINDINGS: Lung volumes are low. Lateral views underpenetrated, but there are bibasilar opacities (left greater than right), which may reflect areas of atelectasis and/or consolidation. No definite pleural effusion.  Heart size is mildly enlarged. Upper mediastinal contours are within normal limits allowing for patient positioning. No evidence of pulmonary edema.  IMPRESSION: 1. Low lung volumes with bibasilar (left greater than right) areas of atelectasis and/or consolidation. 2. Mild cardiomegaly.   Electronically Signed   By: Vinnie Langton M.D.   On: 05/09/2014 15:00   Ct Head Wo Contrast  05/09/2014   CLINICAL DATA:  Altered mental status  EXAM: CT HEAD WITHOUT CONTRAST  TECHNIQUE: Contiguous axial images were obtained from the base of the skull through the vertex without intravenous contrast.  COMPARISON:  Prior head CT 01/22/2014  FINDINGS: Negative for acute intracranial hemorrhage, acute infarction, mass, mass effect, hydrocephalus or midline shift. Gray-white differentiation is preserved throughout. Similar appearance of advanced confluent periventricular and subcortical white matter hypoattenuation. While nonspecific, findings are most suggestive of chronic microvascular ischemic white matter disease. Mild mineralization in the bilateral basal ganglia. Ventriculomegaly is similar compared to prior and likely secondary to ex vacuo dilatation related to central atrophy. No focal scalp or calvarial abnormality. Normal aeration of the mastoid air cells and paranasal sinuses. Atherosclerotic calcification in the bilateral cavernous carotid arteries.  IMPRESSION: 1. No acute  intracranial abnormality. 2. Similar appearance of advanced chronic microvascular ischemic white matter disease. 3. Ex vacuo dilatation likely secondary to central atrophy without significant interval change. 4. Intracranial atherosclerosis.   Electronically Signed   By: Jacqulynn Cadet M.D.   On: 05/09/2014 15:35     EKG Interpretation   Date/Time:  Friday May 09 2014 14:04:16 EDT Ventricular Rate:  92 PR Interval:  135 QRS Duration: 77 QT Interval:  458 QTC Calculation: 567 R Axis:   -14 Text Interpretation:  Sinus rhythm Ventricular bigeminy Low voltage,  precordial leads Nonspecific T abnormalities, lateral leads Borderline  prolonged QT interval Confirmed by Christy Gentles  MD, Elenore Rota ([id number]) on  05/09/2014 3:15:47 PM      MDM   Final diagnoses:  UTI (lower urinary tract infection)  Delirium    Nursing notes including past medical history and social history reviewed and considered in documentation xrays reviewed and considered Labs/vital reviewed and considered     Sharyon Cable, MD 05/09/14 1719

## 2014-05-09 NOTE — ED Notes (Signed)
To ED from [facility] per [facility], per staff, pt being treated for UTI, staff reports gait changed since Monday, per family gait change is older, dementia hx, ambulates with assistance, NO known LSN, VSS, NAD

## 2014-05-09 NOTE — ED Notes (Signed)
Back from Xray.

## 2014-05-09 NOTE — Progress Notes (Signed)
PHARMACIST - PHYSICIAN ORDER COMMUNICATION  CONCERNING: P&T Medication Policy on Herbal Medications  DESCRIPTION:  This patient's order for:  Cranberry tablets  has been noted.  This product(s) is classified as an "herbal" or natural product. Due to a lack of definitive safety studies or FDA approval, nonstandard manufacturing practices, plus the potential risk of unknown drug-drug interactions while on inpatient medications, the Pharmacy and Therapeutics Committee does not permit the use of "herbal" or natural products of this type within [facility].   ACTION TAKEN: The pharmacy department is unable to verify this order at this time and your patient has been informed of this safety policy. Please reevaluate patient's clinical condition at discharge and address if the herbal or natural product(s) should be resumed at that time.  Uvaldo Rising, BCPS  Clinical Pharmacist Pager [phone]  05/09/2014 8:03 PM

## 2014-05-09 NOTE — ED Notes (Signed)
CBG- 92 

## 2014-05-09 NOTE — Progress Notes (Signed)
ANTICOAGULATION CONSULT NOTE - Initial Consult  Pharmacy Consult for [place] Indication: DVT  No Known Allergies  Patient Measurements:    Vital Signs: Temp: 98.1 F (36.7 C) (09/11 1350) Temp src: Rectal (09/11 1350) BP: 177/93 mmHg (09/11 1430) Pulse Rate: 72 (09/11 1430)  Labs:  Recent Labs  05/09/14 1331 05/09/14 1333  HGB 12.8  --   HCT 39.5  --   PLT 1016*  --   LABPROT 26.6*  --   INR 2.45*  --   CREATININE 0.76  --   TROPONINI  --  <0.30    The CrCl is unknown because both a height and weight (above a minimum accepted value) are required for this calculation.   Medical History: Past Medical History  Diagnosis Date  . Hypertension   . Hyperlipidemia   . Thyroid disease     Hypothyroidism  . GERD (gastroesophageal reflux disease)   . Arthritis     Degenerative  . Peripheral vascular disease     Left   Fem-Pop and  tibial Thrombectomy  . High platelet count   . Dementia   . Pain in toe of right foot   . Hypothyroidism     Medications:   (Not in a hospital admission)  Assessment: 80 yo F admitted 05/09/2014 from SNF with AMS. Pharmacy consulted to continue warfarin for history of a DVT.  PMH:  HTN, hyperlipidemia, hypothyroid, dementia, DVT, GERD, fempopbypass, recurrent UTIs.  Coag/Heme:  History of a DVT on warfarin at SNF. Dose PTA = 5.5 mg daily last dose given 9/10, INR at goal H/H wnl, platelets 1016  Goal of Therapy:  INR 2-3 Monitor platelets by anticoagulation protocol: Yes   Plan:  1. Warfarin 5.5 mg PO daily 2. Daily INR  Thank you for allowing pharmacy to be a part of this patients care team.  Rowe Robert Pharm.D., BCPS, AQ-Cardiology Clinical Pharmacist 05/09/2014 4:53 PM Pager: [phone] Phone: [phone]

## 2014-05-10 DIAGNOSIS — F0390 Unspecified dementia without behavioral disturbance: Secondary | ICD-10-CM

## 2014-05-10 DIAGNOSIS — I4891 Unspecified atrial fibrillation: Secondary | ICD-10-CM

## 2014-05-10 DIAGNOSIS — R627 Adult failure to thrive: Secondary | ICD-10-CM

## 2014-05-10 DIAGNOSIS — Z7901 Long term (current) use of anticoagulants: Secondary | ICD-10-CM

## 2014-05-10 DIAGNOSIS — N390 Urinary tract infection, site not specified: Principal | ICD-10-CM

## 2014-05-10 DIAGNOSIS — R404 Transient alteration of awareness: Secondary | ICD-10-CM

## 2014-05-10 LAB — PREALBUMIN: PREALBUMIN: 18.3 mg/dL (ref 17.0–34.0)

## 2014-05-10 LAB — BASIC METABOLIC PANEL
Anion gap: 14 (ref 5–15)
BUN: 10 mg/dL (ref 6–23)
CHLORIDE: 107 meq/L (ref 96–112)
CO2: 24 mEq/L (ref 19–32)
Calcium: 9.2 mg/dL (ref 8.4–10.5)
Creatinine, Ser: 0.64 mg/dL (ref 0.50–1.10)
GFR calc Af Amer: >90 mL/min (ref >90)
GFR, EST NON AFRICAN AMERICAN: 79 mL/min — AB (ref >90)
Glucose, Bld: 94 mg/dL (ref 70–99)
POTASSIUM: 4 meq/L (ref 3.7–5.3)
Sodium: 145 mEq/L (ref 137–147)

## 2014-05-10 LAB — CBC
HCT: 38.2 % (ref 36.0–46.0)
HEMOGLOBIN: 12.4 g/dL (ref 12.0–15.0)
MCH: 33.4 pg (ref 26.0–34.0)
MCHC: 32.5 g/dL (ref 30.0–36.0)
MCV: 103 fL — ABNORMAL HIGH (ref 78.0–100.0)
Platelets: 982 10*3/uL (ref 150–400)
RBC: 3.71 MIL/uL — AB (ref 3.87–5.11)
RDW: 16 % — ABNORMAL HIGH (ref 11.5–15.5)
WBC: 5.1 10*3/uL (ref 4.0–10.5)

## 2014-05-10 LAB — GLUCOSE, CAPILLARY: Glucose-Capillary: 98 mg/dL (ref 70–99)

## 2014-05-10 LAB — PROTIME-INR
INR: 2.37 — ABNORMAL HIGH (ref 0.00–1.49)
Prothrombin Time: 25.9 seconds — ABNORMAL HIGH (ref 11.6–15.2)

## 2014-05-10 LAB — MRSA PCR SCREENING: MRSA by PCR: NEGATIVE

## 2014-05-10 MED ORDER — DILTIAZEM HCL ER COATED BEADS 120 MG PO CP24
120.0000 mg | ORAL_CAPSULE | Freq: Every day | ORAL | Status: DC
  Administered 2014-05-10 – 2014-05-11 (×2): 120 mg via ORAL
  Filled 2014-05-10 (×5): qty 1

## 2014-05-10 NOTE — Progress Notes (Signed)
Pt with hx dementia, is oriented to self only. Safety camera on in room. Pt's daughter aware and in agreement

## 2014-05-10 NOTE — Progress Notes (Signed)
Patient is now on cardizem drip for HR which was sustaining about 120 and high as 180 occasionally.  HR better contolled now on 10/hr drip. It is low 100s high 90s fluctuating.    Patient has reddened area on neck, the daughter says it could be from the patient being "worked up" while in the hospital.  Daughter also is concerned about patients vein bulging out on the right side of her head.

## 2014-05-10 NOTE — Progress Notes (Signed)
[facility] PROGRESS NOTE  Tanya Farmer GYF:[id number] DOB: [birth year] DOA: 05/09/2014 PCP: Blanchie Serve, MD  Assessment/Plan: 1. Failure to thrive/functional decline -I. suspect secondary to underlying infectious etiology. -It appears she had a urinalysis sent several days ago which showed urinary tract infection and was started on Bactrim. -She seems a little improved today, she was ambulated to the hallway and back with assistance -Physical therapy consult  2.  Atrial fibrillation with rapid ventricular response -Overnight patient going into A. fib with RVR, and was started on IV Cardizem -She converted this morning, with the discontinuation of IV Cardizem and transitioning to oral Cardizem at 20 mg by mouth daily -I. suspect underlying infection and probable dehydration contributing to A. fib with RVR -Will check a transthoracic echocardiogram. Last echo performed on 07/06/2013 showed EF of 60-65%. -She is currently anticoagulated with warfarin  3.   Hypertension -Patient's blood pressures controlled -Continue losartan and Cardizem  4.  Anticoagulation -Pharmacy consulted for Coumadin management  5.  Deconditioning -Consult physical therapy  Code Status: Full Code Family Communication: Had family meting with daughter and son-in-law, we discussed diagnosis, treatment and prognosis Disposition Plan: Continue IV AB therapy, anticipate will be discharged to SNF when medically stable   Consultants:  Physical therapy  Antibiotics:  Ceftriaxone 1 g IV every 24 hours  HPI/Subjective: Patient is a pleasant 80 year old female with a past medical history of dementia, presently nursing home resident, who was transferred to the emergency room on 05/09/2014 with complaints of mental status changes, functional decline, having minimal by mouth intake and overall generalized weakness. Initial workup included a head CT which did not show acute intracranial abnormalities. Urinalysis  is showing presence of leukocytes. It was felt this could represent partially treated urinary tract infection which would also account for patient's functional decline. She was started on empiric IV antibiotic therapy with ceftriaxone 1 g IV every 24 hours.   Objective: Filed Vitals:   05/10/14 1357  BP: 118/100  Pulse: 74  Temp: 97.8 F (36.6 C)  Resp: 14    Intake/Output Summary (Last 24 hours) at 05/10/14 1624 Last data filed at 05/10/14 0021  Gross per 24 hour  Intake    120 ml  Output      0 ml  Net    120 ml   Filed Weights   05/09/14 1924 05/10/14 0557  Weight: 57.4 kg (126 lb 8.7 oz) 57.6 kg (126 lb 15.8 oz)    Exam:   General:  Ill-appearing, no acute distress. She was ambulated to the hallway and back  Cardiovascular: Irregular rate and rhythm normal S1-S2  Respiratory: Normal respiratory effort, few bibasilar crackles  Abdomen: Soft nontender nondistended  Musculoskeletal: She does not have rashes lesions or edema  Data Reviewed: Basic Metabolic Panel:  Recent Labs Lab 05/09/14 1331 05/10/14 0525  NA 143 145  K 4.0 4.0  CL 103 107  CO2 27 24  GLUCOSE 108* 94  BUN 14 10  CREATININE 0.76 0.64  CALCIUM 9.3 9.2   Liver Function Tests:  Recent Labs Lab 05/09/14 1331  AST 33  ALT 15  ALKPHOS 65  BILITOT 0.4  PROT 7.6  ALBUMIN 3.4*   No results found for this basename: LIPASE, AMYLASE,  in the last 168 hours No results found for this basename: AMMONIA,  in the last 168 hours CBC:  Recent Labs Lab 05/09/14 1331 05/10/14 0525  WBC 5.6 5.1  HGB 12.8 12.4  HCT 39.5 38.2  MCV 103.9* 103.0*  PLT 1016* 982*   Cardiac Enzymes:  Recent Labs Lab 05/09/14 1333  TROPONINI <0.30   BNP (last 3 results)  Recent Labs  09/16/13 1730  PROBNP 619.3*   CBG:  Recent Labs Lab 05/09/14 1410 05/10/14 0700  GLUCAP 92 98    Recent Results (from the past 240 hour(s))  MRSA PCR SCREENING     Status: None   Collection Time    05/09/14  11:56 PM      Result Value Ref Range Status   MRSA by PCR NEGATIVE  NEGATIVE Final   Comment:            The GeneXpert MRSA Assay (FDA     approved for NASAL specimens     only), is one component of a     comprehensive MRSA colonization     surveillance program. It is not     intended to diagnose MRSA     infection nor to guide or     monitor treatment for     MRSA infections.     Studies: Dg Chest 2 View  05/09/2014   CLINICAL DATA:  Altered mental status.  EXAM: CHEST  2 VIEW  COMPARISON:  Chest x-ray 01/22/2014.  FINDINGS: Lung volumes are low. Lateral views underpenetrated, but there are bibasilar opacities (left greater than right), which may reflect areas of atelectasis and/or consolidation. No definite pleural effusion. Heart size is mildly enlarged. Upper mediastinal contours are within normal limits allowing for patient positioning. No evidence of pulmonary edema.  IMPRESSION: 1. Low lung volumes with bibasilar (left greater than right) areas of atelectasis and/or consolidation. 2. Mild cardiomegaly.   Electronically Signed   By: Vinnie Langton M.D.   On: 05/09/2014 15:00   Ct Head Wo Contrast  05/09/2014   CLINICAL DATA:  Altered mental status  EXAM: CT HEAD WITHOUT CONTRAST  TECHNIQUE: Contiguous axial images were obtained from the base of the skull through the vertex without intravenous contrast.  COMPARISON:  Prior head CT 01/22/2014  FINDINGS: Negative for acute intracranial hemorrhage, acute infarction, mass, mass effect, hydrocephalus or midline shift. Gray-white differentiation is preserved throughout. Similar appearance of advanced confluent periventricular and subcortical white matter hypoattenuation. While nonspecific, findings are most suggestive of chronic microvascular ischemic white matter disease. Mild mineralization in the bilateral basal ganglia. Ventriculomegaly is similar compared to prior and likely secondary to ex vacuo dilatation related to central atrophy. No  focal scalp or calvarial abnormality. Normal aeration of the mastoid air cells and paranasal sinuses. Atherosclerotic calcification in the bilateral cavernous carotid arteries.  IMPRESSION: 1. No acute intracranial abnormality. 2. Similar appearance of advanced chronic microvascular ischemic white matter disease. 3. Ex vacuo dilatation likely secondary to central atrophy without significant interval change. 4. Intracranial atherosclerosis.   Electronically Signed   By: Jacqulynn Cadet M.D.   On: 05/09/2014 15:35    Scheduled Meds: . aspirin  81 mg Oral Daily  . atorvastatin  10 mg Oral Daily  . cefTRIAXone (ROCEPHIN)  IV  1 g Intravenous Q24H  . diltiazem  120 mg Oral Daily  . donepezil  10 mg Oral Daily  . hydroxyurea  1,000 mg Oral Daily  . levothyroxine  150 mcg Oral QAC breakfast  . losartan  75 mg Oral Daily  . memantine  5 mg Oral BID  . omega-3 acid ethyl esters  1 g Oral BID  . pantoprazole  20 mg Oral Daily  . saccharomyces boulardii  250 mg Oral BID  . senna-docusate  1 tablet Oral Daily  . sodium chloride  3 mL Intravenous Q12H  . warfarin  5.5 mg Oral ONCE-1800  . Warfarin - Pharmacist Dosing Inpatient   Does not apply q1800   Continuous Infusions:   Principal Problem:   UTI (lower urinary tract infection) Active Problems:   Peripheral vascular disease, unspecified   Embolism and thrombosis of unspecified site   Essential thrombocytosis   Hypothyroidism   Essential hypertension, benign   Chronic anticoagulation   Dementia without behavioral disturbance   GERD (gastroesophageal reflux disease)   Dyslipidemia   Weakness generalized    Time spent: 35 min    Jaidyn Kuhl  [facility] Pager [phone]. If 7PM-7AM, please contact night-coverage at [email or web address], password [facility] 05/10/2014, 4:24 PM  LOS: 1 day

## 2014-05-10 NOTE — Evaluation (Signed)
Tanya Farmer, [place] CCC-SLP [phone]

## 2014-05-10 NOTE — Progress Notes (Signed)
Pt noted to be wet from urine incontinence. Bladder scan reveals approx 50cc's residual. Dr Coralyn Pear made aware

## 2014-05-10 NOTE — Progress Notes (Signed)
ANTICOAGULATION CONSULT NOTE - Follow Up Consult  Pharmacy Consult for Coumadin Indication: DVT  No Known Allergies  Patient Measurements: Height: 5\' 4"  (162.6 cm) Weight: 126 lb 15.8 oz (57.6 kg) IBW/kg (Calculated) : 54.7 Heparin Dosing Weight:   Vital Signs: Temp: 98.5 F (36.9 C) (09/12 0557) Temp src: Oral (09/12 0557) BP: 120/104 mmHg (09/12 0900) Pulse Rate: 75 (09/12 0700)  Labs:  Recent Labs  05/09/14 1331 05/09/14 1333 05/10/14 0525  HGB 12.8  --  12.4  HCT 39.5  --  38.2  PLT 1016*  --  982*  LABPROT 26.6*  --  25.9*  INR 2.45*  --  2.37*  CREATININE 0.76  --  0.64  TROPONINI  --  <0.30  --     Estimated Creatinine Clearance: 43.6 ml/min (by C-G formula based on Cr of 0.64).   Medications:  Scheduled:  . aspirin  81 mg Oral Daily  . atorvastatin  10 mg Oral Daily  . cefTRIAXone (ROCEPHIN)  IV  1 g Intravenous Q24H  . diltiazem  120 mg Oral Daily  . donepezil  10 mg Oral Daily  . hydroxyurea  1,000 mg Oral Daily  . levothyroxine  150 mcg Oral QAC breakfast  . losartan  75 mg Oral Daily  . memantine  5 mg Oral BID  . omega-3 acid ethyl esters  1 g Oral BID  . pantoprazole  20 mg Oral Daily  . saccharomyces boulardii  250 mg Oral BID  . senna-docusate  1 tablet Oral Daily  . sodium chloride  3 mL Intravenous Q12H  . warfarin  5.5 mg Oral ONCE-1800  . Warfarin - Pharmacist Dosing Inpatient   Does not apply q1800    Assessment: 80yo female with hx DVT on home dose of Coumadin 5.5mg  po daily.  INR 2.37, Hg wnl.  No bleeding noted, CT head (-).  Will continue daily INR as pt is receiving antibiotics for possible UTI.  Goal of Therapy:  INR 2-3 Monitor platelets by anticoagulation protocol: Yes   Plan:  1-  Continue current dose of Coumadin 2-  F/U daily INR and culture results  Gracy Bruins, PharmD [facility]

## 2014-05-10 NOTE — Progress Notes (Signed)
PT Cancellation Note  Patient Details Name: Tanya Farmer MRN: [id number] DOB: 06/29/1926   Cancelled Treatment:    Reason Eval/Treat Not Completed: Medical issues which prohibited therapy Pt lethargic upon entering room.  When aroused, pt began vomitting.  RN notified.  PT to follow up this PM or tomorrow as time allows.   Lorriane Shire 05/10/2014, 2:10 PM

## 2014-05-10 NOTE — Evaluation (Signed)
Clinical/Bedside Swallow Evaluation Patient Details  Name: Tanya Farmer MRN: [id number] Date of Birth: 07-17-1926  Today's Date: 05/10/2014 Time: 1410-1430 SLP Time Calculation (min): 20 min  Past Medical History:  Past Medical History  Diagnosis Date  . Hypertension   . Hyperlipidemia   . Thyroid disease     Hypothyroidism  . GERD (gastroesophageal reflux disease)   . Arthritis     Degenerative  . Peripheral vascular disease     Left   Fem-Pop and  tibial Thrombectomy  . High platelet count   . Dementia   . Pain in toe of right foot   . Hypothyroidism    Past Surgical History:  Past Surgical History  Procedure Laterality Date  . Femoral-popliteal bypass graft  2002    Left  Dr. Sherren Mocha Early  . Abdominal hysterectomy    . Cataract surgery      left eye   HPI:  Tanya Farmer is a 80 y.o. female, with history of hypertension, dyslipidemia, hypothyroidism, advanced dementia living in a nursing home, left leg DVT several years ago on Coumadin, essential thrombocytosis, GERD, left leg femoropopliteal bypass in 2002, recurrent UTIs according to the family for this year, living in a nursing home was brought in for another episode of UTI and being weaker than her usual self. According to the family is on her leaning to the left side which usually happens when she has a UTI, she did get a UA checked a few days ago and it was consistent with UTI, she was started on Bactrim however according to the family she did not show much improvement and thereafter she was brought in the ER.   Assessment / Plan / Recommendation Clinical Impression  Pt presents with cognitive based (dementia) oral and oropharyngeal stage dysphagia characterized by prolonged mastication and piecemeal swallowing of solids with a significant delay in swallow across all consistencies presented. Despite these issues, she shows no signs of aspiration. Concern for adequate intake given the prolonged amount of time it takes her to  consume a meal. Given her prolonged mastication, piecemeal swallowing, and history of GERD, recommend a dysphagia 1 (puree) to facilitate esophageal transit. During this assessment SLP provided verbal and tactile cues for her to take each bite and to follow solids with liquids. Recommend full supervision when eating with cuing for compensatory strategies.     Aspiration Risk  Mild    Diet Recommendation Dysphagia 1 (Puree);Thin liquid   Medication Administration: Crushed with puree Supervision: Full supervision/cueing for compensatory strategies Compensations: Follow solids with liquid;Multiple dry swallows after each bite/sip Postural Changes and/or Swallow Maneuvers: Out of bed for meals;Seated upright 90 degrees;Upright 30-60 min after meal    Other  Recommendations     Follow Up Recommendations       Frequency and Duration        Pertinent Vitals/Pain NONE     SLP Swallow Goals     Swallow Study Prior Functional Status       General HPI: Tanya Farmer is a 80 y.o. female, with history of hypertension, dyslipidemia, hypothyroidism, advanced dementia living in a nursing home, left leg DVT several years ago on Coumadin, essential thrombocytosis, GERD, left leg femoropopliteal bypass in 2002, recurrent UTIs according to the family for this year, living in a nursing home was brought in for another episode of UTI and being weaker than her usual self. According to the family is on her leaning to the left side which usually happens when she has a  UTI, she did get a UA checked a few days ago and it was consistent with UTI, she was started on Bactrim however according to the family she did not show much improvement and thereafter she was brought in the ER. Type of Study: Bedside swallow evaluation Diet Prior to this Study: Dysphagia 3 (soft) Temperature Spikes Noted: No Respiratory Status: Room air Behavior/Cognition: Cooperative;Pleasant mood;Requires cueing Oral Cavity - Dentition:  Adequate natural dentition Self-Feeding Abilities: Able to feed self;Needs assist Patient Positioning: Upright in chair Baseline Vocal Quality: Clear    Oral/Motor/Sensory Function Overall Oral Motor/Sensory Function: Appears within functional limits for tasks assessed   Ice Chips     Thin Liquid Thin Liquid: Within functional limits Presentation: Cup;Straw Pharyngeal  Phase Impairments: Suspected delayed Swallow    Nectar Thick     Honey Thick     Puree     Solid   GO    Solid: Impaired Presentation: Spoon;Self Fed Oral Phase Impairments: Impaired mastication Pharyngeal Phase Impairments: Suspected delayed Swallow;Multiple swallows       Eden Emms 05/10/2014,3:42 PM

## 2014-05-11 LAB — BASIC METABOLIC PANEL
ANION GAP: 13 (ref 5–15)
BUN: 13 mg/dL (ref 6–23)
CHLORIDE: 104 meq/L (ref 96–112)
CO2: 25 meq/L (ref 19–32)
Calcium: 9 mg/dL (ref 8.4–10.5)
Creatinine, Ser: 0.65 mg/dL (ref 0.50–1.10)
GFR calc Af Amer: >90 mL/min (ref >90)
GFR calc non Af Amer: 78 mL/min — ABNORMAL LOW (ref >90)
Glucose, Bld: 92 mg/dL (ref 70–99)
Potassium: 4.1 mEq/L (ref 3.7–5.3)
SODIUM: 142 meq/L (ref 137–147)

## 2014-05-11 LAB — CBC
HCT: 38.8 % (ref 36.0–46.0)
HEMOGLOBIN: 12.7 g/dL (ref 12.0–15.0)
MCH: 33.4 pg (ref 26.0–34.0)
MCHC: 32.7 g/dL (ref 30.0–36.0)
MCV: 102.1 fL — ABNORMAL HIGH (ref 78.0–100.0)
Platelets: 981 10*3/uL (ref 150–400)
RBC: 3.8 MIL/uL — ABNORMAL LOW (ref 3.87–5.11)
RDW: 15.9 % — ABNORMAL HIGH (ref 11.5–15.5)
WBC: 4.3 10*3/uL (ref 4.0–10.5)

## 2014-05-11 LAB — URINE CULTURE
COLONY COUNT: NO GROWTH
CULTURE: NO GROWTH

## 2014-05-11 LAB — PROTIME-INR
INR: 1.87 — AB (ref 0.00–1.49)
Prothrombin Time: 21.5 seconds — ABNORMAL HIGH (ref 11.6–15.2)

## 2014-05-11 MED ORDER — ENOXAPARIN SODIUM 60 MG/0.6ML ~~LOC~~ SOLN
60.0000 mg | Freq: Two times a day (BID) | SUBCUTANEOUS | Status: AC
  Administered 2014-05-11 (×2): 60 mg via SUBCUTANEOUS
  Filled 2014-05-11 (×2): qty 0.6

## 2014-05-11 MED ORDER — WARFARIN SODIUM 7.5 MG PO TABS
7.5000 mg | ORAL_TABLET | ORAL | Status: AC
  Administered 2014-05-11: 7.5 mg via ORAL
  Filled 2014-05-11: qty 1

## 2014-05-11 MED ORDER — WARFARIN SODIUM 5 MG PO TABS
5.5000 mg | ORAL_TABLET | Freq: Every day | ORAL | Status: AC
  Administered 2014-05-12: 5.5 mg via ORAL
  Filled 2014-05-11 (×2): qty 1

## 2014-05-11 NOTE — Clinical Social Work Note (Signed)
CSW made aware of family's request to discuss patient placement at another nursing facility, as patient is currently a resident of [facility]. CSW met with female family member outside of patient's room. Per family member, family would like to speak with MD Coralyn Pear) first and follow-up with CSW regarding SNF placement. CSW provided family member with community SNF list and made RN Chief Operating Officer) aware. CSW to follow-up with family after their discussion with MD.   Carrington Clamp, [place] Weekend Clinical Social Worker [phone]

## 2014-05-11 NOTE — Clinical Social Work Note (Signed)
CSW met with patient's daughter and son in law. Daughter expressed concern regarding level of care patient is receiving while at current facility and recurrent UTI. Per daughter, she cannot sleep at night and often calls at 2am to check on patient and make sure she is being properly cared for. CSW encouraged daughter to discuss concerns with director of nursing and create a realistic plan to address needs of patient and assist with easing her anxiety related to patient's stay. Daughter stated she will speak with administration at [facility], but desires for SNF referral to be sent to [facility] and [facility]. CSW verbalized understanding and made RN Vickii Chafe) aware.   [place], [place] Weekend Clinical Social Worker [phone]

## 2014-05-11 NOTE — Progress Notes (Signed)
[facility] PROGRESS NOTE  Tanya Farmer KKX:[id number] DOB: [birth year] DOA: 05/09/2014 PCP: Blanchie Serve, MD  Assessment/Plan: 1. Failure to thrive/functional decline -I. suspect secondary to underlying infectious etiology. -It appears she had a urinalysis sent several days ago which showed urinary tract infection and was started on Bactrim. -She seems a little improved today -Today we ambulated her with the walker for >200 ft, took encouragement. Family present  2.  Atrial fibrillation with rapid ventricular response -Patient initially going into A. fib with RVR, and was started on IV Cardizem -Remains rate controlled, continue Cardizem 120 mg PO daily -I. suspect underlying infection and probable dehydration contributing to A. fib with RVR -She is currently anticoagulated with warfarin  3.   Hypertension -Patient's blood pressures controlled -Continue losartan and Cardizem  4.  Anticoagulation -Pharmacy consulted for Coumadin management  5.  Deconditioning -Consult physical therapy  6. Goals of care -Family expressing concerns over care that she is receiving at her current nursing home. I explained that SW would be consulted to assist with options for SNF placement. Family also concerned for patient decline and recurrent UTI's and feels that NH may be responsible for this. I explained to family that UTI was a common infection in older adults and this this could occur in nursing homes or in the community. I explained that she several risk factors for getting recurrent UTI's including advanced dementia with impaired bladder emptying. I also explained the overall progressive nature of dementia and associated dementia syndromes. Furthermore hospitalization could lead to further decline in function. Family still interested in changing to another long term care facility.    Code Status: Full Code Family Communication: Had family meting with daughter and son-in-law, we discussed  diagnosis, treatment and prognosis Disposition Plan: Continue IV AB therapy, anticipate will be discharged to SNF when medically stable   Consultants:  Physical therapy  Antibiotics:  Ceftriaxone 1 g IV every 24 hours  HPI/Subjective: Patient is a pleasant 80 year old female with a past medical history of dementia, presently nursing home resident, who was transferred to the emergency room on 05/09/2014 with complaints of mental status changes, functional decline, having minimal by mouth intake and overall generalized weakness. Initial workup included a head CT which did not show acute intracranial abnormalities. Urinalysis is showing presence of leukocytes. It was felt this could represent partially treated urinary tract infection which would also account for patient's functional decline. She was started on empiric IV antibiotic therapy with ceftriaxone 1 g IV every 24 hours.   Objective: Filed Vitals:   05/11/14 1331  BP: 95/59  Pulse: 86  Temp: 97.3 F (36.3 C)  Resp: 16    Intake/Output Summary (Last 24 hours) at 05/11/14 1652 Last data filed at 05/11/14 1028  Gross per 24 hour  Intake    360 ml  Output      0 ml  Net    360 ml   Filed Weights   05/09/14 1924 05/10/14 0557 05/11/14 0524  Weight: 57.4 kg (126 lb 8.7 oz) 57.6 kg (126 lb 15.8 oz) 58 kg (127 lb 13.9 oz)    Exam:   General:  Ill-appearing, no acute distress. She was ambulated to the hallway and back  Cardiovascular: Irregular rate and rhythm normal S1-S2  Respiratory: Normal respiratory effort, few bibasilar crackles  Abdomen: Soft nontender nondistended  Musculoskeletal: She does not have rashes lesions or edema  Data Reviewed: Basic Metabolic Panel:  Recent Labs Lab 05/09/14 1331 05/10/14 0525 05/11/14 0600  NA  143 145 142  K 4.0 4.0 4.1  CL 103 107 104  CO2 27 24 25   GLUCOSE 108* 94 92  BUN 14 10 13   CREATININE 0.76 0.64 0.65  CALCIUM 9.3 9.2 9.0   Liver Function Tests:  Recent  Labs Lab 05/09/14 1331  AST 33  ALT 15  ALKPHOS 65  BILITOT 0.4  PROT 7.6  ALBUMIN 3.4*   No results found for this basename: LIPASE, AMYLASE,  in the last 168 hours No results found for this basename: AMMONIA,  in the last 168 hours CBC:  Recent Labs Lab 05/09/14 1331 05/10/14 0525 05/11/14 0600  WBC 5.6 5.1 4.3  HGB 12.8 12.4 12.7  HCT 39.5 38.2 38.8  MCV 103.9* 103.0* 102.1*  PLT 1016* 982* 981*   Cardiac Enzymes:  Recent Labs Lab 05/09/14 1333  TROPONINI <0.30   BNP (last 3 results)  Recent Labs  09/16/13 1730  PROBNP 619.3*   CBG:  Recent Labs Lab 05/09/14 1410 05/10/14 0700  GLUCAP 92 98    Recent Results (from the past 240 hour(s))  URINE CULTURE     Status: None   Collection Time    05/09/14  1:50 PM      Result Value Ref Range Status   Specimen Description URINE, CATHETERIZED   Final   Special Requests ADDED [id number]   Final   Culture  Setup Time     Final   Value: 05/09/2014 21:32     Performed at [facility]     Final   Value: NO GROWTH     Performed at [facility]   Culture     Final   Value: NO GROWTH     Performed at [facility]   Report Status 05/11/2014 FINAL   Final  CULTURE, BLOOD (ROUTINE X 2)     Status: None   Collection Time    05/09/14  9:26 PM      Result Value Ref Range Status   Specimen Description BLOOD LEFT HAND   Final   Special Requests     Final   Value: BOTTLES DRAWN AEROBIC AND ANAEROBIC 10CC AER,5CC ANA   Culture  Setup Time     Final   Value: 05/10/2014 04:22     Performed at [facility]   Culture     Final   Value:        BLOOD CULTURE RECEIVED NO GROWTH TO DATE CULTURE WILL BE HELD FOR 5 DAYS BEFORE ISSUING A FINAL NEGATIVE REPORT     Performed at [facility]   Report Status PENDING   Incomplete  CULTURE, BLOOD (ROUTINE X 2)     Status: None   Collection Time    05/09/14  9:42 PM      Result Value Ref Range Status   Specimen Description  BLOOD LEFT ARM   Final   Special Requests BOTTLES DRAWN AEROBIC ONLY 5CC   Final   Culture  Setup Time     Final   Value: 05/10/2014 04:23     Performed at [facility]   Culture     Final   Value:        BLOOD CULTURE RECEIVED NO GROWTH TO DATE CULTURE WILL BE HELD FOR 5 DAYS BEFORE ISSUING A FINAL NEGATIVE REPORT     Performed at [facility]   Report Status PENDING   Incomplete  MRSA PCR SCREENING  Status: None   Collection Time    05/09/14 11:56 PM      Result Value Ref Range Status   MRSA by PCR NEGATIVE  NEGATIVE Final   Comment:            The GeneXpert MRSA Assay (FDA     approved for NASAL specimens     only), is one component of a     comprehensive MRSA colonization     surveillance program. It is not     intended to diagnose MRSA     infection nor to guide or     monitor treatment for     MRSA infections.     Studies: No results found.  Scheduled Meds: . aspirin  81 mg Oral Daily  . atorvastatin  10 mg Oral Daily  . cefTRIAXone (ROCEPHIN)  IV  1 g Intravenous Q24H  . diltiazem  120 mg Oral Daily  . donepezil  10 mg Oral Daily  . enoxaparin (LOVENOX) injection  60 mg Subcutaneous Q12H  . hydroxyurea  1,000 mg Oral Daily  . levothyroxine  150 mcg Oral QAC breakfast  . losartan  75 mg Oral Daily  . memantine  5 mg Oral BID  . omega-3 acid ethyl esters  1 g Oral BID  . pantoprazole  20 mg Oral Daily  . saccharomyces boulardii  250 mg Oral BID  . senna-docusate  1 tablet Oral Daily  . sodium chloride  3 mL Intravenous Q12H  . [START ON 05/12/2014] warfarin  5.5 mg Oral q1800  . Warfarin - Pharmacist Dosing Inpatient   Does not apply q1800   Continuous Infusions:   Principal Problem:   UTI (lower urinary tract infection) Active Problems:   Peripheral vascular disease, unspecified   Embolism and thrombosis of unspecified site   Essential thrombocytosis   Hypothyroidism   Essential hypertension, benign   Chronic anticoagulation    Dementia without behavioral disturbance   GERD (gastroesophageal reflux disease)   Dyslipidemia   Weakness generalized    Time spent: 35 min    Man Bonneau  [facility] Pager [phone]. If 7PM-7AM, please contact night-coverage at [email or web address], password [facility] 05/11/2014, 4:52 PM  LOS: 2 days

## 2014-05-11 NOTE — Evaluation (Signed)
Physical Therapy Evaluation Patient Details Name: Tanya Farmer MRN: [id number] DOB: 1925/12/27 Today's Date: 05/11/2014   History of Present Illness  Tanya Farmer  is a 80 y.o. female,  with history of hypertension, dyslipidemia, hypothyroidism, advanced dementia living in a nursing home, left leg DVT several years ago on Coumadin, essential thrombocytosis, GERD, left leg femoropopliteal bypass in 2002, recurrent UTIs according to the family for this year, living in a nursing home was brought in for another episode of UTI and being weaker than her usual self. According to the family is on her leaning to the left side which usually happens when she has a UTI, she did get a UA checked a few days ago and it was consistent with UTI, she was started on Bactrim however according to the family she did not show much improvement and thereafter she was brought in the ER.  Clinical Impression  Pt admitted with left inattention, generalized weakness, and lethargy. Pt currently with functional limitations due to the deficits listed below (see PT Problem List). Pt performed mobility with max A though status fluctuates as she was able to ambulate with nsg yesterday. Pt will benefit from skilled PT to increase their independence and safety with mobility to allow discharge to the venue listed below. PT will continue to follow.         Follow Up Recommendations SNF;Supervision/Assistance - 24 hour    Equipment Recommendations  None recommended by PT    Recommendations for Other Services       Precautions / Restrictions Precautions Precautions: Fall Restrictions Weight Bearing Restrictions: No      Mobility  Bed Mobility Overal bed mobility: Needs Assistance Bed Mobility: Supine to Sit     Supine to sit: Max assist     General bed mobility comments: mvmt has to be manually facilitated as pt will repeat command given and agree but then not follow through physically. Max A needed due to resistance to  mvmt at times. Heavy left lean as sitting achieved.   Transfers Overall transfer level: Needs assistance Equipment used: None Transfers: Sit to/from [facility] Sit to Stand: Max assist Stand pivot transfers: +2 physical assistance;Max assist       General transfer comment: pt needed max A to facilitate fwd wt-shift, therapist in front of pt. NT guarding behind with SPT, pt maintained knee and trunk flexion, difficulty stepping, large BM on floor when stood which limited further mobility. Transferred to [facility], then chair brought to replace BSC, then pt completely worn out.  Ambulation/Gait             General Gait Details: too fatigued and not safe today  Stairs            Wheelchair Mobility    Modified Rankin (Stroke Patients Only)       Balance Overall balance assessment: Needs assistance Sitting-balance support: Bilateral upper extremity supported;Feet supported Sitting balance-Leahy Scale: Poor Sitting balance - Comments: left lean, pt seemed very insecure when brought to midline, resisted midline sitting, mod A needed to maintain sitting Postural control: Left lateral lean Standing balance support: Bilateral upper extremity supported;During functional activity Standing balance-Leahy Scale: Zero Standing balance comment: unable to stand without max A, left lean not as prominent as fwd flexion                             Pertinent Vitals/Pain Pain Assessment: No/denies pain    Home Living Family/patient  expects to be discharged to:: Skilled nursing facility                      Prior Function Level of Independence: Needs assistance   Gait / Transfers Assistance Needed: has needed assistance while in facility and the month preceding that. Daughter reports that she often ambulates bkwds holding pt's hands.   ADL's / Homemaking Assistance Needed: dependent in bathing, dressing, toileting and all IADL, pt did self  feed  Comments: Supervision needed due to dementia for safety     Hand Dominance   Dominant Hand: Right    Extremity/Trunk Assessment   Upper Extremity Assessment: Generalized weakness;Defer to OT evaluation           Lower Extremity Assessment: Difficult to assess due to impaired cognition;RLE deficits/detail;LLE deficits/detail;Generalized weakness RLE Deficits / Details: difficult to assess as pt not following commands consistently, witnessed 3/5 strength at hips and knees but in standing, pt maintained knee and hip flexion  LLE Deficits / Details: same as RLE though pt demonstrates decreased use of left side, left lean, left inattention, but did not test differently right vs left  Cervical / Trunk Assessment: Kyphotic  Communication   Communication: HOH  Cognition Arousal/Alertness: Lethargic Behavior During Therapy: Flat affect Overall Cognitive Status: Impaired/Different from baseline Area of Impairment: Orientation;Following commands;Memory;Safety/judgement;Problem solving Orientation Level: Disoriented to;Place;Time;Situation   Memory: Decreased short-term memory Following Commands: Follows one step commands inconsistently;Follows one step commands with increased time Safety/Judgement: Decreased awareness of safety;Decreased awareness of deficits   Problem Solving: Slow processing;Decreased initiation;Difficulty sequencing;Requires verbal cues;Requires tactile cues General Comments: pt's dementia seems to be progressing. She is very hard to motivate and has been more lethargic. She was lethargic but responding to conversation when I entered the room. However, participated minimally throughout. Nursing reports that sometimes when she is more alert and initiating mobility on her own, she does much better, for example, she walked to nsg station yesterday. Fluctuating mentation.     General Comments      Exercises        Assessment/Plan    PT Assessment Patient  needs continued PT services  PT Diagnosis Difficulty walking;Generalized weakness;Hemiplegia non-dominant side   PT Problem List Decreased strength;Decreased activity tolerance;Decreased balance;Decreased mobility;Decreased coordination;Decreased cognition;Decreased safety awareness;Decreased knowledge of use of DME;Decreased knowledge of precautions  PT Treatment Interventions DME instruction;Gait training;Functional mobility training;Therapeutic activities;Therapeutic exercise;Neuromuscular re-education;Cognitive remediation;Patient/family education;Balance training   PT Goals (Current goals can be found in the Care Plan section) Acute Rehab PT Goals Patient Stated Goal: none stated PT Goal Formulation: With patient/family Time For Goal Achievement: 05/25/14 Potential to Achieve Goals: Fair    Frequency Min 2X/week   Barriers to discharge        Co-evaluation               End of Session Equipment Utilized During Treatment: Gait belt Activity Tolerance: Patient limited by lethargy Patient left: in chair;with call bell/phone within reach;with nursing/sitter in room;with family/visitor present Nurse Communication: Mobility status         Time: 8469-6295 PT Time Calculation (min): 48 min   Charges:   PT Evaluation $Initial PT Evaluation Tier I: 1 Procedure PT Treatments $Therapeutic Activity: 23-37 mins $Neuromuscular Re-education: 8-22 mins   PT G Codes:         Leighton Roach, PT  Acute Rehab Services  [phone]  Leighton Roach 05/11/2014, 11:40 AM

## 2014-05-11 NOTE — Progress Notes (Signed)
ANTICOAGULATION CONSULT NOTE - Follow Up Consult  Pharmacy Consult for Coumadin Indication: DVT  No Known Allergies  Patient Measurements: Height: 5\' 4"  (162.6 cm) Weight: 127 lb 13.9 oz (58 kg) IBW/kg (Calculated) : 54.7 Heparin Dosing Weight:   Vital Signs: Temp: 98.5 F (36.9 C) (09/13 0524) Temp src: Axillary (09/13 0524) BP: 142/86 mmHg (09/13 0524) Pulse Rate: 76 (09/13 0524)  Labs:  Recent Labs  05/09/14 1331 05/09/14 1333 05/10/14 0525 05/11/14 0600  HGB 12.8  --  12.4 12.7  HCT 39.5  --  38.2 38.8  PLT 1016*  --  982* 981*  LABPROT 26.6*  --  25.9* 21.5*  INR 2.45*  --  2.37* 1.87*  CREATININE 0.76  --  0.64 0.65  TROPONINI  --  <0.30  --   --     Estimated Creatinine Clearance: 43.6 ml/min (by C-G formula based on Cr of 0.65).   Medications:  Scheduled:  . aspirin  81 mg Oral Daily  . atorvastatin  10 mg Oral Daily  . cefTRIAXone (ROCEPHIN)  IV  1 g Intravenous Q24H  . diltiazem  120 mg Oral Daily  . donepezil  10 mg Oral Daily  . hydroxyurea  1,000 mg Oral Daily  . levothyroxine  150 mcg Oral QAC breakfast  . losartan  75 mg Oral Daily  . memantine  5 mg Oral BID  . omega-3 acid ethyl esters  1 g Oral BID  . pantoprazole  20 mg Oral Daily  . saccharomyces boulardii  250 mg Oral BID  . senna-docusate  1 tablet Oral Daily  . sodium chloride  3 mL Intravenous Q12H  . [START ON 05/12/2014] warfarin  5.5 mg Oral q1800  . warfarin  7.5 mg Oral NOW  . Warfarin - Pharmacist Dosing Inpatient   Does not apply q1800    Assessment: 80yo female with Hx DVTs on Coumadin 5.5mg  daily at home.  INR 1.87 this AM, though therapeutic on admission.  It appears the dose was not ordered as planned on 9/11 and I did not catch this yesterday.  I have discussed this with Dr. Coralyn Pear, will cover with Lovenox x 24hr given episode AFib/RVR this admission requiring Diltiazem IV to convert.  No bleeding noted.  Hg wnl, essential thrombocytosis on Hydroxyurea  Goal of  Therapy:  INR 2-3 Monitor platelets by anticoagulation protocol: Yes   Plan:  1-  Coumadin 7.5mg  now 2-  Coumadin 5.5mg  daily, start 9/14 3-  Lovenox 60mg  SQ q12 x 2, f/u need to continue based on INR  Gracy Bruins, PharmD [facility]

## 2014-05-11 NOTE — Progress Notes (Signed)
Patient ID: Tanya Farmer, female   DOB: 1926/08/14, 80 y.o.   MRN: [id number]     [facility]  No Known Allergies   Chief Complaint  Patient presents with  . Medical Management of Chronic Issues  . Acute Visit    family concerns    HPI:  She is a long term resident of this facility being seen for the management of her chronic illnesses. She is presently being treated for an uti. Her family is concerned about her frequent uti's. Her daughter is wanting something more done to help prevent uti. She feels as though her mother is not being toileted adequately; not bathed often enough. I spent a great deal of time talking with her about her concerns attempting to explain the causes of her utis such as her worsening dementia; incontinence and decline in her status. Her daughter is concerned that she is declining; her ability to ambulate is worse.   Past Medical History  Diagnosis Date  . Hypertension   . Hyperlipidemia   . Thyroid disease     Hypothyroidism  . GERD (gastroesophageal reflux disease)   . Arthritis     Degenerative  . Peripheral vascular disease     Left   Fem-Pop and  tibial Thrombectomy  . High platelet count   . Dementia   . Pain in toe of right foot   . Hypothyroidism     Past Surgical History  Procedure Laterality Date  . Femoral-popliteal bypass graft  2002    Left  Dr. Sherren Mocha Early  . Abdominal hysterectomy    . Cataract surgery      left eye    VITAL SIGNS BP 118/75  Pulse 68  Ht 5\' 4"  (1.626 m)  Wt 130 lb (58.968 kg)  BMI 22.30 kg/m2   Patient's Medications  New Prescriptions   No medications on file  Previous Medications   ATORVASTATIN (LIPITOR) 10 MG TABLET    Take 10 mg by mouth daily.   CALCIUM-VITAMIN D (OSCAL WITH D) 500-200 MG-UNIT PER TABLET    Take 1 tablet by mouth 2 (two) times daily.   CRANBERRY 125 MG TABS    Take 250 mg by mouth daily.   DONEPEZIL (ARICEPT) 10 MG TABLET    Take 10 mg by mouth daily.    HYDROXYUREA (HYDREA) 500 MG  CAPSULE    Take 1,000 mg by mouth daily. May take with food to minimize GI side effects.   LEVOTHYROXINE (SYNTHROID, LEVOTHROID) 150 MCG TABLET    Take 1 tablet (150 mcg total) by mouth daily.   LOSARTAN (COZAAR) 50 MG TABLET    Take 1.5 tablets (75 mg total) by mouth daily.   MEMANTINE (NAMENDA) 5 MG TABLET    Take 1 tablet (5 mg total) by mouth 2 (two) times daily.   MULTIPLE VITAMINS-MINERALS (MULTI COMPLETE PO)    Take 1 tablet by mouth daily.   OMEGA-3 FATTY ACIDS (FISH OIL) 1000 MG CAPS    Take 1 capsule (1,000 mg total) by mouth daily.   PANTOPRAZOLE (PROTONIX) 40 MG TABLET    Take 40 mg by mouth daily.   WARFARIN (COUMADIN) 2.5 MG TABLET    Take 2.5 mg by mouth daily. Take with 4 mg to equal 6.5 mg   WARFARIN (COUMADIN) 4 MG TABLET    Take 5 mg by mouth daily.   Modified Medications   No medications on file  Discontinued Medications   No medications on file    SIGNIFICANT DIAGNOSTIC  EXAMS   08-20-13: mri of brain Moderate motion artifact.  No evidence of acute infarct.  09-16-13: chest x-ray: Stable cardiomegaly and pulmonary venous congestion. Mild infiltrates developing in the lung bases cannot be excluded. This could be secondary to mild pulmonary edema versus pneumonia.  01-22-14: ct of head: 1. No acute intracranial abnormality.2. Atrophy and chronic microvascular white matter ischemic changes.  01-22-14: chest x-ray: No acute abnormality.  Chronic cardiomegaly.   LABS REVIEWED:  07-05-13: tsh 0.032; vit b12: 913; folate >20 09-16-13: wbc 6.1; hgb 14.1; hct 41.6; mcv 112.1; plt 731; glucose 168; bun 20; creat 0.78; k+4.0; na++143; t protein 8.6; albumin 3.7; BNP 619.3 Urine culture: p mirabilis:  09-18-13; wbc 5.6; hgb 12.8; hct 37.7; mcv 109.9; plt 576; glucose 98; bun 16; creat 0.78; k+3.7; na++139  10-28-13: wbc 4.1; hgb 10.8; hct 33.8; mcv 104; plt 162; glucose 80; bun 24; creat 1.0; k+4.1; na++142; liver normal albumin 3.5  01-01-14: glucose 83; bun 21; creat 0.8; k+3.9;  na++143  01-03-14: urine culture: proteus mirabilis: augmentin 01-16-14: chol 137; ldl 71; trig 81; tsh 55.354  01-21-14: wbc 5.0; hgb 12.9; hct 40.7; mcv 112; plt 855; glucose 85; bun 16; creat 0.81; k+3.9; na++146 liver normal albumin 4.0; hgb a1c 5.7; tsh 34.630; thiamine 161.5; RPR: neg 01-22-14: wbc 4.7; hgb 12.6; hct 39.5; mcv 113.2; plt 794; glucose 106; bun 16; creat 0.78; k+3.5; na++146; tsh 43.420; free T3: 1.7; free T4: 0.86 01-26-14: urine culture: klebsiella pneumoniae treat with levaquin  02-14-14: tsh 17.397  03-31-14: tsh 35.249 04-29-14: tsh 1.48 05-02-14: tsh 1.38; free t3: 3.0; free t4: 2.1 05-04-14: urine culture: e-coli: macrobid.      Review of Systems  Unable to perform ROS    Physical Exam  Constitutional: No distress.  thin  Neck: Neck supple. No JVD present.  Cardiovascular: Normal rate and intact distal pulses.  Heart rate regular  Respiratory: Effort normal and breath sounds normal. No respiratory distress. She has no wheezes.  GI: Soft. Bowel sounds are normal. She exhibits no distension. There is no tenderness.  Musculoskeletal: She exhibits no edema  Is able to move all extremities; is mainly in wheelchair at this time.    Neurological: She is alert.  Skin: Skin is warm and dry. She is not diaphoretic.    ASSESSMENT/PLAN  1. Embolism/anticoagulation management: will continue her coumadin 5 mg daily and will check inr in two weeks and will monitor her status  For herinr of 2.2 will continue her coumadin 5.5 mg daily and will check inr on Monday will monitor   2. Essential thrombocytosis: she is stable will continue hydrea 1 gm daily; will monitor  3. Hypothyroidism; will continue her synthroid at 150 mcg daily; her tsh is presently normal  4. Hypertension: will continue  cozaar to 75 mg daily and will continue to monitor her status   5. Dyslipidemia: will continue lipitor 10 mg daily and fish oil 1 gm daily   6. Gerd: will continue protonix 40 mg daily    7. Insomnia: is stable is not on medications will monitor   8. Dementia: will continue her aricept 10 mg daily and namenda 5 mg twice daily and will monitor her status.   9. Constipation: will continue senna s twice daily   10. UTI: will complete her macrobid that was started for one week on 05-06-14 and will setup a urology consult for her and will monitor her status. Her family is concerned that she has had 4 infections over  the past year.     Time spent with patient 50 minutes.      [name] NP [facility]  Contact [phone] Monday through Friday 8am- 5pm  After hours call [phone]

## 2014-05-12 DIAGNOSIS — R5383 Other fatigue: Secondary | ICD-10-CM

## 2014-05-12 DIAGNOSIS — R5381 Other malaise: Secondary | ICD-10-CM

## 2014-05-12 LAB — PROTIME-INR
INR: 1.96 — ABNORMAL HIGH (ref 0.00–1.49)
PROTHROMBIN TIME: 22.3 s — AB (ref 11.6–15.2)

## 2014-05-12 LAB — PATHOLOGIST SMEAR REVIEW: Path Review: INCREASED

## 2014-05-12 NOTE — Progress Notes (Signed)
[facility] PROGRESS NOTE  Emmanuel Ercole HBZ:[id number] DOB: [birth year] DOA: 05/09/2014 PCP: Blanchie Serve, MD  Assessment/Plan: 1. Failure to thrive/functional decline -I. suspect secondary to underlying infectious etiology. -It appears she had a urinalysis sent several days ago which showed urinary tract infection and was started on Bactrim. -She seems a little improved today -on 05/11/2014 we ambulated her with the walker for >200 ft, took encouragement. Family present  2.  Atrial fibrillation with rapid ventricular response -Patient initially going into A. fib with RVR, and was started on IV Cardizem -Remains rate controlled, continue Cardizem 120 mg PO daily -I. suspect underlying infection and probable dehydration contributing to A. fib with RVR -This am her HR's coming down to 49 for which cardizem was held.  -She is currently anticoagulated with warfarin  3.   Hypertension -Patient's blood pressures low this morning, cardizem was stopped  4.  Anticoagulation -Pharmacy consulted for Coumadin management  5.  Deconditioning -Consult physical therapy  6. Goals of care -Family expressing concerns over care that she is receiving at her current nursing home. I explained that SW would be consulted to assist with options for SNF placement. Family also concerned for patient decline and recurrent UTI's and feels that NH may be responsible for this. I explained to family that UTI was a common infection in older adults and this this could occur in nursing homes or in the community. I explained that she several risk factors for getting recurrent UTI's including advanced dementia with impaired bladder emptying. I also explained the overall progressive nature of dementia and associated dementia syndromes. Furthermore hospitalization could lead to further decline in function. Family having concerns for Mrs Caras's care. SW involved.     Code Status: Full Code Family Communication: Had family  meting with daughter and son-in-law, we discussed diagnosis, treatment and prognosis Disposition Plan: Continue IV AB therapy, anticipate will be discharged to SNF when medically stable   Consultants:  Physical therapy  Antibiotics:  Ceftriaxone 1 g IV every 24 hours  HPI/Subjective: Patient is a pleasant 80 year old female with a past medical history of dementia, presently nursing home resident, who was transferred to the emergency room on 05/09/2014 with complaints of mental status changes, functional decline, having minimal by mouth intake and overall generalized weakness. Initial workup included a head CT which did not show acute intracranial abnormalities. Urinalysis is showing presence of leukocytes. It was felt this could represent partially treated urinary tract infection which would also account for patient's functional decline. She was started on empiric IV antibiotic therapy with ceftriaxone 1 g IV every 24 hours.   Objective: Filed Vitals:   05/12/14 1424  BP: 96/61  Pulse: 74  Temp: 97.9 F (36.6 C)  Resp: 16    Intake/Output Summary (Last 24 hours) at 05/12/14 1448 Last data filed at 05/11/14 2300  Gross per 24 hour  Intake    120 ml  Output      0 ml  Net    120 ml   Filed Weights   05/10/14 0557 05/11/14 0524 05/12/14 0559  Weight: 57.6 kg (126 lb 15.8 oz) 58 kg (127 lb 13.9 oz) 55.9 kg (123 lb 3.8 oz)    Exam:   General:  Ill-appearing, no acute distress. She was ambulated to the hallway and back  Cardiovascular: Irregular rate and rhythm normal S1-S2  Respiratory: Normal respiratory effort, few bibasilar crackles  Abdomen: Soft nontender nondistended  Musculoskeletal: She does not have rashes lesions or edema  Data Reviewed:  Basic Metabolic Panel:  Recent Labs Lab 05/09/14 1331 05/10/14 0525 05/11/14 0600  NA 143 145 142  K 4.0 4.0 4.1  CL 103 107 104  CO2 27 24 25   GLUCOSE 108* 94 92  BUN 14 10 13   CREATININE 0.76 0.64 0.65  CALCIUM  9.3 9.2 9.0   Liver Function Tests:  Recent Labs Lab 05/09/14 1331  AST 33  ALT 15  ALKPHOS 65  BILITOT 0.4  PROT 7.6  ALBUMIN 3.4*   No results found for this basename: LIPASE, AMYLASE,  in the last 168 hours No results found for this basename: AMMONIA,  in the last 168 hours CBC:  Recent Labs Lab 05/09/14 1331 05/10/14 0525 05/11/14 0600  WBC 5.6 5.1 4.3  HGB 12.8 12.4 12.7  HCT 39.5 38.2 38.8  MCV 103.9* 103.0* 102.1*  PLT 1016* 982* 981*   Cardiac Enzymes:  Recent Labs Lab 05/09/14 1333  TROPONINI <0.30   BNP (last 3 results)  Recent Labs  09/16/13 1730  PROBNP 619.3*   CBG:  Recent Labs Lab 05/09/14 1410 05/10/14 0700  GLUCAP 92 98    Recent Results (from the past 240 hour(s))  URINE CULTURE     Status: None   Collection Time    05/09/14  1:50 PM      Result Value Ref Range Status   Specimen Description URINE, CATHETERIZED   Final   Special Requests ADDED [id number]   Final   Culture  Setup Time     Final   Value: 05/09/2014 21:32     Performed at [place]     Final   Value: NO GROWTH     Performed at [facility]   Culture     Final   Value: NO GROWTH     Performed at [facility]   Report Status 05/11/2014 FINAL   Final  CULTURE, BLOOD (ROUTINE X 2)     Status: None   Collection Time    05/09/14  9:26 PM      Result Value Ref Range Status   Specimen Description BLOOD LEFT HAND   Final   Special Requests     Final   Value: BOTTLES DRAWN AEROBIC AND ANAEROBIC 10CC AER,5CC ANA   Culture  Setup Time     Final   Value: 05/10/2014 04:22     Performed at [facility]   Culture     Final   Value:        BLOOD CULTURE RECEIVED NO GROWTH TO DATE CULTURE WILL BE HELD FOR 5 DAYS BEFORE ISSUING A FINAL NEGATIVE REPORT     Performed at [facility]   Report Status PENDING   Incomplete  CULTURE, BLOOD (ROUTINE X 2)     Status: None   Collection Time    05/09/14  9:42 PM       Result Value Ref Range Status   Specimen Description BLOOD LEFT ARM   Final   Special Requests BOTTLES DRAWN AEROBIC ONLY 5CC   Final   Culture  Setup Time     Final   Value: 05/10/2014 04:23     Performed at [facility]   Culture     Final   Value:        BLOOD CULTURE RECEIVED NO GROWTH TO DATE CULTURE WILL BE HELD FOR 5 DAYS BEFORE ISSUING A FINAL NEGATIVE REPORT     Performed at [facility]  Report Status PENDING   Incomplete  MRSA PCR SCREENING     Status: None   Collection Time    05/09/14 11:56 PM      Result Value Ref Range Status   MRSA by PCR NEGATIVE  NEGATIVE Final   Comment:            The GeneXpert MRSA Assay (FDA     approved for NASAL specimens     only), is one component of a     comprehensive MRSA colonization     surveillance program. It is not     intended to diagnose MRSA     infection nor to guide or     monitor treatment for     MRSA infections.     Studies: No results found.  Scheduled Meds: . aspirin  81 mg Oral Daily  . atorvastatin  10 mg Oral Daily  . cefTRIAXone (ROCEPHIN)  IV  1 g Intravenous Q24H  . diltiazem  120 mg Oral Daily  . donepezil  10 mg Oral Daily  . hydroxyurea  1,000 mg Oral Daily  . levothyroxine  150 mcg Oral QAC breakfast  . losartan  75 mg Oral Daily  . memantine  5 mg Oral BID  . omega-3 acid ethyl esters  1 g Oral BID  . pantoprazole  20 mg Oral Daily  . saccharomyces boulardii  250 mg Oral BID  . senna-docusate  1 tablet Oral Daily  . sodium chloride  3 mL Intravenous Q12H  . warfarin  5.5 mg Oral q1800  . Warfarin - Pharmacist Dosing Inpatient   Does not apply q1800   Continuous Infusions:   Principal Problem:   UTI (lower urinary tract infection) Active Problems:   Peripheral vascular disease, unspecified   Embolism and thrombosis of unspecified site   Essential thrombocytosis   Hypothyroidism   Essential hypertension, benign   Chronic anticoagulation   Dementia without behavioral  disturbance   GERD (gastroesophageal reflux disease)   Dyslipidemia   Weakness generalized    Time spent: 40 min I spent 30 min with family members today in addressing their concerns and answering questions.   Kelvin Cellar  [facility] Pager [phone]. If 7PM-7AM, please contact night-coverage at [email or web address], password [facility] 05/12/2014, 2:48 PM  LOS: 3 days

## 2014-05-12 NOTE — Progress Notes (Signed)
Alert and oriented x 3. Very pleasant and cooperative. Skin warm and dry. Respirations deep and even. Lungs clear. Nagging dry cough noted. Denies pain and discomfort.

## 2014-05-12 NOTE — Clinical Social Work Placement (Addendum)
     Clinical Social Work Department CLINICAL SOCIAL WORK PLACEMENT NOTE 05/14/2014  Patient:  Tanya Farmer, Tanya Farmer  Account Number:  [id number] Admit date:  05/09/2014  Clinical Social Worker:  Butch Penny Toshua Honsinger, LCSWA  Date/time:  05/12/2014 10:33 PM  Clinical Social Work is seeking post-discharge placement for this patient at the following level of care:   SKILLED NURSING   (*CSW will update this form in Epic as items are completed)     Patient/family provided with [place] Department of Clinical Social Works list of facilities offering this level of care within the geographic area requested by the patient (or if unable, by the patients family).    Patient/family informed of their freedom to choose among providers that offer the needed level of care, that participate in Medicare, Medicaid or managed care program needed by the patient, have an available bed and are willing to accept the patient.    Patient/family informed of [facility] ownership interest in [facility], as well as of the fact that they are under no obligation to receive care at this facility.  PASARR submitted to EDS on  PASARR number received on   FL2 transmitted to all facilities in geographic area requested by pt/family on  05/12/2014 FL2 transmitted to all facilities within larger geographic area on   Patient informed that his/her managed care company has contracts with or will negotiate with  certain facilities, including the following:   NA  Should have new Medicare benefit period     Patient/family informed of bed offers received:  05/12/2014 [facility] Physician recommends and patient chooses bed at    Patient to be transferred to [facility] on  05/13/2014 Patient to be transferred to facility by Ambulance  [facility]) Patient and family notified of transfer on  Name of family member notified:    The following physician request were entered in Epic: Physician Request   Please sign FL2.  Please prepare priority discharge summary and prescriptions.    Additional Comments: DC 05/13/14 BACK to [facility]. Daughter toured other SNFs and elected to return to [facility].  DC facilitated by Tanya Farmer, LCSWA, CSW coverage.   CSW signing off.  Tanya Farmer, Tanya Farmer

## 2014-05-12 NOTE — Progress Notes (Signed)
Summary:  CSW met multiple times with patient's daughter and son-in-law today in effort to complete d/c plan.  Per MD- patient is medically stable for d/c today but daughter felt that this was not communicated to her over the weekend and she thought her mother would remain in the hospital to receive further therapy.  CSW explained that patient can receive PT at SNF level. Extensive discussions were held regarding daughter's concerns about her mother's health, care issues at SNF level and a need to verbalize her fears and frustrations over her mother's medical conditions. She is very concerned that her mother continues to get UTI's and feels that she should be able to ambulate better.  As the day progressed, CSW set up a plan with daughter to agree to a full SNF search in [place] as well as to request return to [facility] and daughter would meet with staff to set up a better care plan for her mother.  CSW spoke to [place] at [facility] who stated that they would consider accepting patient back but need to review clinicals. CSW has sent clinicals.  Daughter is aware of medical stability and aware that d/c plan must be facilitated tomorrow.  CSW services will follow up re: bed offers/return to [facility] in the a.m.  Fl2 completed.  Lorie Phenix. Pauline Good, [place]

## 2014-05-12 NOTE — Progress Notes (Signed)
ANTICOAGULATION CONSULT NOTE - Follow Up Consult  Pharmacy Consult for Coumadin Indication: DVT  No Known Allergies  Patient Measurements: Height: 5\' 4"  (162.6 cm) Weight: 123 lb 3.8 oz (55.9 kg) IBW/kg (Calculated) : 54.7 Heparin Dosing Weight:   Vital Signs: Temp: 97.8 F (36.6 C) (09/14 0559) Temp src: Axillary (09/13 2035) BP: 147/83 mmHg (09/14 0559) Pulse Rate: 95 (09/14 0559)  Labs:  Recent Labs  05/09/14 1331 05/09/14 1333 05/10/14 0525 05/11/14 0600 05/12/14 0436  HGB 12.8  --  12.4 12.7  --   HCT 39.5  --  38.2 38.8  --   PLT 1016*  --  982* 981*  --   LABPROT 26.6*  --  25.9* 21.5* 22.3*  INR 2.45*  --  2.37* 1.87* 1.96*  CREATININE 0.76  --  0.64 0.65  --   TROPONINI  --  <0.30  --   --   --     Estimated Creatinine Clearance: 43.6 ml/min (by C-G formula based on Cr of 0.65).   Medications:  Scheduled:  . aspirin  81 mg Oral Daily  . atorvastatin  10 mg Oral Daily  . cefTRIAXone (ROCEPHIN)  IV  1 g Intravenous Q24H  . diltiazem  120 mg Oral Daily  . donepezil  10 mg Oral Daily  . hydroxyurea  1,000 mg Oral Daily  . levothyroxine  150 mcg Oral QAC breakfast  . losartan  75 mg Oral Daily  . memantine  5 mg Oral BID  . omega-3 acid ethyl esters  1 g Oral BID  . pantoprazole  20 mg Oral Daily  . saccharomyces boulardii  250 mg Oral BID  . senna-docusate  1 tablet Oral Daily  . sodium chloride  3 mL Intravenous Q12H  . warfarin  5.5 mg Oral q1800  . Warfarin - Pharmacist Dosing Inpatient   Does not apply q1800    Assessment: 80yo female with Hx DVTs on Coumadin 5.5mg  daily at home.   INR = 1.96  Goal of Therapy:  INR 2-3 Monitor platelets by anticoagulation protocol: Yes   Plan:  Coumadin 5.5 mg po daily at 1800 pm Continue Lovenox Follow up AM labs  Thank you. Anette Guarneri, PharmD [phone]

## 2014-05-13 LAB — CBC
HCT: 38.7 % (ref 36.0–46.0)
HEMOGLOBIN: 12.2 g/dL (ref 12.0–15.0)
MCH: 32.5 pg (ref 26.0–34.0)
MCHC: 31.5 g/dL (ref 30.0–36.0)
MCV: 103.2 fL — ABNORMAL HIGH (ref 78.0–100.0)
PLATELETS: 1109 10*3/uL — AB (ref 150–400)
RBC: 3.75 MIL/uL — ABNORMAL LOW (ref 3.87–5.11)
RDW: 16.1 % — AB (ref 11.5–15.5)
WBC: 3.6 10*3/uL — ABNORMAL LOW (ref 4.0–10.5)

## 2014-05-13 LAB — BASIC METABOLIC PANEL
ANION GAP: 12 (ref 5–15)
BUN: 15 mg/dL (ref 6–23)
CO2: 28 mEq/L (ref 19–32)
CREATININE: 0.78 mg/dL (ref 0.50–1.10)
Calcium: 9 mg/dL (ref 8.4–10.5)
Chloride: 106 mEq/L (ref 96–112)
GFR, EST AFRICAN AMERICAN: 85 mL/min — AB (ref >90)
GFR, EST NON AFRICAN AMERICAN: 74 mL/min — AB (ref >90)
Glucose, Bld: 75 mg/dL (ref 70–99)
Potassium: 3.9 mEq/L (ref 3.7–5.3)
Sodium: 146 mEq/L (ref 137–147)

## 2014-05-13 LAB — PROTIME-INR
INR: 1.8 — AB (ref 0.00–1.49)
PROTHROMBIN TIME: 20.9 s — AB (ref 11.6–15.2)

## 2014-05-13 MED ORDER — ASPIRIN 81 MG PO CHEW: 81.0000 mg | CHEWABLE_TABLET | Freq: Every day | ORAL | Status: DC

## 2014-05-13 MED ORDER — CEFUROXIME AXETIL 250 MG PO TABS: 250.0000 mg | ORAL_TABLET | Freq: Two times a day (BID) | ORAL | Status: DC

## 2014-05-13 MED ORDER — WARFARIN SODIUM 7.5 MG PO TABS
7.5000 mg | ORAL_TABLET | Freq: Once | ORAL | Status: DC
  Filled 2014-05-13: qty 1

## 2014-05-13 MED ORDER — POLYETHYLENE GLYCOL 3350 17 G PO PACK: 17.0000 g | PACK | Freq: Every day | ORAL | Status: DC | PRN

## 2014-05-13 MED ORDER — WARFARIN SODIUM 5 MG PO TABS: 5.5000 mg | ORAL_TABLET | Freq: Every day | ORAL | Status: DC

## 2014-05-13 NOTE — Clinical Social Work Note (Addendum)
Pt daughter wishes for pt to return to [facility].  Daughter spoke with SW at [facility] and was advised that pt CAN return.  CSW confirmed with Florentina Jenny at [facility] that pt can return to SNF today.   Transportation: Charity fundraiser on chart.  [facility] called.  MD and RN aware.    CSW spoke with pt daughter and presented bed offers.  Pt daughter requests to visit the facilities: [facility], [facility] (both) and [facility].  Daughter will contact CSW with bed choice.   Nonnie Done, [place] [phone]  Psychiatric & Orthopedics (5N 1-16) Clinical Social Worker

## 2014-05-13 NOTE — Progress Notes (Signed)
Patient discharged to [facility]. Report called to nurse that will be taking care of patient. Daughter was here at hospital when ambulance arrived for transport.

## 2014-05-13 NOTE — Discharge Summary (Addendum)
Physician Discharge Summary  Tanya Farmer KVQ:[id number] DOB: 06/21/1926 DOA: 05/09/2014  PCP: Blanchie Serve, MD  Admit date: 05/09/2014 Discharge date: 05/13/2014  Time spent: 35 minutes  Recommendations for Outpatient Follow-up:  1. Please check PT/INR biweekly, patient on coumadin therapy, INR=1.8 on day of discharge 2. Ceftin 250 mg PO BID anticipated stop date: 05/16/2014 3. Follow up on heart rates. Initially presented with Afib RVR 4. Physical Therapy  Discharge Diagnoses:  Principal Problem:   UTI (lower urinary tract infection) Active Problems:   Peripheral vascular disease, unspecified   Embolism and thrombosis of unspecified site   Essential thrombocytosis   Hypothyroidism   Essential hypertension, benign   Chronic anticoagulation   Dementia without behavioral disturbance   GERD (gastroesophageal reflux disease)   Dyslipidemia   Weakness generalized   Discharge Condition: Stable  Diet recommendation: Dysphagia 1 Diet   Filed Weights   05/12/14 0559 05/13/14 0309 05/13/14 0312  Weight: 55.9 kg (123 lb 3.8 oz) 56.3 kg (124 lb 1.9 oz) 56.3 kg (124 lb 1.9 oz)    History of present illness:  Tanya Farmer is a 80 y.o. female, with history of hypertension, dyslipidemia, hypothyroidism, advanced dementia living in a nursing home, left leg DVT several years ago on Coumadin, essential thrombocytosis, GERD, left leg femoropopliteal bypass in 2002, recurrent UTIs according to the family for this year, living in a nursing home was brought in for another episode of UTI and being weaker than her usual self. According to the family is on her leaning to the left side which usually happens when she has a UTI, she did get a UA checked a few days ago and it was consistent with UTI, she was started on Bactrim however according to the family she did not show much improvement and thereafter she was brought in the ER.  In the ER workup consistent with generalized weakness, initial lab work was  stable, head CT was nonacute, UA showed partially treated UTI, her exam in the ER was nonfocal consistent with advanced dementia, house call to admit the patient for UTI with generalized weakness and failure to thrive.   Hospital Course:   Patient is a pleasant 80 year old female with a past medical history of dementia, presently nursing home resident, who was transferred to the emergency room on 05/09/2014 with complaints of mental status changes, functional decline, having minimal by mouth intake and overall generalized weakness. Initial workup included a head CT which did not show acute intracranial abnormalities. Urinalysis is showing presence of leukocytes. It was felt this could represent partially treated urinary tract infection which would also account for patient's functional decline. She was started on empiric IV antibiotic therapy with ceftriaxone 1 g IV every 24 hours.   1. Failure to thrive/functional decline -I suspect secondary to underlying infectious etiology as well as overall progression of underlying advanced dementia -Physical therapy at [facility] [place] -on 05/11/2014 we ambulated her with the walker for >200 ft, took encouragement. Family present  2. Atrial fibrillation with rapid ventricular response  -Patient initially going into A. fib with RVR, and was started on IV Cardizem  -Was transitioned to Cardizem 120 mg PO daily  -I suspected underlying infection and probable dehydration contributing to A. fib with RVR  -Then on 05/12/2014 HR's coming down to 49 for which cardizem was stopped -Please follow up on Heart Rates.  3. Hypertension  -Improved -Patient's blood pressures low and was bradycardic for which was stopped  4. Anticoagulation  -Please check PT/INR  biweekly 5. Deconditioning  -Physical therapy at SNF 6. Goals of care  -Family expressing concerns over care that she is receiving at her current nursing home. I explained that SW consulted to assist with  options for SNF placement. Family also concerned for patient decline and recurrent UTI's and had concerns over care received from SNF. I explained to family that UTI was a common infection in older adults and this this could occur in nursing homes or in the community. I explained that she several risk factors for getting recurrent UTI's including advanced dementia with impaired bladder emptying. I also explained the overall progressive nature of dementia and associated dementia syndromes. [name] is at risk for developing future infections and furthermore hospitalizations could lead to further decline in function. Family wishing for patient to be transferred back to [facility] during which time they will seek other long term care facilities.     Consultations:  Social Work  Physical Therapy  Discharge Exam: Filed Vitals:   05/13/14 1343  BP: 117/71  Pulse: 78  Temp: 98.7 F (37.1 C)  Resp: 16   General: Elderly female with advanced dementia, she is able to be arounsed and follow a few simple commands Cardiovascular: Irregular rate and rhythm normal S1-S2  Respiratory: Normal respiratory effort, few bibasilar crackles  Abdomen: Soft nontender nondistended  Musculoskeletal: She does not have rashes lesions or edema   Discharge Instructions You were cared for by a hospitalist during your hospital stay. If you have any questions about your discharge medications or the care you received while you were in the hospital after you are discharged, you can call the unit and asked to speak with the hospitalist on call if the hospitalist that took care of you is not available. Once you are discharged, your primary care physician will handle any further medical issues. Please note that NO REFILLS for any discharge medications will be authorized once you are discharged, as it is imperative that you return to your primary care physician (or establish a relationship with a primary care physician if you  do not have one) for your aftercare needs so that they can reassess your need for medications and monitor your lab values.  Discharge Instructions   Call MD for:  difficulty breathing, headache or visual disturbances    Complete by:  As directed      Call MD for:  extreme fatigue    Complete by:  As directed      Call MD for:  hives    Complete by:  As directed      Call MD for:  persistant dizziness or light-headedness    Complete by:  As directed      Call MD for:  persistant nausea and vomiting    Complete by:  As directed      Call MD for:  redness, tenderness, or signs of infection (pain, swelling, redness, odor or green/yellow discharge around incision site)    Complete by:  As directed      Call MD for:  severe uncontrolled pain    Complete by:  As directed      Call MD for:  temperature >100.4    Complete by:  As directed      Diet - low sodium heart healthy    Complete by:  As directed      Increase activity slowly    Complete by:  As directed           Current Discharge  Medication List    START taking these medications   Details  aspirin 81 MG chewable tablet Chew 1 tablet (81 mg total) by mouth daily. Qty: 30 tablet, Refills: 1    cefUROXime (CEFTIN) 250 MG tablet Take 1 tablet (250 mg total) by mouth 2 (two) times daily with a meal. Qty: 6 tablet, Refills: 0    polyethylene glycol (MIRALAX / GLYCOLAX) packet Take 17 g by mouth daily as needed for mild constipation. Qty: 14 each, Refills: 0      CONTINUE these medications which have NOT CHANGED   Details  atorvastatin (LIPITOR) 10 MG tablet Take 10 mg by mouth daily.    calcium-vitamin D (OSCAL WITH D) 500-200 MG-UNIT per tablet Take 1 tablet by mouth 2 (two) times daily.    Cranberry 125 MG TABS Take 250 mg by mouth daily.    donepezil (ARICEPT) 10 MG tablet Take 10 mg by mouth daily.     hydroxyurea (HYDREA) 500 MG capsule Take 1,000 mg by mouth daily. May take with food to minimize GI side effects.     levothyroxine (SYNTHROID, LEVOTHROID) 150 MCG tablet Take 1 tablet (150 mcg total) by mouth daily. Qty: 90 tablet, Refills: 3   Associated Diagnoses: Hypothyroidism, unspecified hypothyroidism type    losartan (COZAAR) 50 MG tablet Take 1.5 tablets (75 mg total) by mouth daily. Qty: 30 tablet, Refills: 11   Associated Diagnoses: Essential hypertension, benign    memantine (NAMENDA) 5 MG tablet Take 1 tablet (5 mg total) by mouth 2 (two) times daily. Qty: 60 tablet, Refills: 11   Associated Diagnoses: Dementia without behavioral disturbance    Multiple Vitamins-Minerals (MULTI COMPLETE PO) Take 1 tablet by mouth daily.    Omega-3 Fatty Acids (FISH OIL) 1000 MG CAPS Take 1 capsule (1,000 mg total) by mouth daily. Qty: 30 capsule, Refills: 11   Associated Diagnoses: Dyslipidemia    pantoprazole (PROTONIX) 20 MG tablet Take 20 mg by mouth daily.    saccharomyces boulardii (FLORASTOR) 250 MG capsule Take 250 mg by mouth 2 (two) times daily. For 1 week    senna-docusate (SENOKOT-S) 8.6-50 MG per tablet Take 1 tablet by mouth daily.    warfarin (COUMADIN) 2.5 MG tablet Take 2.5 mg by mouth daily. Take with 4 mg to equal 6.5 mg      STOP taking these medications     nitrofurantoin (MACRODANTIN) 100 MG capsule        No Known Allergies Follow-up Information   Follow up with Blanchie Serve, MD In 1 week.   Specialty:  Internal Medicine   Contact information:   [place] [address] [phone]        The results of significant diagnostics from this hospitalization (including imaging, microbiology, ancillary and laboratory) are listed below for reference.    Significant Diagnostic Studies: Dg Chest 2 View  05/09/2014   CLINICAL DATA:  Altered mental status.  EXAM: CHEST  2 VIEW  COMPARISON:  Chest x-ray 01/22/2014.  FINDINGS: Lung volumes are low. Lateral views underpenetrated, but there are bibasilar opacities (left greater than right), which may reflect  areas of atelectasis and/or consolidation. No definite pleural effusion. Heart size is mildly enlarged. Upper mediastinal contours are within normal limits allowing for patient positioning. No evidence of pulmonary edema.  IMPRESSION: 1. Low lung volumes with bibasilar (left greater than right) areas of atelectasis and/or consolidation. 2. Mild cardiomegaly.   Electronically Signed   By: Vinnie Langton M.D.   On: 05/09/2014  15:00   Ct Head Wo Contrast  05/09/2014   CLINICAL DATA:  Altered mental status  EXAM: CT HEAD WITHOUT CONTRAST  TECHNIQUE: Contiguous axial images were obtained from the base of the skull through the vertex without intravenous contrast.  COMPARISON:  Prior head CT 01/22/2014  FINDINGS: Negative for acute intracranial hemorrhage, acute infarction, mass, mass effect, hydrocephalus or midline shift. Gray-white differentiation is preserved throughout. Similar appearance of advanced confluent periventricular and subcortical white matter hypoattenuation. While nonspecific, findings are most suggestive of chronic microvascular ischemic white matter disease. Mild mineralization in the bilateral basal ganglia. Ventriculomegaly is similar compared to prior and likely secondary to ex vacuo dilatation related to central atrophy. No focal scalp or calvarial abnormality. Normal aeration of the mastoid air cells and paranasal sinuses. Atherosclerotic calcification in the bilateral cavernous carotid arteries.  IMPRESSION: 1. No acute intracranial abnormality. 2. Similar appearance of advanced chronic microvascular ischemic white matter disease. 3. Ex vacuo dilatation likely secondary to central atrophy without significant interval change. 4. Intracranial atherosclerosis.   Electronically Signed   By: Jacqulynn Cadet M.D.   On: 05/09/2014 15:35    Microbiology: Recent Results (from the past 240 hour(s))  URINE CULTURE     Status: None   Collection Time    05/09/14  1:50 PM      Result Value Ref  Range Status   Specimen Description URINE, CATHETERIZED   Final   Special Requests ADDED [id number]   Final   Culture  Setup Time     Final   Value: 05/09/2014 21:32     Performed at [facility]     Final   Value: NO GROWTH     Performed at [facility]   Culture     Final   Value: NO GROWTH     Performed at [facility]   Report Status 05/11/2014 FINAL   Final  CULTURE, BLOOD (ROUTINE X 2)     Status: None   Collection Time    05/09/14  9:26 PM      Result Value Ref Range Status   Specimen Description BLOOD LEFT HAND   Final   Special Requests     Final   Value: BOTTLES DRAWN AEROBIC AND ANAEROBIC 10CC AER,5CC ANA   Culture  Setup Time     Final   Value: 05/10/2014 04:22     Performed at [facility]   Culture     Final   Value:        BLOOD CULTURE RECEIVED NO GROWTH TO DATE CULTURE WILL BE HELD FOR 5 DAYS BEFORE ISSUING A FINAL NEGATIVE REPORT     Performed at [facility]   Report Status PENDING   Incomplete  CULTURE, BLOOD (ROUTINE X 2)     Status: None   Collection Time    05/09/14  9:42 PM      Result Value Ref Range Status   Specimen Description BLOOD LEFT ARM   Final   Special Requests BOTTLES DRAWN AEROBIC ONLY 5CC   Final   Culture  Setup Time     Final   Value: 05/10/2014 04:23     Performed at [facility]   Culture     Final   Value:        BLOOD CULTURE RECEIVED NO GROWTH TO DATE CULTURE WILL BE HELD FOR 5 DAYS BEFORE ISSUING A FINAL NEGATIVE REPORT     Performed at [facility]   Report Status PENDING   Incomplete  MRSA PCR SCREENING     Status: None   Collection Time    05/09/14 11:56 PM      Result Value Ref Range Status   MRSA by PCR NEGATIVE  NEGATIVE Final   Comment:            The GeneXpert MRSA Assay (FDA     approved for NASAL specimens     only), is one component of a     comprehensive MRSA colonization     surveillance program. It is not     intended to diagnose MRSA      infection nor to guide or     monitor treatment for     MRSA infections.     Labs: Basic Metabolic Panel:  Recent Labs Lab 05/09/14 1331 05/10/14 0525 05/11/14 0600 05/13/14 0336  NA 143 145 142 146  K 4.0 4.0 4.1 3.9  CL 103 107 104 106  CO2 27 24 25 28   GLUCOSE 108* 94 92 75  BUN 14 10 13 15   CREATININE 0.76 0.64 0.65 0.78  CALCIUM 9.3 9.2 9.0 9.0   Liver Function Tests:  Recent Labs Lab 05/09/14 1331  AST 33  ALT 15  ALKPHOS 65  BILITOT 0.4  PROT 7.6  ALBUMIN 3.4*   No results found for this basename: LIPASE, AMYLASE,  in the last 168 hours No results found for this basename: AMMONIA,  in the last 168 hours CBC:  Recent Labs Lab 05/09/14 1331 05/10/14 0525 05/11/14 0600 05/13/14 0336  WBC 5.6 5.1 4.3 3.6*  HGB 12.8 12.4 12.7 12.2  HCT 39.5 38.2 38.8 38.7  MCV 103.9* 103.0* 102.1* 103.2*  PLT 1016* 982* 981* 1109*   Cardiac Enzymes:  Recent Labs Lab 05/09/14 1333  TROPONINI <0.30   BNP: BNP (last 3 results)  Recent Labs  09/16/13 1730  PROBNP 619.3*   CBG:  Recent Labs Lab 05/09/14 1410 05/10/14 0700  GLUCAP 92 98       Signed:  Zetta Stoneman  [facility] 05/13/2014, 2:02 PM

## 2014-05-13 NOTE — Progress Notes (Signed)
ANTICOAGULATION CONSULT NOTE - Follow Up Consult  Pharmacy Consult for Coumadin Indication: DVT history / Afib  No Known Allergies  Labs:  Recent Labs  05/11/14 0600 05/12/14 0436 05/13/14 0336  HGB 12.7  --  12.2  HCT 38.8  --  38.7  PLT 981*  --  1109*  LABPROT 21.5* 22.3* 20.9*  INR 1.87* 1.96* 1.80*  CREATININE 0.65  --  0.78    Estimated Creatinine Clearance: 43.6 ml/min (by C-G formula based on Cr of 0.78).   Medications:  Scheduled:  . aspirin  81 mg Oral Daily  . atorvastatin  10 mg Oral Daily  . cefTRIAXone (ROCEPHIN)  IV  1 g Intravenous Q24H  . donepezil  10 mg Oral Daily  . hydroxyurea  1,000 mg Oral Daily  . levothyroxine  150 mcg Oral QAC breakfast  . losartan  75 mg Oral Daily  . memantine  5 mg Oral BID  . omega-3 acid ethyl esters  1 g Oral BID  . pantoprazole  20 mg Oral Daily  . saccharomyces boulardii  250 mg Oral BID  . senna-docusate  1 tablet Oral Daily  . sodium chloride  3 mL Intravenous Q12H  . Warfarin - Pharmacist Dosing Inpatient   Does not apply q1800    Assessment: 80yo female with Hx DVTs / now with Afib on Coumadin 5.5mg  daily at home.   INR = 1.8  Goal of Therapy:  INR 2-3 Monitor platelets by anticoagulation protocol: Yes   Plan:  Coumadin 7.5 mg po x 1 then 5.5 mg po daily Follow up AM labs if not discharged  Thank you. Anette Guarneri, PharmD [phone]

## 2014-05-15 ENCOUNTER — Non-Acute Institutional Stay (SKILLED_NURSING_FACILITY): Payer: Medicare Other | Admitting: Adult Health

## 2014-05-15 DIAGNOSIS — I4891 Unspecified atrial fibrillation: Secondary | ICD-10-CM

## 2014-05-15 DIAGNOSIS — I749 Embolism and thrombosis of unspecified artery: Secondary | ICD-10-CM

## 2014-05-15 DIAGNOSIS — F0390 Unspecified dementia without behavioral disturbance: Secondary | ICD-10-CM

## 2014-05-15 DIAGNOSIS — Z7901 Long term (current) use of anticoagulants: Secondary | ICD-10-CM

## 2014-05-15 DIAGNOSIS — I10 Essential (primary) hypertension: Secondary | ICD-10-CM

## 2014-05-15 DIAGNOSIS — N390 Urinary tract infection, site not specified: Secondary | ICD-10-CM

## 2014-05-15 DIAGNOSIS — D473 Essential (hemorrhagic) thrombocythemia: Secondary | ICD-10-CM

## 2014-05-16 LAB — CULTURE, BLOOD (ROUTINE X 2)
CULTURE: NO GROWTH
Culture: NO GROWTH

## 2014-05-17 ENCOUNTER — Encounter: Payer: Self-pay | Admitting: Adult Health

## 2014-05-17 DIAGNOSIS — I4891 Unspecified atrial fibrillation: Secondary | ICD-10-CM | POA: Insufficient documentation

## 2014-05-17 NOTE — Progress Notes (Signed)
Patient ID: Tanya Farmer, female   DOB: 07-01-1926, 80 y.o.   MRN: [id number]     [facility]  No Known Allergies   Chief Complaint  Patient presents with  . Hospitalization Follow-up    HPI:  She has been hospitalized for uti; new onset afib with rapid ventricular response. There is documentation of dehydration; however; in her lab work; I cannot find evidence of this as her renal labs are stable. Her urine culture in the hospital did not demonstrate any growth, she as taken to the hospital while on macrobid; she has been discharged on ceftin to be completed on 05-16-14. She was found to have afib with rvr. Her heart has returned back to normal; she is on long term coumadin therapy. She is a long term resident of this facility, and is unable to fully participate in the hpi or ros.   I have met with her family and the care plan team to discuss her frequent her uti's of which she has had 4 since December 2014. What nursing can do to further prevent her utis. We discussed her ability to participate with therapy and her ability to ambulate; we have developed a plan for her ambulation once she has completed her therapy. We discussed her dementia and her slow decline during this year.    Past Medical History  Diagnosis Date  . Hypertension   . Hyperlipidemia   . Thyroid disease     Hypothyroidism  . GERD (gastroesophageal reflux disease)   . Arthritis     Degenerative  . Peripheral vascular disease     Left   Fem-Pop and  tibial Thrombectomy  . High platelet count   . Dementia   . Pain in toe of right foot   . Hypothyroidism     Past Surgical History  Procedure Laterality Date  . Femoral-popliteal bypass graft  2002    Left  Dr. Sherren Mocha Early  . Abdominal hysterectomy    . Cataract surgery      left eye    VITAL SIGNS BP 128/71  Pulse 78  Ht 5' (1.524 m)  Wt 126 lb (57.153 kg)  BMI 24.61 kg/m2   Patient's Medications  New Prescriptions   No medications on file  Previous  Medications   ASPIRIN 81 MG CHEWABLE TABLET    Chew 1 tablet (81 mg total) by mouth daily.   ATORVASTATIN (LIPITOR) 10 MG TABLET    Take 10 mg by mouth daily.   CALCIUM-VITAMIN D (OSCAL WITH D) 500-200 MG-UNIT PER TABLET    Take 1 tablet by mouth 2 (two) times daily.   CEFUROXIME (CEFTIN) 250 MG TABLET    Take 1 tablet (250 mg total) by mouth 2 (two) times daily with a meal.   CRANBERRY 125 MG TABS    Take 250 mg by mouth daily.   DONEPEZIL (ARICEPT) 10 MG TABLET    Take 10 mg by mouth daily.    HYDROXYUREA (HYDREA) 500 MG CAPSULE    Take 1,000 mg by mouth daily. May take with food to minimize GI side effects.   LEVOTHYROXINE (SYNTHROID, LEVOTHROID) 150 MCG TABLET    Take 1 tablet (150 mcg total) by mouth daily.   LOSARTAN (COZAAR) 50 MG TABLET    Take 1.5 tablets (75 mg total) by mouth daily.   MEMANTINE (NAMENDA) 5 MG TABLET    Take 1 tablet (5 mg total) by mouth 2 (two) times daily.   MULTIPLE VITAMINS-MINERALS (MULTI COMPLETE PO)  Take 1 tablet by mouth daily.   OMEGA-3 FATTY ACIDS (FISH OIL) 1000 MG CAPS    Take 1 capsule (1,000 mg total) by mouth daily.   PANTOPRAZOLE (PROTONIX) 20 MG TABLET    Take 20 mg by mouth daily.   POLYETHYLENE GLYCOL (MIRALAX / GLYCOLAX) PACKET    Take 17 g by mouth daily as needed for mild constipation.   SACCHAROMYCES BOULARDII (FLORASTOR) 250 MG CAPSULE    Take 250 mg by mouth 2 (two) times daily. For 1 week   SENNA-DOCUSATE (SENOKOT-S) 8.6-50 MG PER TABLET    Take 1 tablet by mouth daily.   WARFARIN (COUMADIN) 2.5 MG TABLET    Take 2.5 mg by mouth daily. Take with 4 mg to equal 6.5 mg  Modified Medications   No medications on file  Discontinued Medications   No medications on file    SIGNIFICANT DIAGNOSTIC EXAMS  08-20-13: mri of brain Moderate motion artifact.  No evidence of acute infarct.  09-16-13: chest x-ray: Stable cardiomegaly and pulmonary venous congestion. Mild infiltrates developing in the lung bases cannot be excluded. This could be  secondary to mild pulmonary edema versus pneumonia.  01-22-14: ct of head: 1. No acute intracranial abnormality.2. Atrophy and chronic microvascular white matter ischemic changes.  01-22-14: chest x-ray: No acute abnormality.  Chronic cardiomegaly.  05-09-14; chest x-ray: 1. Low lung volumes with bibasilar (left greater than right) areas of atelectasis and/or consolidation. 2. Mild cardiomegaly.  05-09-14: ct of head: 1. No acute intracranial abnormality.2. Similar appearance of advanced chronic microvascular ischemic white matter disease. 3. Ex vacuo dilatation likely secondary to central atrophy without significant interval change. 4. Intracranial atherosclerosis.   LABS REVIEWED:  07-05-13: tsh 0.032; vit b12: 913; folate >20 09-16-13: wbc 6.1; hgb 14.1; hct 41.6; mcv 112.1; plt 731; glucose 168; bun 20; creat 0.78; k+4.0; na++143; t protein 8.6; albumin 3.7; BNP 619.3 Urine culture: p mirabilis:  09-18-13; wbc 5.6; hgb 12.8; hct 37.7; mcv 109.9; plt 576; glucose 98; bun 16; creat 0.78; k+3.7; na++139  10-28-13: wbc 4.1; hgb 10.8; hct 33.8; mcv 104; plt 162; glucose 80; bun 24; creat 1.0; k+4.1; na++142; liver normal albumin 3.5  01-01-14: glucose 83; bun 21; creat 0.8; k+3.9; na++143  01-03-14: urine culture: proteus mirabilis: augmentin 01-16-14: chol 137; ldl 71; trig 81; tsh 55.354  01-21-14: wbc 5.0; hgb 12.9; hct 40.7; mcv 112; plt 855; glucose 85; bun 16; creat 0.81; k+3.9; na++146 liver normal albumin 4.0; hgb a1c 5.7; tsh 34.630; thiamine 161.5; RPR: neg 01-22-14: wbc 4.7; hgb 12.6; hct 39.5; mcv 113.2; plt 794; glucose 106; bun 16; creat 0.78; k+3.5; na++146; tsh 43.420; free T3: 1.7; free T4: 0.86 01-26-14: urine culture: klebsiella pneumoniae treat with levaquin  02-14-14: tsh 17.397  03-31-14: tsh 35.249 04-29-14: tsh 1.48 05-02-14: tsh 1.38; free t3: 3.0; free t4: 2.1 05-04-14: urine culture: e-coli: macrobid.  05-09-14: wbc 5.6; hgb 12.8; hct 39.5; mcv 103.9; plt 1016; glucose 108; bun 14; creat  0.76; k+4.0; na++143; liver normal albumin 3.4; urine culture: no growth; tsh 1.260 05-11-14: wbc 4.3; hgb 12.7; hct 38.8; mcv 102.1; plt 981; glucose 92; bun 13; creat 0.65; k+4.1; na++142 05-13-14: wbc 3.6; hgb 12.2; hct 38.7; mcv 103.2; plt 1109; glucose 75; bun 14; creat 0.78; k+3.9; na++146      Review of Systems  Unable to perform ROS    Physical Exam  Constitutional: No distress.  thin  Neck: Neck supple. No JVD present.  Cardiovascular: Normal rate and intact distal pulses.  Heart rate regular  Respiratory: Effort normal and breath sounds normal. No respiratory distress. She has no wheezes.  GI: Soft. Bowel sounds are normal. She exhibits no distension. There is no tenderness.  Musculoskeletal: She exhibits no edema  Is able to move all extremities; is mainly in wheelchair at this time.    Neurological: She is alert.  Skin: Skin is warm and dry. She is not diaphoretic.    ASSESSMENT/PLAN  1. Embolism/anticoagulation management: will continue her coumadin 6.5 mg daily and will check inr in two weeks and will monitor her status    2. Essential thrombocytosis: she is stable will continue hydrea 1 gm daily; will monitor  3. Hypothyroidism; will continue her synthroid at 150 mcg daily; her tsh is presently normal  4. Hypertension: will continue  cozaar to 75 mg daily and will continue to monitor her status   5. Dyslipidemia: will continue lipitor 10 mg daily and fish oil 1 gm daily   6. Gerd: will continue protonix 20 mg daily   7. Insomnia: is stable is not on medications will monitor   8. Dementia: will continue her aricept 10 mg daily and namenda 5 mg twice daily and will monitor her status.   9. Constipation: will continue senna s twice daily   10. UTI: will complete her ceftin 250 mg twice daily through 05-16-14; she is on cranberry supplement; she is awaiting a consult with urology due to having 4 uit's since Dec of 2014.   11. Afib: this was transitory due to  infection and renal failure; her heart rate is stable; she is on long term coumadin therapy; will continue to monitor her status.     Time spent with patient 50 minutes.      [name] NP [facility]  Contact [phone] Monday through Friday 8am- 5pm  After hours call [phone]

## 2014-05-19 ENCOUNTER — Non-Acute Institutional Stay (SKILLED_NURSING_FACILITY): Payer: Medicare Other | Admitting: Internal Medicine

## 2014-05-19 ENCOUNTER — Encounter: Payer: Self-pay | Admitting: Internal Medicine

## 2014-05-19 DIAGNOSIS — R5381 Other malaise: Secondary | ICD-10-CM

## 2014-05-19 DIAGNOSIS — I4891 Unspecified atrial fibrillation: Secondary | ICD-10-CM

## 2014-05-19 DIAGNOSIS — F0390 Unspecified dementia without behavioral disturbance: Secondary | ICD-10-CM

## 2014-05-19 DIAGNOSIS — K5909 Other constipation: Secondary | ICD-10-CM

## 2014-05-19 DIAGNOSIS — E038 Other specified hypothyroidism: Secondary | ICD-10-CM

## 2014-05-19 DIAGNOSIS — K219 Gastro-esophageal reflux disease without esophagitis: Secondary | ICD-10-CM

## 2014-05-19 DIAGNOSIS — R791 Abnormal coagulation profile: Secondary | ICD-10-CM

## 2014-05-19 DIAGNOSIS — D473 Essential (hemorrhagic) thrombocythemia: Secondary | ICD-10-CM

## 2014-05-19 DIAGNOSIS — I10 Essential (primary) hypertension: Secondary | ICD-10-CM

## 2014-05-19 DIAGNOSIS — N390 Urinary tract infection, site not specified: Secondary | ICD-10-CM | POA: Insufficient documentation

## 2014-05-19 NOTE — Progress Notes (Signed)
Patient ID: Tanya Farmer, female   DOB: 1926/03/23, 80 y.o.   MRN: [id number]     Facility: [facility]    PCP: Blanchie Serve, MD  Code Status: full code  No Known Allergies  Chief Complaint: new admission  HPI:  80 y/o female patient is here for long term care. She was in the hospital from 05/09/14-05/13/14 with weakness and diagnosed to have UTI. She was also in afib with RVR and was started on iv cardizem after which she was bradycardic and her cardizem was stopped. She has now completed her antibiotics. She is seen in the room , sitting on her wheelchair. Her inr is checked today and is 3.6. Unable to obtain HPI or ROS from patient but she appears in no distress. No concern from staff. She has been walking with nursing staff and keeping herself hydrated. She has history of HTN, dementia, hypothyroidism, DVT on coumadin, GERD and recurrent uti  Review of Systems:  Unable to obtain     Past Medical History  Diagnosis Date  . Hypertension   . Hyperlipidemia   . Thyroid disease     Hypothyroidism  . GERD (gastroesophageal reflux disease)   . Arthritis     Degenerative  . Peripheral vascular disease     Left   Fem-Pop and  tibial Thrombectomy  . High platelet count   . Dementia   . Pain in toe of right foot   . Hypothyroidism    Past Surgical History  Procedure Laterality Date  . Femoral-popliteal bypass graft  2002    Left  Dr. Sherren Mocha Early  . Abdominal hysterectomy    . Cataract surgery      left eye   Social History:   reports that she has quit smoking. She has never used smokeless tobacco. She reports that she does not drink alcohol or use illicit drugs.  Family History  Problem Relation Age of Onset  . Heart failure Mother   . Stroke Father     Medications: Patient's Medications  New Prescriptions   No medications on file  Previous Medications   ASPIRIN 81 MG CHEWABLE TABLET    Chew 1 tablet (81 mg total) by mouth daily.   ATORVASTATIN (LIPITOR) 10 MG TABLET    Take 10 mg by mouth daily.   CALCIUM-VITAMIN D (OSCAL WITH D) 500-200 MG-UNIT PER TABLET    Take 1 tablet by mouth 2 (two) times daily.   CEFUROXIME (CEFTIN) 250 MG TABLET    Take 1 tablet (250 mg total) by mouth 2 (two) times daily with a meal.   CRANBERRY 125 MG TABS    Take 250 mg by mouth daily.   DONEPEZIL (ARICEPT) 10 MG TABLET    Take 10 mg by mouth daily.    HYDROXYUREA (HYDREA) 500 MG CAPSULE    Take 1,000 mg by mouth daily. May take with food to minimize GI side effects.   LEVOTHYROXINE (SYNTHROID, LEVOTHROID) 150 MCG TABLET    Take 1 tablet (150 mcg total) by mouth daily.   LOSARTAN (COZAAR) 50 MG TABLET    Take 1.5 tablets (75 mg total) by mouth daily.   MEMANTINE (NAMENDA) 5 MG TABLET    Take 1 tablet (5 mg total) by mouth 2 (two) times daily.   MULTIPLE VITAMINS-MINERALS (MULTI COMPLETE PO)    Take 1 tablet by mouth daily.   OMEGA-3 FATTY ACIDS (FISH OIL) 1000 MG CAPS    Take 1 capsule (1,000 mg total) by  mouth daily.   PANTOPRAZOLE (PROTONIX) 20 MG TABLET    Take 20 mg by mouth daily.   POLYETHYLENE GLYCOL (MIRALAX / GLYCOLAX) PACKET    Take 17 g by mouth daily as needed for mild constipation.   SACCHAROMYCES BOULARDII (FLORASTOR) 250 MG CAPSULE    Take 250 mg by mouth 2 (two) times daily. For 1 week   SENNA-DOCUSATE (SENOKOT-S) 8.6-50 MG PER TABLET    Take 1 tablet by mouth daily.   WARFARIN (COUMADIN) 2.5 MG TABLET    Take 2.5 mg by mouth daily. Take with 4 mg to equal 6.5 mg  Modified Medications   No medications on file  Discontinued Medications   No medications on file     Physical Exam: Filed Vitals:   05/19/14 1027  BP: 140/79  Pulse: 80  Temp: 97.3 F (36.3 C)  Resp: 20   General- elderly female in no acute distress Head- atraumatic, normocephalic Eyes- PERRLA, EOMI, no pallor, no icterus Neck- no lymphadenopathy, no thyromegaly Throat- moist mucus membrane Cardiovascular- irregular heart rate, no murmurs Respiratory-  bilateral clear to auscultation, no wheeze, no rhonchi, no crackles, no use of accessory muscles Abdomen- bowel sounds present, soft, non tender, no CVA tenderness Musculoskeletal- able to move all 4 extremities, walks with staff with some assistance without any assistive device, no leg edema, on wheelchair at present  Neurological- alert to self, has dementia Skin- warm and dry Psychiatry- normal mood and affect   Labs reviewed: Basic Metabolic Panel:  Recent Labs  05/10/14 0525 05/11/14 0600 05/13/14 0336  NA 145 142 146  K 4.0 4.1 3.9  CL 107 104 106  CO2 24 25 28   GLUCOSE 94 92 75  BUN 10 13 15   CREATININE 0.64 0.65 0.78  CALCIUM 9.2 9.0 9.0   Liver Function Tests:  Recent Labs  09/16/13 1440 09/17/13 0732 01/21/14 1123 05/09/14 1331  AST 32 27 29 33  ALT 19 15 23 15   ALKPHOS 63 55 54 65  BILITOT 0.4 0.5 0.3 0.4  PROT 8.6* 7.6 7.0 7.6  ALBUMIN 3.7 3.4*  --  3.4*   No results found for this basename: LIPASE, AMYLASE,  in the last 8760 hours No results found for this basename: AMMONIA,  in the last 8760 hours CBC:  Recent Labs  10/28/13 01/21/14 1123 01/22/14 1811  05/10/14 0525 05/11/14 0600 05/13/14 0336  WBC 4.1 5.0 4.7  < > 5.1 4.3 3.6*  NEUTROABS  --  3.5 3.2  --   --   --   --   HGB 10.8* 12.9 12.6  < > 12.4 12.7 12.2  HCT 34* 40.7 39.5  < > 38.2 38.8 38.7  MCV  --  112* 113.2*  < > 103.0* 102.1* 103.2*  PLT 162 855* 794*  < > 982* 981* 1109*  < > = values in this interval not displayed. Cardiac Enzymes:  Recent Labs  07/05/13 2028 07/05/13 2345 07/06/13 0430 05/09/14 1333  CKTOTAL 59 59 50  --   CKMB 1.5 1.5 1.4  --   TROPONINI <0.30 <0.30 <0.30 <0.30   BNP: No components found with this basename: POCBNP,  CBG:  Recent Labs  09/17/13 0800 05/09/14 1410 05/10/14 0700  GLUCAP 131* 92 98    Radiological Exams: Dg Chest 2 View  05/09/2014   CLINICAL DATA:  Altered mental status.  EXAM: CHEST  2 VIEW  COMPARISON:  Chest x-ray  01/22/2014.  FINDINGS: Lung volumes are low. Lateral views underpenetrated, but there  are bibasilar opacities (left greater than right), which may reflect areas of atelectasis and/or consolidation. No definite pleural effusion. Heart size is mildly enlarged. Upper mediastinal contours are within normal limits allowing for patient positioning. No evidence of pulmonary edema.  IMPRESSION: 1. Low lung volumes with bibasilar (left greater than right) areas of atelectasis and/or consolidation. 2. Mild cardiomegaly.   Electronically Signed   By: Vinnie Langton M.D.   On: 05/09/2014 15:00   Ct Head Wo Contrast  05/09/2014   CLINICAL DATA:  Altered mental status  EXAM: CT HEAD WITHOUT CONTRAST  TECHNIQUE: Contiguous axial images were obtained from the base of the skull through the vertex without intravenous contrast.  COMPARISON:  Prior head CT 01/22/2014  FINDINGS: Negative for acute intracranial hemorrhage, acute infarction, mass, mass effect, hydrocephalus or midline shift. Gray-white differentiation is preserved throughout. Similar appearance of advanced confluent periventricular and subcortical white matter hypoattenuation. While nonspecific, findings are most suggestive of chronic microvascular ischemic white matter disease. Mild mineralization in the bilateral basal ganglia. Ventriculomegaly is similar compared to prior and likely secondary to ex vacuo dilatation related to central atrophy. No focal scalp or calvarial abnormality. Normal aeration of the mastoid air cells and paranasal sinuses. Atherosclerotic calcification in the bilateral cavernous carotid arteries.  IMPRESSION: 1. No acute intracranial abnormality. 2. Similar appearance of advanced chronic microvascular ischemic white matter disease. 3. Ex vacuo dilatation likely secondary to central atrophy without significant interval change. 4. Intracranial atherosclerosis.   Electronically Signed   By: Jacqulynn Cadet M.D.   On: 05/09/2014 15:35    Assessment/Plan  Physical deconditioning Her recent infection, afib with RVR and progressive dementia have contributed to this. Will have her work with physical therapy and occupational therapy team to help with gait training and muscle strengthening exercises.fall precautions. Skin care. Encourage to be out of bed.   afib Rate currently controlled. On coumadin with goal inr 2-3.  supratherapeutic inr  inr supratherapeutic today, hold coumadin and recheck inr 05/20/14. No signs of bleeding  Dementia Progressive with decline anticipated. No behavioral changes reported. Monitor po intake, encourage fluid intake. Continue aricept and namenda  Recurrent cystitis Completed antibiotics. Continue cranberry supplement, pending urology consult  HTN Stable, continue cozaar 75 mg daily and aspirin  Constipation continue senna s twice daily and prn miralax  Essential thrombocytosis continue hydroxurea and monitor clinically.   Hypothyroidism Continue synthroid 150 mcg daily  Dyslipidemia continue lipitor 10 mg daily and fish oil 1 gm daily   Gerd continue protonix 20 mg daily    Family/ staff Communication: reviewed care plan with patient and nursing supervisor   Goals of care: long term care   Labs/tests ordered: cbc, cmp in a week    Blanchie Serve, MD  [facility] [phone] (Monday-Friday 8 am - 5 pm) [phone] (afterhours)

## 2014-05-26 ENCOUNTER — Encounter: Payer: Self-pay | Admitting: Adult Health

## 2014-05-26 ENCOUNTER — Non-Acute Institutional Stay (SKILLED_NURSING_FACILITY): Payer: Medicare Other | Admitting: Adult Health

## 2014-05-26 DIAGNOSIS — I4891 Unspecified atrial fibrillation: Secondary | ICD-10-CM

## 2014-05-26 DIAGNOSIS — I749 Embolism and thrombosis of unspecified artery: Secondary | ICD-10-CM

## 2014-05-26 DIAGNOSIS — Z7901 Long term (current) use of anticoagulants: Secondary | ICD-10-CM

## 2014-05-26 LAB — CBC AND DIFFERENTIAL
HCT: 36 % (ref 36–46)
Hemoglobin: 22.2 g/dL — AB (ref 12.0–16.0)
Platelets: 1193 10*3/uL — AB (ref 150–399)
WBC: 4.2 10*3/mL

## 2014-05-26 LAB — BASIC METABOLIC PANEL
BUN: 7 mg/dL (ref 4–21)
CREATININE: 0.8 mg/dL (ref 0.5–1.1)
Glucose: 78 mg/dL
Potassium: 4.5 mmol/L (ref 3.4–5.3)
SODIUM: 142 mmol/L (ref 137–147)

## 2014-05-26 NOTE — Progress Notes (Signed)
Patient ID: Tanya Farmer, female   DOB: 1926/03/01, 80 y.o.   MRN: [id number]     [facility]  No Known Allergies   Chief Complaint  Patient presents with  . Acute Visit    coumadin management     HPI:  She is being seen for her coumadin management her inr today is 1.9 and she is taking coumadin 5.5 mg daily. There are no reports of missed does present. She is unable to fully participate in the hpi or ros. She is tolerating her coumadin therapy without difficulty   Past Medical History  Diagnosis Date  . Hypertension   . Hyperlipidemia   . Thyroid disease     Hypothyroidism  . GERD (gastroesophageal reflux disease)   . Arthritis     Degenerative  . Peripheral vascular disease     Left   Fem-Pop and  tibial Thrombectomy  . High platelet count   . Dementia   . Pain in toe of right foot   . Hypothyroidism     Past Surgical History  Procedure Laterality Date  . Femoral-popliteal bypass graft  2002    Left  Dr. Sherren Mocha Early  . Abdominal hysterectomy    . Cataract surgery      left eye    VITAL SIGNS BP 119/75  Pulse 65  Ht 5\' 4"  (1.626 m)  Wt 130 lb (58.968 kg)  BMI 22.30 kg/m2      Patient's Medications  New Prescriptions   No medications on file  Previous Medications   ATORVASTATIN (LIPITOR) 10 MG TABLET    Take 10 mg by mouth daily.   CALCIUM-VITAMIN D (OSCAL WITH D) 500-200 MG-UNIT PER TABLET    Take 1 tablet by mouth 2 (two) times daily.   CRANBERRY 125 MG TABS    Take 250 mg by mouth daily.   DONEPEZIL (ARICEPT) 10 MG TABLET    Take 10 mg by mouth daily.    HYDROXYUREA (HYDREA) 500 MG CAPSULE    Take 1,000 mg by mouth daily. May take with food to minimize GI side effects.   LEVOTHYROXINE (SYNTHROID, LEVOTHROID) 150 MCG TABLET    Take 1 tablet (150 mcg total) by mouth daily.   LOSARTAN (COZAAR) 50 MG TABLET    Take 1.5 tablets (75 mg total) by mouth daily.   MEMANTINE (NAMENDA) 5 MG TABLET    Take 1 tablet (5 mg total) by mouth 2 (two) times daily.   MULTIPLE VITAMINS-MINERALS (MULTI COMPLETE PO)    Take 1 tablet by mouth daily.   OMEGA-3 FATTY ACIDS (FISH OIL) 1000 MG CAPS    Take 1 capsule (1,000 mg total) by mouth daily.   PANTOPRAZOLE (PROTONIX) 40 MG TABLET    Take 40 mg by mouth daily.   WARFARIN (COUMADIN) 2.5 MG TABLET    Take 2.5 mg by mouth daily. Take with 4 mg to equal 6.5 mg   WARFARIN (COUMADIN) 4 MG TABLET    Take 5.5 mg by mouth daily.   Modified Medications   No medications on file  Discontinued Medications   No medications on file    SIGNIFICANT DIAGNOSTIC EXAMS   08-20-13: mri of brain Moderate motion artifact.  No evidence of acute infarct.  09-16-13: chest x-ray: Stable cardiomegaly and pulmonary venous congestion. Mild infiltrates developing in the lung bases cannot be excluded. This could be secondary to mild pulmonary edema versus pneumonia.  01-22-14: ct of head: 1. No acute intracranial abnormality.2. Atrophy and chronic microvascular white matter  ischemic changes.  01-22-14: chest x-ray: No acute abnormality.  Chronic cardiomegaly.   LABS REVIEWED:  07-05-13: tsh 0.032; vit b12: 913; folate >20 09-16-13: wbc 6.1; hgb 14.1; hct 41.6; mcv 112.1; plt 731; glucose 168; bun 20; creat 0.78; k+4.0; na++143; t protein 8.6; albumin 3.7; BNP 619.3 Urine culture: p mirabilis:  09-18-13; wbc 5.6; hgb 12.8; hct 37.7; mcv 109.9; plt 576; glucose 98; bun 16; creat 0.78; k+3.7; na++139  10-28-13: wbc 4.1; hgb 10.8; hct 33.8; mcv 104; plt 162; glucose 80; bun 24; creat 1.0; k+4.1; na++142; liver normal albumin 3.5  01-01-14: glucose 83; bun 21; creat 0.8; k+3.9; na++143  01-03-14: urine culture: proteus mirabilis: augmentin 01-16-14: chol 137; ldl 71; trig 81; tsh 55.354  01-21-14: wbc 5.0; hgb 12.9; hct 40.7; mcv 112; plt 855; glucose 85; bun 16; creat 0.81; k+3.9; na++146 liver normal albumin 4.0; hgb a1c 5.7; tsh 34.630; thiamine 161.5; RPR: neg 01-22-14: wbc 4.7; hgb 12.6; hct 39.5; mcv 113.2; plt 794; glucose 106; bun 16; creat  0.78; k+3.5; na++146; tsh 43.420; free T3: 1.7; free T4: 0.86 01-26-14: urine culture: klebsiella pneumoniae treat with levaquin  02-14-14: tsh 17.397  03-31-14: tsh 35.249     Review of Systems  Unable to perform ROS    Physical Exam  Constitutional: No distress.  thin  Neck: Neck supple. No JVD present.  Cardiovascular: Normal rate and intact distal pulses.  Heart rate regular  Respiratory: Effort normal and breath sounds normal. No respiratory distress. She has no wheezes.  GI: Soft. Bowel sounds are normal. She exhibits no distension. There is no tenderness.  Musculoskeletal: She exhibits no edema  Is able to move all extremities; is mainly in wheelchair at this time.    Neurological: She is alert.  Skin: Skin is warm and dry. She is not diaphoretic.       ASSESSMENT/ PLAN:  1. Embolism/thombosis 2. Anticoagulation management She is on long term coumadin therapy. Her status is presently stable; for her inr of 1.9 will begin coumadin 5.5 mg daily and will  check inr on Thursday . Will monitor her status.     [name] NP [facility]  Contact [phone] Monday through Friday 8am- 5pm  After hours call [phone]

## 2014-05-26 NOTE — Progress Notes (Signed)
Patient ID: Tanya Farmer, female   DOB: [birth year], 80 y.o.   MRN: [id number]     [facility]  No Known Allergies   Chief Complaint  Patient presents with  . Acute Visit    coumadin management      HPI:  She is being seen today for the long term management of her coumadin therapy. She is on long term coumadin therapy for her embolism. She is tolerating her coumadin without difficulty. There are no reports of missed doses present. She is unable to fully participate in the hpi or ros. Her inr today is 2.7 and she is taking coumadin 5 mg daily   Past Medical History  Diagnosis Date  . Hypertension   . Hyperlipidemia   . Thyroid disease     Hypothyroidism  . GERD (gastroesophageal reflux disease)   . Arthritis     Degenerative  . Peripheral vascular disease     Left   Fem-Pop and  tibial Thrombectomy  . High platelet count   . Dementia   . Pain in toe of right foot   . Hypothyroidism     Past Surgical History  Procedure Laterality Date  . Femoral-popliteal bypass graft  2002    Left  Dr. Sherren Mocha Early  . Abdominal hysterectomy    . Cataract surgery      left eye    VITAL SIGNS BP 110/57  Pulse 68  Ht 5\' 4"  (1.626 m)  Wt 132 lb (59.875 kg)  BMI 22.65 kg/m2    Patient's Medications  New Prescriptions   No medications on file  Previous Medications   ATORVASTATIN (LIPITOR) 10 MG TABLET    Take 10 mg by mouth daily.   CALCIUM-VITAMIN D (OSCAL WITH D) 500-200 MG-UNIT PER TABLET    Take 1 tablet by mouth 2 (two) times daily.   CRANBERRY 125 MG TABS    Take 250 mg by mouth daily.   DONEPEZIL (ARICEPT) 10 MG TABLET    Take 10 mg by mouth daily.    HYDROXYUREA (HYDREA) 500 MG CAPSULE    Take 1,000 mg by mouth daily. May take with food to minimize GI side effects.   LEVOTHYROXINE (SYNTHROID, LEVOTHROID) 112 MCG TABLET    Take 1 tablet (112 mcg total) by mouth daily.   LOSARTAN (COZAAR) 50 MG TABLET    Take 1.5 tablets (75 mg total) by mouth daily.   MEMANTINE (NAMENDA) 5  MG TABLET    Take 1 tablet (5 mg total) by mouth 2 (two) times daily.   MULTIPLE VITAMINS-MINERALS (MULTI COMPLETE PO)    Take 1 tablet by mouth daily.   OMEGA-3 FATTY ACIDS (FISH OIL) 1000 MG CAPS    Take 1 capsule (1,000 mg total) by mouth daily.   PANTOPRAZOLE (PROTONIX) 40 MG TABLET    Take 40 mg by mouth daily.   WARFARIN (COUMADIN) 2.5 MG TABLET    Take 2.5 mg by mouth daily. Take with 4 mg to equal 6.5 mg   WARFARIN (COUMADIN) 4 MG TABLET    Take 5 mg by mouth daily.   Modified Medications   No medications on file  Discontinued Medications   No medications on file    SIGNIFICANT DIAGNOSTIC EXAMS  08-20-13: mri of brain Moderate motion artifact.  No evidence of acute infarct.  09-16-13: chest x-ray: Stable cardiomegaly and pulmonary venous congestion. Mild infiltrates developing in the lung bases cannot be excluded. This could be secondary to mild pulmonary edema versus pneumonia.  01-22-14:  ct of head: 1. No acute intracranial abnormality.2. Atrophy and chronic microvascular white matter ischemic changes.  01-22-14: chest x-ray: No acute abnormality.  Chronic cardiomegaly.   LABS REVIEWED:  07-05-13: tsh 0.032; vit b12: 913; folate >20 09-16-13: wbc 6.1; hgb 14.1; hct 41.6; mcv 112.1; plt 731; glucose 168; bun 20; creat 0.78; k+4.0; na++143; t protein 8.6; albumin 3.7; BNP 619.3 Urine culture: p mirabilis:  09-18-13; wbc 5.6; hgb 12.8; hct 37.7; mcv 109.9; plt 576; glucose 98; bun 16; creat 0.78; k+3.7; na++139  10-28-13: wbc 4.1; hgb 10.8; hct 33.8; mcv 104; plt 162; glucose 80; bun 24; creat 1.0; k+4.1; na++142; liver normal albumin 3.5  01-01-14: glucose 83; bun 21; creat 0.8; k+3.9; na++143  01-03-14: urine culture: proteus mirabilis: augmentin 01-16-14: chol 137; ldl 71; trig 81; tsh 55.354  01-21-14: wbc 5.0; hgb 12.9; hct 40.7; mcv 112; plt 855; glucose 85; bun 16; creat 0.81; k+3.9; na++146 liver normal albumin 4.0; hgb a1c 5.7; tsh 34.630; thiamine 161.5; RPR: neg 01-22-14: wbc  4.7; hgb 12.6; hct 39.5; mcv 113.2; plt 794; glucose 106; bun 16; creat 0.78; k+3.5; na++146; tsh 43.420; free T3: 1.7; free T4: 0.86 01-26-14: urine culture: klebsiella pneumoniae treat with levaquin  02-14-14: tsh 17.397      Review of Systems  Unable to perform ROS    Physical Exam  Constitutional: No distress.  thin  Neck: Neck supple. No JVD present.  Cardiovascular: Normal rate and intact distal pulses.  Heart rate regular  Respiratory: Effort normal and breath sounds normal. No respiratory distress. She has no wheezes.  GI: Soft. Bowel sounds are normal. She exhibits no distension. There is no tenderness.  Musculoskeletal: She exhibits no edema  Is able to move all extremities; ambulates with staff   Neurological: She is alert.  Skin: Skin is warm and dry. She is not diaphoretic.       ASSESSMENT/ PLAN:  1. Embolism/thrombosis 2. Chronic anticoagulation management She is presently stable; will continue her long term coumadin therapy. For her inr of 2.7 will continue coumadin 5 mg daily and will check inr in one week.     [name] NP [facility]  Contact [phone] Monday through Friday 8am- 5pm  After hours call [phone]

## 2014-05-26 NOTE — Progress Notes (Signed)
Patient ID: Tanya Farmer, female   DOB: May 29, 1926, 80 y.o.   MRN: [id number]     [facility]  No Known Allergies   Chief Complaint  Patient presents with  . 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 She has recently been diagnosed with afib as well. She is tolerating her coumadin therapy without difficulty. There are no reports of missed doses present. She is unable to fully participate in the hpi or ros.    Past Medical History  Diagnosis Date  . Hypertension   . Hyperlipidemia   . Thyroid disease     Hypothyroidism  . GERD (gastroesophageal reflux disease)   . Arthritis     Degenerative  . Peripheral vascular disease     Left   Fem-Pop and  tibial Thrombectomy  . High platelet count   . Dementia   . Pain in toe of right foot   . Hypothyroidism     Past Surgical History  Procedure Laterality Date  . Femoral-popliteal bypass graft  2002    Left  Dr. Sherren Mocha Early  . Abdominal hysterectomy    . Cataract surgery      left eye    VITAL SIGNS BP 119/80  Pulse 79  Ht 5\' 4"  (1.626 m)  Wt 130 lb (58.968 kg)  BMI 22.30 kg/m2   Patient's Medications  New Prescriptions   No medications on file  Previous Medications   ASPIRIN 81 MG CHEWABLE TABLET    Chew 1 tablet (81 mg total) by mouth daily.   ATORVASTATIN (LIPITOR) 10 MG TABLET    Take 10 mg by mouth daily.   CALCIUM-VITAMIN D (OSCAL WITH D) 500-200 MG-UNIT PER TABLET    Take 1 tablet by mouth 2 (two) times daily.   CRANBERRY 125 MG TABS    Take 250 mg by mouth daily.   DONEPEZIL (ARICEPT) 10 MG TABLET    Take 10 mg by mouth daily.    HYDROXYUREA (HYDREA) 500 MG CAPSULE    Take 1,000 mg by mouth daily. May take with food to minimize GI side effects.   LEVOTHYROXINE (SYNTHROID, LEVOTHROID) 150 MCG TABLET    Take 1 tablet (150 mcg total) by mouth daily.   LOSARTAN (COZAAR) 50 MG TABLET    Take 1.5 tablets (75 mg total) by mouth daily.   MEMANTINE  (NAMENDA) 5 MG TABLET    Take 1 tablet (5 mg total) by mouth 2 (two) times daily.   MULTIPLE VITAMINS-MINERALS (MULTI COMPLETE PO)    Take 1 tablet by mouth daily.   OMEGA-3 FATTY ACIDS (FISH OIL) 1000 MG CAPS    Take 1 capsule (1,000 mg total) by mouth daily.   PANTOPRAZOLE (PROTONIX) 20 MG TABLET    Take 20 mg by mouth daily.   POLYETHYLENE GLYCOL (MIRALAX / GLYCOLAX) PACKET    Take 17 g by mouth daily as needed for mild constipation.   SACCHAROMYCES BOULARDII (FLORASTOR) 250 MG CAPSULE    Take 250 mg by mouth 2 (two) times daily. For 1 week   SENNA-DOCUSATE (SENOKOT-S) 8.6-50 MG PER TABLET    Take 1 tablet by mouth daily.   WARFARIN (COUMADIN) 2.5 MG TABLET    Take 2.5 mg by mouth daily at 6 PM. TAKE 6 MG DAILY  Modified Medications   No medications on file  Discontinued Medications   CEFUROXIME (CEFTIN) 250 MG TABLET    Take 1  tablet (250 mg total) by mouth 2 (two) times daily with a meal.    SIGNIFICANT DIAGNOSTIC EXAMS   08-20-13: mri of brain Moderate motion artifact.  No evidence of acute infarct.  09-16-13: chest x-ray: Stable cardiomegaly and pulmonary venous congestion. Mild infiltrates developing in the lung bases cannot be excluded. This could be secondary to mild pulmonary edema versus pneumonia.  01-22-14: ct of head: 1. No acute intracranial abnormality.2. Atrophy and chronic microvascular white matter ischemic changes.  01-22-14: chest x-ray: No acute abnormality.  Chronic cardiomegaly.  05-09-14; chest x-ray: 1. Low lung volumes with bibasilar (left greater than right) areas of atelectasis and/or consolidation. 2. Mild cardiomegaly.  05-09-14: ct of head: 1. No acute intracranial abnormality.2. Similar appearance of advanced chronic microvascular ischemic white matter disease. 3. Ex vacuo dilatation likely secondary to central atrophy without significant interval change. 4. Intracranial atherosclerosis.   LABS REVIEWED:  07-05-13: tsh 0.032; vit b12: 913; folate  >20 09-16-13: wbc 6.1; hgb 14.1; hct 41.6; mcv 112.1; plt 731; glucose 168; bun 20; creat 0.78; k+4.0; na++143; t protein 8.6; albumin 3.7; BNP 619.3 Urine culture: p mirabilis:  09-18-13; wbc 5.6; hgb 12.8; hct 37.7; mcv 109.9; plt 576; glucose 98; bun 16; creat 0.78; k+3.7; na++139  10-28-13: wbc 4.1; hgb 10.8; hct 33.8; mcv 104; plt 162; glucose 80; bun 24; creat 1.0; k+4.1; na++142; liver normal albumin 3.5  01-01-14: glucose 83; bun 21; creat 0.8; k+3.9; na++143  01-03-14: urine culture: proteus mirabilis: augmentin 01-16-14: chol 137; ldl 71; trig 81; tsh 55.354  01-21-14: wbc 5.0; hgb 12.9; hct 40.7; mcv 112; plt 855; glucose 85; bun 16; creat 0.81; k+3.9; na++146 liver normal albumin 4.0; hgb a1c 5.7; tsh 34.630; thiamine 161.5; RPR: neg 01-22-14: wbc 4.7; hgb 12.6; hct 39.5; mcv 113.2; plt 794; glucose 106; bun 16; creat 0.78; k+3.5; na++146; tsh 43.420; free T3: 1.7; free T4: 0.86 01-26-14: urine culture: klebsiella pneumoniae treat with levaquin  02-14-14: tsh 17.397  03-31-14: tsh 35.249 04-29-14: tsh 1.48 05-02-14: tsh 1.38; free t3: 3.0; free t4: 2.1 05-04-14: urine culture: e-coli: macrobid.  05-09-14: wbc 5.6; hgb 12.8; hct 39.5; mcv 103.9; plt 1016; glucose 108; bun 14; creat 0.76; k+4.0; na++143; liver normal albumin 3.4; urine culture: no growth; tsh 1.260 05-11-14: wbc 4.3; hgb 12.7; hct 38.8; mcv 102.1; plt 981; glucose 92; bun 13; creat 0.65; k+4.1; na++142 05-13-14: wbc 3.6; hgb 12.2; hct 38.7; mcv 103.2; plt 1109; glucose 75; bun 14; creat 0.78; k+3.9; na++146      Review of Systems  Unable to perform ROS    Physical Exam  Constitutional: No distress.  thin  Neck: Neck supple. No JVD present.  Cardiovascular: Normal rate and intact distal pulses.  Heart rate regular  Respiratory: Effort normal and breath sounds normal. No respiratory distress. She has no wheezes.  GI: Soft. Bowel sounds are normal. She exhibits no distension. There is no tenderness.  Musculoskeletal: She exhibits  no edema  Is able to move all extremities; is mainly in wheelchair at this time.    Neurological: She is alert.  Skin: Skin is warm and dry. She is not diaphoretic.       ASSESSMENT/ PLAN:  1. afib 2. Embolism/thrombus 3. Anticoagulation management She is presently stable for her inr of 3.7; will hold her coumadin today; will then begin coumadin 5.5 mg daily and will check inr in one week and will monitor her status.

## 2014-05-26 NOTE — Progress Notes (Signed)
Patient ID: Tanya Farmer, female   DOB: [birth year], 80 y.o.   MRN: [id number]     [facility]  No Known Allergies   Chief Complaint  Patient presents with  . Acute Visit    coumadin management      HPI:  She is being seen today for the long term management of her coumadin therapy. She is on long term coumadin therapy for her embolism. She is tolerating her coumadin without difficulty. There are no reports of missed doses present. She is unable to fully participate in the hpi or ros. Her inr today is 2.5 and she is taking coumadin 5 mg daily   Past Medical History  Diagnosis Date  . Hypertension   . Hyperlipidemia   . Thyroid disease     Hypothyroidism  . GERD (gastroesophageal reflux disease)   . Arthritis     Degenerative  . Peripheral vascular disease     Left   Fem-Pop and  tibial Thrombectomy  . High platelet count   . Dementia   . Pain in toe of right foot   . Hypothyroidism     Past Surgical History  Procedure Laterality Date  . Femoral-popliteal bypass graft  2002    Left  Dr. Sherren Mocha Early  . Abdominal hysterectomy    . Cataract surgery      left eye    VITAL SIGNS BP 110/57  Pulse 68  Ht 5\' 4"  (1.626 m)  Wt 132 lb (59.875 kg)  BMI 22.65 kg/m2    Patient's Medications  New Prescriptions   No medications on file  Previous Medications   ATORVASTATIN (LIPITOR) 10 MG TABLET    Take 10 mg by mouth daily.   CALCIUM-VITAMIN D (OSCAL WITH D) 500-200 MG-UNIT PER TABLET    Take 1 tablet by mouth 2 (two) times daily.   CRANBERRY 125 MG TABS    Take 250 mg by mouth daily.   DONEPEZIL (ARICEPT) 10 MG TABLET    Take 10 mg by mouth daily.    HYDROXYUREA (HYDREA) 500 MG CAPSULE    Take 1,000 mg by mouth daily. May take with food to minimize GI side effects.   LEVOTHYROXINE (SYNTHROID, LEVOTHROID) 112 MCG TABLET    Take 1 tablet (112 mcg total) by mouth daily.   LOSARTAN (COZAAR) 50 MG TABLET    Take 1.5 tablets (75 mg total) by mouth daily.   MEMANTINE (NAMENDA) 5  MG TABLET    Take 1 tablet (5 mg total) by mouth 2 (two) times daily.   MULTIPLE VITAMINS-MINERALS (MULTI COMPLETE PO)    Take 1 tablet by mouth daily.   OMEGA-3 FATTY ACIDS (FISH OIL) 1000 MG CAPS    Take 1 capsule (1,000 mg total) by mouth daily.   PANTOPRAZOLE (PROTONIX) 40 MG TABLET    Take 40 mg by mouth daily.   WARFARIN (COUMADIN) 2.5 MG TABLET    Take 2.5 mg by mouth daily. Take with 4 mg to equal 6.5 mg   WARFARIN (COUMADIN) 4 MG TABLET    Take 5 mg by mouth daily.   Modified Medications   No medications on file  Discontinued Medications   No medications on file    SIGNIFICANT DIAGNOSTIC EXAMS  08-20-13: mri of brain Moderate motion artifact.  No evidence of acute infarct.  09-16-13: chest x-ray: Stable cardiomegaly and pulmonary venous congestion. Mild infiltrates developing in the lung bases cannot be excluded. This could be secondary to mild pulmonary edema versus pneumonia.  01-22-14:  ct of head: 1. No acute intracranial abnormality.2. Atrophy and chronic microvascular white matter ischemic changes.  01-22-14: chest x-ray: No acute abnormality.  Chronic cardiomegaly.   LABS REVIEWED:  07-05-13: tsh 0.032; vit b12: 913; folate >20 09-16-13: wbc 6.1; hgb 14.1; hct 41.6; mcv 112.1; plt 731; glucose 168; bun 20; creat 0.78; k+4.0; na++143; t protein 8.6; albumin 3.7; BNP 619.3 Urine culture: p mirabilis:  09-18-13; wbc 5.6; hgb 12.8; hct 37.7; mcv 109.9; plt 576; glucose 98; bun 16; creat 0.78; k+3.7; na++139  10-28-13: wbc 4.1; hgb 10.8; hct 33.8; mcv 104; plt 162; glucose 80; bun 24; creat 1.0; k+4.1; na++142; liver normal albumin 3.5  01-01-14: glucose 83; bun 21; creat 0.8; k+3.9; na++143  01-03-14: urine culture: proteus mirabilis: augmentin 01-16-14: chol 137; ldl 71; trig 81; tsh 55.354  01-21-14: wbc 5.0; hgb 12.9; hct 40.7; mcv 112; plt 855; glucose 85; bun 16; creat 0.81; k+3.9; na++146 liver normal albumin 4.0; hgb a1c 5.7; tsh 34.630; thiamine 161.5; RPR: neg 01-22-14: wbc  4.7; hgb 12.6; hct 39.5; mcv 113.2; plt 794; glucose 106; bun 16; creat 0.78; k+3.5; na++146; tsh 43.420; free T3: 1.7; free T4: 0.86 01-26-14: urine culture: klebsiella pneumoniae treat with levaquin  02-14-14: tsh 17.397      Review of Systems  Unable to perform ROS    Physical Exam  Constitutional: No distress.  thin  Neck: Neck supple. No JVD present.  Cardiovascular: Normal rate and intact distal pulses.  Heart rate regular  Respiratory: Effort normal and breath sounds normal. No respiratory distress. She has no wheezes.  GI: Soft. Bowel sounds are normal. She exhibits no distension. There is no tenderness.  Musculoskeletal: She exhibits no edema  Is able to move all extremities;  Neurological: She is alert.  Skin: Skin is warm and dry. She is not diaphoretic.       ASSESSMENT/ PLAN:  1. Embolism/thrombosis 2. Chronic anticoagulation management She is presently stable; will continue her long term coumadin therapy. For her inr of 2.5 will continue coumadin 5 mg daily and will check inr in one week.     [name] NP [facility]  Contact [phone] Monday through Friday 8am- 5pm  After hours call [phone]

## 2014-05-26 NOTE — Progress Notes (Signed)
Patient ID: Tanya Farmer, female   DOB: 07-Sep-1925, 80 y.o.   MRN: [id number]     [facility]  No Known Allergies   Chief Complaint  Patient presents with  . Acute Visit    coumadin management       HPI:  She is being seen today for the long term management of her coumadin therapy. She is on long term coumadin therapy for her embolism. She is tolerating her coumadin without difficulty. There are no reports of missed doses present. She is unable to fully participate in the hpi or ros. Her inr today is 1.9 and she is taking coumadin 5 mg daily   Past Medical History  Diagnosis Date  . Hypertension   . Hyperlipidemia   . Thyroid disease     Hypothyroidism  . GERD (gastroesophageal reflux disease)   . Arthritis     Degenerative  . Peripheral vascular disease     Left   Fem-Pop and  tibial Thrombectomy  . High platelet count   . Dementia   . Pain in toe of right foot   . Hypothyroidism     Past Surgical History  Procedure Laterality Date  . Femoral-popliteal bypass graft  2002    Left  Dr. Sherren Mocha Early  . Abdominal hysterectomy    . Cataract surgery      left eye    VITAL SIGNS BP 110/57  Pulse 68  Ht 5\' 4"  (1.626 m)  Wt 132 lb (59.875 kg)  BMI 22.65 kg/m2    Patient's Medications  New Prescriptions   No medications on file  Previous Medications   ATORVASTATIN (LIPITOR) 10 MG TABLET    Take 10 mg by mouth daily.   CALCIUM-VITAMIN D (OSCAL WITH D) 500-200 MG-UNIT PER TABLET    Take 1 tablet by mouth 2 (two) times daily.   CRANBERRY 125 MG TABS    Take 250 mg by mouth daily.   DONEPEZIL (ARICEPT) 10 MG TABLET    Take 10 mg by mouth daily.    HYDROXYUREA (HYDREA) 500 MG CAPSULE    Take 1,000 mg by mouth daily. May take with food to minimize GI side effects.   LEVOTHYROXINE (SYNTHROID, LEVOTHROID) 112 MCG TABLET    Take 1 tablet (112 mcg total) by mouth daily.   LOSARTAN (COZAAR) 50 MG TABLET    Take 1.5 tablets (75 mg total) by mouth daily.   MEMANTINE (NAMENDA)  5 MG TABLET    Take 1 tablet (5 mg total) by mouth 2 (two) times daily.   MULTIPLE VITAMINS-MINERALS (MULTI COMPLETE PO)    Take 1 tablet by mouth daily.   OMEGA-3 FATTY ACIDS (FISH OIL) 1000 MG CAPS    Take 1 capsule (1,000 mg total) by mouth daily.   PANTOPRAZOLE (PROTONIX) 40 MG TABLET    Take 40 mg by mouth daily.   WARFARIN (COUMADIN) 2.5 MG TABLET    Take 2.5 mg by mouth daily. Take with 4 mg to equal 6.5 mg   WARFARIN (COUMADIN) 4 MG TABLET    Take 5 mg by mouth daily.   Modified Medications   No medications on file  Discontinued Medications   No medications on file    SIGNIFICANT DIAGNOSTIC EXAMS  08-20-13: mri of brain Moderate motion artifact.  No evidence of acute infarct.  09-16-13: chest x-ray: Stable cardiomegaly and pulmonary venous congestion. Mild infiltrates developing in the lung bases cannot be excluded. This could be secondary to mild pulmonary edema versus pneumonia.  01-22-14: ct of head: 1. No acute intracranial abnormality.2. Atrophy and chronic microvascular white matter ischemic changes.  01-22-14: chest x-ray: No acute abnormality.  Chronic cardiomegaly.   LABS REVIEWED:  07-05-13: tsh 0.032; vit b12: 913; folate >20 09-16-13: wbc 6.1; hgb 14.1; hct 41.6; mcv 112.1; plt 731; glucose 168; bun 20; creat 0.78; k+4.0; na++143; t protein 8.6; albumin 3.7; BNP 619.3 Urine culture: p mirabilis:  09-18-13; wbc 5.6; hgb 12.8; hct 37.7; mcv 109.9; plt 576; glucose 98; bun 16; creat 0.78; k+3.7; na++139  10-28-13: wbc 4.1; hgb 10.8; hct 33.8; mcv 104; plt 162; glucose 80; bun 24; creat 1.0; k+4.1; na++142; liver normal albumin 3.5  01-01-14: glucose 83; bun 21; creat 0.8; k+3.9; na++143  01-03-14: urine culture: proteus mirabilis: augmentin 01-16-14: chol 137; ldl 71; trig 81; tsh 55.354  01-21-14: wbc 5.0; hgb 12.9; hct 40.7; mcv 112; plt 855; glucose 85; bun 16; creat 0.81; k+3.9; na++146 liver normal albumin 4.0; hgb a1c 5.7; tsh 34.630; thiamine 161.5; RPR: neg 01-22-14: wbc  4.7; hgb 12.6; hct 39.5; mcv 113.2; plt 794; glucose 106; bun 16; creat 0.78; k+3.5; na++146; tsh 43.420; free T3: 1.7; free T4: 0.86 01-26-14: urine culture: klebsiella pneumoniae treat with levaquin  02-14-14: tsh 17.397      Review of Systems  Unable to perform ROS    Physical Exam  Constitutional: No distress.  thin  Neck: Neck supple. No JVD present.  Cardiovascular: Normal rate and intact distal pulses.  Heart rate regular  Respiratory: Effort normal and breath sounds normal. No respiratory distress. She has no wheezes.  GI: Soft. Bowel sounds are normal. She exhibits no distension. There is no tenderness.  Musculoskeletal: She exhibits no edema  Is able to move all extremities; ambulates with staff   Neurological: She is alert.  Skin: Skin is warm and dry. She is not diaphoretic.       ASSESSMENT/ PLAN:  1. Embolism/thrombosis 2. Chronic anticoagulation management She is presently stable; will continue her long term coumadin therapy. For her inr of 1.9 will continue coumadin 5 mg daily and will check inr in one week.     [name] NP [facility]  Contact [phone] Monday through Friday 8am- 5pm  After hours call [phone]

## 2014-05-26 NOTE — Progress Notes (Signed)
Patient ID: Tanya Farmer, female   DOB: [birth year], 80 y.o.   MRN: [id number]     [facility]  No Known Allergies   Chief Complaint  Patient presents with  . Acute Visit    coumadin management     HPI:  She is being seen today for the long term management of her coumadin therapy. She is on long term coumadin therapy for her embolism. She is tolerating her coumadin without difficulty. There are no reports of missed doses present. She is unable to fully participate in the hpi or ros.   Past Medical History  Diagnosis Date  . Hypertension   . Hyperlipidemia   . Thyroid disease     Hypothyroidism  . GERD (gastroesophageal reflux disease)   . Arthritis     Degenerative  . Peripheral vascular disease     Left   Fem-Pop and  tibial Thrombectomy  . High platelet count   . Dementia   . Pain in toe of right foot   . Hypothyroidism     Past Surgical History  Procedure Laterality Date  . Femoral-popliteal bypass graft  2002    Left  Dr. Sherren Mocha Early  . Abdominal hysterectomy    . Cataract surgery      left eye    VITAL SIGNS BP 110/57  Pulse 68  Ht 5\' 4"  (1.626 m)  Wt 132 lb (59.875 kg)  BMI 22.65 kg/m2    Patient's Medications  New Prescriptions   No medications on file  Previous Medications   ATORVASTATIN (LIPITOR) 10 MG TABLET    Take 10 mg by mouth daily.   CALCIUM-VITAMIN D (OSCAL WITH D) 500-200 MG-UNIT PER TABLET    Take 1 tablet by mouth 2 (two) times daily.   CRANBERRY 125 MG TABS    Take 250 mg by mouth daily.   DONEPEZIL (ARICEPT) 10 MG TABLET    Take 10 mg by mouth daily.    HYDROXYUREA (HYDREA) 500 MG CAPSULE    Take 1,000 mg by mouth daily. May take with food to minimize GI side effects.   LEVOTHYROXINE (SYNTHROID, LEVOTHROID) 112 MCG TABLET    Take 1 tablet (112 mcg total) by mouth daily.   LOSARTAN (COZAAR) 50 MG TABLET    Take 1.5 tablets (75 mg total) by mouth daily.   MEMANTINE (NAMENDA) 5 MG TABLET    Take 1 tablet (5 mg total) by mouth 2 (two)  times daily.   MULTIPLE VITAMINS-MINERALS (MULTI COMPLETE PO)    Take 1 tablet by mouth daily.   OMEGA-3 FATTY ACIDS (FISH OIL) 1000 MG CAPS    Take 1 capsule (1,000 mg total) by mouth daily.   PANTOPRAZOLE (PROTONIX) 40 MG TABLET    Take 40 mg by mouth daily.   WARFARIN (COUMADIN) 2.5 MG TABLET    Take 2.5 mg by mouth daily. Take with 4 mg to equal 6.5 mg   WARFARIN (COUMADIN) 4 MG TABLET    Take 5 mg by mouth daily.   Modified Medications   No medications on file  Discontinued Medications   No medications on file    SIGNIFICANT DIAGNOSTIC EXAMS  08-20-13: mri of brain Moderate motion artifact.  No evidence of acute infarct.  09-16-13: chest x-ray: Stable cardiomegaly and pulmonary venous congestion. Mild infiltrates developing in the lung bases cannot be excluded. This could be secondary to mild pulmonary edema versus pneumonia.  01-22-14: ct of head: 1. No acute intracranial abnormality.2. Atrophy and chronic microvascular white matter  ischemic changes.  01-22-14: chest x-ray: No acute abnormality.  Chronic cardiomegaly.   LABS REVIEWED:  07-05-13: tsh 0.032; vit b12: 913; folate >20 09-16-13: wbc 6.1; hgb 14.1; hct 41.6; mcv 112.1; plt 731; glucose 168; bun 20; creat 0.78; k+4.0; na++143; t protein 8.6; albumin 3.7; BNP 619.3 Urine culture: p mirabilis:  09-18-13; wbc 5.6; hgb 12.8; hct 37.7; mcv 109.9; plt 576; glucose 98; bun 16; creat 0.78; k+3.7; na++139  10-28-13: wbc 4.1; hgb 10.8; hct 33.8; mcv 104; plt 162; glucose 80; bun 24; creat 1.0; k+4.1; na++142; liver normal albumin 3.5  01-01-14: glucose 83; bun 21; creat 0.8; k+3.9; na++143  01-03-14: urine culture: proteus mirabilis: augmentin 01-16-14: chol 137; ldl 71; trig 81; tsh 55.354  01-21-14: wbc 5.0; hgb 12.9; hct 40.7; mcv 112; plt 855; glucose 85; bun 16; creat 0.81; k+3.9; na++146 liver normal albumin 4.0; hgb a1c 5.7; tsh 34.630; thiamine 161.5; RPR: neg 01-22-14: wbc 4.7; hgb 12.6; hct 39.5; mcv 113.2; plt 794; glucose 106;  bun 16; creat 0.78; k+3.5; na++146; tsh 43.420; free T3: 1.7; free T4: 0.86 01-26-14: urine culture: klebsiella pneumoniae treat with levaquin  02-14-14: tsh 17.397      Review of Systems  Unable to perform ROS    Physical Exam  Constitutional: No distress.  thin  Neck: Neck supple. No JVD present.  Cardiovascular: Normal rate and intact distal pulses.  Heart rate regular  Respiratory: Effort normal and breath sounds normal. No respiratory distress. She has no wheezes.  GI: Soft. Bowel sounds are normal. She exhibits no distension. There is no tenderness.  Musculoskeletal: She exhibits no edema  Is able to move all extremities; ambulates with staff   Neurological: She is alert.  Skin: Skin is warm and dry. She is not diaphoretic.       ASSESSMENT/ PLAN:  1. Embolism/thrombosis 2. Chronic anticoagulation management She is presently stable; will continue her long term coumadin therapy. For her inr of 2.8 will continue coumadin 5 mg daily and will check inr in one week.     [name] NP [facility]  Contact [phone] Monday through Friday 8am- 5pm  After hours call [phone]

## 2014-05-26 NOTE — Progress Notes (Signed)
Patient ID: Tanya Farmer, female   DOB: November 12, 1925, 80 y.o.   MRN: [id number]     [facility]  No Known Allergies   Chief Complaint  Patient presents with  . Acute Visit    coumadin  Management     HPI:  She is being seen for coumadin management. She is on long term coumadin therapy for her embolism/thrombus. She is tolerating her coumadin therapy without difficulty; there are no reports of missed doses. She has essential thrombocytosis as well. Her inr today is 1.9 and she is taking coumadin 5 mg daily    Past Medical History  Diagnosis Date  . Hypertension   . Hyperlipidemia   . Thyroid disease     Hypothyroidism  . GERD (gastroesophageal reflux disease)   . Arthritis     Degenerative  . Peripheral vascular disease     Left   Fem-Pop and  tibial Thrombectomy  . High platelet count   . Dementia   . Pain in toe of right foot   . Hypothyroidism     Past Surgical History  Procedure Laterality Date  . Femoral-popliteal bypass graft  2002    Left  Dr. Sherren Mocha Early  . Abdominal hysterectomy    . Cataract surgery      left eye    VITAL SIGNS BP 119/74  Pulse 73  Ht 5\' 4"  (1.626 m)  Wt 130 lb (58.968 kg)  BMI 22.30 kg/m2     Patient's Medications  New Prescriptions   No medications on file  Previous Medications   ATORVASTATIN (LIPITOR) 10 MG TABLET    Take 10 mg by mouth daily.   CALCIUM-VITAMIN D (OSCAL WITH D) 500-200 MG-UNIT PER TABLET    Take 1 tablet by mouth 2 (two) times daily.   CRANBERRY 125 MG TABS    Take 250 mg by mouth daily.   DONEPEZIL (ARICEPT) 10 MG TABLET    Take 10 mg by mouth daily.    HYDROXYUREA (HYDREA) 500 MG CAPSULE    Take 1,000 mg by mouth daily. May take with food to minimize GI side effects.   LEVOTHYROXINE (SYNTHROID, LEVOTHROID) 150 MCG TABLET    Take 1 tablet (150 mcg total) by mouth daily.   LOSARTAN (COZAAR) 50 MG TABLET    Take 1.5 tablets (75 mg total) by mouth daily.   MEMANTINE (NAMENDA) 5 MG TABLET    Take 1 tablet (5 mg  total) by mouth 2 (two) times daily.   MULTIPLE VITAMINS-MINERALS (MULTI COMPLETE PO)    Take 1 tablet by mouth daily.   OMEGA-3 FATTY ACIDS (FISH OIL) 1000 MG CAPS    Take 1 capsule (1,000 mg total) by mouth daily.   PANTOPRAZOLE (PROTONIX) 40 MG TABLET    Take 40 mg by mouth daily.   WARFARIN (COUMADIN) 2.5 MG TABLET    Take 2.5 mg by mouth daily. Take with 4 mg to equal 6.5 mg   WARFARIN (COUMADIN) 4 MG TABLET    Take 5 mg by mouth daily.   Modified Medications   No medications on file  Discontinued Medications   No medications on file    SIGNIFICANT DIAGNOSTIC EXAMS   08-20-13: mri of brain Moderate motion artifact.  No evidence of acute infarct.  09-16-13: chest x-ray: Stable cardiomegaly and pulmonary venous congestion. Mild infiltrates developing in the lung bases cannot be excluded. This could be secondary to mild pulmonary edema versus pneumonia.  01-22-14: ct of head: 1. No acute intracranial abnormality.2. Atrophy  and chronic microvascular white matter ischemic changes.  01-22-14: chest x-ray: No acute abnormality.  Chronic cardiomegaly.   LABS REVIEWED:  07-05-13: tsh 0.032; vit b12: 913; folate >20 09-16-13: wbc 6.1; hgb 14.1; hct 41.6; mcv 112.1; plt 731; glucose 168; bun 20; creat 0.78; k+4.0; na++143; t protein 8.6; albumin 3.7; BNP 619.3 Urine culture: p mirabilis:  09-18-13; wbc 5.6; hgb 12.8; hct 37.7; mcv 109.9; plt 576; glucose 98; bun 16; creat 0.78; k+3.7; na++139  10-28-13: wbc 4.1; hgb 10.8; hct 33.8; mcv 104; plt 162; glucose 80; bun 24; creat 1.0; k+4.1; na++142; liver normal albumin 3.5  01-01-14: glucose 83; bun 21; creat 0.8; k+3.9; na++143  01-03-14: urine culture: proteus mirabilis: augmentin 01-16-14: chol 137; ldl 71; trig 81; tsh 55.354  01-21-14: wbc 5.0; hgb 12.9; hct 40.7; mcv 112; plt 855; glucose 85; bun 16; creat 0.81; k+3.9; na++146 liver normal albumin 4.0; hgb a1c 5.7; tsh 34.630; thiamine 161.5; RPR: neg 01-22-14: wbc 4.7; hgb 12.6; hct 39.5; mcv  113.2; plt 794; glucose 106; bun 16; creat 0.78; k+3.5; na++146; tsh 43.420; free T3: 1.7; free T4: 0.86 01-26-14: urine culture: klebsiella pneumoniae treat with levaquin  02-14-14: tsh 17.397  03-31-14: tsh 35.249     Review of Systems  Unable to perform ROS    Physical Exam  Constitutional: No distress.  thin  Neck: Neck supple. No JVD present.  Cardiovascular: Normal rate and intact distal pulses.  Heart rate regular  Respiratory: Effort normal and breath sounds normal. No respiratory distress. She has no wheezes.  GI: Soft. Bowel sounds are normal. She exhibits no distension. There is no tenderness.  Musculoskeletal: She exhibits no edema  Is able to move all extremities; is mainly in wheelchair at this time.    Neurological: She is alert.  Skin: Skin is warm and dry. She is not diaphoretic.       ASSESSMENT/ PLAN:  1. Embolism/thombosis 2. Anticoagulation management She is on long term coumadin therapy. Her status is presently stable; for her inr of 1.9 will continue her coumadin 5 mg daily and wil check inr in one week. Will monitor her status.     [name] NP [facility]  Contact [phone] Monday through Friday 8am- 5pm  After hours call [phone]

## 2014-05-26 NOTE — Progress Notes (Signed)
Patient ID: Tanya Farmer, female   DOB: April 06, 1926, 80 y.o.   MRN: [id number]     [facility]  No Known Allergies   Chief Complaint  Patient presents with  . Acute Visit    coumadin management     HPI:  She is on long term coumadin therapy for embolism/thrombus with a history of essential thrombocytosis. There are no reports of missed doses present. There are no concerns being voiced by the nursing staff at this time. She is unable to fully participate in the hpi or ros. Her inr today is 1.8 and sh is taking coumadin 5 mg daily   Past Medical History  Diagnosis Date  . Hypertension   . Hyperlipidemia   . Thyroid disease     Hypothyroidism  . GERD (gastroesophageal reflux disease)   . Arthritis     Degenerative  . Peripheral vascular disease     Left   Fem-Pop and  tibial Thrombectomy  . High platelet count   . Dementia   . Pain in toe of right foot   . Hypothyroidism     Past Surgical History  Procedure Laterality Date  . Femoral-popliteal bypass graft  2002    Left  Dr. Sherren Mocha Early  . Abdominal hysterectomy    . Cataract surgery      left eye    VITAL SIGNS BP 119/87  Pulse 57  Ht 5\' 4"  (1.626 m)  Wt 130 lb (58.968 kg)  BMI 22.30 kg/m2     Patient's Medications  New Prescriptions   No medications on file  Previous Medications   ATORVASTATIN (LIPITOR) 10 MG TABLET    Take 10 mg by mouth daily.   CALCIUM-VITAMIN D (OSCAL WITH D) 500-200 MG-UNIT PER TABLET    Take 1 tablet by mouth 2 (two) times daily.   CRANBERRY 125 MG TABS    Take 250 mg by mouth daily.   DONEPEZIL (ARICEPT) 10 MG TABLET    Take 10 mg by mouth daily.    HYDROXYUREA (HYDREA) 500 MG CAPSULE    Take 1,000 mg by mouth daily. May take with food to minimize GI side effects.   LEVOTHYROXINE (SYNTHROID, LEVOTHROID) 150 MCG TABLET    Take 1 tablet (150 mcg total) by mouth daily.   LOSARTAN (COZAAR) 50 MG TABLET    Take 1.5 tablets (75 mg total) by mouth daily.   MEMANTINE (NAMENDA) 5 MG TABLET     Take 1 tablet (5 mg total) by mouth 2 (two) times daily.   MULTIPLE VITAMINS-MINERALS (MULTI COMPLETE PO)    Take 1 tablet by mouth daily.   OMEGA-3 FATTY ACIDS (FISH OIL) 1000 MG CAPS    Take 1 capsule (1,000 mg total) by mouth daily.   PANTOPRAZOLE (PROTONIX) 40 MG TABLET    Take 40 mg by mouth daily.   WARFARIN (COUMADIN) 2.5 MG TABLET    Take 2.5 mg by mouth daily. Take with 4 mg to equal 6.5 mg   WARFARIN (COUMADIN) 4 MG TABLET    Take 5 mg by mouth daily.   Modified Medications   No medications on file  Discontinued Medications   No medications on file    SIGNIFICANT DIAGNOSTIC EXAMS   08-20-13: mri of brain Moderate motion artifact.  No evidence of acute infarct.  09-16-13: chest x-ray: Stable cardiomegaly and pulmonary venous congestion. Mild infiltrates developing in the lung bases cannot be excluded. This could be secondary to mild pulmonary edema versus pneumonia.  01-22-14: ct of  head: 1. No acute intracranial abnormality.2. Atrophy and chronic microvascular white matter ischemic changes.  01-22-14: chest x-ray: No acute abnormality.  Chronic cardiomegaly.   LABS REVIEWED:  07-05-13: tsh 0.032; vit b12: 913; folate >20 09-16-13: wbc 6.1; hgb 14.1; hct 41.6; mcv 112.1; plt 731; glucose 168; bun 20; creat 0.78; k+4.0; na++143; t protein 8.6; albumin 3.7; BNP 619.3 Urine culture: p mirabilis:  09-18-13; wbc 5.6; hgb 12.8; hct 37.7; mcv 109.9; plt 576; glucose 98; bun 16; creat 0.78; k+3.7; na++139  10-28-13: wbc 4.1; hgb 10.8; hct 33.8; mcv 104; plt 162; glucose 80; bun 24; creat 1.0; k+4.1; na++142; liver normal albumin 3.5  01-01-14: glucose 83; bun 21; creat 0.8; k+3.9; na++143  01-03-14: urine culture: proteus mirabilis: augmentin 01-16-14: chol 137; ldl 71; trig 81; tsh 55.354  01-21-14: wbc 5.0; hgb 12.9; hct 40.7; mcv 112; plt 855; glucose 85; bun 16; creat 0.81; k+3.9; na++146 liver normal albumin 4.0; hgb a1c 5.7; tsh 34.630; thiamine 161.5; RPR: neg 01-22-14: wbc 4.7; hgb 12.6;  hct 39.5; mcv 113.2; plt 794; glucose 106; bun 16; creat 0.78; k+3.5; na++146; tsh 43.420; free T3: 1.7; free T4: 0.86 01-26-14: urine culture: klebsiella pneumoniae treat with levaquin  02-14-14: tsh 17.397  03-31-14: tsh 35.249     Review of Systems  Unable to perform ROS    Physical Exam  Constitutional: No distress.  thin  Neck: Neck supple. No JVD present.  Cardiovascular: Normal rate and intact distal pulses.  Heart rate regular  Respiratory: Effort normal and breath sounds normal. No respiratory distress. She has no wheezes.  GI: Soft. Bowel sounds are normal. She exhibits no distension. There is no tenderness.  Musculoskeletal: She exhibits no edema  Is able to move all extremities; is mainly in wheelchair at this time.    Neurological: She is alert.  Skin: Skin is warm and dry. She is not diaphoretic.       ASSESSMENT/ PLAN:  1. Embolism/thombosis 2. Anticoagulation management She is on long term coumadin therapy. Her status is presently stable; for her inr of 1.9 will begin coumadin 5.5 mg daily and will  check inr in one week. Will monitor her status.     [name] NP [facility]  Contact [phone] Monday through Friday 8am- 5pm  After hours call [phone]

## 2014-06-02 ENCOUNTER — Non-Acute Institutional Stay (SKILLED_NURSING_FACILITY): Payer: Medicare Other | Admitting: Adult Health

## 2014-06-02 DIAGNOSIS — I743 Embolism and thrombosis of arteries of the lower extremities: Secondary | ICD-10-CM

## 2014-06-02 DIAGNOSIS — Z7901 Long term (current) use of anticoagulants: Secondary | ICD-10-CM

## 2014-06-02 DIAGNOSIS — I482 Chronic atrial fibrillation, unspecified: Secondary | ICD-10-CM

## 2014-06-03 ENCOUNTER — Ambulatory Visit: Payer: Medicare Other | Admitting: Neurology

## 2014-06-05 ENCOUNTER — Ambulatory Visit (INDEPENDENT_AMBULATORY_CARE_PROVIDER_SITE_OTHER): Payer: Medicare Other | Admitting: Neurology

## 2014-06-05 ENCOUNTER — Encounter: Payer: Self-pay | Admitting: Neurology

## 2014-06-05 VITALS — BP 108/72 | HR 58 | Temp 97.9°F | Resp 14 | Ht 61.5 in | Wt 130.8 lb

## 2014-06-05 DIAGNOSIS — F03C0 Unspecified dementia, severe, without behavioral disturbance, psychotic disturbance, mood disturbance, and anxiety: Secondary | ICD-10-CM

## 2014-06-05 DIAGNOSIS — F0390 Unspecified dementia without behavioral disturbance: Secondary | ICD-10-CM

## 2014-06-05 DIAGNOSIS — G20 Parkinson's disease: Secondary | ICD-10-CM

## 2014-06-05 NOTE — Progress Notes (Signed)
Subjective:    Patient ID: Tanya Farmer is a 80 y.o. female.  HPI    Interim history:   Tanya Farmer is an 80 year old right-handed woman with an underlying complex medical history of peripheral vascular disease, status post left femoropopliteal bypass and tibial thrombectomy, hypertension, hyperlipidemia, hypothyroidism, reflux disease, thrombocytosis, DVTs, dementia (on generic Aricept 10 mg for about 11 years, and recently placed on Namenda), who presents for followup consultation of her parkinsonism. She is accompanied by her daughter and son in law again today. I first met her on 01/21/2014 at the request of Dr. Bubba Farmer, at which time the patient was noted to have altered mental status and mild parkinsonism. Her exam was notable for poor attention, she was essentially nonverbal, she did not participate in memory testing, she did not follow commands and I was particularly concerned about her altered personality and altered mental status. I suggested further workup in the form of blood work, head CT and EEG. Blood work showed highly abnormal TSH, high platelet count, sodium elevated at 146. Her TSH was 34.6. She recently had a head CT on 05/09/2014: 1. No acute intracranial abnormality. 2. Similar appearance of advanced chronic microvascular ischemic white matter disease. 3. Ex vacuo dilatation likely secondary to central atrophy without significant interval change. 4. Intracranial atherosclerosis. In the interim, she was diagnosed with new A. Fib. On Her thyroid dysfunction has been treated. Most recently, her TSH on 05/09/2014 was in the normal range.  Today, her daughter reports that she has had recurrent UTIs and saw urology yesterday and was diagnosed with bacteremia and she is scheduled for US Kidneys in November. [name]. She has overall had fluctuation in her mental status. She is in therapy.  [name] since January 2015 and has had ST/OT/PT. Prior to that, she  has been living with her daughter and her family for the past 4 years and prior to that she lived alone. She has developed another UTI since her stay at SNF and is currently on the second week of ABx, per daughter, but I do not see an antibiotic on her MAR. She has been on Namenda 5 mg bid. She has been on coumadin.  She has a longstanding hx of memory loss, and there is no evidence of psychosis, hallucinations or delusions. She has fallen 2 times at the SNF, no injuries reported. She has been apathetic and minimally verbal, which is not typical for her. She has not been as outgoing, and not engaged. She was with her family yesterday, and the daughter states that she was verbal and aware of her surroundings and somewhat interactive, not like she has today.  She had a brain MRI without contrast on 08/20/2013: There is moderate cerebral atrophy. Confluent periventricular T2 hyperintensity is compatible with advanced chronic small vessel ischemic disease. Small vessel changes are also noted in the pons. Small focus of susceptibility artifact in the posterior right temporal lobe may reflect a remote microhemorrhage or may be vascular. There is no other evidence for intracranial hemorrhage.  In addition, I personally reviewed the images through the PACS system.   Her Past Medical History Is Significant For: Past Medical History  Diagnosis Date  . Hypertension   . Hyperlipidemia   . Thyroid disease     Hypothyroidism  . GERD (gastroesophageal reflux disease)   . Arthritis     Degenerative  . Peripheral vascular disease     Left   Fem-Pop and  tibial Thrombectomy  . High platelet count   . Dementia   . Pain in toe of right foot   . Hypothyroidism     Her Past Surgical History Is Significant For: Past Surgical History  Procedure Laterality Date  . Femoral-popliteal bypass graft  2002    Left  Dr. Sherren Farmer Early  . Abdominal hysterectomy    . Cataract surgery      left eye    Her Family History  Is Significant For: Family History  Problem Relation Age of Onset  . Heart failure Mother   . Stroke Father     Her Social History Is Significant For: History   Social History  . Marital Status: Divorced    Spouse Name: N/A    Number of Children: N/A  . Years of Education: N/A   Social History Main Topics  . Smoking status: Former Research scientist (life sciences)  . Smokeless tobacco: Never Used  . Alcohol Use: No  . Drug Use: No  . Sexual Activity: None   Other Topics Concern  . 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    Her Allergies Are:  No Known Allergies:   Her Current Medications Are:  Outpatient Encounter Prescriptions as of 06/05/2014  Medication Sig  . aspirin 81 MG chewable tablet Chew 1 tablet (81 mg total) by mouth daily.  Marland Kitchen atorvastatin (LIPITOR) 10 MG tablet Take 10 mg by mouth daily.  . calcium-vitamin D (OSCAL WITH D) 500-200 MG-UNIT per tablet Take 1 tablet by mouth 2 (two) times daily.  . Cranberry 125 MG TABS Take 250 mg by mouth daily.  Marland Kitchen donepezil (ARICEPT) 10 MG tablet Take 10 mg by mouth daily.   . hydroxyurea (HYDREA) 500 MG capsule Take 1,000 mg by mouth daily. May take with food to minimize GI side effects.  Marland Kitchen levothyroxine (SYNTHROID, LEVOTHROID) 150 MCG tablet Take 1 tablet (150 mcg total) by mouth daily.  Marland Kitchen losartan (COZAAR) 50 MG tablet Take 1.5 tablets (75 mg total) by mouth daily.  . memantine (NAMENDA) 5 MG tablet Take 1 tablet (5 mg total) by mouth 2 (two) times daily.  . Multiple Vitamins-Minerals (MULTI COMPLETE PO) Take 1 tablet by mouth daily.  . Omega-3 Fatty Acids (FISH OIL) 1000 MG CAPS Take 1 capsule (1,000 mg total) by mouth daily.  . pantoprazole (PROTONIX) 20 MG tablet Take 20 mg by mouth daily.  . polyethylene glycol (MIRALAX / GLYCOLAX) packet Take 17 g by mouth daily as needed for mild constipation.  . saccharomyces boulardii (FLORASTOR) 250 MG capsule Take 250 mg by mouth  2 (two) times daily. For 1 week  . senna-docusate (SENOKOT-S) 8.6-50 MG per tablet Take 1 tablet by mouth daily.  Marland Kitchen warfarin (COUMADIN) 2.5 MG tablet Take 2.5 mg by mouth daily at 6 PM. TAKE 6 MG DAILY  :  Review of Systems:  Out of a complete 14 point review of systems, all are reviewed and negative with the exception of these symptoms as listed below:  Review of Systems  Constitutional: Positive for chills.  HENT: Positive for hearing loss.   Gastrointestinal: Positive for constipation.  Genitourinary:       Incontinence of bladder  Musculoskeletal: Positive for gait problem.  Allergic/Immunologic:       Frequent infections  Neurological:       Hand and chin trembling sometimes,memory loss    Objective:  Neurologic Exam  Physical Exam Physical Examination:  Filed Vitals:   06/05/14 1331  BP: 108/72  Pulse: 58  Temp: 97.9 F (36.6 C)  Resp: 14    General Examination: The patient is an elderly lady situated in her WC in no apparent distress. She is minimally verbal, which is actually improved from before. She is able to follow simple verbal commands and is able to mimic some.   HEENT: Normocephalic, atraumatic, pupils are equal, round and reactive to light and accommodation. Funduscopic exam is normal appearing. Oropharynx is clear. She has moderate mouth dryness however. She has missing teeth. Neck is mild to moderately rigid. She has no lip, neck or jaw tremor. She has no significant facial masking. She has no one-sided facial asymmetry.   Chest: is clear to auscultation without wheezing, rhonchi or crackles noted.  Heart: sounds are regular and normal without murmurs, rubs or gallops noted.   Abdomen: is soft, non-tender and non-distended with normal bowel sounds appreciated on auscultation.  Extremities: There is trace pitting edema in the distal lower extremities bilaterally, distally.   Skin: is warm and dry with no trophic changes noted.   Musculoskeletal:  exam reveals no obvious joint deformities, tenderness or joint swelling or erythema.   Neurologically:  Mental status: The patient is awake and alert, paying little attention. She is very minimally verbal. On 06/05/2014: MMSE is 6/30.  She is not as lethargic appearing his last time. She moves all 4 extremities. Tone is mildly increased throughout with mild cogwheeling noted in both upper extremities. She has no resting tremor. Briefly she keeps her hands straight and does not have any significant postural or action tremor. She does not participate in fine motor testing. I did not have her stand or walk for me. She did not participate for Romberg testing. She did not participate for cerebellar testing.  Assessment and Plan:   In summary, Tanya Farmer is a very pleasant 80 year old female with an underlying complex medical history of peripheral vascular disease, status post left femoropopliteal bypass and tibial thrombectomy, hypertension, hyperlipidemia, hypothyroidism, reflux disease, thrombocytosis, recurrent UTI and advanced dementia with an element of mild parkinsonism. Her AMS is improved. She has been on Aricept for over 11 years and I recommend continuing with Aricept and Namenda. She had recent Dx of thyroid dysfunction and new onset a fib. Sometimes patients with advanced vascular dementia can have vascular parkinsonism which affects primarily gait and balance. I could not test her posture or gait so my comment on the parkinsonism is still somewhat limited. She is participating in testing better today. She has started seeing a urologist. She is scheduled for a kidney ultrasound. I did not make any changes to her medications but asked the skilled nursing facility staff to look out for her altered mental status. She is to continue therapy. I asked her family to try to push oral fluid intake. I would like to have her reevaluated in about 3 months and suggested a followup with my mouth will see her back  after that. They were in agreement. I answered all their questions today. Most of my 45 minute visit today was spent in counseling and coordination of care, reviewing test results and reviewing medication.

## 2014-06-05 NOTE — Patient Instructions (Signed)
  Please remember, that any kind of tremor may be exacerbated by anxiety, anger, nervousness, excitement, dehydration, sleep deprivation, by caffeine, and low blood sugar values or blood sugar fluctuations. Some medications, especially some antidepressants and lithium can cause or exacerbate tremors. Tremors may temporarily calm down her subside with the use of a benzodiazepine such as Valium or related medications and with alcohol. Be aware however that drinking alcohol is not an approved treatment or appropriate treatment for tremor control and long-term use of benzodiazepines such as Valium, lorazepam, alprazolam, or clonazepam can cause habit formation, physical and psychological addiction.  I see signs of advanced dementia and mild signs of parkinsonism, parkinson's like changes, but probably not real Parkinson's disease.   I would recommend no changes in her medications, please increase water intake.   Continue ST/PT/OT.

## 2014-06-06 ENCOUNTER — Ambulatory Visit: Payer: Self-pay | Admitting: Interventional Cardiology

## 2014-06-13 ENCOUNTER — Other Ambulatory Visit: Payer: Self-pay

## 2014-06-16 ENCOUNTER — Non-Acute Institutional Stay (SKILLED_NURSING_FACILITY): Payer: Medicare Other | Admitting: Adult Health

## 2014-06-16 DIAGNOSIS — I482 Chronic atrial fibrillation, unspecified: Secondary | ICD-10-CM

## 2014-06-16 DIAGNOSIS — Z7901 Long term (current) use of anticoagulants: Secondary | ICD-10-CM

## 2014-06-16 DIAGNOSIS — I743 Embolism and thrombosis of arteries of the lower extremities: Secondary | ICD-10-CM

## 2014-06-23 ENCOUNTER — Non-Acute Institutional Stay (SKILLED_NURSING_FACILITY): Payer: Medicare Other | Admitting: Adult Health

## 2014-06-23 DIAGNOSIS — I482 Chronic atrial fibrillation, unspecified: Secondary | ICD-10-CM

## 2014-06-23 DIAGNOSIS — Z7901 Long term (current) use of anticoagulants: Secondary | ICD-10-CM

## 2014-06-23 DIAGNOSIS — I743 Embolism and thrombosis of arteries of the lower extremities: Secondary | ICD-10-CM

## 2014-06-25 ENCOUNTER — Non-Acute Institutional Stay (SKILLED_NURSING_FACILITY): Payer: Medicare Other | Admitting: Adult Health

## 2014-06-25 ENCOUNTER — Encounter: Payer: Self-pay | Admitting: Adult Health

## 2014-06-25 DIAGNOSIS — N390 Urinary tract infection, site not specified: Secondary | ICD-10-CM

## 2014-06-25 DIAGNOSIS — F0280 Dementia in other diseases classified elsewhere without behavioral disturbance: Secondary | ICD-10-CM

## 2014-06-25 DIAGNOSIS — I482 Chronic atrial fibrillation, unspecified: Secondary | ICD-10-CM

## 2014-06-25 DIAGNOSIS — K5909 Other constipation: Secondary | ICD-10-CM

## 2014-06-25 DIAGNOSIS — E038 Other specified hypothyroidism: Secondary | ICD-10-CM

## 2014-06-25 DIAGNOSIS — G3183 Dementia with Lewy bodies: Secondary | ICD-10-CM

## 2014-06-25 DIAGNOSIS — E785 Hyperlipidemia, unspecified: Secondary | ICD-10-CM

## 2014-06-25 DIAGNOSIS — D473 Essential (hemorrhagic) thrombocythemia: Secondary | ICD-10-CM

## 2014-06-25 DIAGNOSIS — I10 Essential (primary) hypertension: Secondary | ICD-10-CM

## 2014-06-25 NOTE — Progress Notes (Signed)
[facility]  Code Status: Full  No Known Allergies  Chief Complaint: Medical Management of Chronic Issues  HPI:  Tanya Farmer is an 80 yr old female being seen today for a routine visit. Overall patient is doing well. She is up in the dining area. No new complaints reported by staff. Pt reports she is feeling well with no complaints. She is unable to fully participate in ROS/HPI. She does not appear to be in any distress, discomfort, or pain.  She has been seen by neurology on 06-05-14. There were no changes made at that time to her medication regimen. Neurology feels as though there ia a parkinsonian component to her dementia; in regards to gait and balance effects. She will continue to be followed by neurology  She was seen by urology on 06-04-14 and is due to have a renal ultrasound in November of this year. She has not had any further utis this past month.    Review of Systems:  Unable to assess   Past Medical History  Diagnosis Date  . Hypertension   . Hyperlipidemia   . Thyroid disease     Hypothyroidism  . GERD (gastroesophageal reflux disease)   . Arthritis     Degenerative  . Peripheral vascular disease     Left   Fem-Pop and  tibial Thrombectomy  . High platelet count   . Dementia   . Pain in toe of right foot   . Hypothyroidism    Past Surgical History  Procedure Laterality Date  . Femoral-popliteal bypass graft  2002    Left  Dr. Sherren Mocha Early  . Abdominal hysterectomy    . Cataract surgery      left eye   Social History:   reports that she has quit smoking. She has never used smokeless tobacco. She reports that she does not drink alcohol or use illicit drugs.  Family History  Problem Relation Age of Onset  . Heart failure Mother   . Stroke Father     Medications:   Medication List       This list is accurate as of: 06/25/14  9:12 AM.  Always use your most recent med list.               aspirin 81 MG chewable tablet  Chew 1 tablet (81 mg  total) by mouth daily.     atorvastatin 10 MG tablet  Commonly known as:  LIPITOR  Take 10 mg by mouth daily.     calcium-vitamin D 500-200 MG-UNIT per tablet  Commonly known as:  OSCAL WITH D  Take 1 tablet by mouth 2 (two) times daily.     Cranberry 125 MG Tabs  Take 250 mg by mouth daily.     donepezil 10 MG tablet  Commonly known as:  ARICEPT  Take 10 mg by mouth daily.     Fish Oil 1000 MG Caps  Take 1 capsule (1,000 mg total) by mouth daily.     hydroxyurea 500 MG capsule  Commonly known as:  HYDREA  Take 1,000 mg by mouth daily. May take with food to minimize GI side effects.     levothyroxine 150 MCG tablet  Commonly known as:  SYNTHROID, LEVOTHROID  Take 1 tablet (150 mcg total) by mouth daily.     losartan 50 MG tablet  Commonly known as:  COZAAR  Take 1.5 tablets (75 mg total) by mouth daily.     memantine 5 MG tablet  Commonly  known as:  NAMENDA  Take 1 tablet (5 mg total) by mouth 2 (two) times daily.     MULTI COMPLETE PO  Take 1 tablet by mouth daily.     pantoprazole 20 MG tablet  Commonly known as:  PROTONIX  Take 20 mg by mouth daily.     polyethylene glycol packet  Commonly known as:  MIRALAX / GLYCOLAX  Take 17 g by mouth daily as needed for mild constipation.     saccharomyces boulardii 250 MG capsule  Commonly known as:  FLORASTOR  Take 250 mg by mouth 2 (two) times daily. For 1 week     senna-docusate 8.6-50 MG per tablet  Commonly known as:  Senokot-S  Take 1 tablet by mouth daily.     warfarin 2.5 MG tablet  Commonly known as:  COUMADIN  Take 5.5 mg by mouth daily at 6 PM. TAKE 6 MG DAILY          Physical Exam: Filed Vitals:   06/25/14 0903  BP: 138/73  Pulse: 62  Temp: 97.9 F (36.6 C)  Resp: 18  Weight: 129 lb 6.4 oz (58.695 kg)    General- elderly female in no acute distress; frail Neck- no lymphadenopathy, no thyromegaly, no jugular vein distension, no carotid bruit Cardiovascular- irregular heartrate; no  murmurs/ rubs/ gallops; trace ankle edema Respiratory- bilateral clear to auscultation, no wheeze, no rhonchi, no crackles, no use of accessory muscles Abdomen- bowel sounds present, soft, non tender, non-distended Musculoskeletal- able to move all 4 extremities, assist with ADLs, walks with staff; wheelchair/walker use Neurological- alert to self; decreased mm strength 3/5 Skin- warm and dry Psychiatry- pleasant; normal mood and affect   SIGNIFICANT DIAGNOSTIC EXAMS   08-20-13: mri of brain Moderate motion artifact.  No evidence of acute infarct.  09-16-13: chest x-ray: Stable cardiomegaly and pulmonary venous congestion. Mild infiltrates developing in the lung bases cannot be excluded. This could be secondary to mild pulmonary edema versus pneumonia.  01-22-14: ct of head: 1. No acute intracranial abnormality.2. Atrophy and chronic microvascular white matter ischemic changes.  01-22-14: chest x-ray: No acute abnormality.  Chronic cardiomegaly.  05-09-14; chest x-ray: 1. Low lung volumes with bibasilar (left greater than right) areas of atelectasis and/or consolidation. 2. Mild cardiomegaly.  05-09-14: ct of head: 1. No acute intracranial abnormality.2. Similar appearance of advanced chronic microvascular ischemic white matter disease. 3. Ex vacuo dilatation likely secondary to central atrophy without significant interval change. 4. Intracranial atherosclerosis.   LABS REVIEWED:  07-05-13: tsh 0.032; vit b12: 913; folate >20 09-16-13: wbc 6.1; hgb 14.1; hct 41.6; mcv 112.1; plt 731; glucose 168; bun 20; creat 0.78; k+4.0; na++143; t protein 8.6; albumin 3.7; BNP 619.3 Urine culture: p mirabilis:  09-18-13; wbc 5.6; hgb 12.8; hct 37.7; mcv 109.9; plt 576; glucose 98; bun 16; creat 0.78; k+3.7; na++139  10-28-13: wbc 4.1; hgb 10.8; hct 33.8; mcv 104; plt 162; glucose 80; bun 24; creat 1.0; k+4.1; na++142; liver normal albumin 3.5  01-01-14: glucose 83; bun 21; creat 0.8; k+3.9; na++143  01-03-14:  urine culture: proteus mirabilis: augmentin 01-16-14: chol 137; ldl 71; trig 81; tsh 55.354  01-21-14: wbc 5.0; hgb 12.9; hct 40.7; mcv 112; plt 855; glucose 85; bun 16; creat 0.81; k+3.9; na++146 liver normal albumin 4.0; hgb a1c 5.7; tsh 34.630; thiamine 161.5; RPR: neg 01-22-14: wbc 4.7; hgb 12.6; hct 39.5; mcv 113.2; plt 794; glucose 106; bun 16; creat 0.78; k+3.5; na++146; tsh 43.420; free T3: 1.7; free T4: 0.86 01-26-14: urine  culture: klebsiella pneumoniae treat with levaquin  02-14-14: tsh 17.397  03-31-14: tsh 35.249 04-29-14: tsh 1.48 05-02-14: tsh 1.38; free t3: 3.0; free t4: 2.1 05-04-14: urine culture: e-coli: macrobid.  05-09-14: wbc 5.6; hgb 12.8; hct 39.5; mcv 103.9; plt 1016; glucose 108; bun 14; creat 0.76; k+4.0; na++143; liver normal albumin 3.4; urine culture: no growth; tsh 1.260 05-11-14: wbc 4.3; hgb 12.7; hct 38.8; mcv 102.1; plt 981; glucose 92; bun 13; creat 0.65; k+4.1; na++142 05-13-14: wbc 3.6; hgb 12.2; hct 38.7; mcv 103.2; plt 1109; glucose 75; bun 14; creat 0.78; k+3.9; na++146  05-26-14:wbc 4.2; hgb 11.2; hct 36.3; mcv 106.5; plt 1193; glucose 78; bun 7; creat 0.8; k+4.5; na++ 142; liver normal albumin 3.1       Assessment/Plan  1. Afib: Rate currently controlled. On coumadin with goal inr 2-3. Check PT/INR weekly.   2.Dementia: Neurologically stable; No behavioral changes reported. Continue aricept 10mg  daily and namenda 5mg  BID; continue 81mg  ASA; will monitor; is being followed by neurology  3. Recurrent cystitis: Currently no issues since last course of treatment in September; Continue cranberry supplement. Following with urology who recommended Renal US in 4-6 weeks from 06-04-14 appt.   4. HTN: Stable, continue cozaar 75 mg daily and aspirin  5. Constipation: no issues; continue senna s twice daily and prn miralax  6. Essential thrombocytosis: no issues; continue hydroxurea 1000mg  daily and monitor clinically her platelet count is stable    7. Hypothyroidism: no  issues; TSH 1.260; Continue synthroid 150 mcg daily  8. Dyslipidemia: stable; continue lipitor 10 mg daily and fish oil 1 gm daily   9. GERD: no issues; continue protonix 20 mg daily  Westley Foots, [employer] NP [facility]  Contact [phone] Monday through Friday 8am- 5pm  After hours call [phone]

## 2014-06-26 ENCOUNTER — Encounter: Payer: Self-pay | Admitting: Adult Health

## 2014-06-26 DIAGNOSIS — G3183 Dementia with Lewy bodies: Secondary | ICD-10-CM

## 2014-06-26 DIAGNOSIS — I480 Paroxysmal atrial fibrillation: Secondary | ICD-10-CM | POA: Insufficient documentation

## 2014-06-26 DIAGNOSIS — G20 Parkinson's disease: Secondary | ICD-10-CM | POA: Insufficient documentation

## 2014-06-26 DIAGNOSIS — I743 Embolism and thrombosis of arteries of the lower extremities: Secondary | ICD-10-CM | POA: Insufficient documentation

## 2014-06-26 DIAGNOSIS — F0280 Dementia in other diseases classified elsewhere without behavioral disturbance: Secondary | ICD-10-CM | POA: Insufficient documentation

## 2014-06-26 NOTE — Progress Notes (Signed)
Patient ID: Tanya Farmer, female   DOB: 1925-12-26, 80 y.o.   MRN: [id number]     [facility]  No Known Allergies   Chief Complaint  Patient presents with  . Acute Visit    coumadin management     HPI:  She is on long term coumadin therapy for afib and thrombus/embolism for left lower extremity. Her inr is stable at 2.3 and she is taking coumadin 5.5 mg daily. Her heart rate is stable. There are no reports of any missed coumadin doses. There are no nursing concerns being voiced. She is unable to fully participate in the hpi or ros.    Past Medical History  Diagnosis Date  . Hypertension   . Hyperlipidemia   . Thyroid disease     Hypothyroidism  . GERD (gastroesophageal reflux disease)   . Arthritis     Degenerative  . Peripheral vascular disease     Left   Fem-Pop and  tibial Thrombectomy  . High platelet count   . Dementia   . Pain in toe of right foot   . Hypothyroidism   . Long term (current) use of anticoagulants 03/13/2014    Past Surgical History  Procedure Laterality Date  . Femoral-popliteal bypass graft  2002    Left  Dr. Sherren Mocha Early  . Abdominal hysterectomy    . Cataract surgery      left eye    VITAL SIGNS BP 110/57  Pulse 71  Ht 5' 1.5" (1.562 m)  Wt 129 lb (58.514 kg)  BMI 23.98 kg/m2   Patient's Medications  New Prescriptions   No medications on file  Previous Medications   ASPIRIN 81 MG CHEWABLE TABLET    Chew 1 tablet (81 mg total) by mouth daily.   ATORVASTATIN (LIPITOR) 10 MG TABLET    Take 10 mg by mouth daily.   CALCIUM-VITAMIN D (OSCAL WITH D) 500-200 MG-UNIT PER TABLET    Take 1 tablet by mouth 2 (two) times daily.   CRANBERRY 125 MG TABS    Take 250 mg by mouth daily.   DONEPEZIL (ARICEPT) 10 MG TABLET    Take 10 mg by mouth daily.    HYDROXYUREA (HYDREA) 500 MG CAPSULE    Take 1,000 mg by mouth daily. May take with food to minimize GI side effects.   LEVOTHYROXINE (SYNTHROID, LEVOTHROID) 150 MCG TABLET    Take 1 tablet (150 mcg  total) by mouth daily.   LOSARTAN (COZAAR) 50 MG TABLET    Take 1.5 tablets (75 mg total) by mouth daily.   MEMANTINE (NAMENDA) 5 MG TABLET    Take 1 tablet (5 mg total) by mouth 2 (two) times daily.   MULTIPLE VITAMINS-MINERALS (MULTI COMPLETE PO)    Take 1 tablet by mouth daily.   OMEGA-3 FATTY ACIDS (FISH OIL) 1000 MG CAPS    Take 1 capsule (1,000 mg total) by mouth daily.   PANTOPRAZOLE (PROTONIX) 20 MG TABLET    Take 20 mg by mouth daily.   POLYETHYLENE GLYCOL (MIRALAX / GLYCOLAX) PACKET    Take 17 g by mouth daily as needed for mild constipation.   SACCHAROMYCES BOULARDII (FLORASTOR) 250 MG CAPSULE    Take 250 mg by mouth 2 (two) times daily. For 1 week   SENNA-DOCUSATE (SENOKOT-S) 8.6-50 MG PER TABLET    Take 1 tablet by mouth daily.   WARFARIN (COUMADIN) 2.5 MG TABLET    Take 5.5 mg by mouth daily at 6 PM. TAKE 6 MG DAILY  Modified Medications   No medications on file  Discontinued Medications   No medications on file    SIGNIFICANT DIAGNOSTIC EXAMS  08-20-13: mri of brain Moderate motion artifact.  No evidence of acute infarct.  09-16-13: chest x-ray: Stable cardiomegaly and pulmonary venous congestion. Mild infiltrates developing in the lung bases cannot be excluded. This could be secondary to mild pulmonary edema versus pneumonia.  01-22-14: ct of head: 1. No acute intracranial abnormality.2. Atrophy and chronic microvascular white matter ischemic changes.  01-22-14: chest x-ray: No acute abnormality.  Chronic cardiomegaly.  05-09-14; chest x-ray: 1. Low lung volumes with bibasilar (left greater than right) areas of atelectasis and/or consolidation. 2. Mild cardiomegaly.  05-09-14: ct of head: 1. No acute intracranial abnormality.2. Similar appearance of advanced chronic microvascular ischemic white matter disease. 3. Ex vacuo dilatation likely secondary to central atrophy without significant interval change. 4. Intracranial atherosclerosis.   LABS REVIEWED:  07-05-13: tsh  0.032; vit b12: 913; folate >20 09-16-13: wbc 6.1; hgb 14.1; hct 41.6; mcv 112.1; plt 731; glucose 168; bun 20; creat 0.78; k+4.0; na++143; t protein 8.6; albumin 3.7; BNP 619.3 Urine culture: p mirabilis:  09-18-13; wbc 5.6; hgb 12.8; hct 37.7; mcv 109.9; plt 576; glucose 98; bun 16; creat 0.78; k+3.7; na++139  10-28-13: wbc 4.1; hgb 10.8; hct 33.8; mcv 104; plt 162; glucose 80; bun 24; creat 1.0; k+4.1; na++142; liver normal albumin 3.5  01-01-14: glucose 83; bun 21; creat 0.8; k+3.9; na++143  01-03-14: urine culture: proteus mirabilis: augmentin 01-16-14: chol 137; ldl 71; trig 81; tsh 55.354  01-21-14: wbc 5.0; hgb 12.9; hct 40.7; mcv 112; plt 855; glucose 85; bun 16; creat 0.81; k+3.9; na++146 liver normal albumin 4.0; hgb a1c 5.7; tsh 34.630; thiamine 161.5; RPR: neg 01-22-14: wbc 4.7; hgb 12.6; hct 39.5; mcv 113.2; plt 794; glucose 106; bun 16; creat 0.78; k+3.5; na++146; tsh 43.420; free T3: 1.7; free T4: 0.86 01-26-14: urine culture: klebsiella pneumoniae treat with levaquin  02-14-14: tsh 17.397  03-31-14: tsh 35.249 04-29-14: tsh 1.48 05-02-14: tsh 1.38; free t3: 3.0; free t4: 2.1 05-04-14: urine culture: e-coli: macrobid.  05-09-14: wbc 5.6; hgb 12.8; hct 39.5; mcv 103.9; plt 1016; glucose 108; bun 14; creat 0.76; k+4.0; na++143; liver normal albumin 3.4; urine culture: no growth; tsh 1.260 05-11-14: wbc 4.3; hgb 12.7; hct 38.8; mcv 102.1; plt 981; glucose 92; bun 13; creat 0.65; k+4.1; na++142 05-13-14: wbc 3.6; hgb 12.2; hct 38.7; mcv 103.2; plt 1109; glucose 75; bun 14; creat 0.78; k+3.9; na++146      Review of Systems  Unable to perform ROS    Physical Exam  Constitutional: No distress.  thin  Neck: Neck supple. No JVD present.  Cardiovascular: Normal rate and intact distal pulses.  Heart rate regular  Respiratory: Effort normal and breath sounds normal. No respiratory distress. She has no wheezes.  GI: Soft. Bowel sounds are normal. She exhibits no distension. There is no tenderness.    Musculoskeletal: She exhibits no edema  Is able to move all extremities; is mainly in wheelchair at this time. Does ambulate with staff as able.     Neurological: She is alert.  Skin: Skin is warm and dry. She is not diaphoretic.       ASSESSMENT/ PLAN:  1. afib 2. Embolism/thrombus 3. Anticoagulation management She is presently stable.  for her inr of 2.3; will continue her coumadin 5.5 mg daily and will check inr in one week and will monitor her status.     [name] NP  [place] [phone] Monday through Friday 8am- 5pm  After hours call [phone] '

## 2014-06-26 NOTE — Progress Notes (Signed)
Patient ID: Tanya Farmer, female   DOB: [birth year], 80 y.o.   MRN: [id number]     [facility]  No Known Allergies   Chief Complaint  Patient presents with  . Acute Visit    coumadin management     HPI:  She is on long term coumadin therapy for her afib and thrombus/embolism of her left lower extremity. She is tolerating the coumadin therapy without difficulty. There are no reports of missed doses present. Her inr today is 2.6 and she is taking coumadin 5.5 mg daily. Her inr goal is 2-3. She is unable to fully participate in the hpi or ros.    Past Medical History  Diagnosis Date  . Hypertension   . Hyperlipidemia   . Thyroid disease     Hypothyroidism  . GERD (gastroesophageal reflux disease)   . Arthritis     Degenerative  . Peripheral vascular disease     Left   Fem-Pop and  tibial Thrombectomy  . High platelet count   . Dementia   . Pain in toe of right foot   . Hypothyroidism   . Long term (current) use of anticoagulants 03/13/2014    Past Surgical History  Procedure Laterality Date  . Femoral-popliteal bypass graft  2002    Left  Dr. Sherren Mocha Early  . Abdominal hysterectomy    . Cataract surgery      left eye    VITAL SIGNS BP 110/67  Pulse 73  Ht 5' 1.5" (1.562 m)  Wt 129 lb (58.514 kg)  BMI 23.98 kg/m2   Patient's Medications  New Prescriptions   No medications on file  Previous Medications   ASPIRIN 81 MG CHEWABLE TABLET    Chew 1 tablet (81 mg total) by mouth daily.   ATORVASTATIN (LIPITOR) 10 MG TABLET    Take 10 mg by mouth daily.   CALCIUM-VITAMIN D (OSCAL WITH D) 500-200 MG-UNIT PER TABLET    Take 1 tablet by mouth 2 (two) times daily.   CRANBERRY 125 MG TABS    Take 250 mg by mouth daily.   DONEPEZIL (ARICEPT) 10 MG TABLET    Take 10 mg by mouth daily.    HYDROXYUREA (HYDREA) 500 MG CAPSULE    Take 1,000 mg by mouth daily. May take with food to minimize GI side effects.   LEVOTHYROXINE (SYNTHROID, LEVOTHROID) 150 MCG TABLET    Take 1 tablet (150  mcg total) by mouth daily.   LOSARTAN (COZAAR) 50 MG TABLET    Take 1.5 tablets (75 mg total) by mouth daily.   MEMANTINE (NAMENDA) 5 MG TABLET    Take 1 tablet (5 mg total) by mouth 2 (two) times daily.   MULTIPLE VITAMINS-MINERALS (MULTI COMPLETE PO)    Take 1 tablet by mouth daily.   OMEGA-3 FATTY ACIDS (FISH OIL) 1000 MG CAPS    Take 1 capsule (1,000 mg total) by mouth daily.   PANTOPRAZOLE (PROTONIX) 20 MG TABLET    Take 20 mg by mouth daily.   POLYETHYLENE GLYCOL (MIRALAX / GLYCOLAX) PACKET    Take 17 g by mouth daily as needed for mild constipation.   SACCHAROMYCES BOULARDII (FLORASTOR) 250 MG CAPSULE    Take 250 mg by mouth 2 (two) times daily. For 1 week   SENNA-DOCUSATE (SENOKOT-S) 8.6-50 MG PER TABLET    Take 1 tablet by mouth daily.   WARFARIN (COUMADIN) 2.5 MG TABLET    Take 5.5 mg daily   Modified Medications   No medications on  file  Discontinued Medications   No medications on file    SIGNIFICANT DIAGNOSTIC EXAMS  08-20-13: mri of brain Moderate motion artifact.  No evidence of acute infarct.  09-16-13: chest x-ray: Stable cardiomegaly and pulmonary venous congestion. Mild infiltrates developing in the lung bases cannot be excluded. This could be secondary to mild pulmonary edema versus pneumonia.  01-22-14: ct of head: 1. No acute intracranial abnormality.2. Atrophy and chronic microvascular white matter ischemic changes.  01-22-14: chest x-ray: No acute abnormality.  Chronic cardiomegaly.  05-09-14; chest x-ray: 1. Low lung volumes with bibasilar (left greater than right) areas of atelectasis and/or consolidation. 2. Mild cardiomegaly.  05-09-14: ct of head: 1. No acute intracranial abnormality.2. Similar appearance of advanced chronic microvascular ischemic white matter disease. 3. Ex vacuo dilatation likely secondary to central atrophy without significant interval change. 4. Intracranial atherosclerosis.   LABS REVIEWED:  07-05-13: tsh 0.032; vit b12: 913; folate  >20 09-16-13: wbc 6.1; hgb 14.1; hct 41.6; mcv 112.1; plt 731; glucose 168; bun 20; creat 0.78; k+4.0; na++143; t protein 8.6; albumin 3.7; BNP 619.3 Urine culture: p mirabilis:  09-18-13; wbc 5.6; hgb 12.8; hct 37.7; mcv 109.9; plt 576; glucose 98; bun 16; creat 0.78; k+3.7; na++139  10-28-13: wbc 4.1; hgb 10.8; hct 33.8; mcv 104; plt 162; glucose 80; bun 24; creat 1.0; k+4.1; na++142; liver normal albumin 3.5  01-01-14: glucose 83; bun 21; creat 0.8; k+3.9; na++143  01-03-14: urine culture: proteus mirabilis: augmentin 01-16-14: chol 137; ldl 71; trig 81; tsh 55.354  01-21-14: wbc 5.0; hgb 12.9; hct 40.7; mcv 112; plt 855; glucose 85; bun 16; creat 0.81; k+3.9; na++146 liver normal albumin 4.0; hgb a1c 5.7; tsh 34.630; thiamine 161.5; RPR: neg 01-22-14: wbc 4.7; hgb 12.6; hct 39.5; mcv 113.2; plt 794; glucose 106; bun 16; creat 0.78; k+3.5; na++146; tsh 43.420; free T3: 1.7; free T4: 0.86 01-26-14: urine culture: klebsiella pneumoniae treat with levaquin  02-14-14: tsh 17.397  03-31-14: tsh 35.249 04-29-14: tsh 1.48 05-02-14: tsh 1.38; free t3: 3.0; free t4: 2.1 05-04-14: urine culture: e-coli: macrobid.  05-09-14: wbc 5.6; hgb 12.8; hct 39.5; mcv 103.9; plt 1016; glucose 108; bun 14; creat 0.76; k+4.0; na++143; liver normal albumin 3.4; urine culture: no growth; tsh 1.260 05-11-14: wbc 4.3; hgb 12.7; hct 38.8; mcv 102.1; plt 981; glucose 92; bun 13; creat 0.65; k+4.1; na++142 05-13-14: wbc 3.6; hgb 12.2; hct 38.7; mcv 103.2; plt 1109; glucose 75; bun 14; creat 0.78; k+3.9; na++146      Review of Systems  Unable to perform ROS    Physical Exam  Constitutional: No distress.  thin  Neck: Neck supple. No JVD present.  Cardiovascular: Normal rate and intact distal pulses.  Heart rate regular  Respiratory: Effort normal and breath sounds normal. No respiratory distress. She has no wheezes.  GI: Soft. Bowel sounds are normal. She exhibits no distension. There is no tenderness.  Musculoskeletal: She exhibits  no edema  Is able to move all extremities; is mainly in wheelchair at this time. Does ambulate with staff as able.     Neurological: She is alert.  Skin: Skin is warm and dry. She is not diaphoretic.       ASSESSMENT/ PLAN:  1. afib 2. Embolism/thrombus 3. Anticoagulation management She is presently stable.  for her inr of 2.6; will continue her coumadin 5.5 mg daily and will check inr in one week and will monitor her status.     [name] NP [facility]  Contact [phone] Monday through  Friday 8am- 5pm  After hours call [phone] '

## 2014-06-26 NOTE — Progress Notes (Signed)
Patient ID: Tanya Farmer, female   DOB: 07/09/1926, 80 y.o.   MRN: [id number]     [facility]  No Known Allergies   Chief Complaint  Patient presents with  . Acute Visit    coumadin management     HPI:  She is on long term coumadin management for her afib with a history of thrombus in her left lower extremity. Her heart rate is stable. There are no concerns being voiced by the nursing staff at this time. She is unable to fully participate in the hpi or ros. There are no reports of missed doses present.    Past Medical History  Diagnosis Date  . Hypertension   . Hyperlipidemia   . Thyroid disease     Hypothyroidism  . GERD (gastroesophageal reflux disease)   . Arthritis     Degenerative  . Peripheral vascular disease     Left   Fem-Pop and  tibial Thrombectomy  . High platelet count   . Dementia   . Pain in toe of right foot   . Hypothyroidism     Past Surgical History  Procedure Laterality Date  . Femoral-popliteal bypass graft  2002    Left  Dr. Sherren Mocha Early  . Abdominal hysterectomy    . Cataract surgery      left eye    VITAL SIGNS BP 118/75  Pulse 64  Ht 5' 1.5" (1.562 m)  Wt 130 lb (58.968 kg)  BMI 24.17 kg/m2   Patient's Medications  New Prescriptions   No medications on file  Previous Medications   ASPIRIN 81 MG CHEWABLE TABLET    Chew 1 tablet (81 mg total) by mouth daily.   ATORVASTATIN (LIPITOR) 10 MG TABLET    Take 10 mg by mouth daily.   CALCIUM-VITAMIN D (OSCAL WITH D) 500-200 MG-UNIT PER TABLET    Take 1 tablet by mouth 2 (two) times daily.   CRANBERRY 125 MG TABS    Take 250 mg by mouth daily.   DONEPEZIL (ARICEPT) 10 MG TABLET    Take 10 mg by mouth daily.    HYDROXYUREA (HYDREA) 500 MG CAPSULE    Take 1,000 mg by mouth daily. May take with food to minimize GI side effects.   LEVOTHYROXINE (SYNTHROID, LEVOTHROID) 150 MCG TABLET    Take 1 tablet (150 mcg total) by mouth daily.   LOSARTAN (COZAAR) 50 MG TABLET    Take 1.5 tablets (75 mg total)  by mouth daily.   MEMANTINE (NAMENDA) 5 MG TABLET    Take 1 tablet (5 mg total) by mouth 2 (two) times daily.   MULTIPLE VITAMINS-MINERALS (MULTI COMPLETE PO)    Take 1 tablet by mouth daily.   OMEGA-3 FATTY ACIDS (FISH OIL) 1000 MG CAPS    Take 1 capsule (1,000 mg total) by mouth daily.   PANTOPRAZOLE (PROTONIX) 20 MG TABLET    Take 20 mg by mouth daily.   POLYETHYLENE GLYCOL (MIRALAX / GLYCOLAX) PACKET    Take 17 g by mouth daily as needed for mild constipation.   SACCHAROMYCES BOULARDII (FLORASTOR) 250 MG CAPSULE    Take 250 mg by mouth 2 (two) times daily. For 1 week   SENNA-DOCUSATE (SENOKOT-S) 8.6-50 MG PER TABLET    Take 1 tablet by mouth daily.   WARFARIN (COUMADIN) 2.5 MG TABLET    Take 2.5 mg by mouth daily at 6 PM. TAKE 6 MG DAILY  Modified Medications   No medications on file  Discontinued Medications  No medications on file    SIGNIFICANT DIAGNOSTIC EXAMS  08-20-13: mri of brain Moderate motion artifact.  No evidence of acute infarct.  09-16-13: chest x-ray: Stable cardiomegaly and pulmonary venous congestion. Mild infiltrates developing in the lung bases cannot be excluded. This could be secondary to mild pulmonary edema versus pneumonia.  01-22-14: ct of head: 1. No acute intracranial abnormality.2. Atrophy and chronic microvascular white matter ischemic changes.  01-22-14: chest x-ray: No acute abnormality.  Chronic cardiomegaly.  05-09-14; chest x-ray: 1. Low lung volumes with bibasilar (left greater than right) areas of atelectasis and/or consolidation. 2. Mild cardiomegaly.  05-09-14: ct of head: 1. No acute intracranial abnormality.2. Similar appearance of advanced chronic microvascular ischemic white matter disease. 3. Ex vacuo dilatation likely secondary to central atrophy without significant interval change. 4. Intracranial atherosclerosis.   LABS REVIEWED:  07-05-13: tsh 0.032; vit b12: 913; folate >20 09-16-13: wbc 6.1; hgb 14.1; hct 41.6; mcv 112.1; plt 731;  glucose 168; bun 20; creat 0.78; k+4.0; na++143; t protein 8.6; albumin 3.7; BNP 619.3 Urine culture: p mirabilis:  09-18-13; wbc 5.6; hgb 12.8; hct 37.7; mcv 109.9; plt 576; glucose 98; bun 16; creat 0.78; k+3.7; na++139  10-28-13: wbc 4.1; hgb 10.8; hct 33.8; mcv 104; plt 162; glucose 80; bun 24; creat 1.0; k+4.1; na++142; liver normal albumin 3.5  01-01-14: glucose 83; bun 21; creat 0.8; k+3.9; na++143  01-03-14: urine culture: proteus mirabilis: augmentin 01-16-14: chol 137; ldl 71; trig 81; tsh 55.354  01-21-14: wbc 5.0; hgb 12.9; hct 40.7; mcv 112; plt 855; glucose 85; bun 16; creat 0.81; k+3.9; na++146 liver normal albumin 4.0; hgb a1c 5.7; tsh 34.630; thiamine 161.5; RPR: neg 01-22-14: wbc 4.7; hgb 12.6; hct 39.5; mcv 113.2; plt 794; glucose 106; bun 16; creat 0.78; k+3.5; na++146; tsh 43.420; free T3: 1.7; free T4: 0.86 01-26-14: urine culture: klebsiella pneumoniae treat with levaquin  02-14-14: tsh 17.397  03-31-14: tsh 35.249 04-29-14: tsh 1.48 05-02-14: tsh 1.38; free t3: 3.0; free t4: 2.1 05-04-14: urine culture: e-coli: macrobid.  05-09-14: wbc 5.6; hgb 12.8; hct 39.5; mcv 103.9; plt 1016; glucose 108; bun 14; creat 0.76; k+4.0; na++143; liver normal albumin 3.4; urine culture: no growth; tsh 1.260 05-11-14: wbc 4.3; hgb 12.7; hct 38.8; mcv 102.1; plt 981; glucose 92; bun 13; creat 0.65; k+4.1; na++142 05-13-14: wbc 3.6; hgb 12.2; hct 38.7; mcv 103.2; plt 1109; glucose 75; bun 14; creat 0.78; k+3.9; na++146      Review of Systems  Unable to perform ROS    Physical Exam  Constitutional: No distress.  thin  Neck: Neck supple. No JVD present.  Cardiovascular: Normal rate and intact distal pulses.  Heart rate regular  Respiratory: Effort normal and breath sounds normal. No respiratory distress. She has no wheezes.  GI: Soft. Bowel sounds are normal. She exhibits no distension. There is no tenderness.  Musculoskeletal: She exhibits no edema  Is able to move all extremities; is mainly in  wheelchair at this time.    Neurological: She is alert.  Skin: Skin is warm and dry. She is not diaphoretic.       ASSESSMENT/ PLAN:  1. afib 2. Embolism/thrombus 3. Anticoagulation management She is presently stable.  for her inr of 2.4; will continue her coumadin 5.5 mg daily and will check inr in one week and will monitor her status.     [name] NP [facility]  Contact [phone] Monday through Friday 8am- 5pm  After hours call [phone] '

## 2014-06-30 ENCOUNTER — Non-Acute Institutional Stay (SKILLED_NURSING_FACILITY): Payer: Medicare Other | Admitting: Adult Health

## 2014-06-30 ENCOUNTER — Encounter: Payer: Self-pay | Admitting: Interventional Cardiology

## 2014-06-30 ENCOUNTER — Ambulatory Visit (INDEPENDENT_AMBULATORY_CARE_PROVIDER_SITE_OTHER): Payer: Medicare Other | Admitting: Interventional Cardiology

## 2014-06-30 VITALS — BP 130/82 | HR 62 | Ht 61.5 in | Wt 134.0 lb

## 2014-06-30 DIAGNOSIS — I743 Embolism and thrombosis of arteries of the lower extremities: Secondary | ICD-10-CM

## 2014-06-30 DIAGNOSIS — F0280 Dementia in other diseases classified elsewhere without behavioral disturbance: Secondary | ICD-10-CM

## 2014-06-30 DIAGNOSIS — I480 Paroxysmal atrial fibrillation: Secondary | ICD-10-CM

## 2014-06-30 DIAGNOSIS — G3183 Dementia with Lewy bodies: Secondary | ICD-10-CM

## 2014-06-30 DIAGNOSIS — I10 Essential (primary) hypertension: Secondary | ICD-10-CM

## 2014-06-30 DIAGNOSIS — Z7901 Long term (current) use of anticoagulants: Secondary | ICD-10-CM

## 2014-06-30 NOTE — Progress Notes (Signed)
Patient ID: Tanya Farmer, female   DOB: 17-Dec-1925, 80 y.o.   MRN: [id number]    [address] [place], [place]  [address] Phone: [phone] Fax:  [phone]  Date:  06/30/2014   ID:  Tanya Farmer, DOB [birth year], MRN [id number]  PCP:  Blanchie Serve, MD   ASSESSMENT:  1.Atrial fibrillation, paroxysmal 2. Chronic anticoagulation therapy 3. Hypertension 4. Dementia  PLAN:  1. No change in current therapy 2. Follow-up as needed 3. Discontinue aspirin   SUBJECTIVE: Tanya Farmer is a 80 y.o. female with no complaints. She has far advanced dementia. She had paroxysmal atrial fibrillation during hospital stay. She is on Coumadin without complications. This is being managed by her assisted living facility.   Wt Readings from Last 3 Encounters:  06/30/14 134 lb (60.782 kg)  06/25/14 129 lb 6.4 oz (58.695 kg)  06/23/14 129 lb (58.514 kg)     Past Medical History  Diagnosis Date  . Hypertension   . Hyperlipidemia   . Thyroid disease     Hypothyroidism  . GERD (gastroesophageal reflux disease)   . Arthritis     Degenerative  . Peripheral vascular disease     Left   Fem-Pop and  tibial Thrombectomy  . High platelet count   . Dementia   . Pain in toe of right foot   . Hypothyroidism   . Long term (current) use of anticoagulants 03/13/2014    Current Outpatient Prescriptions  Medication Sig Dispense Refill  . aspirin 81 MG chewable tablet Chew 1 tablet (81 mg total) by mouth daily. 30 tablet 1  . atorvastatin (LIPITOR) 10 MG tablet Take 10 mg by mouth daily.    . calcium-vitamin D (OSCAL WITH D) 500-200 MG-UNIT per tablet Take 1 tablet by mouth 2 (two) times daily.    . Cranberry 125 MG TABS Take 250 mg by mouth daily.    Marland Kitchen donepezil (ARICEPT) 10 MG tablet Take 10 mg by mouth daily.     . hydroxyurea (HYDREA) 500 MG capsule Take 500 mg by mouth.    . levothyroxine (SYNTHROID, LEVOTHROID) 150 MCG tablet Take 1 tablet (150 mcg total) by mouth daily. 90 tablet 3  .  losartan (COZAAR) 50 MG tablet Take 1.5 tablets (75 mg total) by mouth daily. 30 tablet 11  . memantine (NAMENDA) 5 MG tablet Take 1 tablet (5 mg total) by mouth 2 (two) times daily. 60 tablet 11  . Multiple Vitamins-Minerals (MULTI COMPLETE PO) Take 1 tablet by mouth daily.    . Omega-3 Fatty Acids (FISH OIL) 1000 MG CAPS Take 1 capsule (1,000 mg total) by mouth daily. 30 capsule 11  . pantoprazole (PROTONIX) 20 MG tablet Take 20 mg by mouth daily.    . polyethylene glycol (MIRALAX / GLYCOLAX) packet Take 17 g by mouth daily as needed for mild constipation. 14 each 0  . saccharomyces boulardii (FLORASTOR) 250 MG capsule Take 250 mg by mouth 2 (two) times daily. For 1 week    . senna-docusate (SENOKOT-S) 8.6-50 MG per tablet Take 1 tablet by mouth daily.    Marland Kitchen warfarin (COUMADIN) 2.5 MG tablet Take 5.5 mg by mouth daily at 6 PM. TAKE 6 MG DAILY     No current facility-administered medications for this visit.    Allergies:   No Known Allergies  Social History:  The patient  reports that she has quit smoking. She has never used smokeless tobacco. She reports that she does not drink alcohol or use  illicit drugs.   ROS:  Please see the history of present illness.   none   All other systems reviewed and negative.   OBJECTIVE: VS:  BP 130/82 mmHg  Pulse 62  Ht 5' 1.5" (1.562 m)  Wt 134 lb (60.782 kg)  BMI 24.91 kg/m2  SpO2 97% Well nourished, well developed, in no acute distress, elderly HEENT: normal Neck: JVD flat. Carotid bruit absent  Cardiac:  normal S1, S2; RRR; no murmur Lungs:  clear to auscultation bilaterally, no wheezing, rhonchi or rales Abd: soft, nontender, no hepatomegaly Ext: Edema absent. Pulses 2+ Skin: warm and dry Neuro:  CNs 2-12 intact. Noncommunicative.  EKG:  Not performed       Signed, Illene Labrador III, MD 06/30/2014 2:44 PM

## 2014-06-30 NOTE — Progress Notes (Signed)
Patient ID: Tanya Farmer, female   DOB: Jan 07, 1926, 80 y.o.   MRN: [id number]     [facility]  No Known Allergies   Chief Complaint  Patient presents with  . Acute Visit    coumadin management     HPI:  She is on long term coumadin therapy for the management of her thrombus/embolism of her left lower extremity and her afib. There are no reports of her having any missed doses of coumadin therapy and she is tolerating the coumadin without difficulty. Her inr remains stable at 2.2 and she is taking coumadin 5.5 mg daily. She is unable to fully participate in the hpi or ros.    Past Medical History  Diagnosis Date  . Hypertension   . Hyperlipidemia   . Thyroid disease     Hypothyroidism  . GERD (gastroesophageal reflux disease)   . Arthritis     Degenerative  . Peripheral vascular disease     Left   Fem-Pop and  tibial Thrombectomy  . High platelet count   . Dementia   . Pain in toe of right foot   . Hypothyroidism   . Long term (current) use of anticoagulants 03/13/2014    Past Surgical History  Procedure Laterality Date  . Femoral-popliteal bypass graft  2002    Left  Dr. Sherren Mocha Early  . Abdominal hysterectomy    . Cataract surgery      left eye    VITAL SIGNS BP 119/75 mmHg  Pulse 65  Ht 5' 1.5" (1.562 m)  Wt 134 lb (60.782 kg)  BMI 24.91 kg/m2   Patient's Medications  New Prescriptions   No medications on file  Previous Medications   ATORVASTATIN (LIPITOR) 10 MG TABLET    Take 10 mg by mouth daily.   CALCIUM-VITAMIN D (OSCAL WITH D) 500-200 MG-UNIT PER TABLET    Take 1 tablet by mouth 2 (two) times daily.   CRANBERRY 125 MG TABS    Take 250 mg by mouth daily.   DONEPEZIL (ARICEPT) 10 MG TABLET    Take 10 mg by mouth daily.    HYDROXYUREA (HYDREA) 500 MG CAPSULE    Take 1000 mg by mouth daily   LEVOTHYROXINE (SYNTHROID, LEVOTHROID) 150 MCG TABLET    Take 1 tablet (150 mcg total) by mouth daily.   LOSARTAN (COZAAR) 50 MG TABLET    Take 1.5 tablets (75 mg  total) by mouth daily.   MEMANTINE (NAMENDA) 5 MG TABLET    Take 1 tablet (5 mg total) by mouth 2 (two) times daily.   MULTIPLE VITAMINS-MINERALS (MULTI COMPLETE PO)    Take 1 tablet by mouth daily.   OMEGA-3 FATTY ACIDS (FISH OIL) 1000 MG CAPS    Take 1 capsule (1,000 mg total) by mouth daily.   PANTOPRAZOLE (PROTONIX) 20 MG TABLET    Take 20 mg by mouth daily.   POLYETHYLENE GLYCOL (MIRALAX / GLYCOLAX) PACKET    Take 17 g by mouth daily as needed for mild constipation.   SACCHAROMYCES BOULARDII (FLORASTOR) 250 MG CAPSULE    Take 250 mg by mouth 2 (two) times daily. For 1 week   SENNA-DOCUSATE (SENOKOT-S) 8.6-50 MG PER TABLET    Take 1 tablet by mouth daily.   WARFARIN (COUMADIN) 2.5 MG TABLET    Take 5.5 mg by mouth daily at 6 PM. TAKE 6 MG DAILY  Modified Medications   No medications on file  Discontinued Medications   No medications on file    SIGNIFICANT  DIAGNOSTIC EXAMS  08-20-13: mri of brain Moderate motion artifact.  No evidence of acute infarct.  09-16-13: chest x-ray: Stable cardiomegaly and pulmonary venous congestion. Mild infiltrates developing in the lung bases cannot be excluded. This could be secondary to mild pulmonary edema versus pneumonia.  01-22-14: ct of head: 1. No acute intracranial abnormality.2. Atrophy and chronic microvascular white matter ischemic changes.  01-22-14: chest x-ray: No acute abnormality.  Chronic cardiomegaly.  05-09-14; chest x-ray: 1. Low lung volumes with bibasilar (left greater than right) areas of atelectasis and/or consolidation. 2. Mild cardiomegaly.  05-09-14: ct of head: 1. No acute intracranial abnormality.2. Similar appearance of advanced chronic microvascular ischemic white matter disease. 3. Ex vacuo dilatation likely secondary to central atrophy without significant interval change. 4. Intracranial atherosclerosis.   LABS REVIEWED:   09-16-13: wbc 6.1; hgb 14.1; hct 41.6; mcv 112.1; plt 731; glucose 168; bun 20; creat 0.78; k+4.0;  na++143; t protein 8.6; albumin 3.7; BNP 619.3 Urine culture: p mirabilis:  09-18-13; wbc 5.6; hgb 12.8; hct 37.7; mcv 109.9; plt 576; glucose 98; bun 16; creat 0.78; k+3.7; na++139  10-28-13: wbc 4.1; hgb 10.8; hct 33.8; mcv 104; plt 162; glucose 80; bun 24; creat 1.0; k+4.1; na++142; liver normal albumin 3.5  01-01-14: glucose 83; bun 21; creat 0.8; k+3.9; na++143  01-03-14: urine culture: proteus mirabilis: augmentin 01-16-14: chol 137; ldl 71; trig 81; tsh 55.354  01-21-14: wbc 5.0; hgb 12.9; hct 40.7; mcv 112; plt 855; glucose 85; bun 16; creat 0.81; k+3.9; na++146 liver normal albumin 4.0; hgb a1c 5.7; tsh 34.630; thiamine 161.5; RPR: neg 01-22-14: wbc 4.7; hgb 12.6; hct 39.5; mcv 113.2; plt 794; glucose 106; bun 16; creat 0.78; k+3.5; na++146; tsh 43.420; free T3: 1.7; free T4: 0.86 01-26-14: urine culture: klebsiella pneumoniae treat with levaquin  02-14-14: tsh 17.397  03-31-14: tsh 35.249 04-29-14: tsh 1.48 05-02-14: tsh 1.38; free t3: 3.0; free t4: 2.1 05-04-14: urine culture: e-coli: macrobid.  05-09-14: wbc 5.6; hgb 12.8; hct 39.5; mcv 103.9; plt 1016; glucose 108; bun 14; creat 0.76; k+4.0; na++143; liver normal albumin 3.4; urine culture: no growth; tsh 1.260 05-11-14: wbc 4.3; hgb 12.7; hct 38.8; mcv 102.1; plt 981; glucose 92; bun 13; creat 0.65; k+4.1; na++142 05-13-14: wbc 3.6; hgb 12.2; hct 38.7; mcv 103.2; plt 1109; glucose 75; bun 14; creat 0.78; k+3.9; na++146  05-26-14:wbc 4.2; hgb 11.2; hct 36.3; mcv 106.5; plt 1193; glucose 78; bun 7; creat 0.8; k+4.5; na++ 142; liver normal albumin 3.1        Review of Systems  Unable to perform ROS    Physical Exam  Constitutional: She appears well-developed and well-nourished. No distress.  Neck: Neck supple. No JVD present. No thyromegaly present.  Cardiovascular: Normal rate, regular rhythm, normal heart sounds and intact distal pulses.   Respiratory: Breath sounds normal. No respiratory distress. She has no wheezes.  GI: Soft. Bowel sounds  are normal. She exhibits no distension. There is no tenderness.  Musculoskeletal: She exhibits no edema.  Is able to move all extremities Has some stiffness present Does ambulate with staff Does require use of wheelchair   Neurological: She is alert.  Skin: Skin is warm and dry. She is not diaphoretic.      ASSESSMENT/ PLAN:  afib Embolism and thrombus of lower extremity Anticoagulation therapy management  Her heart rate is presently stable; will remain on long term coumadin therapy. For her inr goal is 2-3. For her inr of 2.2 will continue her coumadin dose of 5.5 mg daily  and will check her inr in one week and will monitor her status.    [name] NP [facility]  Contact [phone] Monday through Friday 8am- 5pm  After hours call [phone]

## 2014-06-30 NOTE — Patient Instructions (Addendum)
Your physician has recommended you make the following change in your medication:  1) STOP Aspirin  Your physician recommends that you schedule a follow-up appointment as needed

## 2014-07-07 ENCOUNTER — Non-Acute Institutional Stay (SKILLED_NURSING_FACILITY): Payer: Medicare Other | Admitting: Adult Health

## 2014-07-07 DIAGNOSIS — Z7901 Long term (current) use of anticoagulants: Secondary | ICD-10-CM

## 2014-07-07 DIAGNOSIS — I743 Embolism and thrombosis of arteries of the lower extremities: Secondary | ICD-10-CM

## 2014-07-07 DIAGNOSIS — I480 Paroxysmal atrial fibrillation: Secondary | ICD-10-CM

## 2014-07-08 ENCOUNTER — Non-Acute Institutional Stay (SKILLED_NURSING_FACILITY): Payer: Medicare Other | Admitting: Adult Health

## 2014-07-08 ENCOUNTER — Encounter: Payer: Self-pay | Admitting: Adult Health

## 2014-07-08 DIAGNOSIS — H6123 Impacted cerumen, bilateral: Secondary | ICD-10-CM

## 2014-07-08 NOTE — Progress Notes (Signed)
[facility]  Code Status: Full  No Known Allergies  Chief Complaint  Patient presents with  . Acute Visit    vaginal itching; cerumen impaction     HPI:   Tanya Farmer is an 80 yr old female being seen today for concerns of wax impaction and vaginal irritation. It was reported by family that patient was having increased difficulty hearing and often has trouble with build up of wax. The staff also reported that the patient was scratching vaginal area frequently and appeared irritated during changing. She is unable to participate in ROS. She has been afebrile.   Review of Systems:  Unable to assess   Past Medical History  Diagnosis Date  . Hypertension   . Hyperlipidemia   . Thyroid disease     Hypothyroidism  . GERD (gastroesophageal reflux disease)   . Arthritis     Degenerative  . Peripheral vascular disease     Left   Fem-Pop and  tibial Thrombectomy  . High platelet count   . Dementia   . Pain in toe of right foot   . Hypothyroidism   . Long term (current) use of anticoagulants 03/13/2014   Past Surgical History  Procedure Laterality Date  . Femoral-popliteal bypass graft  2002    Left  Dr. Sherren Mocha Early  . Abdominal hysterectomy    . Cataract surgery      left eye   Social History:   reports that she has quit smoking. She has never used smokeless tobacco. She reports that she does not drink alcohol or use illicit drugs.  Family History  Problem Relation Age of Onset  . Heart failure Mother   . Stroke Father     Medications: Patient's Medications  New Prescriptions   No medications on file  Previous Medications   ATORVASTATIN (LIPITOR) 10 MG TABLET    Take 10 mg by mouth daily.   CALCIUM-VITAMIN D (OSCAL WITH D) 500-200 MG-UNIT PER TABLET    Take 1 tablet by mouth 2 (two) times daily.   CRANBERRY 125 MG TABS    Take 250 mg by mouth daily.   DONEPEZIL (ARICEPT) 10 MG TABLET    Take 10 mg by mouth daily.    HYDROXYUREA (HYDREA) 500 MG CAPSULE    Take  500 mg by mouth.   LEVOTHYROXINE (SYNTHROID, LEVOTHROID) 150 MCG TABLET    Take 1 tablet (150 mcg total) by mouth daily.   LOSARTAN (COZAAR) 50 MG TABLET    Take 1.5 tablets (75 mg total) by mouth daily.   MEMANTINE (NAMENDA) 5 MG TABLET    Take 1 tablet (5 mg total) by mouth 2 (two) times daily.   MULTIPLE VITAMINS-MINERALS (MULTI COMPLETE PO)    Take 1 tablet by mouth daily.   OMEGA-3 FATTY ACIDS (FISH OIL) 1000 MG CAPS    Take 1 capsule (1,000 mg total) by mouth daily.   PANTOPRAZOLE (PROTONIX) 20 MG TABLET    Take 20 mg by mouth daily.   POLYETHYLENE GLYCOL (MIRALAX / GLYCOLAX) PACKET    Take 17 g by mouth daily as needed for mild constipation.   SACCHAROMYCES BOULARDII (FLORASTOR) 250 MG CAPSULE    Take 250 mg by mouth 2 (two) times daily. For 1 week   SENNA-DOCUSATE (SENOKOT-S) 8.6-50 MG PER TABLET    Take 1 tablet by mouth daily.   WARFARIN (COUMADIN) 2.5 MG TABLET    Take 5.5 mg by mouth daily at 6 PM. TAKE 6 MG DAILY  Modified Medications   No medications on file  Discontinued Medications   No medications on file     Physical Exam:  Filed Vitals:   07/08/14 1506  BP: 136/78  Pulse: 60  Temp: 98 F (36.7 C)  Resp: 16    General- elderly female in no acute distress Ears- cerumen impaction noted to bilateral ears; unable to visualize to TM or light reflex GYN: no vaginal drainage noted during external exam; redness noted to outer labia folds and groin creases; no breakdown noted Skin- warm and dry; some redness irrigation to inner bilateral groin   SIGNIFICANT DIAGNOSTIC EXAMS  08-20-13: mri of brain Moderate motion artifact.  No evidence of acute infarct.  09-16-13: chest x-ray: Stable cardiomegaly and pulmonary venous congestion. Mild infiltrates developing in the lung bases cannot be excluded. This could be secondary to mild pulmonary edema versus pneumonia.  01-22-14: ct of head: 1. No acute intracranial abnormality.2. Atrophy and chronic microvascular white matter  ischemic changes.  01-22-14: chest x-ray: No acute abnormality.  Chronic cardiomegaly.  05-09-14; chest x-ray: 1. Low lung volumes with bibasilar (left greater than right) areas of atelectasis and/or consolidation. 2. Mild cardiomegaly.  05-09-14: ct of head: 1. No acute intracranial abnormality.2. Similar appearance of advanced chronic microvascular ischemic white matter disease. 3. Ex vacuo dilatation likely secondary to central atrophy without significant interval change. 4. Intracranial atherosclerosis.   LABS REVIEWED:   09-16-13: wbc 6.1; hgb 14.1; hct 41.6; mcv 112.1; plt 731; glucose 168; bun 20; creat 0.78; k+4.0; na++143; t protein 8.6; albumin 3.7; BNP 619.3 Urine culture: p mirabilis:  09-18-13; wbc 5.6; hgb 12.8; hct 37.7; mcv 109.9; plt 576; glucose 98; bun 16; creat 0.78; k+3.7; na++139  10-28-13: wbc 4.1; hgb 10.8; hct 33.8; mcv 104; plt 162; glucose 80; bun 24; creat 1.0; k+4.1; na++142; liver normal albumin 3.5  01-01-14: glucose 83; bun 21; creat 0.8; k+3.9; na++143  01-03-14: urine culture: proteus mirabilis: augmentin 01-16-14: chol 137; ldl 71; trig 81; tsh 55.354  01-21-14: wbc 5.0; hgb 12.9; hct 40.7; mcv 112; plt 855; glucose 85; bun 16; creat 0.81; k+3.9; na++146 liver normal albumin 4.0; hgb a1c 5.7; tsh 34.630; thiamine 161.5; RPR: neg 01-22-14: wbc 4.7; hgb 12.6; hct 39.5; mcv 113.2; plt 794; glucose 106; bun 16; creat 0.78; k+3.5; na++146; tsh 43.420; free T3: 1.7; free T4: 0.86 01-26-14: urine culture: klebsiella pneumoniae treat with levaquin  02-14-14: tsh 17.397  03-31-14: tsh 35.249 04-29-14: tsh 1.48 05-02-14: tsh 1.38; free t3: 3.0; free t4: 2.1 05-04-14: urine culture: e-coli: macrobid.  05-09-14: wbc 5.6; hgb 12.8; hct 39.5; mcv 103.9; plt 1016; glucose 108; bun 14; creat 0.76; k+4.0; na++143; liver normal albumin 3.4; urine culture: no growth; tsh 1.260 05-11-14: wbc 4.3; hgb 12.7; hct 38.8; mcv 102.1; plt 981; glucose 92; bun 13; creat 0.65; k+4.1; na++142 05-13-14: wbc  3.6; hgb 12.2; hct 38.7; mcv 103.2; plt 1109; glucose 75; bun 14; creat 0.78; k+3.9; na++146  05-26-14:wbc 4.2; hgb 11.2; hct 36.3; mcv 106.5; plt 1193; glucose 78; bun 7; creat 0.8; k+4.5; na++ 142; liver normal albumin 3.1    Assessment/Plan  1. Cerumen impaction (bilateral): Cannot remove with currette; will order debrox drops BID for 5 days and then to try to irrigate; continue to monitor  2. Vaginal irritation: does not appear to have an infection; will try nystatin powder bid to groin and outer labia for itching and redness; continue to monitor  [place], Demetrius Charity, [place] NP [facility]  Contact [phone] Monday through Friday 8am- 5pm  After hours call [phone]

## 2014-07-14 ENCOUNTER — Non-Acute Institutional Stay (SKILLED_NURSING_FACILITY): Payer: Medicare Other | Admitting: Adult Health

## 2014-07-14 DIAGNOSIS — H6120 Impacted cerumen, unspecified ear: Secondary | ICD-10-CM | POA: Insufficient documentation

## 2014-07-14 DIAGNOSIS — I743 Embolism and thrombosis of arteries of the lower extremities: Secondary | ICD-10-CM

## 2014-07-14 DIAGNOSIS — I480 Paroxysmal atrial fibrillation: Secondary | ICD-10-CM

## 2014-07-14 DIAGNOSIS — Z7901 Long term (current) use of anticoagulants: Secondary | ICD-10-CM

## 2014-07-14 LAB — POCT INR: INR: 2.4 — AB (ref <=1.1)

## 2014-07-18 ENCOUNTER — Non-Acute Institutional Stay (SKILLED_NURSING_FACILITY): Payer: Medicare Other | Admitting: Registered Nurse

## 2014-07-18 ENCOUNTER — Encounter: Payer: Self-pay | Admitting: Registered Nurse

## 2014-07-18 DIAGNOSIS — K5909 Other constipation: Secondary | ICD-10-CM

## 2014-07-18 DIAGNOSIS — F0280 Dementia in other diseases classified elsewhere without behavioral disturbance: Secondary | ICD-10-CM

## 2014-07-18 DIAGNOSIS — D473 Essential (hemorrhagic) thrombocythemia: Secondary | ICD-10-CM

## 2014-07-18 DIAGNOSIS — G3183 Dementia with Lewy bodies: Secondary | ICD-10-CM

## 2014-07-18 DIAGNOSIS — I10 Essential (primary) hypertension: Secondary | ICD-10-CM

## 2014-07-18 DIAGNOSIS — E785 Hyperlipidemia, unspecified: Secondary | ICD-10-CM

## 2014-07-18 DIAGNOSIS — E038 Other specified hypothyroidism: Secondary | ICD-10-CM

## 2014-07-18 DIAGNOSIS — K219 Gastro-esophageal reflux disease without esophagitis: Secondary | ICD-10-CM

## 2014-07-18 DIAGNOSIS — I480 Paroxysmal atrial fibrillation: Secondary | ICD-10-CM

## 2014-07-18 NOTE — Progress Notes (Signed)
Patient ID: Tanya Farmer, female   DOB: [birth year], 80 y.o.   MRN: [id number]   Place of Service: [facility]  No Known Allergies  Code Status: Full Code  Goals of Care: Longevity/Long term care  Chief Complaint  Patient presents with  . Medical Management of Chronic Issues    HTN, afib, hypothyroidism, essential thrombocytois, dementia    HPI 80 y.o. female with PMH of afib, HTN, essential thrombocytosis, dementia with Parkinsonism, HLD among others is being seen for a routine visit. Weight stable. No recent falls or skin concerns reported. No change in functional status or behaviors reported. NO concerns from staff. No concerns verbalized from patient. She was recently seen by neurology 07/16/14 and was recommended to decrease aricept from 10mg  to 5mg  daily to see if urinary control improved and a trial of sinemet 25/100mg  1/2 tab BID x 1 week then 1 tab BID to improve mobility.   Review of Systems Unable to obtain  Past Medical History  Diagnosis Date  . Hypertension   . Hyperlipidemia   . Thyroid disease     Hypothyroidism  . GERD (gastroesophageal reflux disease)   . Arthritis     Degenerative  . Peripheral vascular disease     Left   Fem-Pop and  tibial Thrombectomy  . High platelet count   . Dementia   . Pain in toe of right foot   . Hypothyroidism   . Long term (current) use of anticoagulants 03/13/2014    Past Surgical History  Procedure Laterality Date  . Femoral-popliteal bypass graft  2002    Left  Dr. Sherren Mocha Early  . Abdominal hysterectomy    . Cataract surgery      left eye    History   Social History  . Marital Status: Divorced    Spouse Name: N/A    Number of Children: N/A  . Years of Education: N/A   Occupational History  . Not on file.   Social History Main Topics  . Smoking status: Former Research scientist (life sciences)  . Smokeless tobacco: Never Used  . Alcohol Use: No  . Drug Use: No  . Sexual Activity: Not on file   Other Topics Concern  . 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      Medication List       This list is accurate as of: 07/18/14 10:54 AM.  Always use your most recent med list.               atorvastatin 10 MG tablet  Commonly known as:  LIPITOR  Take 10 mg by mouth daily.     calcium-vitamin D 500-200 MG-UNIT per tablet  Commonly known as:  OSCAL WITH D  Take 1 tablet by mouth 2 (two) times daily.     carbidopa-levodopa 25-100 MG per tablet  Commonly known as:  SINEMET IR  Take 1 tablet by mouth 2 (two) times daily.     Cranberry 125 MG Tabs  Take 250 mg by mouth daily.     donepezil 10 MG tablet  Commonly known as:  ARICEPT  Take 5 mg by mouth daily.     Fish Oil 1000 MG Caps  Take 1 capsule (1,000 mg total) by mouth daily.     hydroxyurea 500 MG capsule  Commonly known as:  HYDREA  Take 500 mg by mouth.     levothyroxine 150 MCG  tablet  Commonly known as:  SYNTHROID, LEVOTHROID  Take 1 tablet (150 mcg total) by mouth daily.     losartan 50 MG tablet  Commonly known as:  COZAAR  Take 1.5 tablets (75 mg total) by mouth daily.     memantine 5 MG tablet  Commonly known as:  NAMENDA  Take 1 tablet (5 mg total) by mouth 2 (two) times daily.     MULTI COMPLETE PO  Take 1 tablet by mouth daily.     pantoprazole 20 MG tablet  Commonly known as:  PROTONIX  Take 20 mg by mouth daily.     polyethylene glycol packet  Commonly known as:  MIRALAX / GLYCOLAX  Take 17 g by mouth daily as needed for mild constipation.     saccharomyces boulardii 250 MG capsule  Commonly known as:  FLORASTOR  Take 250 mg by mouth 2 (two) times daily. For 1 week     senna-docusate 8.6-50 MG per tablet  Commonly known as:  Senokot-S  Take 1 tablet by mouth daily.     warfarin 2.5 MG tablet  Commonly known as:  COUMADIN  Take 5 mg by mouth daily at 6 PM. TAKE 6 MG DAILY        Physical Exam Filed Vitals:   07/18/14 1049   BP: 125/68  Pulse: 68  Temp: 98.3 F (36.8 C)  Resp: 20   Constitutional: Thin elderly female in no acute distress. Sitting comfortable in chair.  HEENT: Normocephalic and atraumatic. PERRL. EOM intact. No icterus. Oral mucosa moist. Posterior pharynx clear of any exudate or lesions.  Neck: Supple and nontender. No lymphadenopathy, masses, or thyromegaly. No JVD or carotid bruits. Cardiac: Normal S1, S2. Irregularly irregular. Distal pulses intact. Trace pitting edema of BLE Lungs: No respiratory distress. Breath sounds clear bilaterally without rales, rhonchi, or wheezes. Abdomen: Audible bowel sounds in all quadrants. Soft, nontender, nondistended.  Musculoskeletal: able to move all extremities Skin: Warm and dry. No rash noted. No erythema.  Neurological: Alert  Psychiatric: Appropriate mood and affect.   Labs Reviewed CBC Latest Ref Rng 05/26/2014 05/13/2014 05/11/2014  WBC - 4.2 3.6(L) 4.3  Hemoglobin 12.0 - 16.0 g/dL 22.2(A) 12.2 12.7  Hematocrit 36 - 46 % 36 38.7 38.8  Platelets 150 - 399 K/L 1193(A) 1109(HH) 981(HH)    CMP     Component Value Date/Time   NA 142 05/26/2014   NA 146 05/13/2014 0336   K 4.5 05/26/2014   CL 106 05/13/2014 0336   CO2 28 05/13/2014 0336   GLUCOSE 75 05/13/2014 0336   GLUCOSE 85 01/21/2014 1123   BUN 7 05/26/2014   BUN 15 05/13/2014 0336   CREATININE 0.8 05/26/2014   CREATININE 0.78 05/13/2014 0336   CALCIUM 9.0 05/13/2014 0336   PROT 7.6 05/09/2014 1331   PROT 7.0 01/21/2014 1123   ALBUMIN 3.4* 05/09/2014 1331   AST 33 05/09/2014 1331   ALT 15 05/09/2014 1331   ALKPHOS 65 05/09/2014 1331   BILITOT 0.4 05/09/2014 1331   GFRNONAA 74* 05/13/2014 0336   GFRAA 85* 05/13/2014 0336    Lab Results  Component Value Date   HGBA1C 5.7* 01/21/2014   Lab Results  Component Value Date   TSH 1.260 05/09/2014   Lab Results  Component Value Date   INR 2.4* 07/14/2014   INR 1.80* 05/13/2014   INR 1.96* 05/12/2014   Lipid Panel       Component Value Date/Time   CHOL 137 01/16/2014   TRIG  81 01/16/2014   LDLCALC 71 01/16/2014    Diagnostic Studies Reviewed 08-20-13: mri of brain Moderate motion artifact. No evidence of acute infarct.  09-16-13: chest x-ray: Stable cardiomegaly and pulmonary venous congestion. Mild infiltrates developing in the lung bases cannot be excluded. This could be secondary to mild pulmonary edema versus pneumonia.  01-22-14: ct of head: 1. No acute intracranial abnormality.2. Atrophy and chronic microvascular white matter ischemic changes.  01-22-14: chest x-ray: No acute abnormality. Chronic cardiomegaly.  05-09-14; chest x-ray: 1. Low lung volumes with bibasilar (left greater than right) areas of atelectasis and/or consolidation. 2. Mild cardiomegaly.  05-09-14: ct of head: 1. No acute intracranial abnormality.2. Similar appearance of advanced chronic microvascular ischemic white matter disease. 3. Ex vacuo dilatation likely secondary to central atrophy without significant interval change. 4. Intracranial atherosclerosis.  Assessment & Plan 1. Paroxysmal atrial fibrillation Stable. Rate controlled. Continue anticoagulation with coumadin. INR goal 2-3. Last INR 2.4 on 07/14/14. Continue coumadin 5mg  daily and monitor.   2. Gastroesophageal reflux disease, esophagitis presence not specified Stable. Continue protonix 20mg  daily and monitor.   3. Other constipation Stable. Continue senna daily and miralax daily. Continue to monitor  4. Other specified hypothyroidism Stable. Recent TSH level normal. Continue synthroid 180mcg daily and monitor.   5. Dementia with Parkinsonism Continue aricept 5mg  daily. Will change namenda 5mg  BID to namenda xr 14mg  daily. Continue sinemet 25/100mg  as reccommended by neurology. Continue to monitor for change in behaviors. Continue fall risk and pressure ulcer precautions.   6. Dyslipidemia Stable. LDL at goal. Continue lipitor 10mg  daily, fish oil 1g daily  and monitor.   7. Essential thrombocytosis Persists. Continue hydroxyurea 1000mg  by mouth daily and monitor  8. HTN Stable. Continue losartan 75mg  daily and monitor.    Family/Staff Communication Plan of care discuss with nursing staff. Nursing staff verbalize understanding and agree with plan of care. No additional questions or concerns reported.    Arthur Holms, MSN, AGNP-C [facility] [address] [place], [place] [address] [phone] [8am-5pm] After hours: [phone]

## 2014-07-21 ENCOUNTER — Non-Acute Institutional Stay (SKILLED_NURSING_FACILITY): Payer: Medicare Other | Admitting: Adult Health

## 2014-07-21 DIAGNOSIS — Z7901 Long term (current) use of anticoagulants: Secondary | ICD-10-CM

## 2014-07-21 DIAGNOSIS — I480 Paroxysmal atrial fibrillation: Secondary | ICD-10-CM

## 2014-07-21 DIAGNOSIS — I743 Embolism and thrombosis of arteries of the lower extremities: Secondary | ICD-10-CM

## 2014-07-21 NOTE — Progress Notes (Signed)
Patient ID: Tanya Farmer, female   DOB: 04/21/1926, 80 y.o.   MRN: [id number]  [facility]     No Known Allergies     Chief Complaint  Patient presents with  . Acute Visit    coumadin management     HPI:  She is on long term coumadin therapy for the management of her thrombus/embolism of her left lower extremity and her afib. There are no reports of her having any missed doses of coumadin therapy and she is tolerating the coumadin without difficulty. Her inr today is 2.4 and she is presently taking coumadin 5 mg daily   Past Medical History  Diagnosis Date  . Hypertension   . Hyperlipidemia   . Thyroid disease     Hypothyroidism  . GERD (gastroesophageal reflux disease)   . Arthritis     Degenerative  . Peripheral vascular disease     Left   Fem-Pop and  tibial Thrombectomy  . High platelet count   . Dementia   . Pain in toe of right foot   . Hypothyroidism   . Long term (current) use of anticoagulants 03/13/2014    Past Surgical History  Procedure Laterality Date  . Femoral-popliteal bypass graft  2002    Left  Dr. Sherren Mocha Early  . Abdominal hysterectomy    . Cataract surgery      left eye    VITAL SIGNS BP 129/68 mmHg  Pulse 67  Ht 5' (1.524 m)  Wt 132 lb 12.8 oz (60.238 kg)  BMI 25.94 kg/m2   Outpatient Encounter Prescriptions as of 07/14/2014  Medication Sig  . atorvastatin (LIPITOR) 10 MG tablet Take 10 mg by mouth daily.  . calcium-vitamin D (OSCAL WITH D) 500-200 MG-UNIT per tablet Take 1 tablet by mouth 2 (two) times daily.  . Cranberry 125 MG TABS Take 250 mg by mouth daily.  Marland Kitchen donepezil (ARICEPT) 10 MG tablet Take 5 mg by mouth daily.   . hydroxyurea (HYDREA) 500 MG capsule Take 500 mg by mouth.  . levothyroxine (SYNTHROID, LEVOTHROID) 150 MCG tablet Take 1 tablet (150 mcg total) by mouth daily.  Marland Kitchen losartan (COZAAR) 50 MG tablet Take 1.5 tablets (75 mg total) by mouth daily.  . memantine (NAMENDA) 5 MG tablet Take 1 tablet (5 mg total) by mouth 2  (two) times daily.  . Multiple Vitamins-Minerals (MULTI COMPLETE PO) Take 1 tablet by mouth daily.  . Omega-3 Fatty Acids (FISH OIL) 1000 MG CAPS Take 1 capsule (1,000 mg total) by mouth daily.  . pantoprazole (PROTONIX) 20 MG tablet Take 20 mg by mouth daily.  . polyethylene glycol (MIRALAX / GLYCOLAX) packet Take 17 g by mouth daily as needed for mild constipation.  . saccharomyces boulardii (FLORASTOR) 250 MG capsule Take 250 mg by mouth 2 (two) times daily. For 1 week  . senna-docusate (SENOKOT-S) 8.6-50 MG per tablet Take 1 tablet by mouth daily.  Marland Kitchen warfarin (COUMADIN) 2.5 MG tablet Take 5 mg by mouth daily at 6 PM. TAKE 6 MG DAILY     SIGNIFICANT DIAGNOSTIC EXAMS  08-20-13: mri of brain Moderate motion artifact.  No evidence of acute infarct.  09-16-13: chest x-ray: Stable cardiomegaly and pulmonary venous congestion. Mild infiltrates developing in the lung bases cannot be excluded. This could be secondary to mild pulmonary edema versus pneumonia.  01-22-14: ct of head: 1. No acute intracranial abnormality.2. Atrophy and chronic microvascular white matter ischemic changes.  01-22-14: chest x-ray: No acute abnormality.  Chronic cardiomegaly.  05-09-14; chest  x-ray: 1. Low lung volumes with bibasilar (left greater than right) areas of atelectasis and/or consolidation. 2. Mild cardiomegaly.  05-09-14: ct of head: 1. No acute intracranial abnormality.2. Similar appearance of advanced chronic microvascular ischemic white matter disease. 3. Ex vacuo dilatation likely secondary to central atrophy without significant interval change. 4. Intracranial atherosclerosis.   LABS REVIEWED:   09-16-13: wbc 6.1; hgb 14.1; hct 41.6; mcv 112.1; plt 731; glucose 168; bun 20; creat 0.78; k+4.0; na++143; t protein 8.6; albumin 3.7; BNP 619.3 Urine culture: p mirabilis:  09-18-13; wbc 5.6; hgb 12.8; hct 37.7; mcv 109.9; plt 576; glucose 98; bun 16; creat 0.78; k+3.7; na++139  10-28-13: wbc 4.1; hgb 10.8; hct  33.8; mcv 104; plt 162; glucose 80; bun 24; creat 1.0; k+4.1; na++142; liver normal albumin 3.5  01-01-14: glucose 83; bun 21; creat 0.8; k+3.9; na++143  01-03-14: urine culture: proteus mirabilis: augmentin 01-16-14: chol 137; ldl 71; trig 81; tsh 55.354  01-21-14: wbc 5.0; hgb 12.9; hct 40.7; mcv 112; plt 855; glucose 85; bun 16; creat 0.81; k+3.9; na++146 liver normal albumin 4.0; hgb a1c 5.7; tsh 34.630; thiamine 161.5; RPR: neg 01-22-14: wbc 4.7; hgb 12.6; hct 39.5; mcv 113.2; plt 794; glucose 106; bun 16; creat 0.78; k+3.5; na++146; tsh 43.420; free T3: 1.7; free T4: 0.86 01-26-14: urine culture: klebsiella pneumoniae treat with levaquin  02-14-14: tsh 17.397  03-31-14: tsh 35.249 04-29-14: tsh 1.48 05-02-14: tsh 1.38; free t3: 3.0; free t4: 2.1 05-04-14: urine culture: e-coli: macrobid.  05-09-14: wbc 5.6; hgb 12.8; hct 39.5; mcv 103.9; plt 1016; glucose 108; bun 14; creat 0.76; k+4.0; na++143; liver normal albumin 3.4; urine culture: no growth; tsh 1.260 05-11-14: wbc 4.3; hgb 12.7; hct 38.8; mcv 102.1; plt 981; glucose 92; bun 13; creat 0.65; k+4.1; na++142 05-13-14: wbc 3.6; hgb 12.2; hct 38.7; mcv 103.2; plt 1109; glucose 75; bun 14; creat 0.78; k+3.9; na++146  05-26-14:wbc 4.2; hgb 11.2; hct 36.3; mcv 106.5; plt 1193; glucose 78; bun 7; creat 0.8; k+4.5; na++ 142; liver normal albumin 3.1         Review of Systems  Unable to perform ROS    Physical Exam  Constitutional: She appears well-developed and well-nourished. No distress.  Neck: Neck supple. No JVD present. No thyromegaly present.  Cardiovascular: Normal rate, regular rhythm, normal heart sounds and intact distal pulses.   Respiratory: Breath sounds normal. No respiratory distress. She has no wheezes.  GI: Soft. Bowel sounds are normal. She exhibits no distension. There is no tenderness.  Musculoskeletal: She exhibits no edema.  Is able to move all extremities Has some stiffness present Does ambulate with staff Does require use of  wheelchair   Neurological: She is alert.  Skin: Skin is warm and dry. She is not diaphoretic.      ASSESSMENT/ PLAN:  afib Embolism and thrombus of lower extremity Anticoagulation therapy management  Her heart rate is presently stable; will remain on long term coumadin therapy. For her inr goal is 2-3. For her inr of 2.4 will continue her coumadin 5 mg daily and will check inr in one week and will monitor her status.     [name] NP [facility]  Contact [phone] Monday through Friday 8am- 5pm  After hours call [phone]

## 2014-07-21 NOTE — Progress Notes (Signed)
Patient ID: Tanya Farmer, female   DOB: 10-21-1925, 80 y.o.   MRN: [id number]  [facility]     No Known Allergies     Chief Complaint  Patient presents with  . Acute Visit    coumadin management     HPI:  She is on long term coumadin therapy for the management of her thrombus/embolism of her left lower extremity and her afib. There are no reports of her having any missed doses of coumadin therapy and she is tolerating the coumadin without difficulty. Her inr today is 3.1 and she is taking coumadin 5.5.    Past Medical History  Diagnosis Date  . Hypertension   . Hyperlipidemia   . Thyroid disease     Hypothyroidism  . GERD (gastroesophageal reflux disease)   . Arthritis     Degenerative  . Peripheral vascular disease     Left   Fem-Pop and  tibial Thrombectomy  . High platelet count   . Dementia   . Pain in toe of right foot   . Hypothyroidism   . Long term (current) use of anticoagulants 03/13/2014    Past Surgical History  Procedure Laterality Date  . Femoral-popliteal bypass graft  2002    Left  Dr. Sherren Mocha Early  . Abdominal hysterectomy    . Cataract surgery      left eye    VITAL SIGNS BP 136/78 mmHg  Pulse 60  Ht 5' (1.524 m)  Wt 132 lb 12.8 oz (60.238 kg)  BMI 25.94 kg/m2   Outpatient Encounter Prescriptions as of 07/07/2014  Medication Sig  . atorvastatin (LIPITOR) 10 MG tablet Take 10 mg by mouth daily.  . calcium-vitamin D (OSCAL WITH D) 500-200 MG-UNIT per tablet Take 1 tablet by mouth 2 (two) times daily.  . Cranberry 125 MG TABS Take 250 mg by mouth daily.  Marland Kitchen donepezil (ARICEPT) 10 MG tablet Take 5 mg by mouth daily.   . hydroxyurea (HYDREA) 500 MG capsule Take 500 mg by mouth.  . levothyroxine (SYNTHROID, LEVOTHROID) 150 MCG tablet Take 1 tablet (150 mcg total) by mouth daily.  Marland Kitchen losartan (COZAAR) 50 MG tablet Take 1.5 tablets (75 mg total) by mouth daily.  . memantine (NAMENDA) 5 MG tablet Take 1 tablet (5 mg total) by mouth 2 (two) times  daily.  . Multiple Vitamins-Minerals (MULTI COMPLETE PO) Take 1 tablet by mouth daily.  . Omega-3 Fatty Acids (FISH OIL) 1000 MG CAPS Take 1 capsule (1,000 mg total) by mouth daily.  . pantoprazole (PROTONIX) 20 MG tablet Take 20 mg by mouth daily.  . polyethylene glycol (MIRALAX / GLYCOLAX) packet Take 17 g by mouth daily as needed for mild constipation.  . saccharomyces boulardii (FLORASTOR) 250 MG capsule Take 250 mg by mouth 2 (two) times daily. For 1 week  . senna-docusate (SENOKOT-S) 8.6-50 MG per tablet Take 1 tablet by mouth daily.  Marland Kitchen warfarin (COUMADIN) 2.5 MG tablet Take 5 mg by mouth daily at 6 PM. TAKE 6 MG DAILY     SIGNIFICANT DIAGNOSTIC EXAMS  08-20-13: mri of brain Moderate motion artifact.  No evidence of acute infarct.  09-16-13: chest x-ray: Stable cardiomegaly and pulmonary venous congestion. Mild infiltrates developing in the lung bases cannot be excluded. This could be secondary to mild pulmonary edema versus pneumonia.  01-22-14: ct of head: 1. No acute intracranial abnormality.2. Atrophy and chronic microvascular white matter ischemic changes.  01-22-14: chest x-ray: No acute abnormality.  Chronic cardiomegaly.  05-09-14; chest x-ray: 1.  Low lung volumes with bibasilar (left greater than right) areas of atelectasis and/or consolidation. 2. Mild cardiomegaly.  05-09-14: ct of head: 1. No acute intracranial abnormality.2. Similar appearance of advanced chronic microvascular ischemic white matter disease. 3. Ex vacuo dilatation likely secondary to central atrophy without significant interval change. 4. Intracranial atherosclerosis.   LABS REVIEWED:   09-16-13: wbc 6.1; hgb 14.1; hct 41.6; mcv 112.1; plt 731; glucose 168; bun 20; creat 0.78; k+4.0; na++143; t protein 8.6; albumin 3.7; BNP 619.3 Urine culture: p mirabilis:  09-18-13; wbc 5.6; hgb 12.8; hct 37.7; mcv 109.9; plt 576; glucose 98; bun 16; creat 0.78; k+3.7; na++139  10-28-13: wbc 4.1; hgb 10.8; hct 33.8; mcv  104; plt 162; glucose 80; bun 24; creat 1.0; k+4.1; na++142; liver normal albumin 3.5  01-01-14: glucose 83; bun 21; creat 0.8; k+3.9; na++143  01-03-14: urine culture: proteus mirabilis: augmentin 01-16-14: chol 137; ldl 71; trig 81; tsh 55.354  01-21-14: wbc 5.0; hgb 12.9; hct 40.7; mcv 112; plt 855; glucose 85; bun 16; creat 0.81; k+3.9; na++146 liver normal albumin 4.0; hgb a1c 5.7; tsh 34.630; thiamine 161.5; RPR: neg 01-22-14: wbc 4.7; hgb 12.6; hct 39.5; mcv 113.2; plt 794; glucose 106; bun 16; creat 0.78; k+3.5; na++146; tsh 43.420; free T3: 1.7; free T4: 0.86 01-26-14: urine culture: klebsiella pneumoniae treat with levaquin  02-14-14: tsh 17.397  03-31-14: tsh 35.249 04-29-14: tsh 1.48 05-02-14: tsh 1.38; free t3: 3.0; free t4: 2.1 05-04-14: urine culture: e-coli: macrobid.  05-09-14: wbc 5.6; hgb 12.8; hct 39.5; mcv 103.9; plt 1016; glucose 108; bun 14; creat 0.76; k+4.0; na++143; liver normal albumin 3.4; urine culture: no growth; tsh 1.260 05-11-14: wbc 4.3; hgb 12.7; hct 38.8; mcv 102.1; plt 981; glucose 92; bun 13; creat 0.65; k+4.1; na++142 05-13-14: wbc 3.6; hgb 12.2; hct 38.7; mcv 103.2; plt 1109; glucose 75; bun 14; creat 0.78; k+3.9; na++146  05-26-14:wbc 4.2; hgb 11.2; hct 36.3; mcv 106.5; plt 1193; glucose 78; bun 7; creat 0.8; k+4.5; na++ 142; liver normal albumin 3.1        Review of Systems  Unable to perform ROS    Physical Exam Constitutional: She appears well-developed and well-nourished. No distress.  Neck: Neck supple. No JVD present. No thyromegaly present.  Cardiovascular: Normal rate, regular rhythm, normal heart sounds and intact distal pulses.   Respiratory: Breath sounds normal. No respiratory distress. She has no wheezes.  GI: Soft. Bowel sounds are normal. She exhibits no distension. There is no tenderness.  Musculoskeletal: She exhibits no edema.  Is able to move all extremities Has some stiffness present Does ambulate with staff Does require use of wheelchair     Neurological: She is alert.  Skin: Skin is warm and dry. She is not diaphoretic.      ASSESSMENT/ PLAN:  afib Embolism and thrombus of lower extremity Anticoagulation therapy management  Her heart rate is presently stable; will remain on long term coumadin therapy. For her inr goal is 2-3. For her inr of 3.1 will hold her coumadin for one dose then begin coumadin 5 mg daily and will check inr in one week and will monitor her status.        [name] NP [facility]  Contact [phone] Monday through Friday 8am- 5pm  After hours call [phone]

## 2014-07-23 NOTE — Progress Notes (Signed)
Patient ID: Tanya Farmer, female   DOB: 10/17/1925, 80 y.o.   MRN: [id number]  [facility]     No Known Allergies     Chief Complaint  Patient presents with  . Acute Visit    coumadin management     HPI:  She is on long term coumadin therapy for the management of her afib and embolism of her lower extremity. She is presently stable; her heart rate is stable. There are no reports of missed doses present. Her inr today is 2.7 and she is taking 5 mg coumadin daily.   Past Medical History  Diagnosis Date  . Hypertension   . Hyperlipidemia   . Thyroid disease     Hypothyroidism  . GERD (gastroesophageal reflux disease)   . Arthritis     Degenerative  . Peripheral vascular disease     Left   Fem-Pop and  tibial Thrombectomy  . High platelet count   . Dementia   . Pain in toe of right foot   . Hypothyroidism   . Long term (current) use of anticoagulants 03/13/2014    Past Surgical History  Procedure Laterality Date  . Femoral-popliteal bypass graft  2002    Left  Dr. Sherren Mocha Early  . Abdominal hysterectomy    . Cataract surgery      left eye    VITAL SIGNS BP 137/68 mmHg  Pulse 72  Ht 5' (1.524 m)  Wt 129 lb (58.514 kg)  BMI 25.19 kg/m2   Outpatient Encounter Prescriptions as of 07/21/2014  Medication Sig  . atorvastatin (LIPITOR) 10 MG tablet Take 10 mg by mouth daily.  . calcium-vitamin D (OSCAL WITH D) 500-200 MG-UNIT per tablet Take 1 tablet by mouth 2 (two) times daily.  . carbidopa-levodopa (SINEMET IR) 25-100 MG per tablet Take 1 tablet by mouth 2 (two) times daily.  . Cranberry 125 MG TABS Take 250 mg by mouth daily.  Marland Kitchen donepezil (ARICEPT) 10 MG tablet Take 5 mg by mouth daily.   . hydroxyurea (HYDREA) 500 MG capsule Take 500 mg by mouth.  . levothyroxine (SYNTHROID, LEVOTHROID) 150 MCG tablet Take 1 tablet (150 mcg total) by mouth daily.  Marland Kitchen losartan (COZAAR) 50 MG tablet Take 1.5 tablets (75 mg total) by mouth daily.  . memantine (NAMENDA) 5 MG tablet Take  1 tablet (5 mg total) by mouth 2 (two) times daily.  . Multiple Vitamins-Minerals (MULTI COMPLETE PO) Take 1 tablet by mouth daily.  . Omega-3 Fatty Acids (FISH OIL) 1000 MG CAPS Take 1 capsule (1,000 mg total) by mouth daily.  . pantoprazole (PROTONIX) 20 MG tablet Take 20 mg by mouth daily.  . polyethylene glycol (MIRALAX / GLYCOLAX) packet Take 17 g by mouth daily as needed for mild constipation.  . saccharomyces boulardii (FLORASTOR) 250 MG capsule Take 250 mg by mouth 2 (two) times daily. For 1 week  . senna-docusate (SENOKOT-S) 8.6-50 MG per tablet Take 1 tablet by mouth daily.  Marland Kitchen warfarin (COUMADIN) 2.5 MG tablet Take 5 mg by mouth daily at 6 PM. TAKE 6 MG DAILY     SIGNIFICANT DIAGNOSTIC EXAMS  08-20-13: mri of brain Moderate motion artifact.  No evidence of acute infarct.  09-16-13: chest x-ray: Stable cardiomegaly and pulmonary venous congestion. Mild infiltrates developing in the lung bases cannot be excluded. This could be secondary to mild pulmonary edema versus pneumonia.  01-22-14: ct of head: 1. No acute intracranial abnormality.2. Atrophy and chronic microvascular white matter ischemic changes.  01-22-14: chest x-ray:  No acute abnormality.  Chronic cardiomegaly.  05-09-14; chest x-ray: 1. Low lung volumes with bibasilar (left greater than right) areas of atelectasis and/or consolidation. 2. Mild cardiomegaly.  05-09-14: ct of head: 1. No acute intracranial abnormality.2. Similar appearance of advanced chronic microvascular ischemic white matter disease. 3. Ex vacuo dilatation likely secondary to central atrophy without significant interval change. 4. Intracranial atherosclerosis.   LABS REVIEWED:   09-16-13: wbc 6.1; hgb 14.1; hct 41.6; mcv 112.1; plt 731; glucose 168; bun 20; creat 0.78; k+4.0; na++143; t protein 8.6; albumin 3.7; BNP 619.3 Urine culture: p mirabilis:  09-18-13; wbc 5.6; hgb 12.8; hct 37.7; mcv 109.9; plt 576; glucose 98; bun 16; creat 0.78; k+3.7; na++139    10-28-13: wbc 4.1; hgb 10.8; hct 33.8; mcv 104; plt 162; glucose 80; bun 24; creat 1.0; k+4.1; na++142; liver normal albumin 3.5  01-01-14: glucose 83; bun 21; creat 0.8; k+3.9; na++143  01-03-14: urine culture: proteus mirabilis: augmentin 01-16-14: chol 137; ldl 71; trig 81; tsh 55.354  01-21-14: wbc 5.0; hgb 12.9; hct 40.7; mcv 112; plt 855; glucose 85; bun 16; creat 0.81; k+3.9; na++146 liver normal albumin 4.0; hgb a1c 5.7; tsh 34.630; thiamine 161.5; RPR: neg 01-22-14: wbc 4.7; hgb 12.6; hct 39.5; mcv 113.2; plt 794; glucose 106; bun 16; creat 0.78; k+3.5; na++146; tsh 43.420; free T3: 1.7; free T4: 0.86 01-26-14: urine culture: klebsiella pneumoniae treat with levaquin  02-14-14: tsh 17.397  03-31-14: tsh 35.249 04-29-14: tsh 1.48 05-02-14: tsh 1.38; free t3: 3.0; free t4: 2.1 05-04-14: urine culture: e-coli: macrobid.  05-09-14: wbc 5.6; hgb 12.8; hct 39.5; mcv 103.9; plt 1016; glucose 108; bun 14; creat 0.76; k+4.0; na++143; liver normal albumin 3.4; urine culture: no growth; tsh 1.260 05-11-14: wbc 4.3; hgb 12.7; hct 38.8; mcv 102.1; plt 981; glucose 92; bun 13; creat 0.65; k+4.1; na++142 05-13-14: wbc 3.6; hgb 12.2; hct 38.7; mcv 103.2; plt 1109; glucose 75; bun 14; creat 0.78; k+3.9; na++146  05-26-14:wbc 4.2; hgb 11.2; hct 36.3; mcv 106.5; plt 1193; glucose 78; bun 7; creat 0.8; k+4.5; na++ 142; liver normal albumin 3.1        Review of Systems  Unable to perform ROS    Physical Exam  Constitutional: She appears well-developed and well-nourished. No distress.  Neck: Neck supple. No JVD present. No thyromegaly present.  Cardiovascular: Normal rate, regular rhythm, normal heart sounds and intact distal pulses.   Respiratory: Breath sounds normal. No respiratory distress. She has no wheezes.  GI: Soft. Bowel sounds are normal. She exhibits no distension. There is no tenderness.  Musculoskeletal: She exhibits no edema.  Is able to move all extremities Has some stiffness present Does ambulate  with staff Does require use of wheelchair   Neurological: She is alert.  Skin: Skin is warm and dry. She is not diaphoretic.      ASSESSMENT/ PLAN:  1. afib 2, embolism of lower extremity 3. Anticoagulation management  She remains stable her inr remains stable at 2.7 will continue coumadin 5 mg daily and will check inr in one week and will monitor status.    [name] NP [facility]  Contact [phone] Monday through Friday 8am- 5pm  After hours call [phone]

## 2014-07-28 ENCOUNTER — Non-Acute Institutional Stay (SKILLED_NURSING_FACILITY): Payer: Medicare Other | Admitting: Registered Nurse

## 2014-07-28 ENCOUNTER — Encounter: Payer: Self-pay | Admitting: Registered Nurse

## 2014-07-28 DIAGNOSIS — Z7901 Long term (current) use of anticoagulants: Secondary | ICD-10-CM

## 2014-07-28 LAB — POCT INR: INR: 2.1 — AB (ref <=1.1)

## 2014-07-28 NOTE — Progress Notes (Signed)
Patient ID: Tanya Farmer, female   DOB: 01-Jan-1926, 80 y.o.   MRN: [id number]   Place of Service: [facility]  No Known Allergies  Code Status: Full Code  Goals of Care: Longevity/Long term care  Chief Complaint  Patient presents with  . Acute Visit    coumadin management.     HPI 80 y.o. female with PMH of afib, HTN, essential thrombocytosis, dementia with Parkinsonism, HLD among others is being seen for an acute visit for coumadin management. She is on long term anticoagulation with coumadin for afib and embolism of her lower extremities. Her INR today is 2.1 and she is taking coumadin 5mg  daily.   Review of Systems Unable to obtain  Past Medical History  Diagnosis Date  . Hypertension   . Hyperlipidemia   . Thyroid disease     Hypothyroidism  . GERD (gastroesophageal reflux disease)   . Arthritis     Degenerative  . Peripheral vascular disease     Left   Fem-Pop and  tibial Thrombectomy  . High platelet count   . Dementia   . Pain in toe of right foot   . Hypothyroidism   . Long term (current) use of anticoagulants 03/13/2014    Past Surgical History  Procedure Laterality Date  . Femoral-popliteal bypass graft  2002    Left  Dr. Sherren Mocha Early  . Abdominal hysterectomy    . Cataract surgery      left eye    History   Social History  . Marital Status: Divorced    Spouse Name: N/A    Number of Children: N/A  . Years of Education: N/A   Occupational History  . Not on file.   Social History Main Topics  . Smoking status: Former Research scientist (life sciences)  . Smokeless tobacco: Never Used  . Alcohol Use: No  . Drug Use: No  . Sexual Activity: Not on file   Other Topics Concern  . 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      Medication List       This list is accurate as of: 07/28/14  4:58 PM.  Always use your most recent med list.               atorvastatin 10 MG tablet    Commonly known as:  LIPITOR  Take 10 mg by mouth daily.     calcium-vitamin D 500-200 MG-UNIT per tablet  Commonly known as:  OSCAL WITH D  Take 1 tablet by mouth 2 (two) times daily.     carbidopa-levodopa 25-100 MG per tablet  Commonly known as:  SINEMET IR  Take 1 tablet by mouth 2 (two) times daily.     Cranberry 125 MG Tabs  Take 250 mg by mouth daily.     donepezil 10 MG tablet  Commonly known as:  ARICEPT  Take 5 mg by mouth daily.     Fish Oil 1000 MG Caps  Take 1 capsule (1,000 mg total) by mouth daily.     hydroxyurea 500 MG capsule  Commonly known as:  HYDREA  Take 500 mg by mouth.     levothyroxine 150 MCG tablet  Commonly known as:  SYNTHROID, LEVOTHROID  Take 1 tablet (150 mcg total) by mouth daily.     losartan 50 MG tablet  Commonly known as:  COZAAR  Take 1.5 tablets (75 mg total) by mouth daily.  memantine 5 MG tablet  Commonly known as:  NAMENDA  Take 1 tablet (5 mg total) by mouth 2 (two) times daily.     MULTI COMPLETE PO  Take 1 tablet by mouth daily.     pantoprazole 20 MG tablet  Commonly known as:  PROTONIX  Take 20 mg by mouth daily.     polyethylene glycol packet  Commonly known as:  MIRALAX / GLYCOLAX  Take 17 g by mouth daily as needed for mild constipation.     saccharomyces boulardii 250 MG capsule  Commonly known as:  FLORASTOR  Take 250 mg by mouth 2 (two) times daily. For 1 week     senna-docusate 8.6-50 MG per tablet  Commonly known as:  Senokot-S  Take 1 tablet by mouth daily.     warfarin 2.5 MG tablet  Commonly known as:  COUMADIN  Take 5 mg by mouth daily at 6 PM. TAKE 6 MG DAILY        Physical Exam Filed Vitals:   07/28/14 1645  BP: 146/84  Pulse: 58  Temp: 98.7 F (37.1 C)  Resp: 18   Constitutional: Thin elderly female in no acute distress. Sitting comfortable in chair.  HEENT: Normocephalic and atraumatic. PERRL. EOM intact. No icterus. Oral mucosa moist. Posterior pharynx clear of any exudate or  lesions.  Neck: Supple and nontender. No lymphadenopathy, masses, or thyromegaly. No JVD or carotid bruits. Cardiac: Normal S1, S2. RRR. Distal pulses intact. Trace pitting edema of BLE Lungs: No respiratory distress. Breath sounds clear bilaterally without rales, rhonchi, or wheezes. Abdomen: Audible bowel sounds in all quadrants. Soft, nontender, nondistended.  Musculoskeletal: able to move all extremities Skin: Warm and dry. No rash noted. No erythema.  Neurological: Alert  Psychiatric: Appropriate mood and affect.   Labs Reviewed CBC Latest Ref Rng 05/26/2014 05/13/2014 05/11/2014  WBC - 4.2 3.6(L) 4.3  Hemoglobin 12.0 - 16.0 g/dL 22.2(A) 12.2 12.7  Hematocrit 36 - 46 % 36 38.7 38.8  Platelets 150 - 399 K/L 1193(A) 1109(HH) 981(HH)    CMP     Component Value Date/Time   NA 142 05/26/2014   NA 146 05/13/2014 0336   K 4.5 05/26/2014   CL 106 05/13/2014 0336   CO2 28 05/13/2014 0336   GLUCOSE 75 05/13/2014 0336   GLUCOSE 85 01/21/2014 1123   BUN 7 05/26/2014   BUN 15 05/13/2014 0336   CREATININE 0.8 05/26/2014   CREATININE 0.78 05/13/2014 0336   CALCIUM 9.0 05/13/2014 0336   PROT 7.6 05/09/2014 1331   PROT 7.0 01/21/2014 1123   ALBUMIN 3.4* 05/09/2014 1331   AST 33 05/09/2014 1331   ALT 15 05/09/2014 1331   ALKPHOS 65 05/09/2014 1331   BILITOT 0.4 05/09/2014 1331   GFRNONAA 74* 05/13/2014 0336   GFRAA 85* 05/13/2014 0336    Lab Results  Component Value Date   HGBA1C 5.7* 01/21/2014   Lab Results  Component Value Date   TSH 1.260 05/09/2014   Lab Results  Component Value Date   INR 2.4* 07/14/2014   INR 1.80* 05/13/2014   INR 1.96* 05/12/2014   Lipid Panel     Component Value Date/Time   CHOL 137 01/16/2014   TRIG 81 01/16/2014   LDLCALC 71 01/16/2014   Lab Results  Component Value Date   INR 2.1* 07/28/2014   INR 2.4* 07/14/2014   INR 1.80* 05/13/2014   Assessment & Plan 1. Anticoagulation monitoring, INR range 2-3 Continue Coumadin 5mg  daily.  Continue to monitor  INR weekly and monitor her status.   Family/Staff Communication Plan of care discuss with nursing staff. Nursing staff verbalize understanding and agree with plan of care. No additional questions or concerns reported.    Arthur Holms, MSN, AGNP-C [facility] [address] [place], [place] [address] [phone] [8am-5pm] After hours: [phone]

## 2014-08-04 ENCOUNTER — Encounter: Payer: Self-pay | Admitting: Registered Nurse

## 2014-08-04 ENCOUNTER — Non-Acute Institutional Stay (SKILLED_NURSING_FACILITY): Payer: Medicare Other | Admitting: Registered Nurse

## 2014-08-04 DIAGNOSIS — R791 Abnormal coagulation profile: Secondary | ICD-10-CM

## 2014-08-04 LAB — POCT INR: INR: 1.6 — AB (ref <=1.1)

## 2014-08-04 NOTE — Progress Notes (Signed)
Patient ID: Tanya Farmer, female   DOB: 08-28-1926, 80 y.o.   MRN: [id number]    Place of Service: [facility]  No Known Allergies  Code Status: Full Code  Goals of Care: Longevity/LTC  Chief Complaint  Patient presents with  . Acute Visit    subtherapeutic INR    HP  80 y.o. female with PMH of chronic afib, HTN, essential thrombocytosis, dementia with Parkinsonism among others is being seen for management of warfarin therapy. Patient is on long-term warfarin anticoagulation for chronic afib and embolism of her LE. Goal INR is 2 to 3. Today INR is 1.6 and current warfarin dose is warfarin 5mg  daily. No missed or extra doses in past week reported. No recent changes in medications or diet reported. No upcoming procedure.   Review of Systems Unable to obtain due to dementia  Past Medical History  Diagnosis Date  . Hypertension   . Hyperlipidemia   . Thyroid disease     Hypothyroidism  . GERD (gastroesophageal reflux disease)   . Arthritis     Degenerative  . Peripheral vascular disease     Left   Fem-Pop and  tibial Thrombectomy  . High platelet count   . Dementia   . Pain in toe of right foot   . Hypothyroidism   . Long term (current) use of anticoagulants 03/13/2014    Past Surgical History  Procedure Laterality Date  . Femoral-popliteal bypass graft  2002    Left  Dr. Sherren Mocha Early  . Abdominal hysterectomy    . Cataract surgery      left eye    History   Social History  . Marital Status: Divorced    Spouse Name: N/A    Number of Children: N/A  . Years of Education: N/A   Occupational History  . Not on file.   Social History Main Topics  . Smoking status: Former Research scientist (life sciences)  . Smokeless tobacco: Never Used  . Alcohol Use: No  . Drug Use: No  . Sexual Activity: Not on file   Other Topics Concern  . 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    Family History  Problem  Relation Age of Onset  . Heart failure Mother   . Stroke Father       Medication List       This list is accurate as of: 08/04/14  5:34 PM.  Always use your most recent med list.               atorvastatin 10 MG tablet  Commonly known as:  LIPITOR  Take 10 mg by mouth daily.     calcium-vitamin D 500-200 MG-UNIT per tablet  Commonly known as:  OSCAL WITH D  Take 1 tablet by mouth 2 (two) times daily.     carbidopa-levodopa 25-100 MG per tablet  Commonly known as:  SINEMET IR  Take 1 tablet by mouth 2 (two) times daily.     Cranberry 125 MG Tabs  Take 250 mg by mouth daily.     donepezil 10 MG tablet  Commonly known as:  ARICEPT  Take 5 mg by mouth daily.     Fish Oil 1000 MG Caps  Take 1 capsule (1,000 mg total) by mouth daily.     hydroxyurea 500 MG capsule  Commonly known as:  HYDREA  Take 500 mg by mouth.  levothyroxine 150 MCG tablet  Commonly known as:  SYNTHROID, LEVOTHROID  Take 1 tablet (150 mcg total) by mouth daily.     losartan 50 MG tablet  Commonly known as:  COZAAR  Take 1.5 tablets (75 mg total) by mouth daily.     memantine 5 MG tablet  Commonly known as:  NAMENDA  Take 1 tablet (5 mg total) by mouth 2 (two) times daily.     MULTI COMPLETE PO  Take 1 tablet by mouth daily.     pantoprazole 20 MG tablet  Commonly known as:  PROTONIX  Take 20 mg by mouth daily.     polyethylene glycol packet  Commonly known as:  MIRALAX / GLYCOLAX  Take 17 g by mouth daily as needed for mild constipation.     saccharomyces boulardii 250 MG capsule  Commonly known as:  FLORASTOR  Take 250 mg by mouth 2 (two) times daily. For 1 week     senna-docusate 8.6-50 MG per tablet  Commonly known as:  Senokot-S  Take 1 tablet by mouth daily.     warfarin 1 MG tablet  Commonly known as:  COUMADIN  Take 5.5 mg by mouth.        Physical Exam  BP 140/80 mmHg  Pulse 66  Temp(Src) 99.2 F (37.3 C)  Resp 16  Constitutional: WDWN elderly female in no  acute distress.  HEENT: Normocephalic and atraumatic. PERRL. EOM intact. No icterus.  Neck: Supple and nontender. No lymphadenopathy, masses, or thyromegaly. No JVD or carotid bruits. Cardiac: Normal S1, S2. Irregularly irregular without appreciable murmurs, rubs, or gallops. Distal pulses intact. Trace pitting dependent edema.  Lungs: No respiratory distress. Breath sounds clear bilaterally without rales, rhonchi, or wheezes. Abdomen: Audible bowel sounds in all quadrants. Soft, nontender, nondistended. Musculoskeletal: able to move all extremities  Skin: Warm and dry. No rash noted. No erythema.  Neurological: Alert  Psychiatric: Judgment and insight adequate. Appropriate mood and affect.   Labs Reviewed  Lab Results  Component Value Date   INR 1.6* 08/04/2014   INR 2.1* 07/28/2014   INR 2.4* 07/14/2014     Assessment & Plan 1. Subtherapeutic international normalized ratio (INR) Will increase warfarin 5mg  daily to 5.5mg  daily. Continue to monitor  Recheck INR on 08/06/14   Family/Staff Communication Plan of care discussed with nursing staff. Nursing staff verbalized understanding and agree with plan of care. No additional questions or concerns reported.    Arthur Holms, MSN, AGNP-C [facility] [address] [place], [place] [address] [phone] [8am-5pm] After hours: [phone]

## 2014-08-11 ENCOUNTER — Encounter: Payer: Self-pay | Admitting: Registered Nurse

## 2014-08-11 ENCOUNTER — Non-Acute Institutional Stay (SKILLED_NURSING_FACILITY): Payer: Medicare Other | Admitting: Registered Nurse

## 2014-08-11 DIAGNOSIS — Z7901 Long term (current) use of anticoagulants: Secondary | ICD-10-CM

## 2014-08-11 LAB — POCT INR: INR: 2.6 — AB (ref 0.9–1.1)

## 2014-08-11 NOTE — Progress Notes (Signed)
Patient ID: Tanya Farmer, female   DOB: Sep 01, 1925, 80 y.o.   MRN: [id number]    Place of Service: [facility]  No Known Allergies  Code Status: Full Code  Goals of Care: Longevity/LTC  Chief Complaint  Patient presents with  . Acute Visit    coumadin management     HP  80 y.o. female with PMH of chronic afib, HTN, essential thrombocytosis, dementia with Parkinsonism among others is being seen for management of warfarin therapy. Patient is on long-term warfarin anticoagulation for chronic afib and embolism of her LE. Goal INR is 2 to 3. Today INR is 2.6 and current warfarin dose is warfarin 6mg  daily. No missed doses in past week reported. No recent changes in medications or diet reported. No upcoming procedure. No complaints verbalized by patient  Review of Systems Unable to obtain due to dementia  Past Medical History  Diagnosis Date  . Hypertension   . Hyperlipidemia   . Thyroid disease     Hypothyroidism  . GERD (gastroesophageal reflux disease)   . Arthritis     Degenerative  . Peripheral vascular disease     Left   Fem-Pop and  tibial Thrombectomy  . High platelet count   . Dementia   . Pain in toe of right foot   . Hypothyroidism   . Long term (current) use of anticoagulants 03/13/2014    Past Surgical History  Procedure Laterality Date  . Femoral-popliteal bypass graft  2002    Left  Dr. Sherren Mocha Early  . Abdominal hysterectomy    . Cataract surgery      left eye    History   Social History  . Marital Status: Divorced    Spouse Name: N/A    Number of Children: N/A  . Years of Education: N/A   Occupational History  . Not on file.   Social History Main Topics  . Smoking status: Former Research scientist (life sciences)  . Smokeless tobacco: Never Used  . Alcohol Use: No  . Drug Use: No  . Sexual Activity: Not on file   Other Topics Concern  . 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     Family History  Problem Relation Age of Onset  . Heart failure Mother   . Stroke Father       Medication List       This list is accurate as of: 08/11/14 10:02 PM.  Always use your most recent med list.               atorvastatin 10 MG tablet  Commonly known as:  LIPITOR  Take 10 mg by mouth daily.     calcium-vitamin D 500-200 MG-UNIT per tablet  Commonly known as:  OSCAL WITH D  Take 1 tablet by mouth 2 (two) times daily.     carbidopa-levodopa 25-100 MG per tablet  Commonly known as:  SINEMET IR  Take 1 tablet by mouth 2 (two) times daily.     Cranberry 125 MG Tabs  Take 250 mg by mouth daily.     donepezil 10 MG tablet  Commonly known as:  ARICEPT  Take 5 mg by mouth daily.     Fish Oil 1000 MG Caps  Take 1 capsule (1,000 mg total) by mouth daily.     hydroxyurea 500 MG capsule  Commonly known as:  HYDREA  Take 500 mg by mouth.  levothyroxine 150 MCG tablet  Commonly known as:  SYNTHROID, LEVOTHROID  Take 1 tablet (150 mcg total) by mouth daily.     losartan 50 MG tablet  Commonly known as:  COZAAR  Take 1.5 tablets (75 mg total) by mouth daily.     memantine 5 MG tablet  Commonly known as:  NAMENDA  Take 1 tablet (5 mg total) by mouth 2 (two) times daily.     MULTI COMPLETE PO  Take 1 tablet by mouth daily.     pantoprazole 20 MG tablet  Commonly known as:  PROTONIX  Take 20 mg by mouth daily.     polyethylene glycol packet  Commonly known as:  MIRALAX / GLYCOLAX  Take 17 g by mouth daily as needed for mild constipation.     saccharomyces boulardii 250 MG capsule  Commonly known as:  FLORASTOR  Take 250 mg by mouth 2 (two) times daily. For 1 week     senna-docusate 8.6-50 MG per tablet  Commonly known as:  Senokot-S  Take 1 tablet by mouth daily.     warfarin 1 MG tablet  Commonly known as:  COUMADIN  Take 5.5 mg by mouth.        Physical Exam  BP 148/88 mmHg  Pulse 72  Temp(Src) 98.3 F (36.8 C)  Resp 16  Ht 5'  (1.524 m)  Wt 130 lb 3.2 oz (59.058 kg)  BMI 25.43 kg/m2  SpO2 98%  Constitutional: WDWN elderly female in no acute distress. Pleasantly confused  HEENT: Normocephalic and atraumatic. PERRL. EOM intact. No icterus.  Neck: Supple and nontender. No lymphadenopathy, masses, or thyromegaly. No JVD or carotid bruits. Cardiac: Normal S1, S2. Irregularly irregular without appreciable murmurs, rubs, or gallops. Distal pulses intact. Trace pitting dependent edema.  Lungs: No respiratory distress. Breath sounds clear bilaterally without rales, rhonchi, or wheezes. Abdomen: Audible bowel sounds in all quadrants. Soft, nontender, nondistended. Musculoskeletal: able to move all extremities  Skin: Warm and dry. No rash noted. No erythema.  Neurological: Alert  Psychiatric: Appropriate mood and affect. Has dementia at baseline   Labs Reviewed  Lab Results  Component Value Date   INR 2.6* 08/11/2014   INR 1.6* 08/04/2014   INR 2.1* 07/28/2014    Assessment & Plan 1. Anticoagulation monitoring, INR range 2-3 INR 2.6. Will decrease coumadin to 5.5mg  daily. Continue to monitor her status.   Recheck INR on 08/18/14   Family/Staff Communication Plan of care discussed with nursing staff. Nursing staff verbalized understanding and agree with plan of care. No additional questions or concerns reported.    Arthur Holms, MSN, AGNP-C [facility] [address] [place], [place] [address] [phone] [8am-5pm] After hours: [phone]

## 2014-08-18 ENCOUNTER — Encounter: Payer: Self-pay | Admitting: Registered Nurse

## 2014-08-18 ENCOUNTER — Non-Acute Institutional Stay (SKILLED_NURSING_FACILITY): Payer: Medicare Other | Admitting: Registered Nurse

## 2014-08-18 DIAGNOSIS — E785 Hyperlipidemia, unspecified: Secondary | ICD-10-CM

## 2014-08-18 DIAGNOSIS — R791 Abnormal coagulation profile: Secondary | ICD-10-CM

## 2014-08-18 DIAGNOSIS — I480 Paroxysmal atrial fibrillation: Secondary | ICD-10-CM

## 2014-08-18 DIAGNOSIS — D473 Essential (hemorrhagic) thrombocythemia: Secondary | ICD-10-CM

## 2014-08-18 DIAGNOSIS — F0280 Dementia in other diseases classified elsewhere without behavioral disturbance: Secondary | ICD-10-CM

## 2014-08-18 DIAGNOSIS — E039 Hypothyroidism, unspecified: Secondary | ICD-10-CM

## 2014-08-18 DIAGNOSIS — K5901 Slow transit constipation: Secondary | ICD-10-CM

## 2014-08-18 DIAGNOSIS — I10 Essential (primary) hypertension: Secondary | ICD-10-CM

## 2014-08-18 DIAGNOSIS — Z0000 Encounter for general adult medical examination without abnormal findings: Secondary | ICD-10-CM

## 2014-08-18 DIAGNOSIS — G20 Parkinson's disease: Secondary | ICD-10-CM

## 2014-08-18 DIAGNOSIS — G3183 Dementia with Lewy bodies: Secondary | ICD-10-CM

## 2014-08-18 LAB — POCT INR: INR: 3.4 — AB (ref 0.9–1.1)

## 2014-08-18 NOTE — Progress Notes (Signed)
Patient ID: Tanya Farmer, female   DOB: Feb 28, 1926, 80 y.o.   MRN: [id number]   Place of Service: [facility]  No Known Allergies  Code Status: Full Code  Goals of Care: Longevity/Long term care  Chief Complaint  Patient presents with  . Annual Exam  . Acute Visit    supratherapeutic INR    HPI 80 y.o. female with PMH of afib, HTN, essential thrombocytosis, dementia with Parkinsonism, HLD among others is being seen for an annual exam and supratherapeutic INR. Weight  stable. No recent falls or skin concerns reported. No change in functional status or behaviors reported. NO concerns from staff. No concerns verbalized from patient. Afib is stable on oral anticoagulation with warfarin. HTN is stable on losartan. Hypothyroidism is stable on synthroid 112mcg daily. Up to date with her immunizations.   Review of Systems Unable to obtain due to dementia  Past Medical History  Diagnosis Date  . Hypertension   . Hyperlipidemia   . Thyroid disease     Hypothyroidism  . GERD (gastroesophageal reflux disease)   . Arthritis     Degenerative  . Peripheral vascular disease     Left   Fem-Pop and  tibial Thrombectomy  . High platelet count   . Dementia   . Pain in toe of right foot   . Hypothyroidism   . Long term (current) use of anticoagulants 03/13/2014    Past Surgical History  Procedure Laterality Date  . Femoral-popliteal bypass graft  2002    Left  Dr. Sherren Mocha Early  . Abdominal hysterectomy    . Cataract surgery      left eye    History   Social History  . Marital Status: Divorced    Spouse Name: N/A    Number of Children: N/A  . Years of Education: N/A   Occupational History  . Not on file.   Social History Main Topics  . Smoking status: Former Research scientist (life sciences)  . Smokeless tobacco: Never Used  . Alcohol Use: No  . Drug Use: No  . Sexual Activity: Not on file   Other Topics Concern  . 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      Medication List       This list is accurate as of: 08/18/14  8:56 PM.  Always use your most recent med list.               atorvastatin 10 MG tablet  Commonly known as:  LIPITOR  Take 10 mg by mouth daily.     calcium-vitamin D 500-200 MG-UNIT per tablet  Commonly known as:  OSCAL WITH D  Take 1 tablet by mouth 2 (two) times daily.     carbidopa-levodopa 25-100 MG per tablet  Commonly known as:  SINEMET IR  Take 1 tablet by mouth 2 (two) times daily.     Cranberry 125 MG Tabs  Take 250 mg by mouth daily.     donepezil 10 MG tablet  Commonly known as:  ARICEPT  Take 5 mg by mouth daily.     Fish Oil 1000 MG Caps  Take 1 capsule (1,000 mg total) by mouth daily.     hydroxyurea 500 MG capsule  Commonly known as:  HYDREA  Take 500 mg by mouth.     levothyroxine 150 MCG tablet  Commonly known as:  SYNTHROID, LEVOTHROID  Take 1 tablet (  150 mcg total) by mouth daily.     losartan 50 MG tablet  Commonly known as:  COZAAR  Take 1.5 tablets (75 mg total) by mouth daily.     memantine 5 MG tablet  Commonly known as:  NAMENDA  Take 1 tablet (5 mg total) by mouth 2 (two) times daily.     MULTI COMPLETE PO  Take 1 tablet by mouth daily.     pantoprazole 20 MG tablet  Commonly known as:  PROTONIX  Take 20 mg by mouth daily.     polyethylene glycol packet  Commonly known as:  MIRALAX / GLYCOLAX  Take 17 g by mouth daily as needed for mild constipation.     saccharomyces boulardii 250 MG capsule  Commonly known as:  FLORASTOR  Take 250 mg by mouth 2 (two) times daily. For 1 week     senna-docusate 8.6-50 MG per tablet  Commonly known as:  Senokot-S  Take 1 tablet by mouth daily.     warfarin 1 MG tablet  Commonly known as:  COUMADIN  Take 5 mg by mouth.        Physical Exam  BP 139/81 mmHg  Pulse 73  Temp(Src) 98.1 F (36.7 C)  Resp 18  Ht 5' (1.524 m)  Wt 130 lb 3.2 oz (59.058 kg)  BMI 25.43  kg/m2  SpO2 98%  Constitutional: Thin elderly female in no acute distress. Sitting comfortable in chair.  HEENT: Normocephalic and atraumatic. PERRL. EOM intact. No icterus. Oral mucosa moist. Posterior pharynx clear of any exudate or lesions.  Neck: Supple and nontender. No lymphadenopathy, masses, or thyromegaly. No JVD or carotid bruits. Cardiac: Normal S1, S2. Irregularly irregular. Distal pulses intact. Trace pitting edema of BLE Lungs: No respiratory distress. Breath sounds clear bilaterally without rales, rhonchi, or wheezes. Abdomen: Audible bowel sounds in all quadrants. Soft, nontender, nondistended.  Musculoskeletal: able to move all extremities Skin: Warm and dry. No rash noted. No erythema.  Neurological: Alert  Psychiatric: Appropriate mood and affect.   Labs Reviewed CBC Latest Ref Rng 05/26/2014 05/13/2014 05/11/2014  WBC - 4.2 3.6(L) 4.3  Hemoglobin 12.0 - 16.0 g/dL 22.2(A) 12.2 12.7  Hematocrit 36 - 46 % 36 38.7 38.8  Platelets 150 - 399 K/L 1193(A) 1109(HH) 981(HH)    CMP Latest Ref Rng 05/26/2014 05/13/2014 05/11/2014  Glucose 70 - 99 mg/dL - 75 92  BUN 4 - 21 mg/dL 7 15 13   Creatinine 0.5 - 1.1 mg/dL 0.8 0.78 0.65  Sodium 137 - 147 mmol/L 142 146 142  Potassium 3.4 - 5.3 mmol/L 4.5 3.9 4.1  Chloride 96 - 112 mEq/L - 106 104  CO2 19 - 32 mEq/L - 28 25  Calcium 8.4 - 10.5 mg/dL - 9.0 9.0  Total Protein 6.0 - 8.3 g/dL - - -  Albumin 3.5 - 4.7 g/dL - - -  Total Bilirubin 0.3 - 1.2 mg/dL - - -  Alkaline Phos 39 - 117 U/L - - -  AST 0 - 37 U/L - - -  ALT 0 - 35 U/L - - -    Lab Results  Component Value Date   HGBA1C 5.7* 01/21/2014   Lab Results  Component Value Date   TSH 1.260 05/09/2014   Lab Results  Component Value Date   INR 3.4* 08/18/2014   INR 2.6* 08/11/2014   INR 1.6* 08/04/2014   Lipid Panel     Component Value Date/Time   CHOL 137 01/16/2014   TRIG 81 01/16/2014  [place] 71 01/16/2014    Assessment & Plan 1. Paroxysmal atrial  fibrillation Stable. Rate controlled. Continue anticoagulation with coumadin. INR goal 2-3. Today INR 3.4. Will hold coumadin x 1 then restart coumadin at 5mg  daily and monitor. Recheck INR 08/25/14   2. Gastroesophageal reflux disease, esophagitis presence not specified Stable. Continue protonix 20mg  daily and monitor.   3. Slow transit constipation Stable. Continue sennakot-s 8.6/50mg  daily and miralax 17g daily as needed for constipation. Continue to monitor  4. Other specified hypothyroidism Stable. Continue synthroid 141mcg daily and monitor.   5. Dementia with Parkinsonism No issues. Continue aricept 5mg  daily, namenda xr 14mg  daily, and sinemet 25/100mg  twice daily. Continue to monitor for change in behaviors. Continue fall risk and pressure ulcer precautions.   6. Dyslipidemia Stable. LDL at goal. Continue lipitor 10mg  daily, fish oil 1g daily and monitor.   7. Essential thrombocytosis Persists. Continue hydroxyurea 1000mg  by mouth daily and monitor  8. HTN Stable. Continue losartan 75mg  daily and monitor.   9. supratherapeutic INR See #1  10. Annual Exam Up-to-date with with influenza and pneumococcal vaccines. Not a candidate for colonoscopy. Will order annual FOBT and dexa scan. Continue calcium-vitamin D 500/200 twice daily for bone health.   [facility] Continue to offer resident recommended  immunizations and screenings as appropriate for age and health status.   Family/Staff Communication Plan of care discussed with nursing staff. Nursing staff verbalized understanding and agree with plan of care. No additional questions or concerns reported.     Arthur Holms, MSN, AGNP-C [facility] [address] [place], [place] [address] [phone] [8am-5pm] After hours: [phone]

## 2014-08-25 ENCOUNTER — Non-Acute Institutional Stay (SKILLED_NURSING_FACILITY): Payer: Medicare Other | Admitting: Registered Nurse

## 2014-08-25 ENCOUNTER — Encounter: Payer: Self-pay | Admitting: Registered Nurse

## 2014-08-25 DIAGNOSIS — Z7901 Long term (current) use of anticoagulants: Secondary | ICD-10-CM

## 2014-08-25 LAB — POCT INR: INR: 2.5 — AB (ref 0.9–1.1)

## 2014-08-25 NOTE — Progress Notes (Signed)
Patient ID: Tanya Farmer, female   DOB: 1925-11-12, 80 y.o.   MRN: [id number]   Place of Service: [facility]  No Known Allergies  Code Status: Full Code  Goals of Care: Longevity/LTC  Chief Complaint  Patient presents with  . Acute Visit    coumadin management    HP  80 y.o. female with PMH of chronic afib, HTN, essential thrombocytosis, dementia with Parkinsonism among others is being seen for management of warfarin therapy. Patient is on long-term warfarin anticoagulation for chronic afib and embolism of her LE. Goal INR is 2 to 3. Today INR is 2.5 and current warfarin dose is warfarin 5mg  daily. No missed doses in past week reported. No recent changes in medications or diet reported. No upcoming procedure. No complaints verbalized by patient  Review of Systems Unable to obtain due to dementia  Past Medical History  Diagnosis Date  . Hypertension   . Hyperlipidemia   . Thyroid disease     Hypothyroidism  . GERD (gastroesophageal reflux disease)   . Arthritis     Degenerative  . Peripheral vascular disease     Left   Fem-Pop and  tibial Thrombectomy  . High platelet count   . Dementia   . Pain in toe of right foot   . Hypothyroidism   . Long term (current) use of anticoagulants 03/13/2014    Past Surgical History  Procedure Laterality Date  . Femoral-popliteal bypass graft  2002    Left  Dr. Sherren Mocha Early  . Abdominal hysterectomy    . Cataract surgery      left eye    History   Social History  . Marital Status: Divorced    Spouse Name: N/A    Number of Children: N/A  . Years of Education: N/A   Occupational History  . Not on file.   Social History Main Topics  . Smoking status: Former Research scientist (life sciences)  . Smokeless tobacco: Never Used  . Alcohol Use: No  . Drug Use: No  . Sexual Activity: Not on file   Other Topics Concern  . 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     Family History  Problem Relation Age of Onset  . Heart failure Mother   . Stroke Father       Medication List       This list is accurate as of: 08/25/14 12:38 PM.  Always use your most recent med list.               atorvastatin 10 MG tablet  Commonly known as:  LIPITOR  Take 10 mg by mouth daily.     calcium-vitamin D 500-200 MG-UNIT per tablet  Commonly known as:  OSCAL WITH D  Take 1 tablet by mouth 2 (two) times daily.     carbidopa-levodopa 25-100 MG per tablet  Commonly known as:  SINEMET IR  Take 1 tablet by mouth 2 (two) times daily.     Cranberry 125 MG Tabs  Take 250 mg by mouth daily.     donepezil 10 MG tablet  Commonly known as:  ARICEPT  Take 5 mg by mouth daily.     Fish Oil 1000 MG Caps  Take 1 capsule (1,000 mg total) by mouth daily.     hydroxyurea 500 MG capsule  Commonly known as:  HYDREA  Take 500 mg by mouth.  levothyroxine 150 MCG tablet  Commonly known as:  SYNTHROID, LEVOTHROID  Take 1 tablet (150 mcg total) by mouth daily.     losartan 50 MG tablet  Commonly known as:  COZAAR  Take 1.5 tablets (75 mg total) by mouth daily.     MULTI COMPLETE PO  Take 1 tablet by mouth daily.     NAMENDA XR 14 MG Cp24  Generic drug:  Memantine HCl ER  Take 14 mg by mouth daily.     pantoprazole 20 MG tablet  Commonly known as:  PROTONIX  Take 20 mg by mouth daily.     polyethylene glycol packet  Commonly known as:  MIRALAX / GLYCOLAX  Take 17 g by mouth daily as needed for mild constipation.     saccharomyces boulardii 250 MG capsule  Commonly known as:  FLORASTOR  Take 250 mg by mouth 2 (two) times daily. For 1 week     senna-docusate 8.6-50 MG per tablet  Commonly known as:  Senokot-S  Take 1 tablet by mouth daily.     warfarin 1 MG tablet  Commonly known as:  COUMADIN  Take 5 mg by mouth.        Physical Exam  BP 139/81 mmHg  Pulse 73  Temp(Src) 98.1 F (36.7 C)  Resp 18  Ht 5' (1.524 m)  Wt 130 lb 3.2 oz  (59.058 kg)  BMI 25.43 kg/m2  Constitutional: WDWN elderly female in no acute distress. Pleasantly confused  HEENT: Normocephalic and atraumatic. PERRL. EOM intact. No icterus.  Neck: Supple and nontender. No lymphadenopathy, masses, or thyromegaly. No JVD or carotid bruits. Cardiac: Normal S1, S2. Irregularly irregular without appreciable murmurs, rubs, or gallops. Distal pulses intact. Trace pitting dependent edema.  Lungs: No respiratory distress. Breath sounds clear bilaterally without rales, rhonchi, or wheezes. Abdomen: Audible bowel sounds in all quadrants. Soft, nontender, nondistended. Musculoskeletal: able to move all extremities  Skin: Warm and dry. No rash noted. No erythema.  Neurological: Alert  Psychiatric: Appropriate mood and affect. Has dementia at baseline   Labs Reviewed  Lab Results  Component Value Date   INR 2.5* 08/25/2014   INR 3.4* 08/18/2014   INR 2.6* 08/11/2014    Assessment & Plan 1. Anticoagulation monitoring, INR range 2-3 INR today 2.5. Will continue warfarin 5mg  daily and continue to monitor her status.   Recheck INR on 09/01/14   Family/Staff Communication Plan of care discussed with nursing staff. Nursing staff verbalized understanding and agree with plan of care. No additional questions or concerns reported.    Arthur Holms, MSN, AGNP-C [facility] [address] [place], [place] [address] [phone] [8am-5pm] After hours: [phone]

## 2014-08-31 ENCOUNTER — Inpatient Hospital Stay (HOSPITAL_COMMUNITY): Payer: Medicare Other

## 2014-08-31 ENCOUNTER — Inpatient Hospital Stay (HOSPITAL_COMMUNITY)
Admission: EM | Admit: 2014-08-31 | Discharge: 2014-09-09 | DRG: 378 | Disposition: A | Discharge status: Skilled Nursing Facility | Payer: Medicare Other | Attending: Internal Medicine | Admitting: Internal Medicine

## 2014-08-31 ENCOUNTER — Encounter (HOSPITAL_COMMUNITY): Payer: Self-pay | Admitting: Emergency Medicine

## 2014-08-31 DIAGNOSIS — N390 Urinary tract infection, site not specified: Secondary | ICD-10-CM | POA: Diagnosis present

## 2014-08-31 DIAGNOSIS — K648 Other hemorrhoids: Secondary | ICD-10-CM | POA: Diagnosis present

## 2014-08-31 DIAGNOSIS — I743 Embolism and thrombosis of arteries of the lower extremities: Secondary | ICD-10-CM | POA: Diagnosis present

## 2014-08-31 DIAGNOSIS — R630 Anorexia: Secondary | ICD-10-CM | POA: Diagnosis not present

## 2014-08-31 DIAGNOSIS — D473 Essential (hemorrhagic) thrombocythemia: Secondary | ICD-10-CM | POA: Diagnosis present

## 2014-08-31 DIAGNOSIS — I480 Paroxysmal atrial fibrillation: Secondary | ICD-10-CM | POA: Diagnosis present

## 2014-08-31 DIAGNOSIS — K922 Gastrointestinal hemorrhage, unspecified: Principal | ICD-10-CM | POA: Diagnosis present

## 2014-08-31 DIAGNOSIS — D751 Secondary polycythemia: Secondary | ICD-10-CM | POA: Diagnosis present

## 2014-08-31 DIAGNOSIS — K219 Gastro-esophageal reflux disease without esophagitis: Secondary | ICD-10-CM | POA: Diagnosis present

## 2014-08-31 DIAGNOSIS — B9689 Other specified bacterial agents as the cause of diseases classified elsewhere: Secondary | ICD-10-CM | POA: Diagnosis present

## 2014-08-31 DIAGNOSIS — G3183 Dementia with Lewy bodies: Secondary | ICD-10-CM | POA: Diagnosis present

## 2014-08-31 DIAGNOSIS — E039 Hypothyroidism, unspecified: Secondary | ICD-10-CM | POA: Diagnosis present

## 2014-08-31 DIAGNOSIS — I472 Ventricular tachycardia: Secondary | ICD-10-CM | POA: Diagnosis present

## 2014-08-31 DIAGNOSIS — F0280 Dementia in other diseases classified elsewhere without behavioral disturbance: Secondary | ICD-10-CM | POA: Diagnosis present

## 2014-08-31 DIAGNOSIS — L299 Pruritus, unspecified: Secondary | ICD-10-CM | POA: Diagnosis present

## 2014-08-31 DIAGNOSIS — I517 Cardiomegaly: Secondary | ICD-10-CM | POA: Diagnosis present

## 2014-08-31 DIAGNOSIS — Z8673 Personal history of transient ischemic attack (TIA), and cerebral infarction without residual deficits: Secondary | ICD-10-CM

## 2014-08-31 DIAGNOSIS — R410 Disorientation, unspecified: Secondary | ICD-10-CM

## 2014-08-31 DIAGNOSIS — I4581 Long QT syndrome: Secondary | ICD-10-CM | POA: Diagnosis present

## 2014-08-31 DIAGNOSIS — D75839 Thrombocytosis, unspecified: Secondary | ICD-10-CM

## 2014-08-31 DIAGNOSIS — Z515 Encounter for palliative care: Secondary | ICD-10-CM | POA: Diagnosis not present

## 2014-08-31 DIAGNOSIS — D6859 Other primary thrombophilia: Secondary | ICD-10-CM | POA: Diagnosis present

## 2014-08-31 DIAGNOSIS — I10 Essential (primary) hypertension: Secondary | ICD-10-CM | POA: Diagnosis present

## 2014-08-31 DIAGNOSIS — Z7901 Long term (current) use of anticoagulants: Secondary | ICD-10-CM | POA: Diagnosis not present

## 2014-08-31 DIAGNOSIS — Z87440 Personal history of urinary (tract) infections: Secondary | ICD-10-CM

## 2014-08-31 DIAGNOSIS — Z87891 Personal history of nicotine dependence: Secondary | ICD-10-CM

## 2014-08-31 DIAGNOSIS — Z86718 Personal history of other venous thrombosis and embolism: Secondary | ICD-10-CM | POA: Diagnosis not present

## 2014-08-31 DIAGNOSIS — E785 Hyperlipidemia, unspecified: Secondary | ICD-10-CM | POA: Diagnosis present

## 2014-08-31 DIAGNOSIS — R059 Cough, unspecified: Secondary | ICD-10-CM

## 2014-08-31 DIAGNOSIS — B9620 Unspecified Escherichia coli [E. coli] as the cause of diseases classified elsewhere: Secondary | ICD-10-CM | POA: Diagnosis present

## 2014-08-31 DIAGNOSIS — R531 Weakness: Secondary | ICD-10-CM

## 2014-08-31 DIAGNOSIS — R05 Cough: Secondary | ICD-10-CM

## 2014-08-31 HISTORY — DX: Parkinson's disease without dyskinesia, without mention of fluctuations: G20.A1

## 2014-08-31 HISTORY — DX: Parkinson's disease: G20

## 2014-08-31 HISTORY — DX: Myoneural disorder, unspecified: G70.9

## 2014-08-31 LAB — URINALYSIS, ROUTINE W REFLEX MICROSCOPIC
BILIRUBIN URINE: NEGATIVE
Glucose, UA: NEGATIVE mg/dL
HGB URINE DIPSTICK: NEGATIVE
Ketones, ur: NEGATIVE mg/dL
Nitrite: POSITIVE — AB
PROTEIN: NEGATIVE mg/dL
Specific Gravity, Urine: 1.017 (ref 1.005–1.030)
UROBILINOGEN UA: 0.2 mg/dL (ref 0.0–1.0)
pH: 7.5 (ref 5.0–8.0)

## 2014-08-31 LAB — CBC WITH DIFFERENTIAL/PLATELET
Basophils Absolute: 0.2 10*3/uL — ABNORMAL HIGH (ref 0.0–0.1)
Basophils Relative: 3 % — ABNORMAL HIGH (ref 0–1)
EOS PCT: 3 % (ref 0–5)
Eosinophils Absolute: 0.2 10*3/uL (ref 0.0–0.7)
HCT: 34.6 % — ABNORMAL LOW (ref 36.0–46.0)
Hemoglobin: 11.2 g/dL — ABNORMAL LOW (ref 12.0–15.0)
LYMPHS ABS: 1 10*3/uL (ref 0.7–4.0)
Lymphocytes Relative: 18 % (ref 12–46)
MCH: 33.6 pg (ref 26.0–34.0)
MCHC: 32.4 g/dL (ref 30.0–36.0)
MCV: 103.9 fL — ABNORMAL HIGH (ref 78.0–100.0)
MONOS PCT: 6 % (ref 3–12)
Monocytes Absolute: 0.3 10*3/uL (ref 0.1–1.0)
Neutro Abs: 4.1 10*3/uL (ref 1.7–7.7)
Neutrophils Relative %: 70 % (ref 43–77)
Platelets: 1034 10*3/uL (ref 150–400)
RBC: 3.33 MIL/uL — ABNORMAL LOW (ref 3.87–5.11)
RDW: 17.9 % — ABNORMAL HIGH (ref 11.5–15.5)
WBC: 5.8 10*3/uL (ref 4.0–10.5)

## 2014-08-31 LAB — PROTIME-INR
INR: 2.42 — AB (ref 0.00–1.49)
PROTHROMBIN TIME: 26.5 s — AB (ref 11.6–15.2)

## 2014-08-31 LAB — ABO/RH: ABO/RH(D): B POS

## 2014-08-31 LAB — CBC
HEMATOCRIT: 34.1 % — AB (ref 36.0–46.0)
Hemoglobin: 10.9 g/dL — ABNORMAL LOW (ref 12.0–15.0)
MCH: 32.9 pg (ref 26.0–34.0)
MCHC: 32 g/dL (ref 30.0–36.0)
MCV: 103 fL — ABNORMAL HIGH (ref 78.0–100.0)
Platelets: 1044 10*3/uL (ref 150–400)
RBC: 3.31 MIL/uL — ABNORMAL LOW (ref 3.87–5.11)
RDW: 17.6 % — ABNORMAL HIGH (ref 11.5–15.5)
WBC: 6.4 10*3/uL (ref 4.0–10.5)

## 2014-08-31 LAB — I-STAT CHEM 8, ED
BUN: 17 mg/dL (ref 6–23)
CALCIUM ION: 1.18 mmol/L (ref 1.13–1.30)
CHLORIDE: 110 meq/L (ref 96–112)
Creatinine, Ser: 0.9 mg/dL (ref 0.50–1.10)
GLUCOSE: 89 mg/dL (ref 70–99)
HCT: 38 % (ref 36.0–46.0)
Hemoglobin: 12.9 g/dL (ref 12.0–15.0)
Potassium: 3.5 mmol/L (ref 3.5–5.1)
Sodium: 144 mmol/L (ref 135–145)
TCO2: 23 mmol/L (ref 0–100)

## 2014-08-31 LAB — URINE MICROSCOPIC-ADD ON

## 2014-08-31 LAB — TYPE AND SCREEN
ABO/RH(D): B POS
ANTIBODY SCREEN: NEGATIVE

## 2014-08-31 LAB — MAGNESIUM: Magnesium: 1.9 mg/dL (ref 1.5–2.5)

## 2014-08-31 MED ORDER — SODIUM CHLORIDE 0.9 % IJ SOLN
3.0000 mL | Freq: Two times a day (BID) | INTRAMUSCULAR | Status: DC
  Administered 2014-09-01 – 2014-09-09 (×7): 3 mL via INTRAVENOUS

## 2014-08-31 MED ORDER — HYDROCODONE-ACETAMINOPHEN 5-325 MG PO TABS: 1.0000 | ORAL_TABLET | ORAL | Status: DC | PRN

## 2014-08-31 MED ORDER — PANTOPRAZOLE SODIUM 40 MG IV SOLR
40.0000 mg | Freq: Two times a day (BID) | INTRAVENOUS | Status: DC
  Administered 2014-08-31 – 2014-09-02 (×4): 40 mg via INTRAVENOUS
  Filled 2014-08-31 (×6): qty 40

## 2014-08-31 MED ORDER — CARBIDOPA-LEVODOPA 25-100 MG PO TABS
1.0000 | ORAL_TABLET | Freq: Two times a day (BID) | ORAL | Status: DC
  Administered 2014-08-31 – 2014-09-09 (×18): 1 via ORAL
  Filled 2014-08-31 (×19): qty 1

## 2014-08-31 MED ORDER — DEXTROSE-NACL 5-0.9 % IV SOLN
INTRAVENOUS | Status: DC
  Administered 2014-08-31: 75 mL/h via INTRAVENOUS
  Administered 2014-09-02 – 2014-09-06 (×3): via INTRAVENOUS

## 2014-08-31 MED ORDER — ACETAMINOPHEN 650 MG RE SUPP: 650.0000 mg | Freq: Four times a day (QID) | RECTAL | Status: DC | PRN

## 2014-08-31 MED ORDER — DEXTROSE 5 % IV SOLN
1.0000 g | INTRAVENOUS | Status: DC
  Administered 2014-08-31 – 2014-09-02 (×3): 1 g via INTRAVENOUS
  Filled 2014-08-31 (×4): qty 10

## 2014-08-31 MED ORDER — HYDRALAZINE HCL 20 MG/ML IJ SOLN
10.0000 mg | Freq: Three times a day (TID) | INTRAMUSCULAR | Status: DC | PRN
  Administered 2014-08-31 – 2014-09-03 (×3): 10 mg via INTRAVENOUS
  Filled 2014-08-31 (×3): qty 1

## 2014-08-31 MED ORDER — ONDANSETRON HCL 4 MG PO TABS: 4.0000 mg | ORAL_TABLET | Freq: Four times a day (QID) | ORAL | Status: DC | PRN

## 2014-08-31 MED ORDER — MORPHINE SULFATE 2 MG/ML IJ SOLN: 1.0000 mg | INTRAMUSCULAR | Status: DC | PRN

## 2014-08-31 MED ORDER — HYDROXYUREA 500 MG PO CAPS
1000.0000 mg | ORAL_CAPSULE | Freq: Every day | ORAL | Status: DC
  Administered 2014-08-31 – 2014-09-09 (×10): 1000 mg via ORAL
  Filled 2014-08-31 (×11): qty 2

## 2014-08-31 MED ORDER — ACETAMINOPHEN 325 MG PO TABS: 650.0000 mg | ORAL_TABLET | Freq: Four times a day (QID) | ORAL | Status: DC | PRN

## 2014-08-31 MED ORDER — LEVOTHYROXINE SODIUM 150 MCG PO TABS
150.0000 ug | ORAL_TABLET | Freq: Every day | ORAL | Status: DC
  Administered 2014-09-01 – 2014-09-07 (×6): 150 ug via ORAL
  Filled 2014-08-31 (×8): qty 1

## 2014-08-31 MED ORDER — PRO-STAT SUGAR FREE PO LIQD
30.0000 mL | Freq: Every day | ORAL | Status: DC
  Administered 2014-09-01 – 2014-09-09 (×6): 30 mL via ORAL
  Filled 2014-08-31 (×9): qty 30

## 2014-08-31 MED ORDER — ATORVASTATIN CALCIUM 10 MG PO TABS
10.0000 mg | ORAL_TABLET | Freq: Every day | ORAL | Status: DC
  Administered 2014-08-31 – 2014-09-09 (×7): 10 mg via ORAL
  Filled 2014-08-31 (×11): qty 1

## 2014-08-31 MED ORDER — ONDANSETRON HCL 4 MG/2ML IJ SOLN: 4.0000 mg | Freq: Four times a day (QID) | INTRAMUSCULAR | Status: DC | PRN

## 2014-08-31 NOTE — ED Notes (Signed)
Paged Dr. Amedeo Plenty to [phone]

## 2014-08-31 NOTE — ED Notes (Signed)
Attempted to call report again, placed on hold and no one ever picked up.

## 2014-08-31 NOTE — ED Notes (Signed)
Attempted to give report to 5W. Was placed on hold and no one ever picked up, hung up after 10 minutes. Called back and no one answered at all.

## 2014-08-31 NOTE — ED Notes (Signed)
Spoke with lab, will draw type and screen.

## 2014-08-31 NOTE — ED Notes (Signed)
Spoke with 5W charge. Charge believes pt to be more appropriate for 4N, is trying to defer pt to 4N.

## 2014-08-31 NOTE — ED Notes (Signed)
Attempted to give report once again placed on hold and no one has picked up. Told secretary if receiving nurse could not take report then I needed to speak with their charge. Also said if no one could take report that I would be forced to do bedside report.

## 2014-08-31 NOTE — H&P (Addendum)
[facility] History and Physical  KAYTI POSS DJM:[id number] DOB: [birth year] DOA: 08/31/2014  Referring physician: ED Physician.  PCP: Blanchie Serve, MD   Chief Complaint: bloody stool.   HPI: Tanya Farmer is a 81 y.o. female with PMH significant for Hypothyroidism, A fib, history of DVT on coumadin who was transfer from SNF due to blood in the stool. Per report patient had Bloody stool night prior to admission and the morning of admission. Rectal exam perform by ED physician showed dark maroon stool, occult blood positive.  Patient is on coumadin, INR at 2.4.   Per daughter patient has dementia, but is acting different. Trying to reach things with her hand. Doesn't look her self.     Review of Systems:  Unable to obtain form patient   Past Medical History  Diagnosis Date  . Hypertension   . Hyperlipidemia   . Thyroid disease     Hypothyroidism  . GERD (gastroesophageal reflux disease)   . Arthritis     Degenerative  . Peripheral vascular disease     Left   Fem-Pop and  tibial Thrombectomy  . High platelet count   . Dementia   . Pain in toe of right foot   . Hypothyroidism   . Long term (current) use of anticoagulants 03/13/2014   Past Surgical History  Procedure Laterality Date  . Femoral-popliteal bypass graft  2002    Left  Dr. Sherren Mocha Early  . Abdominal hysterectomy    . Cataract surgery      left eye   Social History:  reports that she has quit smoking. She has never used smokeless tobacco. She reports that she does not drink alcohol or use illicit drugs.  No Known Allergies  Family History  Problem Relation Age of Onset  . Heart failure Mother   . Stroke Father      Prior to Admission medications   Medication Sig Start Date End Date Taking? Authorizing Provider  Amino Acids-Protein Hydrolys (FEEDING SUPPLEMENT, PRO-STAT SUGAR FREE 64,) LIQD Take 30 mLs by mouth daily.   Yes Historical Provider, MD  aspirin 81 MG chewable tablet Chew 81 mg by mouth  daily.   Yes Historical Provider, MD  atorvastatin (LIPITOR) 10 MG tablet Take 10 mg by mouth daily.   Yes Historical Provider, MD  calcium-vitamin D (OSCAL WITH D) 500-200 MG-UNIT per tablet Take 1 tablet by mouth 2 (two) times daily.   Yes Historical Provider, MD  carbidopa-levodopa (SINEMET IR) 25-100 MG per tablet Take 1 tablet by mouth 2 (two) times daily. 08/26/14  Yes Historical Provider, MD  Cranberry 250 MG CAPS Take 250 mg by mouth daily.   Yes Historical Provider, MD  donepezil (ARICEPT) 10 MG tablet Take 5 mg by mouth at bedtime.    Yes Historical Provider, MD  hydroxyurea (HYDREA) 500 MG capsule Take 1,000 mg by mouth daily.  10/28/10  Yes Historical Provider, MD  levothyroxine (SYNTHROID, LEVOTHROID) 150 MCG tablet Take 1 tablet (150 mcg total) by mouth daily. 04/10/14  Yes Gerlene Fee, NP  losartan (COZAAR) 50 MG tablet Take 1.5 tablets (75 mg total) by mouth daily. 12/18/13  Yes Gerlene Fee, NP  memantine (NAMENDA) 5 MG tablet Take 5 mg by mouth 2 (two) times daily.   Yes Historical Provider, MD  Multiple Vitamins-Minerals (MULTI COMPLETE PO) Take 1 tablet by mouth daily.   Yes Historical Provider, MD  Omega-3 Fatty Acids (FISH OIL) 1000 MG CAPS Take 1 capsule (1,000 mg total)  by mouth daily. 01/26/14  Yes Gerlene Fee, NP  pantoprazole (PROTONIX) 20 MG tablet Take 20 mg by mouth daily.   Yes Historical Provider, MD  polyethylene glycol (MIRALAX / GLYCOLAX) packet Take 17 g by mouth daily as needed for mild constipation. Patient taking differently: Take 17 g by mouth daily.  05/13/14  Yes Kelvin Cellar, MD  senna-docusate (SENOKOT-S) 8.6-50 MG per tablet Take 1 tablet by mouth daily.   Yes Historical Provider, MD  warfarin (COUMADIN) 5 MG tablet Take 5 mg by mouth daily at 6 PM.   Yes Historical Provider, MD   Physical Exam: Filed Vitals:   08/31/14 1245 08/31/14 1315 08/31/14 1345 08/31/14 1500  BP: 170/61 168/69 168/64 170/83  Pulse: 37  62   Temp:      TempSrc:        Resp: 13 11 13 14   SpO2: 98%  100%     Wt Readings from Last 3 Encounters:  08/25/14 59.058 kg (130 lb 3.2 oz)  08/18/14 59.058 kg (130 lb 3.2 oz)  08/11/14 59.058 kg (130 lb 3.2 oz)    General:  Appears calm and comfortable Eyes: PERRL, normal lids, irises & conjunctiva ENT: grossly normal hearing, lips & tongue Neck: no LAD, masses or thyromegaly Cardiovascular: RRR, no m/r/g. No LE edema. Respiratory: CTA bilaterally, no w/r/r. Normal respiratory effort. Abdomen: soft, ntnd Skin: no rash or induration seen on limited exam Musculoskeletal: grossly normal tone BUE/BLE Neurologic: she is alert, she would answer some question, confused, able to move all 4 extremities.           Labs on Admission:  Basic Metabolic Panel:  Recent Labs Lab 08/31/14 1039  NA 144  K 3.5  CL 110  GLUCOSE 89  BUN 17  CREATININE 0.90   Liver Function Tests: No results for input(s): AST, ALT, ALKPHOS, BILITOT, PROT, ALBUMIN in the last 168 hours. No results for input(s): LIPASE, AMYLASE in the last 168 hours. No results for input(s): AMMONIA in the last 168 hours. CBC:  Recent Labs Lab 08/31/14 1000 08/31/14 1039  WBC 5.8  --   NEUTROABS 4.1  --   HGB 11.2* 12.9  HCT 34.6* 38.0  MCV 103.9*  --   PLT 1034*  --    Cardiac Enzymes: No results for input(s): CKTOTAL, CKMB, CKMBINDEX, TROPONINI in the last 168 hours.  BNP (last 3 results)  Recent Labs  09/16/13 1730  PROBNP 619.3*   CBG: No results for input(s): GLUCAP in the last 168 hours.  Radiological Exams on Admission: No results found.  EKG: Independently reviewed. A fib, prolong QT.   Assessment/Plan Active Problems:   Acute lower GI bleeding   GI bleed  1-GI bleed; admit to telemetry. Start Protonix IV BID. Cycle hb. Hold coumadin.  If patient has significant drop in hb and active bleeding. She might need FFP and blood transfusion. Patient daughter with concern that patient is acting different. I will order MRI.   GI consulted.   2-Dementia; per daughter patient is different, trying to reach thing over. This could be just delirium. Will check MRI rule out stroke. Continue with Sinemet.   3-HTN; PRN hydralazine for SBP more than 160.   4-A fib; on coumadin. Hold anticoagulation in setting of bleed.   5-Prolog QT; hold Aricept, nemanda. Check mg level.   6-Hypothyroidism; continue with synthroid.   7-vaginal itching; per Daughter this problem for months. Has use antifungal. Need outpatient GYN.  8-Pyuria; positive nitrates. Start ceftriaxone.  Follow urine culture.    Code Status: full code.  DVT Prophylaxis:SCD Family Communication: Daughter at bedside.  Disposition Plan: expect 3 to 4 days inpatient.   Time spent: 75 minutes.   Niel Hummer A [facility] Pager [phone]

## 2014-08-31 NOTE — ED Notes (Signed)
Hospitalist at bedside 

## 2014-08-31 NOTE — ED Notes (Signed)
Report given to [employer] Tech to transport.

## 2014-08-31 NOTE — Consult Note (Signed)
[facility] Consult Note  Referring Provider: No ref. provider found Primary Care Physician:  Tanya Serve, MD Primary Gastroenterologist:  Dr.  Laurel Farmer Complaint: rectal bleeding HPI: Tanya Farmer is an 81 y.o. black female  with a history of dementia possible hypercoagulable state, thrombocytosis,  recurrent UTIs resident of Tanya Farmer was sent by caregivers due to rectal bleeding. This described as bright red to the emergency room. The patient is somnolent but denies any abdominal pain. She apparently saw Tanya Farmer in 2014 for some intermittent rectal bleeding and he followed her for a while and later had Hemoccults which were negative and a decided not to proceed with any further workup. There is no reported melena or hemodynamic instability or any nausea or vomiting. She is on Coumadin for her hypercoagulable state and sees a hematologist and [facility].  Past Medical History  Diagnosis Date  . Hypertension   . Hyperlipidemia   . Thyroid disease     Hypothyroidism  . GERD (gastroesophageal reflux disease)   . Arthritis     Degenerative  . Peripheral vascular disease     Left   Fem-Pop and  tibial Thrombectomy  . High platelet count   . Dementia   . Pain in toe of right foot   . Hypothyroidism   . Long term (current) use of anticoagulants 03/13/2014    Past Surgical History  Procedure Laterality Date  . Femoral-popliteal bypass graft  2002    Left  Dr. Sherren Farmer Early  . Abdominal hysterectomy    . Cataract surgery      left eye     (Not in a hospital admission)  Allergies: No Known Allergies  Family History  Problem Relation Age of Onset  . Heart failure Mother   . Stroke Father     Social History:  reports that she has quit smoking. She has never used smokeless tobacco. She reports that she does not drink alcohol or use illicit drugs.  Review of Systems: negative except as above   Blood pressure 171/75, pulse 60, temperature 98.5 F (36.9 C),  temperature source Oral, resp. rate 23, SpO2 100 %. Head: Somnolent, speaks a few words. Normocephalic, without obvious abnormality, atraumatic Neck: no adenopathy, no carotid bruit, no JVD, supple, symmetrical, trachea midline and thyroid not enlarged, symmetric, no tenderness/mass/nodules Resp: clear to auscultation bilaterally Cardio: regular rate and rhythm, S1, S2 normal, no murmur, click, rub or gallop GI: Abdomen soft nondistended with normoactive bowel sounds no hepatomegaly masses or guarding Extremities: extremities normal, atraumatic, no cyanosis or edema  Results for orders placed or performed during the hospital encounter of 08/31/14 (from the past 48 hour(s))  CBC with Differential     Status: Abnormal   Collection Time: 08/31/14 10:00 AM  Result Value Ref Range   WBC 5.8 4.0 - 10.5 K/uL   RBC 3.33 (L) 3.87 - 5.11 MIL/uL   Hemoglobin 11.2 (L) 12.0 - 15.0 g/dL   HCT 34.6 (L) 36.0 - 46.0 %   MCV 103.9 (H) 78.0 - 100.0 fL   MCH 33.6 26.0 - 34.0 pg   MCHC 32.4 30.0 - 36.0 g/dL   RDW 17.9 (H) 11.5 - 15.5 %   Platelets 1034 (HH) 150 - 400 K/uL    Comment: REPEATED TO VERIFY PLATELET COUNT CONFIRMED BY SMEAR CRITICAL RESULT CALLED TO, READ BACK BY AND VERIFIED WITH: Tanya A.,RN 01.03.016 1214 BY Tanya Farmer    Neutrophils Relative % 70 43 - 77 %   Lymphocytes Relative 18  12 - 46 %   Monocytes Relative 6 3 - 12 %   Eosinophils Relative 3 0 - 5 %   Basophils Relative 3 (H) 0 - 1 %   Neutro Abs 4.1 1.7 - 7.7 K/uL   Lymphs Abs 1.0 0.7 - 4.0 K/uL   Monocytes Absolute 0.3 0.1 - 1.0 K/uL   Eosinophils Absolute 0.2 0.0 - 0.7 K/uL   Basophils Absolute 0.2 (H) 0.0 - 0.1 K/uL   RBC Morphology POLYCHROMASIA PRESENT   Protime-INR     Status: Abnormal   Collection Time: 08/31/14 10:00 AM  Result Value Ref Range   Prothrombin Time 26.5 (H) 11.6 - 15.2 seconds   INR 2.42 (H) 0.00 - 1.49  I-stat chem 8, ed     Status: None   Collection Time: 08/31/14 10:39 AM  Result Value Ref Range    Sodium 144 135 - 145 mmol/L   Potassium 3.5 3.5 - 5.1 mmol/L   Chloride 110 96 - 112 mEq/L   BUN 17 6 - 23 mg/dL   Creatinine, Ser 0.90 0.50 - 1.10 mg/dL   Glucose, Bld 89 70 - 99 mg/dL   Calcium, Ion 1.18 1.13 - 1.30 mmol/L   TCO2 23 0 - 100 mmol/L   Hemoglobin 12.9 12.0 - 15.0 g/dL   HCT 38.0 36.0 - 46.0 %  Type and screen     Status: None   Collection Time: 08/31/14 11:26 AM  Result Value Ref Range   ABO/RH(D) B POS    Antibody Screen NEG    Sample Expiration 09/03/2014   ABO/Rh     Status: None   Collection Time: 08/31/14 11:26 AM  Result Value Ref Range   ABO/RH(D) B POS   Urinalysis, Routine w reflex microscopic     Status: Abnormal   Collection Time: 08/31/14  1:32 PM  Result Value Ref Range   Color, Urine YELLOW YELLOW   APPearance CLOUDY (A) CLEAR   Specific Gravity, Urine 1.017 1.005 - 1.030   pH 7.5 5.0 - 8.0   Glucose, UA NEGATIVE NEGATIVE mg/dL   Hgb urine dipstick NEGATIVE NEGATIVE   Bilirubin Urine NEGATIVE NEGATIVE   Ketones, ur NEGATIVE NEGATIVE mg/dL   Protein, ur NEGATIVE NEGATIVE mg/dL   Urobilinogen, UA 0.2 0.0 - 1.0 mg/dL   Nitrite POSITIVE (A) NEGATIVE   Leukocytes, UA TRACE (A) NEGATIVE  Urine microscopic-add on     Status: Abnormal   Collection Time: 08/31/14  1:32 PM  Result Value Ref Range   Squamous Epithelial / LPF FEW (A) RARE   WBC, UA 3-6 <3 WBC/hpf   Bacteria, UA MANY (A) RARE   Casts HYALINE CASTS (A) NEGATIVE   No results found.  Assessment: New onset of rectal bleeding, painless appears to be lower source suspected diverticulosis Plan:  Monitor stools and hemoglobin and based on dementia and other problems revisit level of aggressiveness of workup particularly regarding colonoscopy which her daughter does not think she would tolerate a prep for. Will treat empirically with a PPI Tanya Farmer C 08/31/2014, 3:20 PM

## 2014-08-31 NOTE — ED Provider Notes (Signed)
CSN: [id number]     Arrival date & time 08/31/14  0946 History   First MD Initiated Contact with Patient 08/31/14 [phone]     Chief Complaint  Patient presents with  . GI Bleeding     (Consider location/radiation/quality/duration/timing/severity/associated sxs/prior Treatment) Patient is a 81 y.o. female presenting with hematochezia. The history is provided by the EMS personnel and the nursing home. The history is limited by the absence of a caregiver.  Rectal Bleeding Quality:  Bright red Amount:  Unable to specify Duration: started yesterday. Timing:  Unable to specify Progression:  Unchanged Chronicity:  New Context comment:  Nursing home states last night when they changed pt's diaper there was blood in the stool and then again this morning Similar prior episodes: no   Relieved by:  None tried Worsened by:  Nothing tried Associated symptoms: no abdominal pain, no hematemesis and no vomiting   Risk factors: anticoagulant use   Risk factors: no hx of colorectal cancer and no hx of colorectal surgery     Past Medical History  Diagnosis Date  . Hypertension   . Hyperlipidemia   . Thyroid disease     Hypothyroidism  . GERD (gastroesophageal reflux disease)   . Arthritis     Degenerative  . Peripheral vascular disease     Left   Fem-Pop and  tibial Thrombectomy  . High platelet count   . Dementia   . Pain in toe of right foot   . Hypothyroidism   . Long term (current) use of anticoagulants 03/13/2014   Past Surgical History  Procedure Laterality Date  . Femoral-popliteal bypass graft  2002    Left  Dr. Sherren Mocha Early  . Abdominal hysterectomy    . Cataract surgery      left eye   Family History  Problem Relation Age of Onset  . Heart failure Mother   . Stroke Father    History  Substance Use Topics  . Smoking status: Former Research scientist (life sciences)  . Smokeless tobacco: Never Used  . Alcohol Use: No   OB History    No data available     Review of Systems  Unable to perform  ROS Gastrointestinal: Positive for hematochezia. Negative for vomiting, abdominal pain and hematemesis.      Allergies  Review of patient's allergies indicates no known allergies.  Home Medications   Prior to Admission medications   Medication Sig Start Date End Date Taking? Authorizing Provider  atorvastatin (LIPITOR) 10 MG tablet Take 10 mg by mouth daily.    Historical Provider, MD  calcium-vitamin D (OSCAL WITH D) 500-200 MG-UNIT per tablet Take 1 tablet by mouth 2 (two) times daily.    Historical Provider, MD  carbidopa-levodopa (SINEMET IR) 25-100 MG per tablet Take 1 tablet by mouth 2 (two) times daily.    Historical Provider, MD  Cranberry 125 MG TABS Take 250 mg by mouth daily. 01/22/14   Kristen N Ward, DO  donepezil (ARICEPT) 10 MG tablet Take 5 mg by mouth daily.     Historical Provider, MD  hydroxyurea (HYDREA) 500 MG capsule Take 500 mg by mouth. 10/28/10   Historical Provider, MD  levothyroxine (SYNTHROID, LEVOTHROID) 150 MCG tablet Take 1 tablet (150 mcg total) by mouth daily. 04/10/14   Gerlene Fee, NP  losartan (COZAAR) 50 MG tablet Take 1.5 tablets (75 mg total) by mouth daily. 12/18/13   Gerlene Fee, NP  Memantine HCl ER (NAMENDA XR) 14 MG CP24 Take 14 mg by mouth daily.  Historical Provider, MD  Multiple Vitamins-Minerals (MULTI COMPLETE PO) Take 1 tablet by mouth daily.    Historical Provider, MD  Omega-3 Fatty Acids (FISH OIL) 1000 MG CAPS Take 1 capsule (1,000 mg total) by mouth daily. 01/26/14   Gerlene Fee, NP  pantoprazole (PROTONIX) 20 MG tablet Take 20 mg by mouth daily.    Historical Provider, MD  polyethylene glycol (MIRALAX / GLYCOLAX) packet Take 17 g by mouth daily as needed for mild constipation. 05/13/14   Kelvin Cellar, MD  saccharomyces boulardii (FLORASTOR) 250 MG capsule Take 250 mg by mouth 2 (two) times daily. For 1 week    Historical Provider, MD  senna-docusate (SENOKOT-S) 8.6-50 MG per tablet Take 1 tablet by mouth daily.    Historical  Provider, MD  warfarin (COUMADIN) 1 MG tablet Take 5 mg by mouth.     Historical Provider, MD   BP 155/72 mmHg  Pulse 68  Temp(Src) 98.5 F (36.9 C) (Oral)  Resp 18  SpO2 99% Physical Exam  Constitutional: She appears well-developed and well-nourished. No distress.  Pt comfortable  HENT:  Head: Normocephalic and atraumatic.  Mouth/Throat: Oropharynx is clear and moist.  Eyes: Conjunctivae and EOM are normal. Pupils are equal, round, and reactive to light.  Neck: Normal range of motion. Neck supple.  Cardiovascular: Normal rate, regular rhythm and intact distal pulses.   No murmur heard. Pulmonary/Chest: Effort normal and breath sounds normal. No respiratory distress. She has no wheezes. She has no rales.  Abdominal: Soft. She exhibits no distension. There is no tenderness. There is no rebound and no guarding.  Genitourinary: Rectal exam shows no external hemorrhoid and no tenderness. Guaiac positive stool.  Hematochezia  Musculoskeletal: Normal range of motion. She exhibits no edema or tenderness.  Neurological: She is alert.  Will answer yes and no questions.  Oriented to self only.    Skin: Skin is warm and dry. No rash noted. No erythema.  Psychiatric: She has a normal mood and affect. Her behavior is normal.  Nursing note and vitals reviewed.   ED Course  Procedures (including critical care time) Labs Review Labs Reviewed  CBC WITH DIFFERENTIAL - Abnormal; Notable for the following:    RBC 3.33 (*)    Hemoglobin 11.2 (*)    HCT 34.6 (*)    MCV 103.9 (*)    RDW 17.9 (*)    All other components within normal limits  PROTIME-INR - Abnormal; Notable for the following:    Prothrombin Time 26.5 (*)    INR 2.42 (*)    All other components within normal limits  I-STAT CHEM 8, ED  TYPE AND SCREEN    Imaging Review No results found.   EKG Interpretation   Date/Time:  Sunday August 31 2014 10:51:07 EST Ventricular Rate:  75 PR Interval:  153 QRS Duration: 76 QT  Interval:  566 QTC Calculation: 632 R Axis:   -21 Text Interpretation:  Atrial fibrillation Borderline left axis deviation  Anteroseptal infarct, age indeterminate Prolonged QT interval No  significant change since last tracing Confirmed by Maryan Rued  MD, Loree Fee  [phone]) on 08/31/2014 11:43:39 AM      MDM   Final diagnoses:  None    Patient sent from nursing facility for bloody stool that started yesterday. Patient had 2 episodes of the bloody diaper. Unclear if this is new. Patient does take Coumadin no prior history of GI bleeding. She has dementia and is unable to give any history. When asked of her abdomen  hurts she says no. She appears in no acute distress and is hemodynamically stable. On exam patient has no abdominal tenderness however rectal exam shows hematochezia that is heme positive.  CBC, i-STAT Chem-8, INR, EKG pending  12:04 PM Currently hemoglobin is stable and creatinine is normal. INR is 2.4. Will admit for observation to ensure hemoglobin does not drop and stools clear.  Blanchie Dessert, MD 08/31/14 1205

## 2014-08-31 NOTE — ED Notes (Signed)
Waited 5 minutes on phone for 5W RN. No one ever picked up the phone. Charge notified.

## 2014-08-31 NOTE — ED Notes (Signed)
Pt presents to ED from [facility] home. Per EMS, EMS did not get "good report from staff." Per EMS pt was being changed last night at nursing home and CNA noticed some bright red blood in stool. This morning, while being changed again, brief was "full of bright red blood." When EMS tried to confirm the amount of blood noticed the nurse stated "enough that made the doctor want [place] to call you guys." Pt is dementia pt and is said to be at her baseline, does not answer questions but is alert and looking around room. Does not appear to be in any pain.

## 2014-09-01 DIAGNOSIS — Z7189 Other specified counseling: Secondary | ICD-10-CM

## 2014-09-01 LAB — CBC
HCT: 34.2 % — ABNORMAL LOW (ref 36.0–46.0)
HEMATOCRIT: 31.9 % — AB (ref 36.0–46.0)
HEMATOCRIT: 35.5 % — AB (ref 36.0–46.0)
HEMATOCRIT: 36.4 % (ref 36.0–46.0)
HEMOGLOBIN: 11.1 g/dL — AB (ref 12.0–15.0)
Hemoglobin: 12 g/dL (ref 12.0–15.0)
Hemoglobin: 10.8 g/dL — ABNORMAL LOW (ref 12.0–15.0)
Hemoglobin: 10.3 g/dL — ABNORMAL LOW (ref 12.0–15.0)
MCH: 32.2 pg (ref 26.0–34.0)
MCH: 32.4 pg (ref 26.0–34.0)
MCH: 33.6 pg (ref 26.0–34.0)
MCH: 33.6 pg (ref 26.0–34.0)
MCHC: 31.3 g/dL (ref 30.0–36.0)
MCHC: 31.6 g/dL (ref 30.0–36.0)
MCHC: 32.3 g/dL (ref 30.0–36.0)
MCHC: 33 g/dL (ref 30.0–36.0)
MCV: 102 fL — ABNORMAL HIGH (ref 78.0–100.0)
MCV: 102.1 fL — ABNORMAL HIGH (ref 78.0–100.0)
MCV: 103.5 fL — ABNORMAL HIGH (ref 78.0–100.0)
MCV: 103.9 fL — ABNORMAL HIGH (ref 78.0–100.0)
PLATELETS: 970 10*3/uL — AB (ref 150–400)
Platelets: 1047 10*3/uL (ref 150–400)
Platelets: 912 10*3/uL (ref 150–400)
Platelets: 933 10*3/uL (ref 150–400)
RBC: 3.07 MIL/uL — ABNORMAL LOW (ref 3.87–5.11)
RBC: 3.35 MIL/uL — AB (ref 3.87–5.11)
RBC: 3.43 MIL/uL — AB (ref 3.87–5.11)
RBC: 3.57 MIL/uL — ABNORMAL LOW (ref 3.87–5.11)
RDW: 17.6 % — ABNORMAL HIGH (ref 11.5–15.5)
RDW: 17.7 % — AB (ref 11.5–15.5)
RDW: 17.7 % — ABNORMAL HIGH (ref 11.5–15.5)
RDW: 17.9 % — AB (ref 11.5–15.5)
WBC: 4.7 10*3/uL (ref 4.0–10.5)
WBC: 5.5 10*3/uL (ref 4.0–10.5)
WBC: 5.9 10*3/uL (ref 4.0–10.5)
WBC: 6.1 10*3/uL (ref 4.0–10.5)

## 2014-09-01 LAB — COMPREHENSIVE METABOLIC PANEL
ALT: 6 U/L (ref 0–35)
AST: 32 U/L (ref 0–37)
Albumin: 3.5 g/dL (ref 3.5–5.2)
Alkaline Phosphatase: 51 U/L (ref 39–117)
Anion gap: 6 (ref 5–15)
BUN: 11 mg/dL (ref 6–23)
CALCIUM: 9.2 mg/dL (ref 8.4–10.5)
CO2: 27 mmol/L (ref 19–32)
Chloride: 109 mEq/L (ref 96–112)
Creatinine, Ser: 0.81 mg/dL (ref 0.50–1.10)
GFR calc non Af Amer: 64 mL/min — ABNORMAL LOW (ref >90)
GFR, EST AFRICAN AMERICAN: 74 mL/min — AB (ref >90)
GLUCOSE: 90 mg/dL (ref 70–99)
Potassium: 4 mmol/L (ref 3.5–5.1)
SODIUM: 142 mmol/L (ref 135–145)
TOTAL PROTEIN: 7.1 g/dL (ref 6.0–8.3)
Total Bilirubin: 0.6 mg/dL (ref 0.3–1.2)

## 2014-09-01 LAB — PATHOLOGIST SMEAR REVIEW: Path Review: INCREASED

## 2014-09-01 MED ORDER — FLUCONAZOLE IN SODIUM CHLORIDE 200-0.9 MG/100ML-% IV SOLN
150.0000 mg | Freq: Once | INTRAVENOUS | Status: AC
  Administered 2014-09-01: 150 mg via INTRAVENOUS
  Filled 2014-09-01: qty 75

## 2014-09-01 MED ORDER — BOOST / RESOURCE BREEZE PO LIQD
1.0000 | Freq: Three times a day (TID) | ORAL | Status: DC
  Administered 2014-09-01 – 2014-09-09 (×18): 1 via ORAL

## 2014-09-01 MED ORDER — FLUCONAZOLE IN SODIUM CHLORIDE 200-0.9 MG/100ML-% IV SOLN
150.0000 mg | INTRAVENOUS | Status: DC
  Filled 2014-09-01: qty 75

## 2014-09-01 MED ORDER — POLYETHYLENE GLYCOL 3350 17 G PO PACK
17.0000 g | PACK | Freq: Every day | ORAL | Status: DC
  Administered 2014-09-01 – 2014-09-02 (×2): 17 g via ORAL
  Filled 2014-09-01 (×3): qty 1

## 2014-09-01 NOTE — Progress Notes (Addendum)
Patient ID: Tanya Farmer, female   DOB: 08/14/1926, 81 y.o.   MRN: [id number] [facility] Progress Note  Tanya Farmer 81 y.o. [birth year]   Subjective: Small bloody BM this morning. Hgb 12 (10.9). Family at bedside. Patient lying in bed with eyes closed and will open eyes to command and appropriately respond verbally at times.  Objective: Vital signs: Filed Vitals:   09/01/14 0523  BP: 138/96  Pulse: 89  Temp: 97.7 F (36.5 Farmer)  Resp: 15    Physical Exam: Gen: demented, lethargic, elderly, frail, no acute distress  Abd: diffusely tender without guarding, soft, nondistended, +BS  Lab Results:  Recent Labs  08/31/14 1039 08/31/14 1616 09/01/14 0555  NA 144  --  142  K 3.5  --  4.0  CL 110  --  109  CO2  --   --  27  GLUCOSE 89  --  90  BUN 17  --  11  CREATININE 0.90  --  0.81  CALCIUM  --   --  9.2  MG  --  1.9  --     Recent Labs  09/01/14 0555  AST 32  ALT 6  ALKPHOS 51  BILITOT 0.6  PROT 7.1  ALBUMIN 3.5    Recent Labs  08/31/14 1000  08/31/14 1616 09/01/14 0619  WBC 5.8  --  6.4 6.1  NEUTROABS 4.1  --   --   --   HGB 11.2*  < > 10.9* 12.0  HCT 34.6*  < > 34.1* 36.4  MCV 103.9*  --  103.0* 102.0*  PLT 1034*  --  1044* 912*  < > = values in this interval not displayed.    Assessment/Plan: Rectal bleeding suspect rectal outlet source. Hgb has improved today to 12.0 so amount of bleeding likely minimal. Would NOT recommend any invasive GI endoscopic procedures. Continue supportive care. Defer daughter's questions regarding her UTI and disposition to the hospitalist and nurse has informed that doctor.   Tanya Farmer. 09/01/2014, 11:26 AM

## 2014-09-01 NOTE — Progress Notes (Signed)
INITIAL NUTRITION ASSESSMENT  DOCUMENTATION CODES Per approved criteria  -Not Applicable   INTERVENTION: -Resource Breeze po TID, each supplement provides 250 kcal and 9 grams of protein -Continue 30 ml Prostat daily, for continuity of care -RD to follow for diet advancement  NUTRITION DIAGNOSIS: Inadequate oral intake related to altered GI function as evidenced by 25-35%, on clear liquid diet.   Goal: Pt will meet >90% of estimated nutritional needs  Monitor:  Diet advancement, PO/supplement intake, labs, weight changes, I/O's  Reason for Assessment: MST=2  81 y.o. female  Admitting Dx: Acute lower GI bleeding  LONNA RABOLD is a 81 y.o. female with PMH significant for Hypothyroidism, A fib, history of DVT on coumadin who was transfer from SNF due to blood in the stool. Per report patient had a bloody stool night prior to admission and the morning of admission. Rectal exam perform by ED physician showed dark maroon stool, occult blood positive.  Patient is on coumadin, INR at 2.4.   ASSESSMENT: Pt admitted for lower GI bleed. Pt off floor at time of visit, so nutrition focused physical exam deferred at this time.  Per GI, supportive measures are being pursued. Pt [place] not a candidate for invasive GI procedures.  Pt is currently on a clear liquid diet. Intake is poor; PO: 25-35%.  Wt hx reveals wt stability over the past year. Noted UBE between 127-134#.  SNF MAR reveals that pt receives 30 ml Prostat daily (which provides 100 kcals and 15 grams of protein). Supplement has also been ordered on admission. Due to decreased PO intake will also add clear liquid supplement to promote nutritional adequacy.  Labs reviewed. WDL.  Height: Ht Readings from Last 1 Encounters:  08/31/14 5' (1.524 m)    Weight: Wt Readings from Last 1 Encounters:  09/01/14 131 lb 13.4 oz (59.8 kg)    Ideal Body Weight: 100#  % Ideal Body Weight: 131%  Wt Readings from Last 50 Encounters:  09/01/14  131 lb 13.4 oz (59.8 kg)  08/25/14 130 lb 3.2 oz (59.058 kg)  08/18/14 130 lb 3.2 oz (59.058 kg)  08/11/14 130 lb 3.2 oz (59.058 kg)  07/28/14 128 lb (58.06 kg)  07/21/14 129 lb (58.514 kg)  07/18/14 128 lb (58.06 kg)  07/14/14 132 lb 12.8 oz (60.238 kg)  07/07/14 132 lb 12.8 oz (60.238 kg)  06/30/14 134 lb (60.782 kg)  06/30/14 134 lb (60.782 kg)  06/25/14 129 lb 6.4 oz (58.695 kg)  06/23/14 129 lb (58.514 kg)  06/16/14 129 lb (58.514 kg)  06/05/14 130 lb 12.8 oz (59.33 kg)  06/02/14 130 lb (58.968 kg)  05/26/14 130 lb (58.968 kg)  05/15/14 126 lb (57.153 kg)  05/13/14 124 lb 1.9 oz (56.3 kg)  05/08/14 130 lb (58.968 kg)  05/06/14 130 lb (58.968 kg)  04/28/14 130 lb (58.968 kg)  04/21/14 130 lb (58.968 kg)  04/10/14 132 lb (59.875 kg)  04/10/14 132 lb (59.875 kg)  03/31/14 130 lb (58.968 kg)  03/24/14 132 lb (59.875 kg)  03/17/14 132 lb (59.875 kg)  03/10/14 132 lb (59.875 kg)  03/05/14 132 lb 12.8 oz (60.238 kg)  02/17/14 134 lb 6.4 oz (60.963 kg)  01/27/14 133 lb (60.328 kg)  01/24/14 133 lb (60.328 kg)  01/21/14 133 lb (60.328 kg)  01/17/14 133 lb (60.328 kg)  01/15/14 133 lb (60.328 kg)  01/06/14 133 lb (60.328 kg)  12/18/13 137 lb 12.8 oz (62.506 kg)  11/18/13 150 lb 4.8 oz (68.176 kg)  11/11/13  133 lb 6.4 oz (60.51 kg)  10/28/13 128 lb 3.2 oz (58.151 kg)  10/23/13 128 lb 3.2 oz (58.151 kg)  10/21/13 128 lb 3.2 oz (58.151 kg)  10/14/13 128 lb (58.06 kg)  10/09/13 128 lb (58.06 kg)  10/04/13 127 lb (57.607 kg)  10/02/13 127 lb (57.607 kg)  09/25/13 127 lb (57.607 kg)  09/23/13 127 lb (57.607 kg)  09/16/13 130 lb (58.968 kg)   Usual Body Weight: 130#  % Usual Body Weight: 101%  BMI:  Body mass index is 25.75 kg/(m^2). Overweight  Estimated Nutritional Needs: Kcal: 1500-1700 Protein: 65-75 grams Fluid: 1.5-1.7 L  Skin: Intact  Diet Order: Diet clear liquid  EDUCATION NEEDS: -Education not appropriate at this time   Intake/Output Summary (Last  24 hours) at 09/01/14 1435 Last data filed at 09/01/14 1424  Gross per 24 hour  Intake 709.25 ml  Output      0 ml  Net 709.25 ml    Last BM: 09/01/14  Labs:   Recent Labs Lab 08/31/14 1039 08/31/14 1616 09/01/14 0555  NA 144  --  142  K 3.5  --  4.0  CL 110  --  109  CO2  --   --  27  BUN 17  --  11  CREATININE 0.90  --  0.81  CALCIUM  --   --  9.2  MG  --  1.9  --   GLUCOSE 89  --  90    CBG (last 3)  No results for input(s): GLUCAP in the last 72 hours.  Scheduled Meds: . atorvastatin  10 mg Oral q1800  . carbidopa-levodopa  1 tablet Oral BID  . cefTRIAXone (ROCEPHIN)  IV  1 g Intravenous Q24H  . feeding supplement (PRO-STAT SUGAR FREE 64)  30 mL Oral Daily  . hydroxyurea  1,000 mg Oral Daily  . levothyroxine  150 mcg Oral QAC breakfast  . pantoprazole (PROTONIX) IV  40 mg Intravenous Q12H  . polyethylene glycol  17 g Oral Daily  . sodium chloride  3 mL Intravenous Q12H    Continuous Infusions: . dextrose 5 % and 0.9% NaCl 75 mL/hr (08/31/14 1815)    Past Medical History  Diagnosis Date  . Hypertension   . Hyperlipidemia   . Thyroid disease     Hypothyroidism  . GERD (gastroesophageal reflux disease)   . Arthritis     Degenerative  . Peripheral vascular disease     Left   Fem-Pop and  tibial Thrombectomy  . High platelet count   . Dementia   . Pain in toe of right foot   . Hypothyroidism   . Long term (current) use of anticoagulants 03/13/2014  . Neuromuscular disorder   . Parkinson's disease     Past Surgical History  Procedure Laterality Date  . Femoral-popliteal bypass graft  2002    Left  Dr. Sherren Mocha Early  . Abdominal hysterectomy    . Cataract surgery      left eye    Sloane Junkin A. Jimmye Norman, RD, LDN, CDE Pager: [phone] After hours Pager: [phone]

## 2014-09-01 NOTE — Progress Notes (Signed)
PROGRESS NOTE  GUIDA ASMAN Farmer:[id number] DOB: 03-22-1926 DOA: 08/31/2014 PCP: Blanchie Serve, MD  HPI Tanya Farmer is a 81 y.o. female with PMH significant for Hypothyroidism, A fib, history of DVT on coumadin who was transfer from SNF due to blood in the stool. Per report patient had a bloody stool night prior to admission and the morning of admission. Rectal exam perform by ED physician showed dark maroon stool, occult blood positive.  Patient is on coumadin, INR at 2.4.   Subjective: Patient severely demented. Unable to give any information  Assessment/Plan:  Lower GI Bleed with BRBPR - Last bloody bowel movement 1/4 am.  Small amount of stool with BRB.  Hgb stable at baseline at 12.0 - Does not appear to have any pain. - Appreciate [facility] consultation.  Supportive treatment for now. - Miralax daily. Patient tolerating clears.   - Coumadin being held while she is actively bleeding  UTI - On ceftriaxone. Cultures pending.  Thrombocytosis/Polycythemia - Chronic. She follows regularly with Dr. Noel Journey at [facility].   - Continue hydroxyurea. Coumadin being held due to active bleed.  Dementia with AMS.   - Altered Mental Status likely due to UTI. - Uncertain of baseline mental status.  Ms. Boss is current calm and very pleasant. - MR Brain negative for acute changes or stroke - Namenda and Aricept are currently being held due to prolonged QTC.  Will need to discuss with DTR risks of restarting these.  Parkinsons - Continue Carbidopa-levodopa  HTN - PRN hydralazine for SBP more than 160.   A fib - on coumadin. Hold anticoagulation in setting of bleed.   Prolog QT - hold Aricept, nemanda. Magnesium level 1.9 (WNL)  Hypothyroidism - continue with synthroid.   Vaginal itching - per Daughter this problem for months. Has use antifungal. Need outpatient GYN.   Pyuria; positive nitrates. Start ceftriaxone. Follow urine culture.  DVT Prophylaxis:   SCDs.  Code Status: Full code. Family Communication: discussed with daughter bedside Disposition Plan: return to SNF when appropriate.   Consultants:  [facility]   Procedures:  none  Antibiotics: Anti-infectives    Start     Dose/Rate Route Frequency Ordered Stop   08/31/14 1545  cefTRIAXone (ROCEPHIN) 1 g in dextrose 5 % 50 mL IVPB     1 g100 mL/hr over 30 Minutes Intravenous Every 24 hours 08/31/14 1532       Objective: Filed Vitals:   08/31/14 1545 08/31/14 1655 08/31/14 2207 09/01/14 0523  BP: 144/100 162/61 160/89 138/96  Pulse: 84 89    Temp:  99.1 F (37.3 C) 98.2 F (36.8 C) 97.7 F (36.5 C)  TempSrc:  Oral Oral Oral  Resp: 17 16 17 15   Height:  5' (1.524 m)    Weight:  59.3 kg (130 lb 11.7 oz)  59.8 kg (131 lb 13.4 oz)  SpO2: 100% 95% 97% 100%    Intake/Output Summary (Last 24 hours) at 09/01/14 1216 Last data filed at 09/01/14 1023  Gross per 24 hour  Intake 589.25 ml  Output      0 ml  Net 589.25 ml   Filed Weights   08/31/14 1655 09/01/14 0523  Weight: 59.3 kg (130 lb 11.7 oz) 59.8 kg (131 lb 13.4 oz)   Exam: General: Well developed, well nourished, NAD, appears stated age.  Pleasantly demented. HEENT:  PERR, EOMI, Anicteic Sclera, MMM. No pharyngeal erythema or exudates  Neck: Supple, no JVD, no masses  Cardiovascular::  Irreg irreg, S1 S2 auscultated, no rubs, murmurs or gallops.   Respiratory: Clear to auscultation bilaterally with equal chest rise  Abdomen: Soft, nontender, nondistended, + bowel sounds  Extremities: warm dry without cyanosis clubbing or edema.  Skin: Without rashes exudates or nodules.   Note - Patient with small black stool and BRBPR on Bed sheets.  Data Reviewed: Basic Metabolic Panel:  Recent Labs Lab 08/31/14 1039 08/31/14 1616 09/01/14 0555  NA 144  --  142  K 3.5  --  4.0  CL 110  --  109  CO2  --   --  27  GLUCOSE 89  --  90  BUN 17  --  11  CREATININE 0.90  --  0.81  CALCIUM  --   --  9.2  MG  --  1.9   --    Liver Function Tests:  Recent Labs Lab 09/01/14 0555  AST 32  ALT 6  ALKPHOS 51  BILITOT 0.6  PROT 7.1  ALBUMIN 3.5   CBC:  Recent Labs Lab 08/31/14 0030 08/31/14 1000 08/31/14 1039 08/31/14 1616 09/01/14 0619  WBC 5.9 5.8  --  6.4 6.1  NEUTROABS  --  4.1  --   --   --   HGB 11.1* 11.2* 12.9 10.9* 12.0  HCT 35.5* 34.6* 38.0 34.1* 36.4  MCV 103.5* 103.9*  --  103.0* 102.0*  PLT 1047* 1034*  --  1044* 912*   BNP (last 3 results)  Recent Labs  09/16/13 1730  PROBNP 619.3*   Studies: Mr Brain Wo Contrast  09/01/2014   CLINICAL DATA:  Increased confusion.  Initial evaluation.  EXAM: MRI HEAD WITHOUT CONTRAST  TECHNIQUE: Multiplanar, multiecho pulse sequences of the brain and surrounding structures were obtained without intravenous contrast.  COMPARISON:  Prior CT from 05/09/2014.  FINDINGS: Diffuse prominence of the CSF containing spaces is compatible with generalized cerebral atrophy. Extensive confluent T2/FLAIR hyperintensity involving the periventricular and deep white matter is seen, most consistent with advanced chronic microvascular ischemic disease. Small remote lacunar infarct present within the right basal ganglia and left thalamus.  No abnormal foci of restricted diffusion to suggest acute intracranial infarct. Gray-white matter differentiation maintained. No acute intracranial hemorrhage. Multiple scattered foci of susceptibility artifact present within the bilateral cerebral hemispheres, most consistent with chronic small micro hemorrhages. These may be related chronic underlying hypertension.  No mass lesion or midline shift. No mass effect. Ventricular prominence related global parenchymal volume loss present without hydrocephalus. No extra-axial fluid collection.  Craniocervical junction within normal limits. Degenerative changes noted within the upper cervical spine.  Pituitary gland grossly stable. No acute abnormality seen about the orbits. Patient is status  post lens replacement on the left.  Minimal fluid opacity noted within the inferior right maxillary sinus. Paranasal sinuses are otherwise clear. No mastoid effusion.  IMPRESSION: 1. No acute intracranial infarct or other abnormality identified. 2. Advanced cerebral atrophy with chronic microvascular ischemic disease, grossly stable from prior.   Electronically Signed   By: Jeannine Boga M.D.   On: 09/01/2014 01:44   Scheduled Meds: . atorvastatin  10 mg Oral q1800  . carbidopa-levodopa  1 tablet Oral BID  . cefTRIAXone (ROCEPHIN)  IV  1 g Intravenous Q24H  . feeding supplement (PRO-STAT SUGAR FREE 64)  30 mL Oral Daily  . hydroxyurea  1,000 mg Oral Daily  . levothyroxine  150 mcg Oral QAC breakfast  . pantoprazole (PROTONIX) IV  40 mg Intravenous Q12H  . polyethylene glycol  17 g Oral Daily  . sodium chloride  3 mL Intravenous Q12H   Continuous Infusions: . dextrose 5 % and 0.9% NaCl 75 mL/hr (08/31/14 1815)   Principal Problem:   Acute lower GI bleeding Active Problems:   Hypothyroidism   Essential hypertension, benign   Chronic anticoagulation   Embolism and thrombosis of arteries of lower extremity   Dementia with Parkinsonism   GI bleed  Karen Kitchens  [facility] Pager [phone]. If 7PM-7AM, please contact night-coverage at [email or web address], password [facility] 09/01/2014, 12:16 PM  LOS: 1 day   Patient was seen, examined,treatment plan was discussed with the Advance Practice Provider.  I have directly reviewed the clinical findings, lab, imaging studies and management of this patient in detail. I have made the necessary changes to the above noted documentation, and agree with the documentation, as recorded by the Advance Practice Provider.   Time spent: 50 minutes, more than 50% bedside involved in family discussions.  Marzetta Board, MD [facility] [phone]

## 2014-09-01 NOTE — Progress Notes (Signed)
Pts daughter is requesting that the pt has ted hose when she is discharged to the facility.

## 2014-09-02 DIAGNOSIS — N390 Urinary tract infection, site not specified: Secondary | ICD-10-CM

## 2014-09-02 LAB — URINE CULTURE

## 2014-09-02 LAB — MRSA PCR SCREENING: MRSA BY PCR: NEGATIVE

## 2014-09-02 LAB — PROTIME-INR
INR: 1.84 — ABNORMAL HIGH (ref 0.00–1.49)
Prothrombin Time: 21.4 seconds — ABNORMAL HIGH (ref 11.6–15.2)

## 2014-09-02 MED ORDER — PANTOPRAZOLE SODIUM 40 MG PO TBEC
40.0000 mg | DELAYED_RELEASE_TABLET | Freq: Every day | ORAL | Status: DC
  Administered 2014-09-03 – 2014-09-09 (×7): 40 mg via ORAL
  Filled 2014-09-02 (×4): qty 1

## 2014-09-02 MED ORDER — POLYETHYLENE GLYCOL 3350 17 G PO PACK
17.0000 g | PACK | Freq: Every day | ORAL | Status: DC
  Administered 2014-09-02 – 2014-09-04 (×3): 17 g via ORAL
  Filled 2014-09-02 (×4): qty 1

## 2014-09-02 MED ORDER — LOSARTAN POTASSIUM 50 MG PO TABS
75.0000 mg | ORAL_TABLET | Freq: Every day | ORAL | Status: DC
  Administered 2014-09-02 – 2014-09-09 (×8): 75 mg via ORAL
  Filled 2014-09-02 (×8): qty 1

## 2014-09-02 MED ORDER — CRANBERRY 250 MG PO CAPS
250.0000 mg | ORAL_CAPSULE | Freq: Every day | ORAL | Status: DC
  Filled 2014-09-02: qty 1

## 2014-09-02 NOTE — Care Management Note (Unsigned)
    Page 1 of 1   09/08/2014     7:13:13 PM CARE MANAGEMENT NOTE 09/08/2014  Patient:  Tanya Farmer, Tanya Farmer   Account Number:  [id number]  Date Initiated:  09/02/2014  Documentation initiated by:  Tomi Bamberger  Subjective/Objective Assessment:   dx gib  admit- from [facility]- snf     Action/Plan:   Anticipated DC Date:  09/09/2014   Anticipated DC Plan:  [place]  In-house referral  Clinical Social Worker      DC Planning Services  CM consult      Choice offered to / List presented to:             Status of service:  In process, will continue to follow Medicare Important Message given?  YES (If response is "NO", the following Medicare IM given date fields will be blank) Date Medicare IM given:  09/02/2014 Medicare IM given by:  Tomi Bamberger Date Additional Medicare IM given:  09/08/2014 Additional Medicare IM given by:  [facility]  Discharge Disposition:    Per UR Regulation:  Reviewed for med. necessity/level of care/duration of stay  If discussed at [place] of Stay Meetings, dates discussed:    Comments:  09/02/14 [place], BSN [phone] patient from [place], pateint will return to snf at dc.  CSW following.

## 2014-09-02 NOTE — Progress Notes (Signed)
CRITICAL VALUE ALERT  Critical value received:  plt [id number]  Date of notification:  09/02/14  Time of notification:  .0930  Critical value read back:Yes.    Nurse who received alert:  Barrie Lyme  MD notified (1st page):  Reinaldo Berber  Time of first page:  0930  MD notified (2nd page):  Time of second page:  Responding MD:  Reinaldo Berber  Time MD responded:  [id number]

## 2014-09-02 NOTE — Progress Notes (Signed)
Tanya Farmer informed of patient's tachycardia and PVCs. Patient is not sustaining per telemetry. [name] stated aware. No new orders at this time

## 2014-09-02 NOTE — Progress Notes (Addendum)
MD Gherghe notified of patient's elevated BP after PRN BP med given. Maryanne PA notified of patients elevated BP, awaiting orders

## 2014-09-02 NOTE — Progress Notes (Signed)
PROGRESS NOTE  Tanya Farmer:[id number] DOB: [birth year] DOA: 08/31/2014 PCP: Blanchie Serve, MD  HPI Tanya Farmer is a 81 y.o. female with PMH significant for Hypothyroidism, A fib, history of DVT on coumadin who was transfer from SNF due to blood in the stool. Per report patient had a bloody stool night prior to admission and the morning of admission. Rectal exam perform by ED physician showed dark maroon stool, occult blood positive.  Patient is on coumadin, INR at 2.4.   Subjective: No complaints, denies pain.  Pleasantly demented.  Assessment/Plan:  Lower GI Bleed with BRBPR - Last bloody bowel movement overnight last evening. Hgb decreased from of 12.0 to 10.3 - Appreciate [facility] consultation.  Supportive treatment for now. - Miralax daily. Patient tolerating clears.   - Coumadin being held while she is actively bleeding  UTI - On ceftriaxone. Cultures show grm negative rods. Sensitivity pending.  Patient takes cranberry caps as well.   - We have a call into Dr. Lyndal Rainbow office for recommendations for recurrent UTI.  Thrombocytosis/Polycythemia - Chronic. She follows regularly with Dr. Noel Journey at [facility]. We have left him a voice mail regarding her admission. - Continue hydroxyurea. Coumadin/Aspirin being held due to active bleed.   Dementia with AMS.   - Altered Mental Status likely due to UTI.  Mental status is improved on 08/30/4578. - Uncertain of baseline mental status.  Tanya Farmer is current calm and very pleasant. - MR Brain negative for acute changes or stroke - Namenda and Aricept are currently being held due to prolonged QTC.  Will need to discuss with DTR risks of restarting these.  Parkinsons - Continue Carbidopa-levodopa  HTN - Restarted Losartan that was held on admission due to GI bleed.  PRN hydralazine for SBP more than 160.   A fib - on coumadin. Hold anticoagulation in setting of bleed.   Prolonged QT - hold Aricept, nemanda.  Magnesium level 1.9 (WNL)  Hypothyroidism - continue with synthroid.   Vaginal itching - per Daughter this problem for months. Has use antifungal. Need outpatient GYN.   Pyuria; positive nitrates. Start ceftriaxone. Follow urine culture.  DVT Prophylaxis:  SCDs.  Code Status: Full code. Family Communication: discussed with daughter bedside Disposition Plan: return to SNF when appropriate.  Consultants:  [facility]   Procedures:  none  Antibiotics: Anti-infectives    Start     Dose/Rate Route Frequency Ordered Stop   09/01/14 1600  fluconazole (DIFLUCAN) IVPB 150 mg     150 mg75 mL/hr over 60 Minutes Intravenous  Once 09/01/14 1518 09/01/14 2036   09/01/14 1515  fluconazole (DIFLUCAN) IVPB 150 mg  Status:  Discontinued     150 mg75 mL/hr over 60 Minutes Intravenous Every 24 hours 09/01/14 1510 09/01/14 1518   08/31/14 1545  cefTRIAXone (ROCEPHIN) 1 g in dextrose 5 % 50 mL IVPB     1 g100 mL/hr over 30 Minutes Intravenous Every 24 hours 08/31/14 1532       Objective: Filed Vitals:   09/01/14 2217 09/02/14 0538 09/02/14 0541 09/02/14 0706  BP: 150/95  167/77 167/74  Pulse:      Temp: 98 F (36.7 C)  97.9 F (36.6 C)   TempSrc: Oral  Axillary   Resp: 16  17   Height:      Weight:  58 kg (127 lb 13.9 oz)    SpO2: 100%  100%     Intake/Output Summary (Last 24 hours) at 09/02/14 1146  Last data filed at 09/02/14 1037  Gross per 24 hour  Intake    590 ml  Output      0 ml  Net    590 ml   Filed Weights   08/31/14 1655 09/01/14 0523 09/02/14 0538  Weight: 59.3 kg (130 lb 11.7 oz) 59.8 kg (131 lb 13.4 oz) 58 kg (127 lb 13.9 oz)   Exam: General: Well developed, well nourished, NAD, appears stated age.  Pleasantly demented. HEENT:  Anicteic Sclera, MMM. No pharyngeal erythema or exudates  Neck: Supple, no JVD, no masses  Cardiovascular::  Irreg irreg, S1 S2 auscultated, no rubs, murmurs or gallops.   Respiratory: Clear to auscultation bilaterally with equal chest  rise  Abdomen: Soft, nontender, nondistended, + bowel sounds  Extremities: warm dry without cyanosis clubbing or edema.  Skin: Without rashes exudates or nodules.    Data Reviewed: Basic Metabolic Panel:  Recent Labs Lab 08/31/14 1039 08/31/14 1616 09/01/14 0555  NA 144  --  142  K 3.5  --  4.0  CL 110  --  109  CO2  --   --  27  GLUCOSE 89  --  90  BUN 17  --  11  CREATININE 0.90  --  0.81  CALCIUM  --   --  9.2  MG  --  1.9  --    Liver Function Tests:  Recent Labs Lab 09/01/14 0555  AST 32  ALT 6  ALKPHOS 51  BILITOT 0.6  PROT 7.1  ALBUMIN 3.5   CBC:  Recent Labs Lab 08/31/14 1000  08/31/14 1616 09/01/14 0619 09/01/14 1614 09/01/14 2329 09/02/14 0421  WBC 5.8  --  6.4 6.1 5.5 4.7 4.3  NEUTROABS 4.1  --   --   --   --   --   --   HGB 11.2*  < > 10.9* 12.0 10.8* 10.3* 10.3*  HCT 34.6*  < > 34.1* 36.4 34.2* 31.9* 33.0*  MCV 103.9*  --  103.0* 102.0* 102.1* 103.9* 104.4*  PLT 1034*  --  1044* 912* 970* 933* 956*  < > = values in this interval not displayed. BNP (last 3 results)  Recent Labs  09/16/13 1730  PROBNP 619.3*   Studies: Mr Brain Wo Contrast  09/01/2014   CLINICAL DATA:  Increased confusion.  Initial evaluation.  EXAM: MRI HEAD WITHOUT CONTRAST  TECHNIQUE: Multiplanar, multiecho pulse sequences of the brain and surrounding structures were obtained without intravenous contrast.  COMPARISON:  Prior CT from 05/09/2014.  FINDINGS: Diffuse prominence of the CSF containing spaces is compatible with generalized cerebral atrophy. Extensive confluent T2/FLAIR hyperintensity involving the periventricular and deep white matter is seen, most consistent with advanced chronic microvascular ischemic disease. Small remote lacunar infarct present within the right basal ganglia and left thalamus.  No abnormal foci of restricted diffusion to suggest acute intracranial infarct. Gray-white matter differentiation maintained. No acute intracranial hemorrhage. Multiple  scattered foci of susceptibility artifact present within the bilateral cerebral hemispheres, most consistent with chronic small micro hemorrhages. These may be related chronic underlying hypertension.  No mass lesion or midline shift. No mass effect. Ventricular prominence related global parenchymal volume loss present without hydrocephalus. No extra-axial fluid collection.  Craniocervical junction within normal limits. Degenerative changes noted within the upper cervical spine.  Pituitary gland grossly stable. No acute abnormality seen about the orbits. Patient is status post lens replacement on the left.  Minimal fluid opacity noted within the inferior right maxillary sinus. Paranasal sinuses are  otherwise clear. No mastoid effusion.  IMPRESSION: 1. No acute intracranial infarct or other abnormality identified. 2. Advanced cerebral atrophy with chronic microvascular ischemic disease, grossly stable from prior.   Electronically Signed   By: Jeannine Boga M.D.   On: 09/01/2014 01:44   Scheduled Meds: . atorvastatin  10 mg Oral q1800  . carbidopa-levodopa  1 tablet Oral BID  . cefTRIAXone (ROCEPHIN)  IV  1 g Intravenous Q24H  . Cranberry  250 mg Oral Daily  . feeding supplement (PRO-STAT SUGAR FREE 64)  30 mL Oral Daily  . feeding supplement (RESOURCE BREEZE)  1 Container Oral TID BM  . hydroxyurea  1,000 mg Oral Daily  . levothyroxine  150 mcg Oral QAC breakfast  . losartan  75 mg Oral Daily  . pantoprazole  40 mg Oral Daily  . polyethylene glycol  17 g Oral Daily  . polyethylene glycol  17 g Oral Daily  . sodium chloride  3 mL Intravenous Q12H   Continuous Infusions: . dextrose 5 % and 0.9% NaCl 75 mL/hr at 09/02/14 0300   Principal Problem:   Acute lower GI bleeding Active Problems:   UTI (lower urinary tract infection)   Hypothyroidism   Essential hypertension, benign   Chronic anticoagulation   Embolism and thrombosis of arteries of lower extremity   Dementia with  Parkinsonism   GI bleed  Karen Kitchens  [facility] Pager [phone]. If 7PM-7AM, please contact night-coverage at [email or web address], password [facility] 09/02/2014, 11:46 AM  LOS: 2 days   Patient was seen, examined,treatment plan was discussed with the Advance Practice Provider.  I have directly reviewed the clinical findings, lab, imaging studies and management of this patient in detail. I have made the necessary changes to the above noted documentation, and agree with the documentation, as recorded by the Advance Practice Provider.   Marzetta Board, MD [facility] [phone]

## 2014-09-02 NOTE — Clinical Social Work Placement (Signed)
Clinical Social Work Department CLINICAL SOCIAL WORK PLACEMENT NOTE 09/02/2014  Patient:  Tanya Farmer, Tanya Farmer  Account Number:  [id number] Admit date:  08/31/2014  Clinical Social Worker:  Lovey Newcomer  Date/time:  09/02/2014 02:45 PM  Clinical Social Work is seeking post-discharge placement for this patient at the following level of care:   [place]   (*CSW will update this form in Epic as items are completed)   09/02/2014  Patient/family provided with [facility] of Clinical Social Work's list of facilities offering this level of care within the geographic area requested by the patient (or if unable, by the patient's family).  09/02/2014  Patient/family informed of their freedom to choose among providers that offer the needed level of care, that participate in Medicare, Medicaid or managed care program needed by the patient, have an available bed and are willing to accept the patient.  09/02/2014  Patient/family informed of [facility]' ownership interest in [facility], as well as of the fact that they are under no obligation to receive care at this facility.  PASARR submitted to EDS on  PASARR number received on   FL2 transmitted to all facilities in geographic area requested by pt/family on  09/02/2014 FL2 transmitted to all facilities within larger geographic area on   Patient informed that his/her managed care company has contracts with or will negotiate with  certain facilities, including the following:     Patient/family informed of bed offers received:   Patient chooses bed at  Physician recommends and patient chooses bed at    Patient to be transferred to  on   Patient to be transferred to facility by  Patient and family notified of transfer on  Name of family member notified:    The following physician request were entered in Epic:   Additional Comments:    Liz Beach MSW, [place], [place], [phone]

## 2014-09-02 NOTE — Progress Notes (Addendum)
[facility] Progress Note  Subjective: Patient sleeping difficult to arouse, no one else in room, no stools charted at all Objective: Vital signs in last 24 hours: Temp:  [97.8 F (36.6 C)-98 F (36.7 C)] 97.9 F (36.6 C) (01/05 0541) Pulse Rate:  [66] 66 (01/04 1422) Resp:  [16-17] 17 (01/05 0541) BP: (139-167)/(74-95) 167/74 mmHg (01/05 0706) SpO2:  [100 %] 100 % (01/05 0541) Weight:  [58 kg (127 lb 13.9 oz)] 58 kg (127 lb 13.9 oz) (01/05 0538) Weight change: -1.3 kg (-2 lb 13.9 oz)   PE: Unchanged  Lab Results: Results for orders placed or performed during the hospital encounter of 08/31/14 (from the past 24 hour(s))  CBC     Status: Abnormal   Collection Time: 09/01/14  4:14 PM  Result Value Ref Range   WBC 5.5 4.0 - 10.5 K/uL   RBC 3.35 (L) 3.87 - 5.11 MIL/uL   Hemoglobin 10.8 (L) 12.0 - 15.0 g/dL   HCT 34.2 (L) 36.0 - 46.0 %   MCV 102.1 (H) 78.0 - 100.0 fL   MCH 32.2 26.0 - 34.0 pg   MCHC 31.6 30.0 - 36.0 g/dL   RDW 17.6 (H) 11.5 - 15.5 %   Platelets 970 (HH) 150 - 400 K/uL  CBC     Status: Abnormal   Collection Time: 09/01/14 11:29 PM  Result Value Ref Range   WBC 4.7 4.0 - 10.5 K/uL   RBC 3.07 (L) 3.87 - 5.11 MIL/uL   Hemoglobin 10.3 (L) 12.0 - 15.0 g/dL   HCT 31.9 (L) 36.0 - 46.0 %   MCV 103.9 (H) 78.0 - 100.0 fL   MCH 33.6 26.0 - 34.0 pg   MCHC 32.3 30.0 - 36.0 g/dL   RDW 17.9 (H) 11.5 - 15.5 %   Platelets 933 (HH) 150 - 400 K/uL  Protime-INR     Status: Abnormal   Collection Time: 09/02/14  4:21 AM  Result Value Ref Range   Prothrombin Time 21.4 (H) 11.6 - 15.2 seconds   INR 1.84 (H) 0.00 - 1.49  CBC     Status: Abnormal   Collection Time: 09/02/14  4:21 AM  Result Value Ref Range   WBC 4.3 4.0 - 10.5 K/uL   RBC 3.16 (L) 3.87 - 5.11 MIL/uL   Hemoglobin 10.3 (L) 12.0 - 15.0 g/dL   HCT 33.0 (L) 36.0 - 46.0 %   MCV 104.4 (H) 78.0 - 100.0 fL   MCH 32.6 26.0 - 34.0 pg   MCHC 31.2 30.0 - 36.0 g/dL   RDW 18.1 (H) 11.5 - 15.5 %   Platelets  956 (HH) 150 - 400 K/uL  MRSA PCR Screening     Status: None   Collection Time: 09/02/14  8:18 AM  Result Value Ref Range   MRSA by PCR NEGATIVE NEGATIVE    Studies/Results: Mr Brain Wo Contrast  09/01/2014   CLINICAL DATA:  Increased confusion.  Initial evaluation.  EXAM: MRI HEAD WITHOUT CONTRAST  TECHNIQUE: Multiplanar, multiecho pulse sequences of the brain and surrounding structures were obtained without intravenous contrast.  COMPARISON:  Prior CT from 05/09/2014.  FINDINGS: Diffuse prominence of the CSF containing spaces is compatible with generalized cerebral atrophy. Extensive confluent T2/FLAIR hyperintensity involving the periventricular and deep white matter is seen, most consistent with advanced chronic microvascular ischemic disease. Small remote lacunar infarct present within the right basal ganglia and left thalamus.  No abnormal foci of restricted diffusion to suggest acute intracranial infarct. Gray-white matter differentiation maintained. No  acute intracranial hemorrhage. Multiple scattered foci of susceptibility artifact present within the bilateral cerebral hemispheres, most consistent with chronic small micro hemorrhages. These may be related chronic underlying hypertension.  No mass lesion or midline shift. No mass effect. Ventricular prominence related global parenchymal volume loss present without hydrocephalus. No extra-axial fluid collection.  Craniocervical junction within normal limits. Degenerative changes noted within the upper cervical spine.  Pituitary gland grossly stable. No acute abnormality seen about the orbits. Patient is status post lens replacement on the left.  Minimal fluid opacity noted within the inferior right maxillary sinus. Paranasal sinuses are otherwise clear. No mastoid effusion.  IMPRESSION: 1. No acute intracranial infarct or other abnormality identified. 2. Advanced cerebral atrophy with chronic microvascular ischemic disease, grossly stable from prior.    Electronically Signed   By: Jeannine Boga M.D.   On: 09/01/2014 01:44      Assessment: Reported rectal bleeding with multiple comorbidities no bleeding seen during hospitalization. HB stable.  Plan: I do not think there would be significant benefit to her to undergo endoscopic procedures will sign off for now. If she demonstrates significant bleeding and family wants more aggressive approach please call us back.    IOXBD,ZHGD C 09/02/2014, 11:33 AM

## 2014-09-02 NOTE — Discharge Instructions (Signed)
Instructions for SNF  1. Please ensure good hygiene for the patient 2. Please ensure adequate hydration and nutrition as tolerated, monitor with daily weights and monitor for signs of dehydration. 3. Please establish a schedule for urination to prevent incontinence and increased risk for infections. 4. Please mobilize patient as tolerated

## 2014-09-02 NOTE — Clinical Social Work Psychosocial (Signed)
Clinical Social Work Department BRIEF PSYCHOSOCIAL ASSESSMENT 09/02/2014  Patient:  Tanya Farmer, Tanya Farmer     Account Number:  [id number]     Admit date:  08/31/2014  Clinical Social Worker:  Lovey Newcomer  Date/Time:  09/02/2014 03:54 PM  Referred by:  Physician  Date Referred:  09/02/2014 Referred for  SNF Placement   Other Referral:   NA   Interview type:  Family Other interview type:   Patient's daughter interviewed at bedside.    PSYCHOSOCIAL DATA Living Status:  FACILITY Admitted from facility:  [name] Level of care:  [place] Primary support name:  Tanya Farmer Primary support relationship to patient:  CHILD, ADULT Degree of support available:   Support is strong.    CURRENT CONCERNS Current Concerns  Post-Acute Placement   Other Concerns:   NA    SOCIAL WORK ASSESSMENT / PLAN CSW met with patient and family at bedside to complete assessment. Patient's daughter Tanya Farmer is very involved in patient's care as she has been in the past. Tanya Farmer states that the patient was admitted from [facility] where she has been since January. Tanya Farmer lists concerns about [facility] She feels that the facility could do a better job of engaging the patient in daily activities and in assisting with ADLs (bathing, going to the bathroom etc.). She expresses that she would like the doctor's assistance with developing a plan of care for patient at facility (she wants MD to write specific needs of patient in DC summary). Tanya Farmer states that she has shared this with the MD.    Tanya Farmer states that she is interested in seeing if [facility] has availability and would be an option for the patient. Daughter appears overwhelmed by trying to make the "right" decision for her mother as she becomes very tearful during assessment. CSW explained SNF search/placement process and answered questions. CSW will update [facility] on patient's condition while  also determining what other facilities would be an option for the patient.   Assessment/plan status:  Psychosocial Support/Ongoing Assessment of Needs Other assessment/ plan:   Complete FL2, Fax, PASRR   Information/referral to community resources:   CSW contact information and SNF list given.    PATIENT'S/FAMILY'S RESPONSE TO PLAN OF CARE: Patient's daughter states that she is unsure if she will want patient to return to [facility] at discharge and would like to explore other options. CSW has encouraged daughter to express her concerns with facility. CSW spent approximately 1 hour with family at bedside.       Liz Beach MSW, [place], [place], [phone]

## 2014-09-03 DIAGNOSIS — D75839 Thrombocytosis, unspecified: Secondary | ICD-10-CM

## 2014-09-03 DIAGNOSIS — D473 Essential (hemorrhagic) thrombocythemia: Secondary | ICD-10-CM

## 2014-09-03 LAB — CBC
HCT: 33 % — ABNORMAL LOW (ref 36.0–46.0)
HCT: 32.5 % — ABNORMAL LOW (ref 36.0–46.0)
Hemoglobin: 10.3 g/dL — ABNORMAL LOW (ref 12.0–15.0)
Hemoglobin: 10.1 g/dL — ABNORMAL LOW (ref 12.0–15.0)
MCH: 32.6 pg (ref 26.0–34.0)
MCH: 32.4 pg (ref 26.0–34.0)
MCHC: 31.2 g/dL (ref 30.0–36.0)
MCHC: 31.1 g/dL (ref 30.0–36.0)
MCV: 104.4 fL — AB (ref 78.0–100.0)
MCV: 104.2 fL — ABNORMAL HIGH (ref 78.0–100.0)
Platelets: 956 10*3/uL (ref 150–400)
Platelets: 953 10*3/uL (ref 150–400)
RBC: 3.16 MIL/uL — AB (ref 3.87–5.11)
RBC: 3.12 MIL/uL — ABNORMAL LOW (ref 3.87–5.11)
RDW: 18.1 % — ABNORMAL HIGH (ref 11.5–15.5)
RDW: 18.1 % — AB (ref 11.5–15.5)
WBC: 4.3 10*3/uL (ref 4.0–10.5)
WBC: 5 10*3/uL (ref 4.0–10.5)

## 2014-09-03 LAB — PROTIME-INR
INR: 1.57 — ABNORMAL HIGH (ref 0.00–1.49)
Prothrombin Time: 18.9 seconds — ABNORMAL HIGH (ref 11.6–15.2)

## 2014-09-03 NOTE — Clinical Social Work Note (Signed)
CSW met with patient's daughter for about 20 minutes today. CSW provided [place] with available bed offers. At this time, patient does not have a bed offer from any other facility but [name]. [facility] is currently considering taking patient. [facility] has not responded to referral and [facility] has not offered patient a bed.    Bryant  MSW, LCSWA, LCASA, [phone] 

## 2014-09-03 NOTE — Progress Notes (Signed)
1 small bloody stool overnight per RN this morning.

## 2014-09-03 NOTE — Progress Notes (Signed)
Daughter inquiring about patient's status.  Daughter asks redundant questions and, despite thorough explanations, blames the facility for her mother's "obvious lack of care".  Is asking staff which nursing home is 'the best' for her mother.  I explained to daughter that while we can certainly offer recommendations for specific avenues of care, we cannot recommend specific patient facilities.  Advised daughter and family to speak with doctor re:  Goals of care and what is resonable and prudent at this time for the patient.  Patient is clearly unable to make decisions on her own.   LSW and charge nurse also spoke with daughter.  ? Daughter's comprehension about how urinary tract infections can occur.  Educational material provided for her.

## 2014-09-03 NOTE — Progress Notes (Signed)
PROGRESS NOTE  TAMRE CASS FIE:[id number] DOB: 05/05/1926 DOA: 08/31/2014 PCP: Blanchie Serve, MD  HPI Tanya Farmer is a 81 y.o. female with PMH significant for Hypothyroidism, A fib, history of DVT on coumadin who was transfer from SNF due to blood in the stool. Per report patient had a bloody stool night prior to admission and the morning of admission. Rectal exam perform by ED physician showed dark maroon stool, occult blood positive.  Patient is on coumadin, INR at 2.4.   Subjective: Daughter with multiple concerns.  Wants to develop a care plan for the SNF so that her mother is given all possible treatments and she is not just left to "count down the days".   Dtr asks where rectal bleeding is coming from.  She appears to want endoscopic examination for her mother, but then is concerned that there are significant risks.   Assessment/Plan:  Lower GI Bleed with BRBPR - Bloody smear noted this morning.  Dtr reports stool the color of black oil.  Hgb 10.9 >>10.1 this hospitalization. - Appreciate [facility] consultation.  Supportive treatment for now. - Miralax daily. Patient tolerating clears.   - Coumadin being held.  Need to determine when to restart coumadin and aspirin.  Thrombocytosis/Polycythemia - Chronic. She follows regularly with [facility]. Noel Journey at [facility]. Drs. Fransisco Beau and [place] spoke on 1/6.  Dr. Fransisco Beau supports the coumadin being held. - Continue hydroxyurea. Coumadin/Aspirin being held due to bleed.   UTI - On ceftriaxone. Cultures shows e-coli sensitive to ceftriaxone.  Patient takes cranberry caps as well.   - At family's request we have called Urologist (Dr. Junious Silk)  for recommendations regarding treatment of UTI.  He recommends not treating it unless the patient is symptomatic.  As of 1/6 the patient will have received 4 doses of ceftriaxone completing  Her antibiotic treatment.  Willl d/c antibiotic after 1/6.  Dementia with AMS.   - Altered  Mental Status likely due to UTI.  Mental status is improved on 09/03/6061. - Uncertain of baseline mental status.  [name] is current calm and very pleasant. - MR Brain negative for acute changes or stroke - Namenda and Aricept are currently being held due to prolonged QTC.  Will need to discuss with DTR risks of restarting these.  Parkinsons - Continue Carbidopa-levodopa  HTN - Restarted Losartan that was held on admission due to GI bleed.  PRN hydralazine for SBP more than 160.   A fib - on coumadin. Hold anticoagulation in setting of bleed.   Prolonged QT - hold Aricept, nemanda. Magnesium level 1.9 (WNL)  Hypothyroidism - continue with synthroid.   Vaginal itching - per Daughter, patient has had this problem for months. Has used antifungal. Received 1 dose of IV diflucan.  Need outpatient GYN.   DVT Prophylaxis:  SCDs.  Code Status: Full code. Family Communication: discussed with daughter/son in law bedside for 40 min. Disposition Plan: return to SNF when appropriate.  Will need a Palliative Medicine GOC discussion at SNF.  Consultants:  [facility]   Procedures:  none  Antibiotics: Anti-infectives    Start     Dose/Rate Route Frequency Ordered Stop   09/01/14 1600  fluconazole (DIFLUCAN) IVPB 150 mg     150 mg75 mL/hr over 60 Minutes Intravenous  Once 09/01/14 1518 09/01/14 2036   09/01/14 1515  fluconazole (DIFLUCAN) IVPB 150 mg  Status:  Discontinued     150 mg75 mL/hr over 60 Minutes Intravenous Every 24 hours  09/01/14 1510 09/01/14 1518   08/31/14 1545  cefTRIAXone (ROCEPHIN) 1 g in dextrose 5 % 50 mL IVPB  Status:  Discontinued     1 g100 mL/hr over 30 Minutes Intravenous Every 24 hours 08/31/14 1532 09/03/14 1607     Objective: Filed Vitals:   09/03/14 0520 09/03/14 0528 09/03/14 0553 09/03/14 1325  BP:  164/68 168/70 146/61  Pulse:  87    Temp:  97.6 F (36.4 C)  97.8 F (36.6 C)  TempSrc:  Oral  Oral  Resp:  13    Height:      Weight: 57.6 kg (126 lb  15.8 oz)     SpO2:  100%  100%    Intake/Output Summary (Last 24 hours) at 09/03/14 1610 Last data filed at 09/03/14 1447  Gross per 24 hour  Intake 731.08 ml  Output      0 ml  Net 731.08 ml   Filed Weights   09/01/14 0523 09/02/14 0538 09/03/14 0520  Weight: 59.8 kg (131 lb 13.4 oz) 58 kg (127 lb 13.9 oz) 57.6 kg (126 lb 15.8 oz)   Exam: General: Well developed, well nourished, NAD, appears stated age.  Pleasantly demented. Smiles at me. HEENT:  Anicteic Sclera, MMM. No pharyngeal erythema or exudates  Neck: Supple, no JVD, no masses  Cardiovascular::  Irreg irreg, S1 S2 auscultated, no rubs, murmurs or gallops.   Respiratory: Clear to auscultation bilaterally with equal chest rise  Abdomen: Soft, nontender, nondistended, + bowel sounds  Extremities: warm dry without cyanosis clubbing or edema.  Skin: Without rashes exudates or nodules.    Data Reviewed: Basic Metabolic Panel:  Recent Labs Lab 08/31/14 1039 08/31/14 1616 09/01/14 0555  NA 144  --  142  K 3.5  --  4.0  CL 110  --  109  CO2  --   --  27  GLUCOSE 89  --  90  BUN 17  --  11  CREATININE 0.90  --  0.81  CALCIUM  --   --  9.2  MG  --  1.9  --    Liver Function Tests:  Recent Labs Lab 09/01/14 0555  AST 32  ALT 6  ALKPHOS 51  BILITOT 0.6  PROT 7.1  ALBUMIN 3.5   CBC:  Recent Labs Lab 08/31/14 1000  09/01/14 0619 09/01/14 1614 09/01/14 2329 09/02/14 0421 09/03/14 0600  WBC 5.8  < > 6.1 5.5 4.7 4.3 5.0  NEUTROABS 4.1  --   --   --   --   --   --   HGB 11.2*  < > 12.0 10.8* 10.3* 10.3* 10.1*  HCT 34.6*  < > 36.4 34.2* 31.9* 33.0* 32.5*  MCV 103.9*  < > 102.0* 102.1* 103.9* 104.4* 104.2*  PLT 1034*  < > 912* 970* 933* 956* 953*  < > = values in this interval not displayed. BNP (last 3 results)  Recent Labs  09/16/13 1730  PROBNP 619.3*   Studies: No results found. Scheduled Meds: . atorvastatin  10 mg Oral q1800  . carbidopa-levodopa  1 tablet Oral BID  . feeding supplement  (PRO-STAT SUGAR FREE 64)  30 mL Oral Daily  . feeding supplement (RESOURCE BREEZE)  1 Container Oral TID BM  . hydroxyurea  1,000 mg Oral Daily  . levothyroxine  150 mcg Oral QAC breakfast  . losartan  75 mg Oral Daily  . pantoprazole  40 mg Oral Daily  . polyethylene glycol  17 g Oral Daily  .  sodium chloride  3 mL Intravenous Q12H   Continuous Infusions: . dextrose 5 % and 0.9% NaCl 10 mL/hr at 09/02/14 1824   Principal Problem:   Acute lower GI bleeding Active Problems:   UTI (lower urinary tract infection)   Hypothyroidism   Essential hypertension, benign   Chronic anticoagulation   Embolism and thrombosis of arteries of lower extremity   Dementia with Parkinsonism   GI bleed  Karen Kitchens  [facility] Pager [phone]. If 7PM-7AM, please contact night-coverage at [email or web address], password [email or web address] 09/03/2014, 4:10 PM  LOS: 3 days    I have seen and evaluated this patient and agree with the PA notes. Elderly female with advanced demantia, only oriented to self, know birth month and date, not birth year. laying in bed denies discomfort. Calm and following commend. Per report minimal blood in stool today. Coumadin on hold. uti treated. Discharge to snf pending bed availability.

## 2014-09-04 LAB — PROTIME-INR
INR: 1.31 (ref 0.00–1.49)
Prothrombin Time: 16.4 seconds — ABNORMAL HIGH (ref 11.6–15.2)

## 2014-09-04 LAB — CBC
HEMATOCRIT: 34.6 % — AB (ref 36.0–46.0)
HEMOGLOBIN: 10.8 g/dL — AB (ref 12.0–15.0)
MCH: 32.2 pg (ref 26.0–34.0)
MCHC: 31.2 g/dL (ref 30.0–36.0)
MCV: 103.3 fL — ABNORMAL HIGH (ref 78.0–100.0)
Platelets: 927 10*3/uL (ref 150–400)
RBC: 3.35 MIL/uL — AB (ref 3.87–5.11)
RDW: 17.8 % — ABNORMAL HIGH (ref 11.5–15.5)
WBC: 5.1 10*3/uL (ref 4.0–10.5)

## 2014-09-04 MED ORDER — POLYETHYLENE GLYCOL 3350 17 GM/SCOOP PO POWD
0.5000 | Freq: Once | ORAL | Status: AC
  Administered 2014-09-04: 127.5 g via ORAL
  Filled 2014-09-04: qty 255

## 2014-09-04 MED ORDER — HYDROCORTISONE ACETATE 25 MG RE SUPP
25.0000 mg | Freq: Two times a day (BID) | RECTAL | Status: DC
  Filled 2014-09-04 (×3): qty 1

## 2014-09-04 NOTE — Progress Notes (Signed)
[place], PA made aware of patient's bloody bowel movement this afternoon. Bowel movement saturated one bed pad with thick, dark red color around bowel movement. Vital signs taken and stable following occurrence.

## 2014-09-04 NOTE — Progress Notes (Signed)
PROGRESS NOTE  Tanya Farmer YIF:[id number] DOB: [birth year] DOA: 08/31/2014 PCP: Blanchie Serve, MD  HPI Tanya Farmer is a 81 y.o. female with PMH significant for Hypothyroidism, A fib, history of DVT on coumadin who was transfer from SNF due to blood in the stool. Per report patient had a bloody stool night prior to admission and the morning of admission. Rectal exam perform by ED physician showed dark maroon stool, occult blood positive.  Patient is on coumadin, INR at 2.4 on admission.   Subjective: Patient pleasantly demented.  Denies pain.  Dtr at bedside.  Assessment/Plan:  Lower GI Bleed with BRBPR - Rectal exam on patient produced large red bloody bowel movement (1/7).  Hgb stable this hospitalization. - Appreciate [facility] consultation.  Patient's dtr agrees to colonoscopy. - Patient on clear liquids.  Slow prep for procedure started. - Coumadin being held.  Need to determine when/if appropriate to restart coumadin and aspirin.  Thrombocytosis/Polycythemia - Chronic. She follows regularly with Dr. Noel Journey at [facility]. Drs. Fransisco Beau and [place] spoke on 1/6.  Dr. Fransisco Beau supports the coumadin being held. - Continue hydroxyurea. Coumadin/Aspirin being held due to bleed.   UTI - On ceftriaxone. Cultures shows e-coli sensitive to ceftriaxone.  Patient takes cranberry caps as well.   - At family's request we have called Urologist (Dr. Junious Silk)  for recommendations regarding treatment of UTI.  He recommends not treating it unless the patient is symptomatic.  As of 1/6 the patient will have received 4 doses of ceftriaxone completing  Her antibiotic treatment. Completed antibiotic on 1/6.  Dementia with AMS.   - Altered Mental Status likely due to UTI.  Mental status is improved on 09/02/2014.   - She is currently at her baseline mental status. - MR Brain negative for acute changes or stroke - Namenda and Aricept are currently being held due to prolonged QTC.   Will need to discuss with DTR risks of restarting these.  Parkinsons - Continue Carbidopa-levodopa  HTN - Restarted Losartan that was held on admission due to GI bleed.  PRN hydralazine for SBP more than 160.   A fib -  Hold anticoagulation in setting of bleed.   Prolonged QT - hold Aricept, nemanda. Magnesium level 1.9 (WNL)  Hypothyroidism - continue with synthroid.   Vaginal itching - per Daughter, patient has had this problem for months. Has used antifungal. Received 1 dose of IV diflucan.  Need outpatient GYN.   DVT Prophylaxis:  SCDs.  Code Status: Full code. Family Communication: 1 hour discussion with dtr at bedside. Disposition Plan: return to SNF when appropriate.  Will need a Palliative Medicine GOC discussion at SNF.  Consultants:  [facility]   Procedures:  none  Antibiotics: Anti-infectives    Start     Dose/Rate Route Frequency Ordered Stop   09/01/14 1600  fluconazole (DIFLUCAN) IVPB 150 mg     150 mg75 mL/hr over 60 Minutes Intravenous  Once 09/01/14 1518 09/01/14 2036   09/01/14 1515  fluconazole (DIFLUCAN) IVPB 150 mg  Status:  Discontinued     150 mg75 mL/hr over 60 Minutes Intravenous Every 24 hours 09/01/14 1510 09/01/14 1518   08/31/14 1545  cefTRIAXone (ROCEPHIN) 1 g in dextrose 5 % 50 mL IVPB  Status:  Discontinued     1 g100 mL/hr over 30 Minutes Intravenous Every 24 hours 08/31/14 1532 09/03/14 1607     Objective: Filed Vitals:   09/03/14 1325 09/03/14 2140 09/04/14 8676 09/04/14 1514  BP: 146/61 126/70 152/75 137/70  Pulse:  75 87   Temp: 97.8 F (36.6 C) 98.6 F (37 C) 97.5 F (36.4 C) 98.5 F (36.9 C)  TempSrc: Oral Oral Oral Oral  Resp:  18 18 20   Height:      Weight:   59.3 kg (130 lb 11.7 oz)   SpO2: 100% 100% 94% 99%    Intake/Output Summary (Last 24 hours) at 09/04/14 1715 Last data filed at 09/04/14 1632  Gross per 24 hour  Intake 710.66 ml  Output      0 ml  Net 710.66 ml   Filed Weights   09/02/14 0538 09/03/14 0520  09/04/14 0620  Weight: 58 kg (127 lb 13.9 oz) 57.6 kg (126 lb 15.8 oz) 59.3 kg (130 lb 11.7 oz)   Exam: General: Well developed, well nourished, NAD, appears stated age.  Pleasantly demented. Smiles at me. HEENT:  Anicteic Sclera, MMM. No pharyngeal erythema or exudates  Neck: Supple, no JVD, no masses  Cardiovascular::  Irreg irreg, S1 S2 auscultated, no rubs, murmurs or gallops.   Respiratory: Clear to auscultation bilaterally with equal chest rise  Abdomen: Soft, nontender, nondistended, + bowel sounds.  Rectal exam - no masses, no hemorrhoids, large amount of dark red stool poured out after exam.  Extremities: warm dry without cyanosis clubbing or edema.  Skin: macular rash covering neck, right face, and part of chest.  Not excoriated, 1+ erythema.  Data Reviewed: Basic Metabolic Panel:  Recent Labs Lab 08/31/14 1039 08/31/14 1616 09/01/14 0555  NA 144  --  142  K 3.5  --  4.0  CL 110  --  109  CO2  --   --  27  GLUCOSE 89  --  90  BUN 17  --  11  CREATININE 0.90  --  0.81  CALCIUM  --   --  9.2  MG  --  1.9  --    Liver Function Tests:  Recent Labs Lab 09/01/14 0555  AST 32  ALT 6  ALKPHOS 51  BILITOT 0.6  PROT 7.1  ALBUMIN 3.5   CBC:  Recent Labs Lab 08/31/14 1000  09/01/14 1614 09/01/14 2329 09/02/14 0421 09/03/14 0600 09/04/14 0734  WBC 5.8  < > 5.5 4.7 4.3 5.0 5.1  NEUTROABS 4.1  --   --   --   --   --   --   HGB 11.2*  < > 10.8* 10.3* 10.3* 10.1* 10.8*  HCT 34.6*  < > 34.2* 31.9* 33.0* 32.5* 34.6*  MCV 103.9*  < > 102.1* 103.9* 104.4* 104.2* 103.3*  PLT 1034*  < > 970* 933* 956* 953* 927*  < > = values in this interval not displayed. BNP (last 3 results)  Recent Labs  09/16/13 1730  PROBNP 619.3*   Studies: No results found. Scheduled Meds: . atorvastatin  10 mg Oral q1800  . carbidopa-levodopa  1 tablet Oral BID  . feeding supplement (PRO-STAT SUGAR FREE 64)  30 mL Oral Daily  . feeding supplement (RESOURCE BREEZE)  1 Container Oral  TID BM  . hydrocortisone  25 mg Rectal BID  . hydroxyurea  1,000 mg Oral Daily  . levothyroxine  150 mcg Oral QAC breakfast  . losartan  75 mg Oral Daily  . pantoprazole  40 mg Oral Daily  . polyethylene glycol  17 g Oral Daily  . polyethylene glycol powder  0.5 Container Oral Once  . sodium chloride  3 mL Intravenous Q12H  Continuous Infusions: . dextrose 5 % and 0.9% NaCl 10 mL/hr at 09/02/14 1824   Principal Problem:   Acute lower GI bleeding Active Problems:   UTI (lower urinary tract infection)   Hypothyroidism   Essential hypertension, benign   Chronic anticoagulation   Embolism and thrombosis of arteries of lower extremity   Dementia with Parkinsonism   GI bleed   Thrombocythemia  Karen Kitchens  [facility] Pager [phone]. If 7PM-7AM, please contact night-coverage at [email or web address], password [facility] 09/04/2014, 5:15 PM  LOS: 4 days    I have seen and evaluated this patient and agree with the PA note. Persistent GI bleed, though h/h stable, vital stable, not in pain. Patient has advanced dementia, but following commend, NAD, for colonoscopy tomorrow. daughter and son in law in room. All questions answered.

## 2014-09-04 NOTE — Evaluation (Signed)
Physical Therapy Evaluation Patient Details Name: Tanya Farmer MRN: [id number] DOB: 03-Apr-1926 Today's Date: 09/04/2014   History of Present Illness  Pt is an 81 y.o. female with PMH significant for Hypothyroidism, A fib, history of DVT on coumadin who was transfer from SNF due to blood in the stool. Per report patient had Bloody stool night prior to admission and the morning of admission. Rectal exam perform by ED physician showed dark maroon stool, occult blood positive.   Clinical Impression  Pt admitted with above diagnosis. Pt currently with functional limitations due to the deficits listed below (see PT Problem List). At the time of PT eval mobility was limited by difficulty following commands. With 2 people present pt was able to be guided into standing and into recliner with min assist +2, however with +1 pt was making no movement initiation.   Pt will benefit from skilled PT to increase their independence and safety with mobility to allow discharge to the venue listed below.  Per daughter (present after session ended), she has been working with pt on ambulation and reports pt can walk ~400 feet with HHA. Plan is for discharge back into memory care, and daughter is requesting rehab start to see pt again vs. [facility] only.      Follow Up Recommendations SNF;Supervision/Assistance - 24 hour    Equipment Recommendations  None recommended by PT    Recommendations for Other Services       Precautions / Restrictions Precautions Precautions: Fall Restrictions Weight Bearing Restrictions: No      Mobility  Bed Mobility Overal bed mobility: Needs Assistance Bed Mobility: Supine to Sit     Supine to sit: Mod assist     General bed mobility comments: Pt required tactile cueing and assist to initiate LE movement to EOB as well as for trunk elevation. Pt had difficulty following verbal commands.   Transfers Overall transfer level: Needs assistance Equipment used: 2 person  hand held assist Transfers: Sit to/from [facility] Sit to Stand: Min assist;+2 physical assistance Stand pivot transfers: Min assist;+2 physical assistance       General transfer comment: Pt was able to perform transfers with +2 assist. Main limiting factor during this time was difficulty following commands. With 2 people present pt was able to stand with greater ease than with 1 person providing the same amount of assistance.   Ambulation/Gait             General Gait Details: Deferred due to safety  Stairs            Wheelchair Mobility    Modified Rankin (Stroke Patients Only)       Balance Overall balance assessment: Needs assistance Sitting-balance support: Feet supported;No upper extremity supported Sitting balance-Leahy Scale: Fair     Standing balance support: Bilateral upper extremity supported;During functional activity Standing balance-Leahy Scale: Poor Standing balance comment: Requires UE support to maintain standing balance.                             Pertinent Vitals/Pain Pain Assessment: Faces Faces Pain Scale: No hurt    Home Living Family/patient expects to be discharged to:: Skilled nursing facility                 Additional Comments: Pt from a memory care SNF where [facility] is working with pt on walking endurance per daughter    Prior Function Level of Independence: Needs  assistance   Gait / Transfers Assistance Needed: Uses a walker at times, HHA at times  ADL's / Homemaking Assistance Needed: Pt dependent with ADL's.  Comments: Supervision needed due to dementia for safety     Hand Dominance   Dominant Hand: Right    Extremity/Trunk Assessment   Upper Extremity Assessment: Defer to OT evaluation           Lower Extremity Assessment: Generalized weakness      Cervical / Trunk Assessment: Kyphotic  Communication   Communication: HOH  Cognition Arousal/Alertness:  Awake/alert Behavior During Therapy: WFL for tasks assessed/performed Overall Cognitive Status: History of cognitive impairments - at baseline       Memory: Decreased short-term memory              General Comments      Exercises        Assessment/Plan    PT Assessment Patient needs continued PT services  PT Diagnosis Difficulty walking;Generalized weakness   PT Problem List Decreased strength;Decreased range of motion;Decreased activity tolerance;Decreased balance;Decreased mobility;Decreased knowledge of use of DME;Decreased safety awareness;Decreased knowledge of precautions  PT Treatment Interventions DME instruction;Stair training;Gait training;Functional mobility training;Therapeutic activities;Therapeutic exercise;Neuromuscular re-education;Patient/family education   PT Goals (Current goals can be found in the Care Plan section) Acute Rehab PT Goals Patient Stated Goal: None stated by patient. PT Goal Formulation: Patient unable to participate in goal setting Time For Goal Achievement: 09/18/14 Potential to Achieve Goals: Fair    Frequency Min 2X/week   Barriers to discharge        Co-evaluation               End of Session Equipment Utilized During Treatment: Gait belt Activity Tolerance: Patient tolerated treatment well Patient left: in chair;with chair alarm set;with call bell/phone within reach Nurse Communication: Mobility status         Time: 8144-8185 PT Time Calculation (min) (ACUTE ONLY): 36 min   Charges:   PT Evaluation $Initial PT Evaluation Tier I: 1 Procedure PT Treatments $Therapeutic Activity: 23-37 mins   PT G Codes:        Rolinda Roan September 15, 2014, 11:38 AM   Rolinda Roan, PT, DPT Acute Rehabilitation Services Pager: [phone]

## 2014-09-04 NOTE — Evaluation (Signed)
Occupational Therapy Evaluation Patient Details Name: Tanya Farmer MRN: [id number] DOB: [birth year] Today's Date: 09/04/2014    History of Present Illness Pt is an 81 y.o. female with PMH significant for Hypothyroidism, A fib, history of DVT on coumadin who was transfer from SNF due to blood in the stool. Per report patient had Bloody stool night prior to admission and the morning of admission. Rectal exam perform by ED physician showed dark maroon stool, occult blood positive.    Clinical Impression   PTA pt lived on memory care unit and was dependent for ADLs and was ambulating with hand held assist. Pt currently requires total (A) for ADLs due to difficulty following commands and generalized weakness. D/C plan is SNF and all further OT needs will be met at next venue of care.     Follow Up Recommendations  SNF;Supervision/Assistance - 24 hour    Equipment Recommendations  Other (comment) (Defer to SNF)    Recommendations for Other Services       Precautions / Restrictions Precautions Precautions: Fall Restrictions Weight Bearing Restrictions: No      Mobility Bed Mobility               General bed mobility comments: Pt sitting up in recliner when OT arrived.   Transfers Overall transfer level: Needs assistance Equipment used: None (gait belt assisted transfer) Transfers: Sit to/from Stand Sit to Stand: Max assist         General transfer comment: Max (A) to stand with 1 person assist. Difficulty following commands.     Balance Overall balance assessment: Needs assistance Sitting-balance support: Feet supported;No upper extremity supported Sitting balance-Leahy Scale: Fair     Standing balance support: Bilateral upper extremity supported;During functional activity Standing balance-Leahy Scale: Poor                              ADL Overall ADL's : Needs assistance/impaired Eating/Feeding: Set up;Sitting   Grooming: Minimal  assistance;Sitting Grooming Details (indicate cue type and reason): Min (A) due to difficulty following commands.                                General ADL Comments: Pt is otherwise dependent for ADLs due to difficulty following commands and generalized weakness.      Vision                 Additional Comments: Pt not able to follow commands to assess.    Perception Perception Perception Tested?: No   Praxis Praxis Praxis tested?: Within functional limits    Pertinent Vitals/Pain Pain Assessment: Faces Faces Pain Scale: No hurt     Hand Dominance Right   Extremity/Trunk Assessment Upper Extremity Assessment Upper Extremity Assessment: Generalized weakness   Lower Extremity Assessment Lower Extremity Assessment: Defer to PT evaluation   Cervical / Trunk Assessment Cervical / Trunk Assessment: Kyphotic   Communication Communication Communication: HOH   Cognition Arousal/Alertness: Awake/alert Behavior During Therapy: WFL for tasks assessed/performed Overall Cognitive Status: History of cognitive impairments - at baseline       Memory: Decreased short-term memory                        Home Living Family/patient expects to be discharged to:: Skilled nursing facility  Additional Comments: Pt from a memory care SNF where [facility] is working with pt on walking endurance per daughter      Prior Functioning/Environment Level of Independence: Needs assistance  Gait / Transfers Assistance Needed: Uses a walker at times, HHA at times ADL's / Homemaking Assistance Needed: Pt dependent with ADL's. Communication / Swallowing Assistance Needed: HOH Comments: Supervision needed due to dementia for safety    OT Diagnosis: Generalized weakness;Cognitive deficits                End of Session Equipment Utilized During Treatment: Gait belt  Activity Tolerance: Patient tolerated  treatment well Patient left: in chair;with call bell/phone within reach;with chair alarm set   Time: 2567-2091 OT Time Calculation (min): 13 min Charges:  OT General Charges $OT Visit: 1 Procedure OT Evaluation $Initial OT Evaluation Tier I: 1 Procedure G-Codes:    Juluis Rainier 2014/09/19, 1:54 PM  Cyndie Chime, OTR/L Occupational Therapist [phone] (pager)

## 2014-09-05 LAB — CBC
HCT: 32.9 % — ABNORMAL LOW (ref 36.0–46.0)
Hemoglobin: 10.8 g/dL — ABNORMAL LOW (ref 12.0–15.0)
MCH: 33.3 pg (ref 26.0–34.0)
MCHC: 32.8 g/dL (ref 30.0–36.0)
MCV: 101.5 fL — ABNORMAL HIGH (ref 78.0–100.0)
PLATELETS: 812 10*3/uL — AB (ref 150–400)
RBC: 3.24 MIL/uL — ABNORMAL LOW (ref 3.87–5.11)
RDW: 17.7 % — ABNORMAL HIGH (ref 11.5–15.5)
WBC: 4.8 10*3/uL (ref 4.0–10.5)

## 2014-09-05 LAB — BASIC METABOLIC PANEL
ANION GAP: 6 (ref 5–15)
BUN: 10 mg/dL (ref 6–23)
CO2: 23 mmol/L (ref 19–32)
Calcium: 8.8 mg/dL (ref 8.4–10.5)
Chloride: 112 mEq/L (ref 96–112)
Creatinine, Ser: 0.62 mg/dL (ref 0.50–1.10)
GFR calc Af Amer: >90 mL/min (ref >90)
GFR calc non Af Amer: 79 mL/min — ABNORMAL LOW (ref >90)
Glucose, Bld: 85 mg/dL (ref 70–99)
Potassium: 4.2 mmol/L (ref 3.5–5.1)
Sodium: 141 mmol/L (ref 135–145)

## 2014-09-05 LAB — PROTIME-INR
INR: 1.65 — AB (ref 0.00–1.49)
PROTHROMBIN TIME: 19.7 s — AB (ref 11.6–15.2)

## 2014-09-05 LAB — MAGNESIUM: MAGNESIUM: 1.9 mg/dL (ref 1.5–2.5)

## 2014-09-05 MED ORDER — HYDROCORTISONE 1 % EX CREA
TOPICAL_CREAM | Freq: Two times a day (BID) | CUTANEOUS | Status: DC
  Filled 2014-09-05: qty 28

## 2014-09-05 MED ORDER — POLYETHYLENE GLYCOL 3350 17 GM/SCOOP PO POWD
0.5000 | Freq: Once | ORAL | Status: AC
  Administered 2014-09-05: 127.5 g via ORAL
  Filled 2014-09-05: qty 255

## 2014-09-05 NOTE — Progress Notes (Signed)
CARE MANAGEMENT NOTE 09/05/2014  Patient:  JOLLEEN, SEMAN   Account Number:  [id number]  Date Initiated:  09/02/2014  Documentation initiated by:  Tomi Bamberger  Subjective/Objective Assessment:   dx gib  admit- from [facility]- snf     Action/Plan:   Anticipated DC Date:  09/06/2014   Anticipated DC Plan:  [place]  In-house referral  Clinical Social Worker      DC Planning Services  CM consult      Choice offered to / List presented to:             Status of service:  In process, will continue to follow Medicare Important Message given?  YES (If response is "NO", the following Medicare IM given date fields will be blank) Date Medicare IM given:  09/02/2014 Medicare IM given by:  Tomi Bamberger Date Additional Medicare IM given:  09/05/2014 Additional Medicare IM given by:  [facility]  Discharge Disposition:    Per UR Regulation:  Reviewed for med. necessity/level of care/duration of stay  If discussed at [place] of Stay Meetings, dates discussed:    Comments:  09/02/14 [place], BSN [phone] patient from [facility], pateint will return to snf at dc.  CSW following.

## 2014-09-05 NOTE — Progress Notes (Signed)
Dr. Erlinda Hong notified of patient's 5 beats of Vtach. Vital signs stable.

## 2014-09-05 NOTE — Progress Notes (Signed)
Physical Therapy Treatment Patient Details Name: Tanya Farmer MRN: [id number] DOB: 08-28-1926 Today's Date: 09/05/2014    History of Present Illness Pt is an 81 y.o. female with PMH significant for Hypothyroidism, A fib, history of DVT on coumadin who was transfer from SNF due to blood in the stool. Per report patient had Bloody stool night prior to admission and the morning of admission. Rectal exam perform by ED physician showed dark maroon stool, occult blood positive.     PT Comments    Patient progressing slowly towards physical therapy goals. Requires min-mod assist for mobility today including bed mobility, transfers, and ambulating up to 30 feet with a rolling walker. Daughter educated throughout therapy session and is very actively involved in the care of her mother. Patient will continue to benefit from skilled physical therapy services to further improve independence with functional mobility.   Follow Up Recommendations  SNF;Supervision/Assistance - 24 hour     Equipment Recommendations  None recommended by PT    Recommendations for Other Services       Precautions / Restrictions Precautions Precautions: Fall Restrictions Weight Bearing Restrictions: No    Mobility  Bed Mobility Overal bed mobility: Needs Assistance Bed Mobility: Supine to Sit     Supine to sit: Mod assist     General bed mobility comments: Mod assist for truncal support and to scoot bed pad to edge of bed.  Transfers Overall transfer level: Needs assistance Equipment used: 2 person hand held assist;Rolling walker (2 wheeled) Transfers: Sit to/from Stand Sit to Stand: +2 physical assistance;Mod assist         General transfer comment: Mod assist for boost to stand +2. Required two attempts and to block feet from extending forward. Hand over hand assist for sequencing and facilitation. Attempts to sit prematurely and requires max cues with assist to maintain upright posture until bottom  directly over chair.  Ambulation/Gait Ambulation/Gait assistance: +2 physical assistance;Mod assist Ambulation Distance (Feet): 30 Feet Assistive device: Rolling walker (2 wheeled) Gait Pattern/deviations: Step-through pattern;Decreased stride length;Shuffle;Trunk flexed;Narrow base of support Gait velocity: very slow   General Gait Details: Consistant with typical parkinsonian type gait pattern. Freezes at door. Needs mod assist for walker control with frequent cues for facilitation of continued walking. Cues for larger steps and uprigth posture with forward gaze. Relatively stable with UE on rolling walker however resists movement at times, especially with turning.   Stairs            Wheelchair Mobility    Modified Rankin (Stroke Patients Only)       Balance                                    Cognition Arousal/Alertness: Awake/alert Behavior During Therapy: WFL for tasks assessed/performed Overall Cognitive Status: History of cognitive impairments - at baseline       Memory: Decreased short-term memory              Exercises      General Comments General comments (skin integrity, edema, etc.): Pt with wet bed pad. RN notified. Had lengthy discussion with pt and family discussing d/c recommendations and concerns pts daughter had concerning posture and daily care and SNF. Daughter states she is primary providor of mobility and exercises and needed educated on safe handling techniques and recommendations for positional care for pt at d/c.      Pertinent Vitals/Pain Pain Assessment:  No/denies pain  HR 79    Home Living                      Prior Function            PT Goals (current goals can now be found in the care plan section) Acute Rehab PT Goals Patient Stated Goal: None stated by patient. PT Goal Formulation: Patient unable to participate in goal setting Time For Goal Achievement: 09/18/14 Potential to Achieve Goals:  Fair Progress towards PT goals: Progressing toward goals    Frequency  Min 2X/week    PT Plan Current plan remains appropriate    Co-evaluation             End of Session Equipment Utilized During Treatment: Gait belt Activity Tolerance: Patient tolerated treatment well Patient left: in chair;with chair alarm set;with call bell/phone within reach;with family/visitor present     Time: 1459-1533 PT Time Calculation (min) (ACUTE ONLY): 34 min  Charges:  $Gait Training: 8-22 mins $Self Care/Home Management: 8-22                    G Codes:      Ellouise Newer 09-30-2014, 4:43 PM Camille Bal [place], [place]

## 2014-09-05 NOTE — Progress Notes (Signed)
PROGRESS NOTE  Tanya Farmer NKN:[id number] DOB: 1926/03/12 DOA: 08/31/2014 PCP: Blanchie Serve, MD  HPI Tanya Farmer is a 81 y.o. female with PMH significant for hypothyroidism, A fib, history of DVT and thrombocytosis on coumadin who was transferred from SNF due to blood in the stool. Per report patient had a bloody stool the night prior to admission and the morning of admission. Rectal exam perform by ED physician showed dark maroon stool, occult blood positive.  Patient is on coumadin, INR at 2.4 on admission.   Subjective: Patient is very pleasantly demented.  Denies pain.  Unable to communicate well.  Assessment/Plan:  Lower GI Bleed with BRBPR - Rectal exam on patient produced large red bloody bowel movement (1/7).  Hgb stable this hospitalization. 1/8 hgb pending. - Appreciate [facility] consultation.  Patient's dtr agrees to colonoscopy.  Dr. Watt Climes to speak with dtr this afternoon (1/8) - Patient on clear liquids.  Slow prep for procedure started on evening of 1/7 - patient reportedly taking prep very well. Probable colonoscopy on 1/9. - Coumadin being held.  Need to determine when/if appropriate to restart coumadin and aspirin.  Thrombocytosis/Polycythemia - Chronic. She follows regularly with Dr. Noel Journey at [facility]. Drs. Fransisco Beau and [place] spoke on 1/6.  Dr. Fransisco Beau supports the coumadin being held.   - During this hospitalization - platelets have remained in the high 900k range. - Continue hydroxyurea. Coumadin/Aspirin being held due to bleeding.   UTI - On ceftriaxone. Cultures shows e-coli sensitive to ceftriaxone.  Patient takes cranberry caps as well.   - At family's request we have called Urologist (Dr. Junious Silk)  for recommendations regarding treatment of UTI.  He recommends not treating it unless the patient is symptomatic.  - Completed antibiotic on 1/6.  Skin rash  Allergy or drug rash?  Dtr states it has been present for months. Right face,  neck and chest.  Does not appear to bother the patient.  Macular and confluent pink rash.  Will try some cortisone cream.  Will ask RN to trace it so we can track spread/recession.  Dementia with AMS.   - Altered Mental Status likely due to UTI.  Mental status is improved on 09/02/2014.   - She is currently at her baseline mental status. - MR Brain negative for acute changes or stroke - Namenda and Aricept are currently being held due to prolonged QTC.  Will need to discuss with DTR risks of restarting these.  Parkinsons - Continue Carbidopa-levodopa  HTN - Restarted Losartan that was held on admission due to GI bleed.  PRN hydralazine for SBP more than 160.   A fib -  Hold anticoagulation in setting of bleed.   Prolonged QT - hold Aricept, nemanda. Magnesium level 1.9 (WNL)  Hypothyroidism - continue with synthroid.   Vaginal itching - per Daughter, patient has had this problem for months. Has used antifungal. Received 1 dose of IV diflucan.  Need outpatient GYN.     DVT Prophylaxis:  SCDs.  Code Status: Full code. Family Communication: 1 hour discussion with dtr at bedside. Disposition Plan: return to SNF when appropriate.  Will need a Palliative Medicine GOC discussion at SNF.  Consultants:  [facility]   Procedures:  none  Antibiotics: Anti-infectives    Start     Dose/Rate Route Frequency Ordered Stop   09/01/14 1600  fluconazole (DIFLUCAN) IVPB 150 mg     150 mg75 mL/hr over 60 Minutes Intravenous  Once 09/01/14 1518 09/01/14  2036   09/01/14 1515  fluconazole (DIFLUCAN) IVPB 150 mg  Status:  Discontinued     150 mg75 mL/hr over 60 Minutes Intravenous Every 24 hours 09/01/14 1510 09/01/14 1518   08/31/14 1545  cefTRIAXone (ROCEPHIN) 1 g in dextrose 5 % 50 mL IVPB  Status:  Discontinued     1 g100 mL/hr over 30 Minutes Intravenous Every 24 hours 08/31/14 1532 09/03/14 1607     Objective: Filed Vitals:   09/04/14 0620 09/04/14 1514 09/04/14 2334 09/05/14 0704  BP:  152/75 137/70 158/78 153/94  Pulse: 87  67 78  Temp: 97.5 F (36.4 C) 98.5 F (36.9 C) 98.1 F (36.7 C) 97.7 F (36.5 C)  TempSrc: Oral Oral Oral Oral  Resp: 18 20 15 14   Height:      Weight: 59.3 kg (130 lb 11.7 oz)     SpO2: 94% 99% 98% 100%    Intake/Output Summary (Last 24 hours) at 09/05/14 1010 Last data filed at 09/04/14 1632  Gross per 24 hour  Intake 203.66 ml  Output      0 ml  Net 203.66 ml   Filed Weights   09/02/14 0538 09/03/14 0520 09/04/14 0620  Weight: 58 kg (127 lb 13.9 oz) 57.6 kg (126 lb 15.8 oz) 59.3 kg (130 lb 11.7 oz)   Exam: General: Well developed, well nourished, NAD, appears stated age.  Pleasantly demented. Smiles at me. HEENT:  Anicteic Sclera, MMM. No pharyngeal erythema or exudates  Neck: Supple, no JVD, no masses  Cardiovascular::  Irreg irreg, S1 S2 auscultated, no rubs, murmurs or gallops.   Respiratory: Clear to auscultation bilaterally with equal chest rise  Abdomen: Soft, nontender, nondistended, + bowel sounds.  Extremities: warm dry without cyanosis clubbing or edema.  Skin: macular rash covering neck, right face, and part of chest.  Not excoriated, 1-2+ erythema.  Data Reviewed: Basic Metabolic Panel:  Recent Labs Lab 08/31/14 1039 08/31/14 1616 09/01/14 0555  NA 144  --  142  K 3.5  --  4.0  CL 110  --  109  CO2  --   --  27  GLUCOSE 89  --  90  BUN 17  --  11  CREATININE 0.90  --  0.81  CALCIUM  --   --  9.2  MG  --  1.9  --    Liver Function Tests:  Recent Labs Lab 09/01/14 0555  AST 32  ALT 6  ALKPHOS 51  BILITOT 0.6  PROT 7.1  ALBUMIN 3.5   CBC:  Recent Labs Lab 08/31/14 1000  09/01/14 1614 09/01/14 2329 09/02/14 0421 09/03/14 0600 09/04/14 0734  WBC 5.8  < > 5.5 4.7 4.3 5.0 5.1  NEUTROABS 4.1  --   --   --   --   --   --   HGB 11.2*  < > 10.8* 10.3* 10.3* 10.1* 10.8*  HCT 34.6*  < > 34.2* 31.9* 33.0* 32.5* 34.6*  MCV 103.9*  < > 102.1* 103.9* 104.4* 104.2* 103.3*  PLT 1034*  < > 970* 933* 956*  953* 927*  < > = values in this interval not displayed. BNP (last 3 results)  Recent Labs  09/16/13 1730  PROBNP 619.3*   Studies: No results found. Scheduled Meds: . atorvastatin  10 mg Oral q1800  . carbidopa-levodopa  1 tablet Oral BID  . feeding supplement (PRO-STAT SUGAR FREE 64)  30 mL Oral Daily  . feeding supplement (RESOURCE BREEZE)  1 Container Oral TID BM  .  hydrocortisone cream   Topical BID  . hydroxyurea  1,000 mg Oral Daily  . levothyroxine  150 mcg Oral QAC breakfast  . losartan  75 mg Oral Daily  . pantoprazole  40 mg Oral Daily  . sodium chloride  3 mL Intravenous Q12H   Continuous Infusions: . dextrose 5 % and 0.9% NaCl 10 mL/hr at 09/02/14 1824   Principal Problem:   Acute lower GI bleeding Active Problems:   UTI (lower urinary tract infection)   Hypothyroidism   Essential hypertension, benign   Chronic anticoagulation   Embolism and thrombosis of arteries of lower extremity   Dementia with Parkinsonism   GI bleed   Thrombocythemia  Karen Kitchens  [facility] Pager [phone]. If 7PM-7AM, please contact night-coverage at [email or web address], password [facility] 09/05/2014, 10:10 AM  LOS: 5 days    I have examined the patient and reviewed the chart. I agree with the Advanced Practitioner's note. Patient is to have colonoscopy tomorrow. Had 5beats of NSVT, asymptomatic. Check k/mag/tsh. continue monitor.

## 2014-09-05 NOTE — Progress Notes (Signed)
Charm Rings 12:15 PM  Subjective: Patient seen and examined and discussed with my partners and the hospital team and the family in both the hospital computer chart in our office computer chart was reviewed and she was seen by me approximately a year and half ago for one time bleeding which has recurred in the last 2 weeks but she does not have any other complaints  Objective: Vital signs stable afebrile no acute distress abdomen is soft nontender labs stable  Assessment: Bright red blood per rectum in a patient with multiple medical problems on chronic Coumadin currently being held  Plan: The risks benefits methods of flexible sigmoidoscopy or colonoscopy was discussed with the patient and her family and will proceed tomorrow morning with further workup plans and recommendations pending those findings  [place]

## 2014-09-06 ENCOUNTER — Encounter (HOSPITAL_COMMUNITY): Payer: Self-pay | Admitting: Gastroenterology

## 2014-09-06 ENCOUNTER — Encounter (HOSPITAL_COMMUNITY)
Admission: EM | Disposition: A | Discharge status: Skilled Nursing Facility | Payer: Self-pay | Source: Home / Self Care | Attending: Internal Medicine

## 2014-09-06 HISTORY — PX: COLONOSCOPY: SHX5424

## 2014-09-06 LAB — BASIC METABOLIC PANEL
ANION GAP: 4 — AB (ref 5–15)
BUN: 6 mg/dL (ref 6–23)
CO2: 26 mmol/L (ref 19–32)
Calcium: 8.7 mg/dL (ref 8.4–10.5)
Chloride: 109 mEq/L (ref 96–112)
Creatinine, Ser: 0.72 mg/dL (ref 0.50–1.10)
GFR calc Af Amer: 87 mL/min — ABNORMAL LOW (ref >90)
GFR calc non Af Amer: 75 mL/min — ABNORMAL LOW (ref >90)
GLUCOSE: 88 mg/dL (ref 70–99)
Potassium: 3.2 mmol/L — ABNORMAL LOW (ref 3.5–5.1)
SODIUM: 139 mmol/L (ref 135–145)

## 2014-09-06 LAB — CBC
HCT: 33 % — ABNORMAL LOW (ref 36.0–46.0)
Hemoglobin: 10.7 g/dL — ABNORMAL LOW (ref 12.0–15.0)
MCH: 33.2 pg (ref 26.0–34.0)
MCHC: 32.4 g/dL (ref 30.0–36.0)
MCV: 102.5 fL — ABNORMAL HIGH (ref 78.0–100.0)
Platelets: 910 10*3/uL (ref 150–400)
RBC: 3.22 MIL/uL — ABNORMAL LOW (ref 3.87–5.11)
RDW: 17.5 % — ABNORMAL HIGH (ref 11.5–15.5)
WBC: 5.2 10*3/uL (ref 4.0–10.5)

## 2014-09-06 LAB — TSH: TSH: 6.808 u[IU]/mL — ABNORMAL HIGH (ref 0.350–4.500)

## 2014-09-06 LAB — PROTIME-INR
INR: 1.25 (ref 0.00–1.49)
Prothrombin Time: 15.8 seconds — ABNORMAL HIGH (ref 11.6–15.2)

## 2014-09-06 SURGERY — COLONOSCOPY
Anesthesia: Moderate Sedation

## 2014-09-06 MED ORDER — FENTANYL CITRATE 0.05 MG/ML IJ SOLN
INTRAMUSCULAR | Status: AC
  Filled 2014-09-06: qty 2

## 2014-09-06 MED ORDER — POTASSIUM CHLORIDE 20 MEQ/15ML (10%) PO SOLN
40.0000 meq | Freq: Every day | ORAL | Status: DC
Start: 2014-09-06 — End: 2014-09-09
  Administered 2014-09-06 – 2014-09-09 (×4): 40 meq via ORAL
  Filled 2014-09-06 (×4): qty 30

## 2014-09-06 MED ORDER — FENTANYL CITRATE 0.05 MG/ML IJ SOLN
INTRAMUSCULAR | Status: DC | PRN
  Administered 2014-09-06 (×2): 25 ug via INTRAVENOUS

## 2014-09-06 MED ORDER — SODIUM CHLORIDE 0.9 % IV SOLN
INTRAVENOUS | Status: DC
  Administered 2014-09-06: 500 mL via INTRAVENOUS

## 2014-09-06 MED ORDER — MIDAZOLAM HCL 5 MG/5ML IJ SOLN
INTRAMUSCULAR | Status: DC | PRN
  Administered 2014-09-06 (×2): 2 mg via INTRAVENOUS

## 2014-09-06 MED ORDER — MIDAZOLAM HCL 5 MG/ML IJ SOLN
INTRAMUSCULAR | Status: AC
  Filled 2014-09-06: qty 2

## 2014-09-06 MED ORDER — POTASSIUM CHLORIDE 20 MEQ/15ML (10%) PO SOLN
40.0000 meq | Freq: Every day | ORAL | Status: DC
  Filled 2014-09-06: qty 30

## 2014-09-06 MED ORDER — MAGNESIUM SULFATE IN D5W 10-5 MG/ML-% IV SOLN
1.0000 g | Freq: Once | INTRAVENOUS | Status: AC
  Administered 2014-09-06: 1 g via INTRAVENOUS
  Filled 2014-09-06: qty 100

## 2014-09-06 MED ORDER — POTASSIUM CHLORIDE 10 MEQ/100ML IV SOLN
10.0000 meq | INTRAVENOUS | Status: DC
  Filled 2014-09-06 (×4): qty 100

## 2014-09-06 NOTE — Op Note (Signed)
[facility] [place], [id number]   COLONOSCOPY PROCEDURE REPORT     EXAM DATE: 09/17/2014  PATIENT NAME:      Tanya Farmer, Tanya Farmer           MR #:      [id number] BIRTHDATE:       07/26/1926      VISIT #:     [id number]  ATTENDING:     Clarene Essex, MD     STATUS:     outpatient REFERRING MD: ASA CLASS:        Class III  INDICATIONS:  The patient is a 81 yr old female here for a colonoscopy due to hematochezia. PROCEDURE PERFORMED:     Colonoscopy, diagnostic MEDICATIONS:     Fentanyl 50 mcg IV and Versed 4 mg IV ESTIMATED BLOOD LOSS:     None  CONSENT: The patient understands the risks and benefits of the procedure and understands that these risks include, but are not limited to: sedation, allergic reaction, infection, perforation and/or bleeding. Alternative means of evaluation and treatment include, among others: physical exam, x-rays, and/or surgical intervention. The patient elects to proceed with this endoscopic procedure.  DESCRIPTION OF PROCEDURE: During intra-op preparation period all mechanical & medical equipment was checked for proper function. Hand hygiene and appropriate measures for infection prevention was taken. After the risks, benefits and alternatives of the procedure were thoroughly explained, Informed consent was verified, confirmed and timeout was successfully executed by the treatment team. A digital exam revealed no abnormalities of the rectum.      The Pentax Ped Colon [id number] endoscope was introduced through the anus and advanced to the cecum, which was identified by both the appendix and ileocecal valve.This did require some abdominal pressure but no position changes and her colon was tortuous and some looping and somewhat difficult due to diverticuli and some residual stool balls No adverse events experienced. The prep was adequate.. The instrument was then slowly withdrawn as the colon was fully  examined. the findings are recorded below but there was no signs of active bleeding but a few old clots were seen      Retroflexed views revealed internal Grade I hemorrhoids.  The scope was then completely withdrawn from the patient and the procedure terminated.  WITHDRAWAL TIME:    ADVERSE EVENTS:      There were no immediate complications.  IMPRESSIONS:     1. Small internal hemorrhoids 2. Left greater than right diverticuli 3. Otherwise within normal limits to the cecum without signs of active bleeding  RECOMMENDATIONS:     okay to advance diet and reassess blood thinner needs and possibly try a different blood thinner than Coumadin and check with her hematologist at [facility] as well as her cardiologist for their opinion and please let us know if we can be of any further assistance and I'm happy to see back as an outpatient when necessary RECALL:     as needed  Clarene Essex, MD eSigned:  Clarene Essex, MD 17-Sep-2014 10:10 AM   cc:  CPT CODES: ICD CODES:  The ICD and CPT codes recommended by this software are interpretations from the data that the clinical staff has captured with the software.  The verification of the translation of this report to the ICD and CPT codes and modifiers is the sole responsibility of the health care institution and practicing physician where this report was generated.  [employer]. will not be  held responsible for the validity of the ICD and CPT codes included on this report.  AMA assumes no liability for data contained or not contained herein. CPT is a Designer, television/film set of the [employer].   PATIENT NAME:  Tanya Farmer, Tanya Farmer MR#: [id number]

## 2014-09-06 NOTE — Progress Notes (Addendum)
PROGRESS NOTE  Tanya Farmer IDP:[id number] DOB: [birth year] DOA: 08/31/2014 PCP: Blanchie Serve, MD  HPI Tanya Farmer is a 81 y.o. female with PMH significant for hypothyroidism, A fib, history of DVT and thrombocytosis on coumadin who was transferred from SNF due to blood in the stool. Per report patient had a bloody stool the night prior to admission and the morning of admission. Rectal exam perform by ED physician showed dark maroon stool, occult blood positive.  Patient is on coumadin, INR at 2.4 on admission.   Subjective: Patient is back from colonoscopy. Daughter at bedside.  Assessment/Plan:  Lower GI Bleed with BRBPR - Rectal exam on patient produced large red bloody bowel movement (1/7).  Hgb stable this hospitalization.  - Appreciate [facility] consultation.  Colonoscopy 1/9, no active bleed. +hemorrhoids/diverticuli.   - Patient on clear liquids.  Daughter request swallow eval and try soft diet - coumadin stopped. Patient does has h/o paf, remote h/o dvtx1, chronic thrombocytosis, no history of fall, though daughter noticed progressive weakness in patient. Will need to weight risk of benefit of anticoagulation, apixaban may be a good choice due reported associated with less gi bleed.  Thrombocytosis/Polycythemia - Chronic. She follows regularly with Dr. Noel Journey at [facility]. Drs. Fransisco Beau and [place] spoke on 1/6.  Dr. Fransisco Beau supports the coumadin being held.   - During this hospitalization - platelets have remained in the high 900k range. - Continue hydroxyurea. Coumadin/Aspirin being held due to bleeding.   UTI - completed ceftriaxone treatment. Cultures shows e-coli sensitive to ceftriaxone.  Patient takes cranberry caps as well.   - At family's request we have called Urologist (Dr. Junious Silk)  for recommendations regarding treatment of UTI.  He recommends not treating it unless the patient is symptomatic.  - Completed antibiotic on 1/6.  Skin rash    Allergy or drug rash?  Dtr states it has been present for months. Right face, neck and chest.  Does not appear to bother the patient.  Macular and confluent pink rash.  Will try some cortisone cream.  Will ask RN to trace it so we can track spread/recession.  Dementia with AMS.   - Altered Mental Status likely due to UTI.  Mental status is improved on 09/02/2014.   - She is currently at her baseline mental status. - MR Brain negative for acute changes or stroke - Namenda and Aricept are currently being held due to prolonged QTC.  Will need to discuss with DTR risks of restarting these. -- had long conversation with daughter on 1/9 , daughter reported patient has been diagnosed with dementia more than 58yrs ago, explained to her about poor prognosis, she is interested in talking to hospice service. Also discussed code status with patient's daughter, she is considering options. Remain full code. Swallow eval pening, on aspiration precaution.  Parkinsons - Continue Carbidopa-levodopa  HTN - Restarted Losartan that was held on admission due to GI bleed.  PRN hydralazine for SBP more than 160.   A fib - (paf) Hold anticoagulation in setting of bleed.   Prolonged QT - hold Aricept, nemanda. Magnesium level 1.9 (WNL)  Hypothyroidism - continue with synthroid.   Vaginal itching - per Daughter, patient has had this problem for months. Has used antifungal. Received 1 dose of IV diflucan.  Need outpatient GYN.     DVT Prophylaxis:  SCDs.  Code Status: Full code. Family Communication: 1 hour discussion with dtr at bedside. Disposition Plan: return to SNF when appropriate.  Will need a Palliative Medicine GOC discussion at SNF.  Consultants:  [facility]   Procedures:  none  Antibiotics: Anti-infectives    Start     Dose/Rate Route Frequency Ordered Stop   09/01/14 1600  fluconazole (DIFLUCAN) IVPB 150 mg     150 mg75 mL/hr over 60 Minutes Intravenous  Once 09/01/14 1518 09/01/14 2036    09/01/14 1515  fluconazole (DIFLUCAN) IVPB 150 mg  Status:  Discontinued     150 mg75 mL/hr over 60 Minutes Intravenous Every 24 hours 09/01/14 1510 09/01/14 1518   08/31/14 1545  cefTRIAXone (ROCEPHIN) 1 g in dextrose 5 % 50 mL IVPB  Status:  Discontinued     1 g100 mL/hr over 30 Minutes Intravenous Every 24 hours 08/31/14 1532 09/03/14 1607     Objective: Filed Vitals:   09/06/14 1000 09/06/14 1015 09/06/14 1046 09/06/14 1429  BP: 132/68 108/62 117/63 148/56  Pulse:  68  64  Temp:    97.5 F (36.4 C)  TempSrc:    Axillary  Resp:  13  20  Height:      Weight:      SpO2:  99%  100%    Intake/Output Summary (Last 24 hours) at 09/06/14 1650 Last data filed at 09/06/14 1008  Gross per 24 hour  Intake    310 ml  Output      0 ml  Net    310 ml   Filed Weights   09/03/14 0520 09/04/14 0620 09/06/14 0308  Weight: 57.6 kg (126 lb 15.8 oz) 59.3 kg (130 lb 11.7 oz) 56.3 kg (124 lb 1.9 oz)   Exam: General: Well developed, well nourished, NAD, appears stated age.  Pleasantly demented. Smiles at me. HEENT:  Anicteic Sclera, MMM. No pharyngeal erythema or exudates  Neck: Supple, no JVD, no masses  Cardiovascular::  Irreg irreg, S1 S2 auscultated, no rubs, murmurs or gallops.   Respiratory: Clear to auscultation bilaterally with equal chest rise  Abdomen: Soft, nontender, nondistended, + bowel sounds.  Extremities: warm dry without cyanosis clubbing or edema.  Skin: macular rash covering neck, right face, and part of chest.  Not excoriated, 1-2+ erythema.  Data Reviewed: Basic Metabolic Panel:  Recent Labs Lab 08/31/14 1039 08/31/14 1616 09/01/14 0555 09/05/14 0933 09/05/14 2200 09/06/14 0455  NA 144  --  142 141  --  139  K 3.5  --  4.0 4.2  --  3.2*  CL 110  --  109 112  --  109  CO2  --   --  27 23  --  26  GLUCOSE 89  --  90 85  --  88  BUN 17  --  11 10  --  6  CREATININE 0.90  --  0.81 0.62  --  0.72  CALCIUM  --   --  9.2 8.8  --  8.7  MG  --  1.9  --   --  1.9  --     Liver Function Tests:  Recent Labs Lab 09/01/14 0555  AST 32  ALT 6  ALKPHOS 51  BILITOT 0.6  PROT 7.1  ALBUMIN 3.5   CBC:  Recent Labs Lab 08/31/14 1000  09/02/14 0421 09/03/14 0600 09/04/14 0734 09/05/14 0536 09/06/14 0455  WBC 5.8  < > 4.3 5.0 5.1 4.8 5.2  NEUTROABS 4.1  --   --   --   --   --   --   HGB 11.2*  < > 10.3* 10.1* 10.8*  10.8* 10.7*  HCT 34.6*  < > 33.0* 32.5* 34.6* 32.9* 33.0*  MCV 103.9*  < > 104.4* 104.2* 103.3* 101.5* 102.5*  PLT 1034*  < > 956* 953* 927* 812* 910*  < > = values in this interval not displayed. BNP (last 3 results)  Recent Labs  09/16/13 1730  PROBNP 619.3*   Studies: No results found. Scheduled Meds: . atorvastatin  10 mg Oral q1800  . carbidopa-levodopa  1 tablet Oral BID  . feeding supplement (PRO-STAT SUGAR FREE 64)  30 mL Oral Daily  . feeding supplement (RESOURCE BREEZE)  1 Container Oral TID BM  . hydroxyurea  1,000 mg Oral Daily  . levothyroxine  150 mcg Oral QAC breakfast  . losartan  75 mg Oral Daily  . magnesium sulfate 1 - 4 g bolus IVPB  1 g Intravenous Once  . pantoprazole  40 mg Oral Daily  . potassium chloride  40 mEq Oral Daily  . sodium chloride  3 mL Intravenous Q12H   Continuous Infusions: . dextrose 5 % and 0.9% NaCl 10 mL/hr at 09/02/14 1824   Principal Problem:   Acute lower GI bleeding Active Problems:   Hypothyroidism   Essential hypertension, benign   UTI (lower urinary tract infection)   Chronic anticoagulation   Embolism and thrombosis of arteries of lower extremity   Dementia with Parkinsonism   GI bleed   Thrombocythemia  Florencia Reasons MD/PhD  [facility] Pager [phone]. If 7PM-7AM, please contact night-coverage at [email or web address], password [facility] 09/06/2014, 4:50 PM  LOS: 6 days    Frequent NSVT on tele, with h/o paf, not on any nodal blocking agent for unclear reason, keep k.4, mag>2. Consider low dose betablocker.

## 2014-09-06 NOTE — Progress Notes (Signed)
CRITICAL VALUE ALERT  Critical value received:  plt 910  Date of notification:  09/06/13  Time of notification:  7:14 AM   Critical value read back:Yes.    Nurse who received alert:  Barrie Lyme   MD notified (1st page):  Erlinda Hong  Time of first page:  7:14 AM   MD notified (2nd page):  Time of second page:  Responding MD:  Erlinda Hong  Time MD responded:  7:14 AM

## 2014-09-06 NOTE — Progress Notes (Signed)
Charm Rings 9:07 AM  Subjective: Patient without any complaints and no obvious signs of bleeding according to the daughter  Objective: Vital signs stable afebrile no acute distress exam please see preassessment evaluation hemoglobin stable  Assessment: Lower GI bleeding  Plan: Okay to proceed with flexible sigmoidoscopy or colonoscopy today using moderate sedation  Erol Flanagin E

## 2014-09-07 DIAGNOSIS — I480 Paroxysmal atrial fibrillation: Secondary | ICD-10-CM

## 2014-09-07 LAB — BASIC METABOLIC PANEL
ANION GAP: 5 (ref 5–15)
BUN: 11 mg/dL (ref 6–23)
CALCIUM: 8.6 mg/dL (ref 8.4–10.5)
CO2: 25 mmol/L (ref 19–32)
Chloride: 110 mEq/L (ref 96–112)
Creatinine, Ser: 0.84 mg/dL (ref 0.50–1.10)
GFR, EST AFRICAN AMERICAN: 70 mL/min — AB (ref >90)
GFR, EST NON AFRICAN AMERICAN: 61 mL/min — AB (ref >90)
GLUCOSE: 91 mg/dL (ref 70–99)
POTASSIUM: 3.7 mmol/L (ref 3.5–5.1)
Sodium: 140 mmol/L (ref 135–145)

## 2014-09-07 LAB — CBC
HCT: 30.9 % — ABNORMAL LOW (ref 36.0–46.0)
HEMOGLOBIN: 9.8 g/dL — AB (ref 12.0–15.0)
MCH: 32.5 pg (ref 26.0–34.0)
MCHC: 31.7 g/dL (ref 30.0–36.0)
MCV: 102.3 fL — ABNORMAL HIGH (ref 78.0–100.0)
PLATELETS: 757 10*3/uL — AB (ref 150–400)
RBC: 3.02 MIL/uL — AB (ref 3.87–5.11)
RDW: 17.6 % — AB (ref 11.5–15.5)
WBC: 4.9 10*3/uL (ref 4.0–10.5)

## 2014-09-07 LAB — PROTIME-INR
INR: 1.2 (ref 0.00–1.49)
Prothrombin Time: 15.4 seconds — ABNORMAL HIGH (ref 11.6–15.2)

## 2014-09-07 MED ORDER — LEVOTHYROXINE SODIUM 175 MCG PO TABS
175.0000 ug | ORAL_TABLET | Freq: Every day | ORAL | Status: DC
  Administered 2014-09-08 – 2014-09-09 (×2): 175 ug via ORAL
  Filled 2014-09-07 (×3): qty 1

## 2014-09-07 MED ORDER — ACETAMINOPHEN 500 MG PO TABS
500.0000 mg | ORAL_TABLET | Freq: Four times a day (QID) | ORAL | Status: DC | PRN
  Administered 2014-09-07: 500 mg via ORAL
  Filled 2014-09-07: qty 1

## 2014-09-07 NOTE — Evaluation (Signed)
Clinical/Bedside Swallow Evaluation Patient Details  Name: Tanya Farmer MRN: [id number] Date of Birth: 10-29-1925  Today's Date: 09/07/2014 Time: 1478-2956 SLP Time Calculation (min) (ACUTE ONLY): 52 min  Past Medical History:  Past Medical History  Diagnosis Date  . Hypertension   . Hyperlipidemia   . Thyroid disease     Hypothyroidism  . GERD (gastroesophageal reflux disease)   . Arthritis     Degenerative  . Peripheral vascular disease     Left   Fem-Pop and  tibial Thrombectomy  . High platelet count   . Dementia   . Pain in toe of right foot   . Hypothyroidism   . Long term (current) use of anticoagulants 03/13/2014  . Neuromuscular disorder   . Parkinson's disease    Past Surgical History:  Past Surgical History  Procedure Laterality Date  . Femoral-popliteal bypass graft  2002    Left  Dr. Sherren Mocha Early  . Abdominal hysterectomy    . Cataract surgery      left eye   HPI:  Tanya Farmer is a 81 y.o. female with PMH significant for hypothyroidism, A fib, dementia, history of DVT and thrombocytosis on coumadin who was transferred from SNF due to blood in the stool, AMS.  Diagnosed with lower GI bleed. Patient currently on clear liquids. Daughter requesting swallow evaluation and to attempt soft diet.    Assessment / Plan / Recommendation Clinical Impression  Patient presents with a cognitively based dysphagia related to dementia, likely exacerbated with acute illness. While pharyngeal swallow appears WFL and patient without overt indication of aspiration, patient requires max SLP cueing (verbal, visual, tactile with HOH assist, contextual) for improved awareness of bolus and oral acceptance, often pursing lips at presentation of solids. Once orally accepted, transited well and swallow initiated with purees and thin liquids. Despite expectoration of soft solids today, daughter reports intake of soft solids usually good without overt s/s of aspiration. Recommend continuing  current diet with full supervision with meals and the following aspiration precautions: provide HOH assist with self feeding to encourage independence and maximize safety of swallow, check for oral pocketing, provide liquid wash every 2-3 bites to faciliate oral clearance of bolus, upright posture (90 degrees) and maintain upright posture for 30 min-1 hour after meals. Recommend SLP f/u at SNF to establish best diet recommendations and provide staff/caregiver education as needed for carryover of diet recommendations and safe swallowing strategies as patient will require long term full supervision/assistance with po intake. Daughter and son in law present with many appropriate questions regarding patient's diagnosis, deficits, prognosis, and plan of care. SLP appropriate questions answered in detail including education on diet recommendations and benefits of compensatory strategies/SLP f/u. Palliative care consult would be beneficial at this time to assist family with realistic expectations and decision making in light of progressive diagnosis. No further SLP needs indicated in the acute setting. Defer to SNF.     Aspiration Risk  Moderate    Diet Recommendation Dysphagia 3 (Mechanical Soft);Thin liquid   Liquid Administration via: Cup;Straw Medication Administration: Crushed with puree Supervision: Staff to assist with self feeding;Full supervision/cueing for compensatory strategies Compensations: Slow rate;Small sips/bites;Follow solids with liquid (liquids after 2-3 bites of solid) Postural Changes and/or Swallow Maneuvers: Seated upright 90 degrees;Upright 30-60 min after meal    Other  Recommendations Oral Care Recommendations: Oral care BID   Follow Up Recommendations  Skilled Nursing facility       Pertinent Vitals/Pain n/a  Swallow Study    General HPI: Tanya Farmer is a 81 y.o. female with PMH significant for hypothyroidism, A fib, dementia, history of DVT and thrombocytosis  on coumadin who was transferred from SNF due to blood in the stool, AMS.  Diagnosed with lower GI bleed. Patient currently on clear liquids. Daughter requesting swallow evaluation and to attempt soft diet.  Type of Study: Bedside swallow evaluation Previous Swallow Assessment: none noted in chart Diet Prior to this Study: Thin liquids (clear liquids) Temperature Spikes Noted: No Respiratory Status: Room air History of Recent Intubation: No Behavior/Cognition: Cooperative;Pleasant mood;Confused;Lethargic;Requires cueing;Doesn't follow directions Oral Cavity - Dentition: Adequate natural dentition Self-Feeding Abilities: Total assist Patient Positioning: Upright in bed Baseline Vocal Quality: Clear Volitional Cough: Cognitively unable to elicit Volitional Swallow: Unable to elicit    Oral/Motor/Sensory Function Overall Oral Motor/Sensory Function:  (appears WFL, unable to formally assess due to mentation)   Ice Chips Ice chips: Not tested   Thin Liquid Thin Liquid: Within functional limits Presentation: Cup;Straw    Nectar Thick Nectar Thick Liquid: Not tested   Honey Thick Honey Thick Liquid: Not tested   Puree Puree: Impaired Presentation: Spoon Oral Phase Impairments:  (reduced oral opening)   Solid   GO    Solid: Impaired Presentation: Spoon Oral Phase Impairments: Impaired anterior to posterior transit (reduced oral opening) Oral Phase Functional Implications:  (expectorated bolus)      Tanya Pilkington MA, CCC-SLP [phone]  Tanya Farmer Meryl 09/07/2014,1:20 PM

## 2014-09-07 NOTE — Progress Notes (Signed)
Charm Rings 10:23 AM  Subjective: Patient doing okay without any signs of bleeding or any complaints or any obvious post colonoscopy problems  Objective: Vital signs stable afebrile abdomen is soft nontender hemoglobin slight decrease  Assessment: Resolved GI bleeding questionable internal hemorrhoids versus diverticuli  Plan: Family requested discussion with her hematologist when [facility] and her cardiologist to decide on best anticoagulation for her and please call us if we could be of any further assistance and I'm happy to see back as an outpatient as needed  [facility] E

## 2014-09-07 NOTE — Progress Notes (Addendum)
PROGRESS NOTE  Tanya Farmer GEX:[id number] DOB: 02/05/1926 DOA: 08/31/2014 PCP: Blanchie Serve, MD  HPI Tanya Farmer is a 81 y.o. female with PMH significant for hypothyroidism, A fib, history of DVT and thrombocytosis on coumadin who was transferred from SNF due to blood in the stool. Per report patient had a bloody stool the night prior to admission and the morning of admission. Rectal exam perform by ED physician showed dark maroon stool, occult blood positive.  Patient is on coumadin, INR at 2.4 on admission.   Subjective: More awake this am. Daughter not in room.  Assessment/Plan:  Lower GI Bleed with BRBPR - Rectal exam on patient produced large red bloody bowel movement (1/7).  Hgb stable this hospitalization.  - Appreciate [facility] consultation.  Colonoscopy 1/9, no active bleed. +hemorrhoids/diverticuli.   - Patient on clear liquids.  Daughter request swallow eval and try soft diet - coumadin stopped. Patient does has h/o paf, remote h/o dvtx1, chronic thrombocytosis, no history of fall, though daughter noticed progressive weakness in patient. Will need to weight risk of benefit of anticoagulation, apixaban may be a good choice due reported associated with less gi bleed.  Thrombocytosis/Polycythemia - Chronic. She follows regularly with Dr. Noel Journey at [facility]. Drs. Fransisco Beau and Hall spoke on 1/6.  Dr. Fransisco Beau supports the coumadin being held.   - During this hospitalization - platelets have remained in the high 900k range. - Continue hydroxyurea. Coumadin/Aspirin being held due to bleeding.   UTI - completed ceftriaxone treatment. Cultures shows e-coli sensitive to ceftriaxone.  Patient takes cranberry caps as well.   - At family's request we have called Urologist (Dr. Junious Silk)  for recommendations regarding treatment of UTI.  He recommends not treating it unless the patient is symptomatic.  - Completed antibiotic on 1/6.  Skin rash  Allergy or drug  rash?  Dtr states it has been present for months. Right face, neck and chest.  Does not appear to bother the patient.  Macular and confluent pink rash.  Will try some cortisone cream.  Will ask RN to trace it so we can track spread/recession.  Dementia with AMS.   - Altered Mental Status likely due to UTI.  Mental status is improved on 09/02/2014.   - She is currently at her baseline mental status. - MR Brain negative for acute changes or stroke - Namenda and Aricept are currently being held due to prolonged QTC.  Will need to discuss with DTR risks of restarting these. -- had long conversation with daughter on 1/9 , daughter reported patient has been diagnosed with dementia more than 9yrs ago, explained to her about poor prognosis, she is interested in talking to hospice service. Also discussed code status with patient's daughter, she is considering options. Remain full code. Swallow eval pening, on aspiration precaution.  Parkinsons - Continue Carbidopa-levodopa  HTN - Restarted Losartan that was held on admission due to GI bleed.  PRN hydralazine for SBP more than 160.   A fib - (paf) Hold anticoagulation in setting of bleed.   Prolonged QT - hold Aricept, nemanda. Magnesium level 1.9 (WNL)  Hypothyroidism - continue with synthroid.   Vaginal itching - per Daughter, patient has had this problem for months. Has used antifungal. Received 1 dose of IV diflucan.  Need outpatient GYN.     DVT Prophylaxis:  SCDs.  Code Status: Full code. Family Communication: daughter not in room this am Disposition Plan: return to SNF when appropriate.  Will  need a Palliative Medicine GOC discussion at SNF.  Consultants:  [facility]   Procedures:  none  Antibiotics: Anti-infectives    Start     Dose/Rate Route Frequency Ordered Stop   09/01/14 1600  fluconazole (DIFLUCAN) IVPB 150 mg     150 mg75 mL/hr over 60 Minutes Intravenous  Once 09/01/14 1518 09/01/14 2036   09/01/14 1515  fluconazole  (DIFLUCAN) IVPB 150 mg  Status:  Discontinued     150 mg75 mL/hr over 60 Minutes Intravenous Every 24 hours 09/01/14 1510 09/01/14 1518   08/31/14 1545  cefTRIAXone (ROCEPHIN) 1 g in dextrose 5 % 50 mL IVPB  Status:  Discontinued     1 g100 mL/hr over 30 Minutes Intravenous Every 24 hours 08/31/14 1532 09/03/14 1607     Objective: Filed Vitals:   09/06/14 1429 09/06/14 2124 09/07/14 0541 09/07/14 1014  BP: 148/56 136/67 147/71 117/58  Pulse: 64  82   Temp: 97.5 F (36.4 C)  99.9 F (37.7 C)   TempSrc: Axillary  Oral   Resp: 20 16 18    Height:      Weight:   57.3 kg (126 lb 5.2 oz)   SpO2: 100% 100% 100%     Intake/Output Summary (Last 24 hours) at 09/07/14 1151 Last data filed at 09/07/14 1007  Gross per 24 hour  Intake    237 ml  Output      0 ml  Net    237 ml   Filed Weights   09/04/14 0620 09/06/14 0308 09/07/14 0541  Weight: 59.3 kg (130 lb 11.7 oz) 56.3 kg (124 lb 1.9 oz) 57.3 kg (126 lb 5.2 oz)   Exam: General: Well developed, well nourished, NAD, appears stated age.  Pleasantly demented. Smiles at me. HEENT:  Anicteic Sclera, MMM. No pharyngeal erythema or exudates  Neck: Supple, no JVD, no masses  Cardiovascular::  Irreg irreg, S1 S2 auscultated, no rubs, murmurs or gallops.   Respiratory: Clear to auscultation bilaterally with equal chest rise  Abdomen: Soft, nontender, nondistended, + bowel sounds.  Extremities: warm dry without cyanosis clubbing or edema.  Skin: macular rash covering neck, right face, and part of chest.  Not excoriated, 1-2+ erythema.  Data Reviewed: Basic Metabolic Panel:  Recent Labs Lab 08/31/14 1616 09/01/14 0555 09/05/14 0933 09/05/14 2200 09/06/14 0455 09/07/14 0644  NA  --  142 141  --  139 140  K  --  4.0 4.2  --  3.2* 3.7  CL  --  109 112  --  109 110  CO2  --  27 23  --  26 25  GLUCOSE  --  90 85  --  88 91  BUN  --  11 10  --  6 11  CREATININE  --  0.81 0.62  --  0.72 0.84  CALCIUM  --  9.2 8.8  --  8.7 8.6  MG 1.9   --   --  1.9  --   --    Liver Function Tests:  Recent Labs Lab 09/01/14 0555  AST 32  ALT 6  ALKPHOS 51  BILITOT 0.6  PROT 7.1  ALBUMIN 3.5   CBC:  Recent Labs Lab 09/03/14 0600 09/04/14 0734 09/05/14 0536 09/06/14 0455 09/07/14 0644  WBC 5.0 5.1 4.8 5.2 4.9  HGB 10.1* 10.8* 10.8* 10.7* 9.8*  HCT 32.5* 34.6* 32.9* 33.0* 30.9*  MCV 104.2* 103.3* 101.5* 102.5* 102.3*  PLT 953* 927* 812* 910* 757*   BNP (last 3 results)  Recent Labs  09/16/13 1730  PROBNP 619.3*   Studies: No results found. Scheduled Meds: . atorvastatin  10 mg Oral q1800  . carbidopa-levodopa  1 tablet Oral BID  . feeding supplement (PRO-STAT SUGAR FREE 64)  30 mL Oral Daily  . feeding supplement (RESOURCE BREEZE)  1 Container Oral TID BM  . hydroxyurea  1,000 mg Oral Daily  . levothyroxine  150 mcg Oral QAC breakfast  . losartan  75 mg Oral Daily  . pantoprazole  40 mg Oral Daily  . potassium chloride  40 mEq Oral Daily  . sodium chloride  3 mL Intravenous Q12H   Continuous Infusions: . dextrose 5 % and 0.9% NaCl 10 mL/hr at 09/06/14 1845   Principal Problem:   Acute lower GI bleeding Active Problems:   Hypothyroidism   Essential hypertension, benign   UTI (lower urinary tract infection)   Chronic anticoagulation   Embolism and thrombosis of arteries of lower extremity   Dementia with Parkinsonism   GI bleed   Thrombocythemia  Florencia Reasons MD/PhD  [facility] Pager [phone]. If 7PM-7AM, please contact night-coverage at [email or web address], password [facility] 09/07/2014, 11:51 AM  LOS: 7 days   RN report mild temp of 100, no diarrhea, no cough, no skin irritation, patient is incontinence. Tylenol prn, repeat ua. Daughter prefer to talk to social worker on Monday, also interested in talking to hospice service. Likely d/c to snf after palliative consult/hospice consult.  Swallow eval recommendation: Dysphagia 3 (Mechanical Soft);Thin liquid   Liquid Administration via:  Cup;Straw Medication Administration: Crushed with puree Supervision: Staff to assist with self feeding;Full supervision/cueing for compensatory strategies Compensations: Slow rate;Small sips/bites;Follow solids with liquid (liquids after 2-3 bites of solid) Postural Changes and/or Swallow Maneuvers: Seated upright 90 degrees;Upright 30-60 min after meal

## 2014-09-08 ENCOUNTER — Encounter (HOSPITAL_COMMUNITY): Payer: Self-pay | Admitting: Gastroenterology

## 2014-09-08 ENCOUNTER — Inpatient Hospital Stay (HOSPITAL_COMMUNITY): Payer: Medicare Other

## 2014-09-08 DIAGNOSIS — Z515 Encounter for palliative care: Secondary | ICD-10-CM

## 2014-09-08 DIAGNOSIS — R531 Weakness: Secondary | ICD-10-CM

## 2014-09-08 DIAGNOSIS — R630 Anorexia: Secondary | ICD-10-CM

## 2014-09-08 LAB — URINALYSIS, ROUTINE W REFLEX MICROSCOPIC
Bilirubin Urine: NEGATIVE
GLUCOSE, UA: NEGATIVE mg/dL
Hgb urine dipstick: NEGATIVE
KETONES UR: NEGATIVE mg/dL
LEUKOCYTES UA: NEGATIVE
NITRITE: NEGATIVE
Protein, ur: NEGATIVE mg/dL
Specific Gravity, Urine: 1.018 (ref 1.005–1.030)
UROBILINOGEN UA: 0.2 mg/dL (ref 0.0–1.0)
pH: 5 (ref 5.0–8.0)

## 2014-09-08 LAB — PROTIME-INR
INR: 1.23 (ref 0.00–1.49)
Prothrombin Time: 15.6 seconds — ABNORMAL HIGH (ref 11.6–15.2)

## 2014-09-08 LAB — BASIC METABOLIC PANEL
Anion gap: 7 (ref 5–15)
BUN: 12 mg/dL (ref 6–23)
CALCIUM: 8.1 mg/dL — AB (ref 8.4–10.5)
CO2: 20 mmol/L (ref 19–32)
CREATININE: 0.86 mg/dL (ref 0.50–1.10)
Chloride: 108 mEq/L (ref 96–112)
GFR calc Af Amer: 68 mL/min — ABNORMAL LOW (ref >90)
GFR, EST NON AFRICAN AMERICAN: 59 mL/min — AB (ref >90)
Glucose, Bld: 91 mg/dL (ref 70–99)
Potassium: 4.1 mmol/L (ref 3.5–5.1)
Sodium: 135 mmol/L (ref 135–145)

## 2014-09-08 LAB — PATHOLOGIST SMEAR REVIEW

## 2014-09-08 NOTE — Progress Notes (Addendum)
NUTRITION FOLLOW UP  Intervention:   -Continue Resource Breeze po TID, each supplement provides 250 kcal and 9 grams of protein -Continue 30 ml Prostat daily -RD to follow for goals of care  Nutrition Dx:   Inadequate oral intake related to altered GI function as evidenced by 25-35%, on clear liquid diet; progressing  Goal:   Pt will meet >90% of estimated nutritional needs; progressing  Monitor:   PO/supplement intake, labs, weight changes, I/O's, goals of care  Assessment:   Tanya Farmer is a 81 y.o. female with PMH significant for Hypothyroidism, A fib, history of DVT on coumadin who was transfer from SNF due to blood in the stool. Per report patient had a bloody stool night prior to admission and the morning of admission. Rectal exam perform by ED physician showed dark maroon stool, occult blood positive.  Patient is on coumadin, INR at 2.4 Pt s/p colonoscopy on 09/06/14 which revealed small internal hemorrhoids.  SLP evaluated by by BSE and recommended a dysphagia diet with thin liquids. Per SLP, pt with cognitively based dysphagia due to dementia and requires max cueing.  Intake has improved; PO: 25-60%. Pt isucrrently receiving a soft diet. She continues to receive Resource Breeze TID and Prostat daily, which she accepts approximately 50% of the time.  Per MD notes, pt daughter leaning towards hospice/palliative measures, likely to be addressed at SNF.  Labs reviewed. Calcium: 8.1.   Height: Ht Readings from Last 1 Encounters:  08/31/14 5' (1.524 m)    Weight Status:   Wt Readings from Last 1 Encounters:  09/07/14 126 lb 5.2 oz (57.3 kg)   09/01/14 131 lb 13.4 oz (59.8 kg)   Re-estimated needs:  Kcal: 1500-1700 Protein: 63-73 grams Fluid: 1.5-1.7 L  Skin: rash on rt neck or chest  Diet Order: DIET SOFT   Intake/Output Summary (Last 24 hours) at 09/08/14 1311 Last data filed at 09/08/14 1028  Gross per 24 hour  Intake    567 ml  Output      0 ml  Net    567 ml     Last BM: 07/10/15   Labs:   Recent Labs Lab 09/05/14 2200 09/06/14 0455 09/07/14 0644 09/08/14 0444  NA  --  139 140 135  K  --  3.2* 3.7 4.1  CL  --  109 110 108  CO2  --  26 25 20   BUN  --  6 11 12   CREATININE  --  0.72 0.84 0.86  CALCIUM  --  8.7 8.6 8.1*  MG 1.9  --   --   --   GLUCOSE  --  88 91 91    CBG (last 3)  No results for input(s): GLUCAP in the last 72 hours.  Scheduled Meds: . atorvastatin  10 mg Oral q1800  . carbidopa-levodopa  1 tablet Oral BID  . feeding supplement (PRO-STAT SUGAR FREE 64)  30 mL Oral Daily  . feeding supplement (RESOURCE BREEZE)  1 Container Oral TID BM  . hydroxyurea  1,000 mg Oral Daily  . levothyroxine  175 mcg Oral QAC breakfast  . losartan  75 mg Oral Daily  . pantoprazole  40 mg Oral Daily  . potassium chloride  40 mEq Oral Daily  . sodium chloride  3 mL Intravenous Q12H    Continuous Infusions:   Nioma Mccubbins A. Jimmye [place], RD, LDN, CDE Pager: [phone] After hours Pager: [phone]

## 2014-09-08 NOTE — Consult Note (Signed)
Patient CV:UDTH PASTY MANNINEN      DOB: 28-Jan-1926      YHO:[id number]     Consult Note from the Palliative Medicine Team at [facility] Requested by: Dr. Erlinda Hong     PCP: Blanchie Serve, MD Reason for Consultation: [place] - advanced dementia  Phone [phone]  Assessment of patients Current state: I had a long and thorough conversation with Ms. Ducat's daughter and son-in-law (Ms. & Mr. Ernestine Conrad). We discussed overall poor prognosis and expectations although Ms. Ernestine Conrad does contribute her mother's decline to being in a facility. We discussed how her dementia is playing a role in recurrent UTI, decreased intake, more difficulty in ambulation. However, she continues to allude to hopes that her mother will be able to come home (she would have to greatly improve for this to happen as Ms. Ernestine Conrad has her own health issues). Ms. Ernestine Conrad does feel guilt for having her mother in a facility as that was supposed to be temporary and I tried to reassure her that her mother's health issues would have led to her decline even at home. We discussed code status, artificial feeding, and all options for both palliative and hospice support in depth. They wish for more time and wish to discuss this with their pastor.    Goals of Care: 1.  Code Status: FULL - family understands she will remain full code until they tell us otherwise.    2. Disposition: SNF rehab.    3. Symptom Management:   1. Decreased appetite: Encourage small frequent meals allowing time for her to complete meal. Continue feeding supplements. Continue dysphagia diet but offer foods she likes.  2. Weakness: Continue medical management. PT/OT. Return to SNF for rehab.   4. Psychosocial: Emotional support provided to patient and to family during very difficult conversation.   5. Spiritual: Family is very spiritual and seek the guidance from their pastor for decisions.    Patient Documents Completed or Given: Document Given Completed   Advanced Directives Pkt    MOST    DNR    Gone from My Sight    Hard Choices yes     Brief HPI: 81 yo female admitted with bloody stools with hemorrhoids/diverticuli and stable hemoglobin. She has h/o chronic thrombocytosis/polycythemia and also on anticoagulation for Afib. Palliative care consult for progressing dementia complicated by Parkinsons with recurrent UTI. PMH reviewed below.    ROS: Unable to assess - confused.     PMH:  Past Medical History  Diagnosis Date  . Hypertension   . Hyperlipidemia   . Thyroid disease     Hypothyroidism  . GERD (gastroesophageal reflux disease)   . Arthritis     Degenerative  . Peripheral vascular disease     Left   Fem-Pop and  tibial Thrombectomy  . High platelet count   . Dementia   . Pain in toe of right foot   . Hypothyroidism   . Long term (current) use of anticoagulants 03/13/2014  . Neuromuscular disorder   . Parkinson's disease      PSH: Past Surgical History  Procedure Laterality Date  . Femoral-popliteal bypass graft  2002    Left  Dr. Sherren Mocha Early  . Abdominal hysterectomy    . Cataract surgery      left eye  . Colonoscopy N/A 09/06/2014    Procedure: COLONOSCOPY;  Surgeon: Jeryl Columbia, MD;  Location: [facility] ENDOSCOPY;  Service: Endoscopy;  Laterality: N/A;   I have reviewed the [place]  and SH and  If appropriate update it with new information. No Known Allergies Scheduled Meds: . atorvastatin  10 mg Oral q1800  . carbidopa-levodopa  1 tablet Oral BID  . feeding supplement (PRO-STAT SUGAR FREE 64)  30 mL Oral Daily  . feeding supplement (RESOURCE BREEZE)  1 Container Oral TID BM  . hydroxyurea  1,000 mg Oral Daily  . levothyroxine  175 mcg Oral QAC breakfast  . losartan  75 mg Oral Daily  . pantoprazole  40 mg Oral Daily  . potassium chloride  40 mEq Oral Daily  . sodium chloride  3 mL Intravenous Q12H   Continuous Infusions:  PRN Meds:.acetaminophen, hydrALAZINE, HYDROcodone-acetaminophen, morphine injection,  ondansetron **OR** ondansetron (ZOFRAN) IV    BP 153/79 mmHg  Pulse 80  Temp(Src) 99.2 F (37.3 C) (Oral)  Resp 18  Ht 5' (1.524 m)  Wt 57.3 kg (126 lb 5.2 oz)  BMI 24.67 kg/m2  SpO2 100%   PPS: 20%   Intake/Output Summary (Last 24 hours) at 09/08/14 1406 Last data filed at 09/08/14 1028  Gross per 24 hour  Intake    567 ml  Output      0 ml  Net    567 ml    Physical Exam:  General: NAD, elderly, frail, lying in bed HEENT: Temporal muscle wasting Chest: No labored breathing, symmetric CVS: Irreg Abdomen: Soft, NT, ND, +BS Ext: MAE, no edema, warm to touch Neuro: Awake, oriented to self only  Labs: CBC    Component Value Date/Time   WBC 4.9 09/07/2014 0644   WBC 4.2 05/26/2014   RBC 3.02* 09/07/2014 0644   RBC 3.64* 01/21/2014 1123   HGB 9.8* 09/07/2014 0644   HCT 30.9* 09/07/2014 0644   PLT 757* 09/07/2014 0644   MCV 102.3* 09/07/2014 0644   MCH 32.5 09/07/2014 0644   MCH 35.4* 01/21/2014 1123   MCHC 31.7 09/07/2014 0644   MCHC 31.7 01/21/2014 1123   RDW 17.6* 09/07/2014 0644   RDW 17.4* 01/21/2014 1123   LYMPHSABS 1.0 08/31/2014 1000   LYMPHSABS 0.9 01/21/2014 1123   MONOABS 0.3 08/31/2014 1000   EOSABS 0.2 08/31/2014 1000   EOSABS 0.1 01/21/2014 1123   BASOSABS 0.2* 08/31/2014 1000   BASOSABS 0.1 01/21/2014 1123    BMET    Component Value Date/Time   NA 135 09/08/2014 0444   NA 142 05/26/2014   K 4.1 09/08/2014 0444   CL 108 09/08/2014 0444   CO2 20 09/08/2014 0444   GLUCOSE 91 09/08/2014 0444   GLUCOSE 85 01/21/2014 1123   BUN 12 09/08/2014 0444   BUN 7 05/26/2014   CREATININE 0.86 09/08/2014 0444   CREATININE 0.8 05/26/2014   CALCIUM 8.1* 09/08/2014 0444   GFRNONAA 59* 09/08/2014 0444   GFRAA 68* 09/08/2014 0444    CMP     Component Value Date/Time   NA 135 09/08/2014 0444   NA 142 05/26/2014   K 4.1 09/08/2014 0444   CL 108 09/08/2014 0444   CO2 20 09/08/2014 0444   GLUCOSE 91 09/08/2014 0444   GLUCOSE 85 01/21/2014 1123    BUN 12 09/08/2014 0444   BUN 7 05/26/2014   CREATININE 0.86 09/08/2014 0444   CREATININE 0.8 05/26/2014   CALCIUM 8.1* 09/08/2014 0444   PROT 7.1 09/01/2014 0555   PROT 7.0 01/21/2014 1123   ALBUMIN 3.5 09/01/2014 0555   AST 32 09/01/2014 0555   ALT 6 09/01/2014 0555   ALKPHOS 51 09/01/2014 0555   BILITOT 0.6 09/01/2014  0555   GFRNONAA 59* 09/08/2014 0444   GFRAA 68* 09/08/2014 0444     Time In Time Out Total Time Spent with Patient Total Overall Time  1030 1300 171min 110min    Greater than 50%  of this time was spent counseling and coordinating care related to the above assessment and plan.  Vinie Sill, NP Palliative Medicine Team Pager # [phone] (M-F 8a-5p) Team Phone # [phone] (Nights/Weekends)

## 2014-09-08 NOTE — Progress Notes (Signed)
PROGRESS NOTE  Tanya Farmer DXI:[id number] DOB: [birth year] DOA: 08/31/2014 PCP: Blanchie Serve, MD  HPI Tanya Farmer is a 81 y.o. female with PMH significant for hypothyroidism, A fib, history of DVT and thrombocytosis on coumadin who was transferred from SNF due to blood in the stool. Per report patient had a bloody stool the night prior to admission and the morning of admission. Rectal exam perform by ED physician showed dark maroon stool, occult blood positive.  Patient is on coumadin, INR at 2.4 on admission.   Subjective: More awake this am. Some intermittent nonproductive cough noted. Daughter not in room.  Assessment/Plan:  Lower GI Bleed with BRBPR - Rectal exam on patient produced large red bloody bowel movement (1/7).  Hgb stable this hospitalization.  - Appreciate [facility] consultation.  Colonoscopy 1/9, no active bleed. +hemorrhoids/diverticuli.   - Patient on clear liquids.  Daughter request swallow eval and try soft diet - coumadin stopped. Patient does has h/o paf, remote h/o dvtx1, chronic thrombocytosis, no history of fall, though daughter noticed progressive weakness in patient. Will need to weight risk of benefit of anticoagulation, apixaban may be a good choice due reported associated with less gi bleed.  Thrombocytosis/Polycythemia - Chronic. She follows regularly with Dr. Noel Journey at [facility]. Drs. Fransisco Beau and [place] spoke on 1/6.  Dr. Fransisco Beau supports the coumadin being held.   - During this hospitalization - platelets have remained in the high 900k range. - Continue hydroxyurea. Coumadin/Aspirin being held due to bleeding.   UTI - completed ceftriaxone treatment. Cultures shows e-coli sensitive to ceftriaxone.  Patient takes cranberry caps as well.   - At family's request we have called Urologist (Dr. Junious Silk)  for recommendations regarding treatment of UTI.  He recommends not treating it unless the patient is symptomatic.  - Completed  antibiotic on 1/6.  Skin rash  Allergy or drug rash?  Dtr states it has been present for months. Right face, neck and chest.  Does not appear to bother the patient.  Macular and confluent pink rash.  Will try some cortisone cream.  Will ask RN to trace it so we can track spread/recession.  Dementia with AMS.   - Altered Mental Status likely due to UTI.  Mental status is improved on 09/02/2014.   - She is currently at her baseline mental status. - MR Brain negative for acute changes or stroke - Namenda and Aricept are currently being held due to prolonged QTC.  Will need to discuss with DTR risks of restarting these. -- had long conversation with daughter on 1/9 , daughter reported patient has been diagnosed with dementia more than 45yrs ago, explained to her about poor prognosis, she is interested in talking to hospice service. Also discussed code status with patient's daughter, she is considering options. Remain full code. Swallow eval result noted, on aspiration precaution. Palliative care consulted, plan to d/c to snf tomorrow.  Parkinsons - Continue Carbidopa-levodopa  HTN - Restarted Losartan that was held on admission due to GI bleed.  PRN hydralazine for SBP more than 160.   A fib - (paf) Hold anticoagulation in setting of bleed.   Prolonged QT - hold Aricept, nemanda. Magnesium level 1.9 (WNL)  Hypothyroidism - continue with synthroid.   Vaginal itching - per Daughter, patient has had this problem for months. Has used antifungal. Received 1 dose of IV diflucan.  Need outpatient GYN.     DVT Prophylaxis:  SCDs.  Code Status: Full code. Family Communication:  daughter not in room this am Disposition Plan: return to SNF when appropriate.  Will need a Palliative Medicine GOC discussion at SNF.  Consultants:  [facility]  Palliative care consult   Procedures:  none  Antibiotics: Anti-infectives    Start     Dose/Rate Route Frequency Ordered Stop   09/01/14 1600   fluconazole (DIFLUCAN) IVPB 150 mg     150 mg75 mL/hr over 60 Minutes Intravenous  Once 09/01/14 1518 09/01/14 2036   09/01/14 1515  fluconazole (DIFLUCAN) IVPB 150 mg  Status:  Discontinued     150 mg75 mL/hr over 60 Minutes Intravenous Every 24 hours 09/01/14 1510 09/01/14 1518   08/31/14 1545  cefTRIAXone (ROCEPHIN) 1 g in dextrose 5 % 50 mL IVPB  Status:  Discontinued     1 g100 mL/hr over 30 Minutes Intravenous Every 24 hours 08/31/14 1532 09/03/14 1607     Objective: Filed Vitals:   09/07/14 1430 09/07/14 2216 09/08/14 0553 09/08/14 1419  BP: 100/63 118/72 153/79 134/60  Pulse: 86 80  76  Temp: 100 F (37.8 C) 100.5 F (38.1 C) 99.2 F (37.3 C) 98.4 F (36.9 C)  TempSrc: Axillary Oral Oral Oral  Resp: 18 18 18 16   Height:      Weight:      SpO2: 97% 100% 100% 100%    Intake/Output Summary (Last 24 hours) at 09/08/14 1821 Last data filed at 09/08/14 1428  Gross per 24 hour  Intake    667 ml  Output      0 ml  Net    667 ml   Filed Weights   09/04/14 0620 09/06/14 0308 09/07/14 0541  Weight: 59.3 kg (130 lb 11.7 oz) 56.3 kg (124 lb 1.9 oz) 57.3 kg (126 lb 5.2 oz)   Exam: General: Well developed, well nourished, NAD, appears stated age.  Pleasantly demented. Smiles at me. Not following commend HEENT:  Anicteic Sclera, MMM. No pharyngeal erythema or exudates  Neck: Supple, no JVD, no masses  Cardiovascular::  Irreg irreg, S1 S2 auscultated, no rubs, murmurs or gallops.   Respiratory: Clear to auscultation bilaterally with equal chest rise  Abdomen: Soft, nontender, nondistended, + bowel sounds.  Extremities: warm dry without cyanosis clubbing or edema.  Skin: macular rash covering neck, right face, and part of chest.  Not excoriated, 1-2+ erythema.  Data Reviewed: Basic Metabolic Panel:  Recent Labs Lab 09/05/14 0933 09/05/14 2200 09/06/14 0455 09/07/14 0644 09/08/14 0444  NA 141  --  139 140 135  K 4.2  --  3.2* 3.7 4.1  CL 112  --  109 110 108  CO2 23  --   26 25 20   GLUCOSE 85  --  88 91 91  BUN 10  --  6 11 12   CREATININE 0.62  --  0.72 0.84 0.86  CALCIUM 8.8  --  8.7 8.6 8.1*  MG  --  1.9  --   --   --    Liver Function Tests: No results for input(s): AST, ALT, ALKPHOS, BILITOT, PROT, ALBUMIN in the last 168 hours. CBC:  Recent Labs Lab 09/03/14 0600 09/04/14 0734 09/05/14 0536 09/06/14 0455 09/07/14 0644  WBC 5.0 5.1 4.8 5.2 4.9  HGB 10.1* 10.8* 10.8* 10.7* 9.8*  HCT 32.5* 34.6* 32.9* 33.0* 30.9*  MCV 104.2* 103.3* 101.5* 102.5* 102.3*  PLT 953* 927* 812* 910* 757*   BNP (last 3 results)  Recent Labs  09/16/13 1730  PROBNP 619.3*   Studies: No results found.  Scheduled Meds: . atorvastatin  10 mg Oral q1800  . carbidopa-levodopa  1 tablet Oral BID  . feeding supplement (PRO-STAT SUGAR FREE 64)  30 mL Oral Daily  . feeding supplement (RESOURCE BREEZE)  1 Container Oral TID BM  . hydroxyurea  1,000 mg Oral Daily  . levothyroxine  175 mcg Oral QAC breakfast  . losartan  75 mg Oral Daily  . pantoprazole  40 mg Oral Daily  . potassium chloride  40 mEq Oral Daily  . sodium chloride  3 mL Intravenous Q12H   Continuous Infusions:   Principal Problem:   Acute lower GI bleeding Active Problems:   Hypothyroidism   Essential hypertension, benign   UTI (lower urinary tract infection)   Chronic anticoagulation   Embolism and thrombosis of arteries of lower extremity   Dementia with Parkinsonism   GI bleed   Thrombocythemia  Florencia Reasons MD/PhD  [facility] Pager [phone]. If 7PM-7AM, please contact night-coverage at [email or web address], password [facility] 09/08/2014, 6:21 PM  LOS: 8 days    Swallow eval recommendation: Dysphagia 3 (Mechanical Soft);Thin liquid   Liquid Administration via: Cup;Straw Medication Administration: Crushed with puree Supervision: Staff to assist with self feeding;Full supervision/cueing for compensatory strategies Compensations: Slow rate;Small sips/bites;Follow solids with liquid (liquids  after 2-3 bites of solid) Postural Changes and/or Swallow Maneuvers: Seated upright 90 degrees;Upright 30-60 min after meal

## 2014-09-08 NOTE — Progress Notes (Signed)
Physical Therapy Treatment Patient Details Name: Tanya Farmer MRN: [id number] DOB: 1925-10-06 Today's Date: 09/08/2014    History of Present Illness Pt is an 81 y.o. female with PMH significant for Hypothyroidism, A fib, history of DVT on coumadin who was transfer from SNF due to blood in the stool. Per report patient had Bloody stool night prior to admission and the morning of admission. Rectal exam perform by ED physician showed dark maroon stool, occult blood positive.     PT Comments    Patient requiring +2 assist for mobility and transfers.  Agree with need for SNF at discharge.  Follow Up Recommendations  SNF;Supervision/Assistance - 24 hour     Equipment Recommendations  None recommended by PT    Recommendations for Other Services       Precautions / Restrictions Precautions Precautions: Fall Precaution Comments: Severe posterior lean/extension in sitting and with transfers Restrictions Weight Bearing Restrictions: No    Mobility  Bed Mobility Overal bed mobility: Needs Assistance Bed Mobility: Supine to Sit     Supine to sit: Max assist     General bed mobility comments: Repeated verbal cues and tactile cues to initiate movement.  Required step-by-step instructions to move to sitting.  Max assist to bring LE's off of bed and to raise trunk to sitting position.  Poor balance in sitting.  Transfers Overall transfer level: Needs assistance Equipment used: Rolling walker (2 wheeled);2 person hand held assist Transfers: Sit to/from [facility] Sit to Stand: Mod assist;+2 safety/equipment Stand pivot transfers: Mod assist;+2 physical assistance       General transfer comment: Patient with increased extension throughout with attempts to stand, pushing self posteriorly.  Required +2 assist to stand with RW.  Patient unable to problem solve to get hands onto handles of RW.  Patient with increased extension, pushing self posteriorly in stance.  Assisted  patient with pivot transfer to chair with +2 mod hand hold assist.  Required assist to scoot back into chair.  Ambulation/Gait             General Gait Details: Unable   Stairs            Wheelchair Mobility    Modified Rankin (Stroke Patients Only)       Balance Overall balance assessment: Needs assistance Sitting-balance support: Feet supported;No upper extremity supported Sitting balance-Leahy Scale: Poor Sitting balance - Comments: Patient leaning to left and posteriorly.  Provided verbal, tactile, and visual cueing to move to midline.  Patient unable to maintain upright, midline posture/balance without min assist. Postural control: Posterior lean;Left lateral lean Standing balance support: Bilateral upper extremity supported Standing balance-Leahy Scale: Poor                      Cognition Arousal/Alertness: Awake/alert Behavior During Therapy: WFL for tasks assessed/performed Overall Cognitive Status: History of cognitive impairments - at baseline (Difficulty following commands)                      Exercises      General Comments        Pertinent Vitals/Pain Pain Assessment: No/denies pain    Home Living                      Prior Function            PT Goals (current goals can now be found in the care plan section) Progress towards PT goals: Progressing toward  goals    Frequency  Min 2X/week    PT Plan Current plan remains appropriate    Co-evaluation             End of Session Equipment Utilized During Treatment: Gait belt Activity Tolerance: Patient tolerated treatment well Patient left: in chair;with call bell/phone within reach;with chair alarm set     Time: 2353-6144 PT Time Calculation (min) (ACUTE ONLY): 24 min  Charges:  $Therapeutic Activity: 23-37 mins                    G Codes:      Despina Pole 09-19-2014, 4:08 PM Carita Pian. Sanjuana Kava, [place] Pager [phone]

## 2014-09-09 LAB — PROTIME-INR
INR: 1.19 (ref 0.00–1.49)
PROTHROMBIN TIME: 15.2 s (ref 11.6–15.2)

## 2014-09-09 MED ORDER — GUAIFENESIN ER 600 MG PO TB12: 600.0000 mg | ORAL_TABLET | Freq: Two times a day (BID) | ORAL | Status: DC

## 2014-09-09 MED ORDER — AMOXICILLIN-POT CLAVULANATE 875-125 MG PO TABS: 1.0000 | ORAL_TABLET | Freq: Two times a day (BID) | ORAL | Status: AC

## 2014-09-09 MED ORDER — APIXABAN 2.5 MG PO TABS: 2.5000 mg | ORAL_TABLET | Freq: Two times a day (BID) | ORAL | Status: DC

## 2014-09-09 MED ORDER — ENSURE ACTIVE HEART HEALTH PO LIQD: 1.0000 | Freq: Two times a day (BID) | ORAL | Status: DC

## 2014-09-09 NOTE — Progress Notes (Signed)
Full note to follow:  I had a long and thorough conversation with Tanya Farmer's daughter and son-in-law (Ms. & Mr. Tanya Farmer). We discussed overall poor prognosis and expectations although Ms. Tanya Farmer does contribute her mother's decline to being in a facility. We discussed how her dementia is playing a role in recurrent UTI, decreased intake, more difficulty in ambulation. However, she continues to allude to hopes that her mother will be able to come home (she would have to greatly improve for this to happen as Ms. Tanya Farmer has her own health issues). Ms. Tanya Farmer does feel guilt for having her mother in a facility as that was supposed to be temporary and I tried to reassure her that her mother's health issues would have led to her decline even at home. We discussed code status, artificial feeding, and all options for both palliative and hospice support in depth. They wish for more time to consider options and wish to discuss this with their pastor. They would want her to come back to the hospital.   Vinie Sill, NP Palliative Medicine Team Pager # [phone] (M-F 8a-5p) Team Phone # [phone] (Nights/Weekends)

## 2014-09-09 NOTE — Clinical Social Work Note (Signed)
Per MD patient ready to DC back to [facility]. RN, patient/family ([place] and son in law at bedside), and facility notified of patient's DC. RN given number for report. DC packet on patient's chart. Ambulance transport will be requested for patient by RN (numbers provided on DC packet) once patient's daughter has returned to hospital with patient's clothing. CSW signing off at this time.   Liz Beach MSW, [place], [place], [phone]

## 2014-09-09 NOTE — Progress Notes (Signed)
Progress Note from the Palliative Medicine Team at [place]: I met again with Tanya Farmer and her daughter and son-in-law. I spent much time with family again discussing code status and poor prognosis. They continue to have many questions and difficulty understanding poor prognosis. They continue to have the same concerns and have difficulty accepting poor prognosis and natural disease progression of her dementia. They will need continued conversation and support.     Objective: No Known Allergies Scheduled Meds: . atorvastatin  10 mg Oral q1800  . carbidopa-levodopa  1 tablet Oral BID  . feeding supplement (PRO-STAT SUGAR FREE 64)  30 mL Oral Daily  . feeding supplement (RESOURCE BREEZE)  1 Container Oral TID BM  . hydroxyurea  1,000 mg Oral Daily  . levothyroxine  175 mcg Oral QAC breakfast  . losartan  75 mg Oral Daily  . pantoprazole  40 mg Oral Daily  . potassium chloride  40 mEq Oral Daily  . sodium chloride  3 mL Intravenous Q12H   Continuous Infusions:  PRN Meds:.acetaminophen, hydrALAZINE, HYDROcodone-acetaminophen, morphine injection, ondansetron **OR** ondansetron (ZOFRAN) IV  BP 148/82 mmHg  Pulse 85  Temp(Src) 98.2 F (36.8 C) (Oral)  Resp 18  Ht 5' (1.524 m)  Wt 58.1 kg (128 lb 1.4 oz)  BMI 25.02 kg/m2  SpO2 100%   PPS: 20%   Intake/Output Summary (Last 24 hours) at 09/09/14 1529 Last data filed at 09/09/14 0900  Gross per 24 hour  Intake    480 ml  Output      0 ml  Net    480 ml       Physical Exam:  General: NAD, elderly, frail, lying in bed HEENT: Temporal muscle wasting Chest: No labored breathing, symmetric CVS: Irreg Abdomen: Soft, NT, ND, +BS Ext: MAE, no edema, warm to touch Neuro: Awake, oriented to self only  Labs: CBC    Component Value Date/Time   WBC 4.9 09/07/2014 0644   WBC 4.2 05/26/2014   RBC 3.02* 09/07/2014 0644   RBC 3.64* 01/21/2014 1123   HGB 9.8* 09/07/2014 0644   HCT 30.9* 09/07/2014 0644   PLT 757*  09/07/2014 0644   MCV 102.3* 09/07/2014 0644   MCH 32.5 09/07/2014 0644   MCH 35.4* 01/21/2014 1123   MCHC 31.7 09/07/2014 0644   MCHC 31.7 01/21/2014 1123   RDW 17.6* 09/07/2014 0644   RDW 17.4* 01/21/2014 1123   LYMPHSABS 1.0 08/31/2014 1000   LYMPHSABS 0.9 01/21/2014 1123   MONOABS 0.3 08/31/2014 1000   EOSABS 0.2 08/31/2014 1000   EOSABS 0.1 01/21/2014 1123   BASOSABS 0.2* 08/31/2014 1000   BASOSABS 0.1 01/21/2014 1123    BMET    Component Value Date/Time   NA 135 09/08/2014 0444   NA 142 05/26/2014   K 4.1 09/08/2014 0444   CL 108 09/08/2014 0444   CO2 20 09/08/2014 0444   GLUCOSE 91 09/08/2014 0444   GLUCOSE 85 01/21/2014 1123   BUN 12 09/08/2014 0444   BUN 7 05/26/2014   CREATININE 0.86 09/08/2014 0444   CREATININE 0.8 05/26/2014   CALCIUM 8.1* 09/08/2014 0444   GFRNONAA 59* 09/08/2014 0444   GFRAA 68* 09/08/2014 0444    CMP     Component Value Date/Time   NA 135 09/08/2014 0444   NA 142 05/26/2014   K 4.1 09/08/2014 0444   CL 108 09/08/2014 0444   CO2 20 09/08/2014 0444   GLUCOSE 91 09/08/2014 0444   GLUCOSE 85 01/21/2014 1123  BUN 12 09/08/2014 0444   BUN 7 05/26/2014   CREATININE 0.86 09/08/2014 0444   CREATININE 0.8 05/26/2014   CALCIUM 8.1* 09/08/2014 0444   PROT 7.1 09/01/2014 0555   PROT 7.0 01/21/2014 1123   ALBUMIN 3.5 09/01/2014 0555   AST 32 09/01/2014 0555   ALT 6 09/01/2014 0555   ALKPHOS 51 09/01/2014 0555   BILITOT 0.6 09/01/2014 0555   GFRNONAA 59* 09/08/2014 0444   GFRAA 68* 09/08/2014 0444    Assessment and Plan: 1. Code Status: FULL 2. Symptom Control: 1. Decreased appetite: Encourage small frequent meals allowing time for her to complete meal. Continue feeding supplements. Continue dysphagia diet but offer foods she likes.  2. Weakness: Continue medical management. PT/OT. Return to SNF for rehab.  3. Psycho/Social: Emotional support provided to patient and family at bedside.  4. Spiritual: Family is very spiritual and  seek the guidance from their pastor for decisions.  5. Disposition: Return to SNF.     Time In Time Out Total Time Spent with Patient Total Overall Time  1445 1530 55mn 474m    Greater than 50%  of this time was spent counseling and coordinating care related to the above assessment and plan.  AlVinie SillNP Palliative Medicine Team Pager # [phone]-F 8a-5p) Team Phone # [phone]/Weekends)

## 2014-09-09 NOTE — Progress Notes (Signed)
CARE MANAGEMENT NOTE 09/09/2014  Patient:  Tanya Farmer, Tanya Farmer   Account Number:  [id number]  Date Initiated:  09/02/2014  Documentation initiated by:  Tomi Bamberger  Subjective/Objective Assessment:   dx gib  admit- from [facility]- snf     Action/Plan:   Anticipated DC Date:  09/09/2014   Anticipated DC Plan:  [place]  In-house referral  Clinical Social Worker      DC Planning Services  CM consult      Choice offered to / List presented to:             Status of service:  Completed, signed off Medicare Important Message given?  YES (If response is "NO", the following Medicare IM given date fields will be blank) Date Medicare IM given:  09/02/2014 Medicare IM given by:  Tomi Bamberger Date Additional Medicare IM given:  09/08/2014 Additional Medicare IM given by:  Tomi Bamberger  Discharge Disposition:  [place]  Per UR Regulation:  Reviewed for med. necessity/level of care/duration of stay  If discussed at [place] of Stay Meetings, dates discussed:    Comments:  09/02/14 [place], BSN [phone] patient from [place], pateint will return to snf at dc.  CSW following.

## 2014-09-09 NOTE — Discharge Summary (Signed)
Discharge Summary  Tanya Farmer TML:[id number] DOB: 1926-07-03  PCP: Blanchie Serve, MD  Admit date: 08/31/2014 Discharge date: 09/09/2014  Time spent: >39mins  Recommendations for Outpatient Follow-up: F/u with palliative care/hematology/pmd  Discharge Diagnoses:  Active Hospital Problems   Diagnosis Date Noted  . Acute lower GI bleeding 08/31/2014  . Thrombocythemia 09/03/2014  . GI bleed 08/31/2014  . Dementia with Parkinsonism 06/26/2014  . 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 Chronic anticoagulation 09/16/2013  . UTI (lower urinary tract infection) 09/16/2013  . Essential hypertension, benign 07/06/2013  . Hypothyroidism 07/05/2013    Resolved Hospital Problems   Diagnosis Date Noted Date Resolved  No resolved problems to display.    Discharge Condition: stable, improved  Diet recommendation: aspiration precaution,  intermittent swallow eval Swallow eval report on 09/07/2014 Dysphagia 3 (Mechanical Soft);Thin liquid   Liquid Administration via: Cup;Straw Medication Administration: Crushed with puree Supervision: Staff to assist with self feeding;Full supervision/cueing for compensatory strategies Compensations: Slow rate;Small sips/bites;Follow solids with liquid (liquids after 2-3 bites of solid) Postural Changes and/or Swallow Maneuvers: Seated upright 90 degrees;Upright 30-60 min after meal  Filed Weights   09/06/14 0308 09/07/14 0541 09/09/14 0500  Weight: 56.3 kg (124 lb 1.9 oz) 57.3 kg (126 lb 5.2 oz) 58.1 kg (128 lb 1.4 oz)    History of present illness:  Tanya Farmer is a 81 y.o. female with PMH significant for Hypothyroidism, A fib, history of DVT on coumadin who was transfer from SNF due to blood in the stool. Per report patient had Bloody stool night prior to admission and the morning of admission. Rectal exam perform by ED physician showed dark maroon stool, occult blood positive.  Patient is on coumadin, INR at  2.4.  Hospital Course:  Principal Problem:   Acute lower GI bleeding Active Problems:   Hypothyroidism   Essential hypertension, benign   UTI (lower urinary tract infection)   Chronic anticoagulation   Embolism and thrombosis of arteries of lower extremity   Dementia with Parkinsonism   GI bleed   Thrombocythemia  Cough: developed prior to discharge. cxr unremarkable, no leukocytosis, no fever. Started on mucinex/augmentin, pcp to continue monitor.  Lower GI Bleed with BRBPR - Rectal exam on patient produced large red bloody bowel movement (1/7). Hgb stable this hospitalization.  - Appreciate [facility] consultation. Colonoscopy 1/9, no active bleed. +hemorrhoids/diverticuli.  - on dysphagia diet per swallow eval, - coumadin stopped. Patient does has h/o paf, remote h/o dvtx1, chronic thrombocytosis, no history of fall, after long discussion with daughter, decided to initiate apixaban 2.5mg  po bid due to age/weight/frailty  Thrombocytosis/Polycythemia - Chronic. She follows regularly with Dr. Noel Journey at [facility]. Drs. Fransisco Beau and [place] spoke on 1/6. Dr. Fransisco Beau supports the coumadin being held.  - During this hospitalization - platelets have remained in the high 900k range. - Continue hydroxyurea/ Aspirin   UTI - completed ceftriaxone treatment. Cultures shows e-coli sensitive to ceftriaxone. Patient takes cranberry caps as well.  - At family's request we have called Urologist (Dr. Junious Silk) for recommendations regarding treatment of UTI. He recommends not treating it unless the patient is symptomatic.  - Completed antibiotic on 1/6. Repeat ua at time of discharge unremarkable.  Dementia with AMS.  - Altered Mental Status worse than baseline initial on presentation. likely due to UTI. Mental status back to baseline at the time of discharge. Only oriented to self, not following commend. - MR Brain negative for acute changes or  stroke - Namenda  and Aricept stopped due to prolonged QTC.. -- long conversation with daughter daily with multiple providers. daughter reported patient has been diagnosed with dementia more than 55yrs ago, explained to her about poor prognosis, palliative care consulted. Also discussed code status with patient's daughter, she is considering options. Remain full code.  --due to advanced dementia, patient need to be closely monitored/assisted. Daughter is very involved with patient's care.   Parkinsons - Continue Carbidopa-levodopa  HTN - Restarted Losartan that was held on admission.  A fib - (paf) anticoagulation on hold during hospitalization. apixaban 2.5mg  bid started at the time of discharge.   Prolonged QT - d/c Aricept/nemenda, namenda. Keep k.4, mag>2.  Hypothyroidism - continue with synthroid.   Vaginal itching - per Daughter, patient has had this problem for months. Has used antifungal. Received 1 dose of IV diflucan. Need outpatient GYN if symptom persist.    Procedures:  colonoscopy  Consultations:  GI/palliative care  Discharge Exam: BP 148/82 mmHg  Pulse 85  Temp(Src) 98.2 F (36.8 C) (Oral)  Resp 18  Ht 5' (1.524 m)  Wt 58.1 kg (128 lb 1.4 oz)  BMI 25.02 kg/m2  SpO2 100%  General: frail, advanced dementia, calm, pleasant at times Cardiovascular: afib Respiratory: poor respiratory effort, no wheezing, no rales, no rhonchi  Discharge Instructions You were cared for by a hospitalist during your hospital stay. If you have any questions about your discharge medications or the care you received while you were in the hospital after you are discharged, you can call the unit and asked to speak with the hospitalist on call if the hospitalist that took care of you is not available. Once you are discharged, your primary care physician will handle any further medical issues. Please note that NO REFILLS for any discharge medications will be authorized once you are discharged, as it is  imperative that you return to your primary care physician (or establish a relationship with a primary care physician if you do not have one) for your aftercare needs so that they can reassess your need for medications and monitor your lab values.  Discharge Instructions    Diet - low sodium heart healthy    Complete by:  As directed      Increase activity slowly    Complete by:  As directed             Medication List    STOP taking these medications        donepezil 10 MG tablet  Commonly known as:  ARICEPT     memantine 5 MG tablet  Commonly known as:  NAMENDA     polyethylene glycol packet  Commonly known as:  MIRALAX / GLYCOLAX     warfarin 5 MG tablet  Commonly known as:  COUMADIN      TAKE these medications        amoxicillin-clavulanate 875-125 MG per tablet  Commonly known as:  AUGMENTIN  Take 1 tablet by mouth 2 (two) times daily.     apixaban 2.5 MG Tabs tablet  Commonly known as:  ELIQUIS  Take 1 tablet (2.5 mg total) by mouth 2 (two) times daily.     aspirin 81 MG chewable tablet  Chew 81 mg by mouth daily.     atorvastatin 10 MG tablet  Commonly known as:  LIPITOR  Take 10 mg by mouth daily.     calcium-vitamin D 500-200 MG-UNIT per tablet  Commonly known as:  OSCAL WITH D  Take 1 tablet by mouth 2 (two) times daily.     carbidopa-levodopa 25-100 MG per tablet  Commonly known as:  SINEMET IR  Take 1 tablet by mouth 2 (two) times daily.     Cranberry 250 MG Caps  Take 250 mg by mouth daily.     feeding supplement (PRO-STAT SUGAR FREE 64) Liqd  Take 30 mLs by mouth daily.     Fish Oil 1000 MG Caps  Take 1 capsule (1,000 mg total) by mouth daily.     guaiFENesin 600 MG 12 hr tablet  Commonly known as:  MUCINEX  Take 1 tablet (600 mg total) by mouth 2 (two) times daily.     hydroxyurea 500 MG capsule  Commonly known as:  HYDREA  Take 1,000 mg by mouth daily.     levothyroxine 150 MCG tablet  Commonly known as:  SYNTHROID, LEVOTHROID    Take 1 tablet (150 mcg total) by mouth daily.     losartan 50 MG tablet  Commonly known as:  COZAAR  Take 1.5 tablets (75 mg total) by mouth daily.     MULTI COMPLETE PO  Take 1 tablet by mouth daily.     pantoprazole 20 MG tablet  Commonly known as:  PROTONIX  Take 20 mg by mouth daily.     senna-docusate 8.6-50 MG per tablet  Commonly known as:  Senokot-S  Take 1 tablet by mouth daily.       No Known Allergies     Follow-up Information    Follow up with Blanchie Serve, MD In 1 week.   Specialty:  Internal Medicine   Contact information:   [place] [address] [phone]       Follow up with Noel Journey, MD In 2 weeks.   Specialty:  Internal Medicine   Contact information:   [address] [facility] [id number] [place]  [address] [phone]        The results of significant diagnostics from this hospitalization (including imaging, microbiology, ancillary and laboratory) are listed below for reference.    Significant Diagnostic Studies: Mr Brain Wo Contrast  09/01/2014   CLINICAL DATA:  Increased confusion.  Initial evaluation.  EXAM: MRI HEAD WITHOUT CONTRAST  TECHNIQUE: Multiplanar, multiecho pulse sequences of the brain and surrounding structures were obtained without intravenous contrast.  COMPARISON:  Prior CT from 05/09/2014.  FINDINGS: Diffuse prominence of the CSF containing spaces is compatible with generalized cerebral atrophy. Extensive confluent T2/FLAIR hyperintensity involving the periventricular and deep white matter is seen, most consistent with advanced chronic microvascular ischemic disease. Small remote lacunar infarct present within the right basal ganglia and left thalamus.  No abnormal foci of restricted diffusion to suggest acute intracranial infarct. Gray-white matter differentiation maintained. No acute intracranial hemorrhage. Multiple scattered foci of susceptibility artifact present within the bilateral  cerebral hemispheres, most consistent with chronic small micro hemorrhages. These may be related chronic underlying hypertension.  No mass lesion or midline shift. No mass effect. Ventricular prominence related global parenchymal volume loss present without hydrocephalus. No extra-axial fluid collection.  Craniocervical junction within normal limits. Degenerative changes noted within the upper cervical spine.  Pituitary gland grossly stable. No acute abnormality seen about the orbits. Patient is status post lens replacement on the left.  Minimal fluid opacity noted within the inferior right maxillary sinus. Paranasal sinuses are otherwise clear. No mastoid effusion.  IMPRESSION: 1. No acute intracranial infarct or other abnormality identified. 2. Advanced cerebral atrophy with chronic microvascular  ischemic disease, grossly stable from prior.   Electronically Signed   By: Jeannine Boga M.D.   On: 09/01/2014 01:44   Dg Chest Port 1 View  09/08/2014   CLINICAL DATA:  Productive cough for 3 days.  EXAM: PORTABLE CHEST - 1 VIEW  COMPARISON:  05/09/2014  FINDINGS: Moderate cardiomegaly is stable. Both lungs are clear. No evidence of pleural effusion. Ectasia of the thoracic aorta appears stable.  IMPRESSION: Stable cardiomegaly.  No active lung disease.   Electronically Signed   By: Earle Gell M.D.   On: 09/08/2014 18:26    Microbiology: Recent Results (from the past 240 hour(s))  Urine culture     Status: None   Collection Time: 08/31/14  2:57 PM  Result Value Ref Range Status   Specimen Description URINE, CATHETERIZED  Final   Special Requests NONE  Final   Colony Count   Final    >=100,000 COLONIES/ML Performed at [facility]    Culture   Final    ESCHERICHIA COLI Performed at [facility]    Report Status 09/02/2014 FINAL  Final   Organism ID, Bacteria ESCHERICHIA COLI  Final      Susceptibility   Escherichia coli - MIC*    AMPICILLIN >=32 RESISTANT Resistant      CEFAZOLIN <=4 SENSITIVE Sensitive     CEFTRIAXONE <=1 SENSITIVE Sensitive     CIPROFLOXACIN <=0.25 SENSITIVE Sensitive     GENTAMICIN <=1 SENSITIVE Sensitive     LEVOFLOXACIN <=0.12 SENSITIVE Sensitive     NITROFURANTOIN <=16 SENSITIVE Sensitive     TOBRAMYCIN <=1 SENSITIVE Sensitive     TRIMETH/SULFA <=20 SENSITIVE Sensitive     PIP/TAZO <=4 SENSITIVE Sensitive     * ESCHERICHIA COLI  MRSA PCR Screening     Status: None   Collection Time: 09/02/14  8:18 AM  Result Value Ref Range Status   MRSA by PCR NEGATIVE NEGATIVE Final    Comment:        The GeneXpert MRSA Assay (FDA approved for NASAL specimens only), is one component of a comprehensive MRSA colonization surveillance program. It is not intended to diagnose MRSA infection nor to guide or monitor treatment for MRSA infections.      Labs: Basic Metabolic Panel:  Recent Labs Lab 09/05/14 0933 09/05/14 2200 09/06/14 0455 09/07/14 0644 09/08/14 0444  NA 141  --  139 140 135  K 4.2  --  3.2* 3.7 4.1  CL 112  --  109 110 108  CO2 23  --  26 25 20   GLUCOSE 85  --  88 91 91  BUN 10  --  6 11 12   CREATININE 0.62  --  0.72 0.84 0.86  CALCIUM 8.8  --  8.7 8.6 8.1*  MG  --  1.9  --   --   --    Liver Function Tests: No results for input(s): AST, ALT, ALKPHOS, BILITOT, PROT, ALBUMIN in the last 168 hours. No results for input(s): LIPASE, AMYLASE in the last 168 hours. No results for input(s): AMMONIA in the last 168 hours. CBC:  Recent Labs Lab 09/03/14 0600 09/04/14 0734 09/05/14 0536 09/06/14 0455 09/07/14 0644  WBC 5.0 5.1 4.8 5.2 4.9  HGB 10.1* 10.8* 10.8* 10.7* 9.8*  HCT 32.5* 34.6* 32.9* 33.0* 30.9*  MCV 104.2* 103.3* 101.5* 102.5* 102.3*  PLT 953* 927* 812* 910* 757*   Cardiac Enzymes: No results for input(s): CKTOTAL, CKMB, CKMBINDEX, TROPONINI in the last 168 hours. BNP: BNP (last  3 results)  Recent Labs  09/16/13 1730  PROBNP 619.3*   CBG: No results for input(s): GLUCAP in the last 168  hours.     Signed:  Brianda Beitler  [facility] 09/09/2014, 2:49 PM

## 2014-09-09 NOTE — Progress Notes (Addendum)
Pt prepared for d/c to SNF. IV d/c'd. Skin intact except as most recently charted. Vitals are stable. Report called to receiving facility. Pt to be transported by ambulance service. Called transport at [facility] after daughter came back to patient's room with her clothes.

## 2014-09-10 ENCOUNTER — Non-Acute Institutional Stay (SKILLED_NURSING_FACILITY): Payer: Medicare Other | Admitting: Registered Nurse

## 2014-09-10 ENCOUNTER — Encounter: Payer: Self-pay | Admitting: Registered Nurse

## 2014-09-10 DIAGNOSIS — E039 Hypothyroidism, unspecified: Secondary | ICD-10-CM

## 2014-09-10 DIAGNOSIS — K5901 Slow transit constipation: Secondary | ICD-10-CM

## 2014-09-10 DIAGNOSIS — R05 Cough: Secondary | ICD-10-CM

## 2014-09-10 DIAGNOSIS — F0280 Dementia in other diseases classified elsewhere without behavioral disturbance: Secondary | ICD-10-CM

## 2014-09-10 DIAGNOSIS — K219 Gastro-esophageal reflux disease without esophagitis: Secondary | ICD-10-CM

## 2014-09-10 DIAGNOSIS — E785 Hyperlipidemia, unspecified: Secondary | ICD-10-CM

## 2014-09-10 DIAGNOSIS — I10 Essential (primary) hypertension: Secondary | ICD-10-CM

## 2014-09-10 DIAGNOSIS — R1310 Dysphagia, unspecified: Secondary | ICD-10-CM

## 2014-09-10 DIAGNOSIS — R059 Cough, unspecified: Secondary | ICD-10-CM

## 2014-09-10 DIAGNOSIS — I480 Paroxysmal atrial fibrillation: Secondary | ICD-10-CM

## 2014-09-10 DIAGNOSIS — G3183 Dementia with Lewy bodies: Secondary | ICD-10-CM

## 2014-09-10 DIAGNOSIS — K922 Gastrointestinal hemorrhage, unspecified: Secondary | ICD-10-CM

## 2014-09-10 DIAGNOSIS — D473 Essential (hemorrhagic) thrombocythemia: Secondary | ICD-10-CM

## 2014-09-10 DIAGNOSIS — Z87440 Personal history of urinary (tract) infections: Secondary | ICD-10-CM

## 2014-09-10 LAB — CBC AND DIFFERENTIAL
HEMATOCRIT: 33 % — AB (ref 36–46)
HEMOGLOBIN: 10.6 g/dL — AB (ref 12.0–16.0)
Platelets: 878 10*3/uL — AB (ref 150–399)
WBC: 6.5 10^3/mL

## 2014-09-10 NOTE — Progress Notes (Signed)
Patient ID: Tanya Farmer, female   DOB: 05/09/1926, 81 y.o.   MRN: [id number]   Place of Service: [facility]  No Known Allergies  Code Status: Full Code  Goals of Care: Longevity/LTC  Chief Complaint  Patient presents with  . Hospitalization Follow-up    HPI 81 y.o. female LTC resident with PMH of PAF on chronic anticoagulation with coumadin, HTN, essential thrombocytosis, dementia with Parkinsonism, HLD among others is being seen for a follow-up visit post hospital admission from 08/31/14 to 09/09/14 for lower GI bleed with BRBPR. Her INR at hospital admission was 2.4 Colonoscopy 09/06/14 shows no active bleed, but positive for hemorrhoids/diverticuli. She was also treated for UTI with IV rocephin during hospitalization. Coumadin was discontinued and apixaban 2.5mg  twice day was initiated secondary to age/frailty. Namanda and aricept stopped due to prolonged QTC. Augmentin/mucinex started at hospital discharge for cough even though cxr unremarkable, no leukocytosis, and no fever. Seen in w/c today. Unable to participate in hpi and ros  Review of Systems Unable to obtain due to dementia but appears in no distress  Past Medical History  Diagnosis Date  . Hypertension   . Hyperlipidemia   . Thyroid disease     Hypothyroidism  . GERD (gastroesophageal reflux disease)   . Arthritis     Degenerative  . Peripheral vascular disease     Left   Fem-Pop and  tibial Thrombectomy  . High platelet count   . Dementia   . Pain in toe of right foot   . Hypothyroidism   . Long term (current) use of anticoagulants 03/13/2014  . Neuromuscular disorder   . Parkinson's disease     Past Surgical History  Procedure Laterality Date  . Femoral-popliteal bypass graft  2002    Left  Dr. Sherren Mocha Early  . Abdominal hysterectomy    . Cataract surgery      left eye  . Colonoscopy N/A 09/06/2014    Procedure: COLONOSCOPY;  Surgeon: Jeryl Columbia, MD;  Location: [facility];  Service: Endoscopy;   Laterality: N/A;    History  Substance Use Topics  . Smoking status: Former Research scientist (life sciences)  . Smokeless tobacco: Never Used  . Alcohol Use: No    Family History  Problem Relation Age of Onset  . Heart failure Mother   . Stroke Father       Medication List       This list is accurate as of: 09/10/14  3:18 PM.  Always use your most recent med list.               amoxicillin-clavulanate 875-125 MG per tablet  Commonly known as:  AUGMENTIN  Take 1 tablet by mouth 2 (two) times daily.     apixaban 2.5 MG Tabs tablet  Commonly known as:  ELIQUIS  Take 1 tablet (2.5 mg total) by mouth 2 (two) times daily.     aspirin 81 MG chewable tablet  Chew 81 mg by mouth daily.     atorvastatin 10 MG tablet  Commonly known as:  LIPITOR  Take 10 mg by mouth daily.     calcium-vitamin D 500-200 MG-UNIT per tablet  Commonly known as:  OSCAL WITH D  Take 1 tablet by mouth 2 (two) times daily.     carbidopa-levodopa 25-100 MG per tablet  Commonly known as:  SINEMET IR  Take 1 tablet by mouth 2 (two) times daily.     Cranberry 250 MG Caps  Take 250 mg by  mouth daily.     [place]  Take 1 Bottle by mouth 2 (two) times daily with a meal.     feeding supplement (PRO-STAT SUGAR FREE 64) Liqd  Take 30 mLs by mouth daily.     Fish Oil 1000 MG Caps  Take 1 capsule (1,000 mg total) by mouth daily.     guaiFENesin 600 MG 12 hr tablet  Commonly known as:  MUCINEX  Take 1 tablet (600 mg total) by mouth 2 (two) times daily.     hydroxyurea 500 MG capsule  Commonly known as:  HYDREA  Take 1,000 mg by mouth daily.     levothyroxine 150 MCG tablet  Commonly known as:  SYNTHROID, LEVOTHROID  Take 1 tablet (150 mcg total) by mouth daily.     losartan 50 MG tablet  Commonly known as:  COZAAR  Take 1.5 tablets (75 mg total) by mouth daily.     MULTI COMPLETE PO  Take 1 tablet by mouth daily.     pantoprazole 20 MG tablet  Commonly known as:  PROTONIX  Take 20 mg by  mouth daily.     senna-docusate 8.6-50 MG per tablet  Commonly known as:  Senokot-S  Take 1 tablet by mouth daily.        Physical Exam  BP 104/79 mmHg  Pulse 77  Temp(Src) 98.1 F (36.7 C)  Resp 17  Ht 5\' 5"  (1.651 m)  Wt 124 lb 4.8 oz (56.382 kg)  BMI 20.68 kg/m2  SpO2 98%  Constitutional: Thin elderly female in no acute distress. Sitting comfortable in chair.  HEENT: Normocephalic and atraumatic. PERRL. EOM intact. No icterus. Oral mucosa moist. Posterior pharynx clear of any exudate or lesions.  Neck: Supple and nontender. No lymphadenopathy, masses, or thyromegaly. No JVD or carotid bruits. Cardiac: Normal S1, S2. Irregularly irregular. Distal pulses intact. Trace pitting edema of BLE Lungs: No respiratory distress. Breath sounds clear bilaterally without rales, rhonchi, or wheezes. Abdomen: Audible bowel sounds in all quadrants. Soft, nontender, nondistended.  Musculoskeletal: able to move all extremities. Generalized weakness presents Skin: Warm and dry. No rash noted. No erythema.  Neurological: Alert  Psychiatric: Calm with confusions   Labs Reviewed  CBC Latest Ref Rng 09/07/2014 09/06/2014 09/05/2014  WBC 4.0 - 10.5 K/uL 4.9 5.2 4.8  Hemoglobin 12.0 - 15.0 g/dL 9.8(L) 10.7(L) 10.8(L)  Hematocrit 36.0 - 46.0 % 30.9(L) 33.0(L) 32.9(L)  Platelets 150 - 400 K/uL 757(H) 910(HH) 812(H)    CMP Latest Ref Rng 09/08/2014 09/07/2014 09/06/2014  Glucose 70 - 99 mg/dL 91 91 88  BUN 6 - 23 mg/dL 12 11 6   Creatinine 0.50 - 1.10 mg/dL 0.86 0.84 0.72  Sodium 135 - 145 mmol/L 135 140 139  Potassium 3.5 - 5.1 mmol/L 4.1 3.7 3.2(L)  Chloride 96 - 112 mEq/L 108 110 109  CO2 19 - 32 mmol/L 20 25 26   Calcium 8.4 - 10.5 mg/dL 8.1(L) 8.6 8.7  Total Protein 6.0 - 8.3 g/dL - - -  Albumin 3.5 - 4.7 g/dL - - -  Total Bilirubin 0.3 - 1.2 mg/dL - - -  Alkaline Phos 39 - 117 U/L - - -  AST 0 - 37 U/L - - -  ALT 0 - 35 U/L - - -    Lab Results  Component Value Date   HGBA1C 5.7*  01/21/2014    Lab Results  Component Value Date   TSH 6.808* 09/06/2014    Lipid Panel  Component Value Date/Time   CHOL 137 01/16/2014   TRIG 81 01/16/2014   LDLCALC 71 01/16/2014    Diagnostic Studies Reviewed 09/01/14: MRI head: No acute intracranial infarct or other abnormalities. Advanced cerebral atrophy with chronic microvascular ischemic disease, grossly stable.   09/06/14: Colonoscopy : 1. Small internal hemorrhoids 2. Left greater than right diverticuli 3. Otherwise within normal limits to the cecum without signs of active bleeding  Assessment & Plan 1. Dementia with Parkinsonism Namenda and aricept discontinued due to prolonged QTC. Continue Sinemet 25/100mg  two times daily. Continue to monitor for change in behaviors. Fall risk and pressure ulcer precautions  2. Paroxysmal atrial fibrillation Stable. Rate controlled. Continue apixaban/Eliquis 2.5mg  two times daily. Continue to monitor her status and monitor for signs of bleeding   3. Essential hypertension, benign Stable. Continue losartan 75mg  daily and monitor bp  4. Essential thrombocytosis Continue hydroxyurea 1000mg  daily. Continue to f/u with Dr. Fransisco Beau at [facility]  5. Dysphagia Continue mechanical soft diet with thin liquid. Aspiration precautions.   6. Gastroesophageal reflux disease, esophagitis presence not specified Continue protonix 20mg  daily.   7. Slow transit constipation Continue senna s 86/50mg  daily. Encourage hydration   8. Acute lower GI bleeding No active bleeding per colonoscopy 09/06/14. Hgb/hct at discharge 9.8/30.9. Continue to monitor h&h  9. Hx recurrent UTIs Urology consulted and recommended not treating it unless symptomatic. Continue cranberry 250mg  daily and monitor  10. HLD Continue lipitor 10mg  daily  11. Hypothyroidism Continue levothyroxine 137mcg daily. Continue to monitor  12. Cough Questionable URI. Continue Mucinex 600mg  twice daily and augmentin twice  daily until 09/13/14.   Diagnostic Studies/Labs Ordered: cbc in one week  Time spent: 45 minutes on care coordination  Family/Staff Communication Plan of care discussed with nursing staff. Nursing staff verbalized understanding and agree with plan of care. No additional questions or concerns reported.    Arthur Holms, MSN, AGNP-C [facility] [address] [place], [place] [address] [phone] [8am-5pm] After hours: [phone]

## 2014-09-11 ENCOUNTER — Non-Acute Institutional Stay (SKILLED_NURSING_FACILITY): Payer: Medicare Other | Admitting: Internal Medicine

## 2014-09-11 DIAGNOSIS — J069 Acute upper respiratory infection, unspecified: Secondary | ICD-10-CM

## 2014-09-11 DIAGNOSIS — I482 Chronic atrial fibrillation, unspecified: Secondary | ICD-10-CM

## 2014-09-11 DIAGNOSIS — K922 Gastrointestinal hemorrhage, unspecified: Secondary | ICD-10-CM

## 2014-09-11 DIAGNOSIS — K5900 Constipation, unspecified: Secondary | ICD-10-CM

## 2014-09-11 DIAGNOSIS — B9620 Unspecified Escherichia coli [E. coli] as the cause of diseases classified elsewhere: Secondary | ICD-10-CM

## 2014-09-11 DIAGNOSIS — R1310 Dysphagia, unspecified: Secondary | ICD-10-CM

## 2014-09-11 DIAGNOSIS — G3183 Dementia with Lewy bodies: Secondary | ICD-10-CM

## 2014-09-11 DIAGNOSIS — N390 Urinary tract infection, site not specified: Secondary | ICD-10-CM

## 2014-09-11 DIAGNOSIS — F0280 Dementia in other diseases classified elsewhere without behavioral disturbance: Secondary | ICD-10-CM

## 2014-09-11 DIAGNOSIS — I10 Essential (primary) hypertension: Secondary | ICD-10-CM

## 2014-09-11 NOTE — Progress Notes (Signed)
Patient ID: Tanya Farmer, female   DOB: Jun 24, 1926, 81 y.o.   MRN: [id number]     Facility: [facility]    PCP: Blanchie Serve, MD  Code Status: full code  No Known Allergies  Chief Complaint  Patient presents with  . New Admit To SNF     HPI:  81 year old patient is here for long term care post hospital admission from 08/31/14-09/09/14 with lower gi bleed. She underwent colonoscopy on 09/06/14 showing internal hemorrhoids and diverticulae. She also had e.coli UTI. Her coumadin was discontinued and she was started on apixaban. She has PMH of atrial fibrillation, dementia, HTN, HLD among others. She is seen in her room today. She is unable to provide any history of review of system. No new concern from staff. She is under total care.  Review of Systems:  Unable to obtain  Past Medical History  Diagnosis Date  . Hypertension   . Hyperlipidemia   . Thyroid disease     Hypothyroidism  . GERD (gastroesophageal reflux disease)   . Arthritis     Degenerative  . Peripheral vascular disease     Left   Fem-Pop and  tibial Thrombectomy  . High platelet count   . Dementia   . Pain in toe of right foot   . Hypothyroidism   . Long term (current) use of anticoagulants 03/13/2014  . Neuromuscular disorder   . Parkinson's disease    Past Surgical History  Procedure Laterality Date  . Femoral-popliteal bypass graft  2002    Left  Dr. Sherren Mocha Early  . Abdominal hysterectomy    . Cataract surgery      left eye  . Colonoscopy N/A 09/06/2014    Procedure: COLONOSCOPY;  Surgeon: Jeryl Columbia, MD;  Location: [facility] ENDOSCOPY;  Service: Endoscopy;  Laterality: N/A;   Social History:   reports that she has quit smoking. She has never used smokeless tobacco. She reports that she does not drink alcohol or use illicit drugs.  Family History  Problem Relation Age of Onset  . Heart failure Mother   . Stroke Father     Medications: Patient's Medications  New Prescriptions     No medications on file  Previous Medications   AMINO ACIDS-PROTEIN HYDROLYS (FEEDING SUPPLEMENT, PRO-STAT SUGAR FREE 64,) LIQD    Take 30 mLs by mouth daily.   AMOXICILLIN-CLAVULANATE (AUGMENTIN) 875-125 MG PER TABLET    Take 1 tablet by mouth 2 (two) times daily.   APIXABAN (ELIQUIS) 2.5 MG TABS TABLET    Take 1 tablet (2.5 mg total) by mouth 2 (two) times daily.   ASPIRIN 81 MG CHEWABLE TABLET    Chew 81 mg by mouth daily.   ATORVASTATIN (LIPITOR) 10 MG TABLET    Take 10 mg by mouth daily.   CALCIUM-VITAMIN D (OSCAL WITH D) 500-200 MG-UNIT PER TABLET    Take 1 tablet by mouth 2 (two) times daily.   CARBIDOPA-LEVODOPA (SINEMET IR) 25-100 MG PER TABLET    Take 1 tablet by mouth 2 (two) times daily.   CRANBERRY 250 MG CAPS    Take 250 mg by mouth daily.   GUAIFENESIN (MUCINEX) 600 MG 12 HR TABLET    Take 1 tablet (600 mg total) by mouth 2 (two) times daily.   HYDROXYUREA (HYDREA) 500 MG CAPSULE    Take 1,000 mg by mouth daily.    LEVOTHYROXINE (SYNTHROID, LEVOTHROID) 150 MCG TABLET    Take 1 tablet (150 mcg total)  by mouth daily.   LOSARTAN (COZAAR) 50 MG TABLET    Take 1.5 tablets (75 mg total) by mouth daily.   MULTIPLE VITAMINS-MINERALS (MULTI COMPLETE PO)    Take 1 tablet by mouth daily.   NUTRITIONAL SUPPLEMENTS ([place]) LIQD    Take 1 Bottle by mouth 2 (two) times daily with a meal.   OMEGA-3 FATTY ACIDS (FISH OIL) 1000 MG CAPS    Take 1 capsule (1,000 mg total) by mouth daily.   PANTOPRAZOLE (PROTONIX) 20 MG TABLET    Take 20 mg by mouth daily.   SENNA-DOCUSATE (SENOKOT-S) 8.6-50 MG PER TABLET    Take 1 tablet by mouth daily.  Modified Medications   No medications on file  Discontinued Medications   No medications on file     Physical Exam: Filed Vitals:   09/11/14 1839  BP: 104/79  Pulse: 77  Temp: 98.1 F (36.7 C)  Resp: 17  Height: 5\' 3"  (1.6 m)  Weight: 124 lb 4.8 oz (56.382 kg)  SpO2: 98%    General- elderly female in no acute distress Head-  normocephalic, atraumatic Throat- moist mucus membrane Neck- no cervical lymphadenopathy Cardiovascular- normal s1,s2, irregular heart rate, no murmurs,trace leg edema Respiratory- bilateral clear to auscultation, no wheeze, no rhonchi, no crackles, no use of accessory muscles Abdomen- bowel sounds present, soft, non tender Musculoskeletal- able to move all 4 extremities, on a recliner Skin- warm and dry Psychiatry- pleasantly confused    Labs reviewed: Basic Metabolic Panel:  Recent Labs  08/31/14 1616  09/05/14 2200 09/06/14 0455 09/07/14 0644 09/08/14 0444  NA  --   < >  --  139 140 135  K  --   < >  --  3.2* 3.7 4.1  CL  --   < >  --  109 110 108  CO2  --   < >  --  26 25 20   GLUCOSE  --   < >  --  88 91 91  BUN  --   < >  --  6 11 12   CREATININE  --   < >  --  0.72 0.84 0.86  CALCIUM  --   < >  --  8.7 8.6 8.1*  MG 1.9  --  1.9  --   --   --   < > = values in this interval not displayed. Liver Function Tests:  Recent Labs  09/17/13 0732 01/21/14 1123 05/09/14 1331 09/01/14 0555  AST 27 29 33 32  ALT 15 23 15 6   ALKPHOS 55 54 65 51  BILITOT 0.5 0.3 0.4 0.6  PROT 7.6 7.0 7.6 7.1  ALBUMIN 3.4*  --  3.4* 3.5   No results for input(s): LIPASE, AMYLASE in the last 8760 hours. No results for input(s): AMMONIA in the last 8760 hours. CBC:  Recent Labs  01/21/14 1123 01/22/14 1811  08/31/14 1000  09/05/14 0536 09/06/14 0455 09/07/14 0644  WBC 5.0 4.7  < > 5.8  < > 4.8 5.2 4.9  NEUTROABS 3.5 3.2  --  4.1  --   --   --   --   HGB 12.9 12.6  < > 11.2*  < > 10.8* 10.7* 9.8*  HCT 40.7 39.5  < > 34.6*  < > 32.9* 33.0* 30.9*  MCV 112* 113.2*  < > 103.9*  < > 101.5* 102.5* 102.3*  PLT 855* 794*  < > 1034*  < > 812* 910* 757*  < > =  values in this interval not displayed. Cardiac Enzymes:  Recent Labs  05/09/14 1333  TROPONINI <0.30   BNP: Invalid input(s): POCBNP CBG:  Recent Labs  09/17/13 0800 05/09/14 1410 05/10/14 0700  GLUCAP 131* 92 98     Radiological Exams: 09/01/14: MRI head: No acute intracranial infarct or other abnormalities. Advanced cerebral atrophy with chronic microvascular ischemic disease, grossly stable.   09/06/14: Colonoscopy : 1. Small internal hemorrhoids 2. Left greater than right diverticuli 3. Otherwise within normal limits to the cecum without signs of active bleeding   Assessment/Plan  GI bleed Thought to be from hemorrhoids and anticoagulation. Continue protonix 20 mg daily. Monitor cbc  Dementia With parkinson's features. Off aricept and namenda. Continue sinemet. Continue assistance with her ADLs, fall risk and pressure ulcer precautions  E.coli uti Completed rx. Continue cranberry supplements, hydration  Acute URI Complete course of augmentin on 09/13/14, on prn mucinex  Atrial fibrillation Stable. Rate controlled. Continue apixaban 2.5mg  two times daily. Continue statin  HTN bp stable. Continue losartan 75 mg daily, monitor bp  Dysphagia Continue mechanical soft diet with thin liquid. Aspiration precautions.   Constipation Continue senna s    Goals of care: long term care   Labs/tests ordered: cbc  Family/ staff Communication: reviewed care plan with patient and nursing supervisor    Blanchie Serve, MD  [facility] [phone] (Monday-Friday 8 am - 5 pm) [phone] (afterhours)

## 2014-09-12 ENCOUNTER — Non-Acute Institutional Stay (SKILLED_NURSING_FACILITY): Payer: Medicare Other | Admitting: Registered Nurse

## 2014-09-12 ENCOUNTER — Encounter: Payer: Self-pay | Admitting: Registered Nurse

## 2014-09-12 DIAGNOSIS — J069 Acute upper respiratory infection, unspecified: Secondary | ICD-10-CM

## 2014-09-12 NOTE — Progress Notes (Signed)
Patient ID: Tanya Farmer, female   DOB: 1925/09/24, 81 y.o.   MRN: [id number]   Place of Service: [facility]  No Known Allergies  Code Status: Full Code  Goals of Care: Longevity/Long term care  Chief Complaint  Patient presents with  . Acute Visit    cough-questionable aspiration    HPI 81 y.o. female with PMH of afib, HTN, Lower GI bleed, essential thrombocytosis, dementia with Parkinsonism, HLD among others is being seen for an acute visit at the request of daughter, speech, and nursing staff for cough and potential aspiration pneumonia. Seen in room today. Unable to participate in HPI and ROS. Has had the cough 1 day prior to discharge from recent hospitalization.   Review of Systems Unable to obtain due to dementia  Past Medical History  Diagnosis Date  . Hypertension   . Hyperlipidemia   . Thyroid disease     Hypothyroidism  . GERD (gastroesophageal reflux disease)   . Arthritis     Degenerative  . Peripheral vascular disease     Left   Fem-Pop and  tibial Thrombectomy  . High platelet count   . Dementia   . Pain in toe of right foot   . Hypothyroidism   . Long term (current) use of anticoagulants 03/13/2014  . Neuromuscular disorder   . Parkinson's disease     Past Surgical History  Procedure Laterality Date  . Femoral-popliteal bypass graft  2002    Left  Dr. Sherren Mocha Early  . Abdominal hysterectomy    . Cataract surgery      left eye  . Colonoscopy N/A 09/06/2014    Procedure: COLONOSCOPY;  Surgeon: Jeryl Columbia, MD;  Location: [facility] ENDOSCOPY;  Service: Endoscopy;  Laterality: N/A;    History   Social History  . Marital Status: Divorced    Spouse Name: N/A    Number of Children: N/A  . Years of Education: N/A   Occupational History  . Not on file.   Social History Main Topics  . Smoking status: Former Research scientist (life sciences)  . Smokeless tobacco: Never Used  . Alcohol Use: No  . Drug Use: No  . Sexual Activity: No   Other Topics Concern  . 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      Medication List       This list is accurate as of: 09/12/14 12:53 PM.  Always use your most recent med list.               amoxicillin-clavulanate 875-125 MG per tablet  Commonly known as:  AUGMENTIN  Take 1 tablet by mouth 2 (two) times daily.     apixaban 2.5 MG Tabs tablet  Commonly known as:  ELIQUIS  Take 1 tablet (2.5 mg total) by mouth 2 (two) times daily.     atorvastatin 10 MG tablet  Commonly known as:  LIPITOR  Take 10 mg by mouth daily.     calcium-vitamin D 500-200 MG-UNIT per tablet  Commonly known as:  OSCAL WITH D  Take 1 tablet by mouth 2 (two) times daily.     carbidopa-levodopa 25-100 MG per tablet  Commonly known as:  SINEMET IR  Take 1 tablet by mouth 2 (two) times daily.     Cranberry 250 MG Caps  Take 250 mg by mouth daily.     [place]  Take 1 Bottle by mouth 2 (two) times daily with a meal.     feeding supplement (PRO-STAT SUGAR FREE 64) Liqd  Take 30 mLs by mouth daily.     Fish Oil 1000 MG Caps  Take 1 capsule (1,000 mg total) by mouth daily.     guaiFENesin 600 MG 12 hr tablet  Commonly known as:  MUCINEX  Take 1 tablet (600 mg total) by mouth 2 (two) times daily.     hydroxyurea 500 MG capsule  Commonly known as:  HYDREA  Take 1,000 mg by mouth daily.     levothyroxine 150 MCG tablet  Commonly known as:  SYNTHROID, LEVOTHROID  Take 1 tablet (150 mcg total) by mouth daily.     losartan 50 MG tablet  Commonly known as:  COZAAR  Take 1.5 tablets (75 mg total) by mouth daily.     MULTI COMPLETE PO  Take 1 tablet by mouth daily.     pantoprazole 20 MG tablet  Commonly known as:  PROTONIX  Take 20 mg by mouth daily.     senna-docusate 8.6-50 MG per tablet  Commonly known as:  Senokot-S  Take 1 tablet by mouth daily.        Physical Exam  BP 144/88 mmHg  Pulse 82  Temp(Src) 98.2  F (36.8 C)  Resp 18  Ht 5' (1.524 m)  Wt 124 lb 4.8 oz (56.382 kg)  BMI 24.28 kg/m2  Constitutional: Thin elderly female in no acute distress.  HEENT: Normocephalic and atraumatic. PERRL. EOM intact. No icterus. Oral mucosa moist. Productive cough with frothy milky white secretions.  Neck: Supple and nontender. Bilateral submandibular lymphadenopathy noted. Nontender. No masses or thyromegaly. No JVD or carotid bruits. Cardiac: Normal S1, S2. Irregularly irregular. Distal pulses intact. Trace pitting edema of BLE Lungs: No respiratory distress. Breath sounds clear bilaterally without rales, rhonchi, or wheezes. Abdomen: Audible bowel sounds in all quadrants. Soft, nontender, nondistended.  Musculoskeletal: able to move all extremities Skin: Warm and dry. No rash noted. No erythema.  Neurological: Arousable.  Psychiatric: Appropriate mood and affect.   Labs Reviewed CBC Latest Ref Rng 09/07/2014 09/06/2014 09/05/2014  WBC 4.0 - 10.5 K/uL 4.9 5.2 4.8  Hemoglobin 12.0 - 15.0 g/dL 9.8(L) 10.7(L) 10.8(L)  Hematocrit 36.0 - 46.0 % 30.9(L) 33.0(L) 32.9(L)  Platelets 150 - 400 K/uL 757(H) 910(HH) 812(H)    CMP Latest Ref Rng 09/08/2014 09/07/2014 09/06/2014  Glucose 70 - 99 mg/dL 91 91 88  BUN 6 - 23 mg/dL 12 11 6   Creatinine 0.50 - 1.10 mg/dL 0.86 0.84 0.72  Sodium 135 - 145 mmol/L 135 140 139  Potassium 3.5 - 5.1 mmol/L 4.1 3.7 3.2(L)  Chloride 96 - 112 mEq/L 108 110 109  CO2 19 - 32 mmol/L 20 25 26   Calcium 8.4 - 10.5 mg/dL 8.1(L) 8.6 8.7  Total Protein 6.0 - 8.3 g/dL - - -  Albumin 3.5 - 4.7 g/dL - - -  Total Bilirubin 0.3 - 1.2 mg/dL - - -  Alkaline Phos 39 - 117 U/L - - -  AST 0 - 37 U/L - - -  ALT 0 - 35 U/L - - -    Lab Results  Component Value Date   HGBA1C 5.7* 01/21/2014   Lab Results  Component Value Date   TSH 6.808* 09/06/2014   Lab Results  Component Value Date   INR 1.19 09/09/2014   INR 1.23 09/08/2014   INR 1.20 09/07/2014   Lipid Panel  Component Value  Date/Time   CHOL 137 01/16/2014   TRIG 81 01/16/2014   LDLCALC 71 01/16/2014    Assessment & Plan 1. Upper respiratory infection with cough and congestion BS clear on exam bilaterally. Recent cxr 09/08/14 showed stable cardiomegaly and no active lung disease. Continue mucinex 600mg  twice daily and augmentin twice daily for another day. Encourage hydration. Continue aspiration precautions and monitor patient status.   Family/Staff Communication Plan of care discussed with nursing staff. Nursing staff verbalized understanding and agree with plan of care. No additional questions or concerns reported.     Arthur Holms, MSN, AGNP-C [facility] [address] [place],  [address] [phone] [8am-5pm] After hours: [phone]

## 2014-09-17 ENCOUNTER — Telehealth: Payer: Self-pay | Admitting: *Deleted

## 2014-09-17 NOTE — Telephone Encounter (Signed)
Call [facility] requesting patient records for appt on 09/18/14.

## 2014-09-18 ENCOUNTER — Telehealth: Payer: Self-pay | Admitting: Neurology

## 2014-09-18 ENCOUNTER — Ambulatory Visit (INDEPENDENT_AMBULATORY_CARE_PROVIDER_SITE_OTHER): Payer: Medicare Other | Admitting: Neurology

## 2014-09-18 ENCOUNTER — Ambulatory Visit: Payer: Medicare Other | Admitting: Neurology

## 2014-09-18 ENCOUNTER — Encounter: Payer: Self-pay | Admitting: Neurology

## 2014-09-18 VITALS — BP 136/83 | HR 101 | Temp 97.0°F | Ht 64.0 in

## 2014-09-18 DIAGNOSIS — F0390 Unspecified dementia without behavioral disturbance: Secondary | ICD-10-CM

## 2014-09-18 DIAGNOSIS — R54 Age-related physical debility: Secondary | ICD-10-CM

## 2014-09-18 DIAGNOSIS — K922 Gastrointestinal hemorrhage, unspecified: Secondary | ICD-10-CM

## 2014-09-18 DIAGNOSIS — F03C0 Unspecified dementia, severe, without behavioral disturbance, psychotic disturbance, mood disturbance, and anxiety: Secondary | ICD-10-CM

## 2014-09-18 DIAGNOSIS — N390 Urinary tract infection, site not specified: Secondary | ICD-10-CM

## 2014-09-18 NOTE — Telephone Encounter (Signed)
I called the daughter as requested. She has decided to go ahead and bring the patient in for her appointment today at 1 PM and we can talk then.

## 2014-09-18 NOTE — Patient Instructions (Signed)
We will not re-start her Aricept or Namenda at this time.  Monitor her cough and for fever. Try to push oral fluids and nutrition.  Follow up in April.

## 2014-09-18 NOTE — Telephone Encounter (Signed)
Pt's daughter would like for you to call her regarding pt just got out of the hospital and they took her off Aricept and Namenda because it was causing a extended heartbeat.  She has a terrible cold and does not feel comfortable bringing her in.  So she would like you to call her back regarding the change in her medication. She wants you to look at her records from the hospital and advise her. Please call and advise.

## 2014-09-18 NOTE — Progress Notes (Signed)
Subjective:    Patient ID: Tanya Farmer is a 81 y.o. female.  HPI     Interim history:   Tanya Farmer is an 81 year old right-handed woman with an underlying complex medical history of peripheral vascular disease, status post left femoropopliteal bypass and tibial thrombectomy, hypertension, hyperlipidemia, hypothyroidism, reflux disease, thrombocytosis, DVTs, recurrent UTI, dementia (on generic Aricept 10 mg for about 11 years, and recently placed on Namenda), who presents for followup consultation of her parkinsonism and dementia. She is accompanied by her daughter and son in law again today. I last saw her on 06/05/2014, at which time I did not make any medication changes. Her MMSE was 6 out of 30 at the time. In the interim, she was seen at [facility] Memory disorders clinic by Dr. Suanne Marker on 07/15/14 and I reviewed the progress note: Clinical impression was major neurocognitive disorder due to multiple etiologies, #2 Alzheimer's dementia, #3 arteriosclerotic dementia, without behavioral disturbance, recommendations was to decrease Aricept 5 mg to see if bladder control improves, if not, resume 10 mg per day. Consider trial of Sinemet 25-100 milligrams strength half a pill in the morning and afternoon for 1 week then 1 pill twice daily, scheduled toileting every 2-3 hours.  In the interim, she was admitted to the hospital from 08/31/2014 through 09/09/2014 secondary to lower GI bleed. Her Coumadin was held. She was also treated for UTI. She had altered mental status which was deemed secondary to urinary tract infection. She was noted to have prolonged QT and Aricept and Namenda were stopped as well. Palliative care was consulted. She was kept on Sinemet. She had a colonoscopy. She had a CXR.   Today, her daughter reports, that she has been more lethargic, she has a cough, no fevers.   I first met her on 01/21/2014 at the request of Dr. Bubba Camp, at which time the patient was noted to have  altered mental status and mild parkinsonism. Her exam was notable for poor attention, she was essentially nonverbal, she did not participate in memory testing, she did not follow commands and I was particularly concerned about her altered personality and altered mental status. I suggested further workup in the form of blood work, head CT and EEG. Blood work showed highly abnormal TSH, high platelet count, sodium elevated at 146. Her TSH was 34.6. She recently had a head CT on 05/09/2014: 1. No acute intracranial abnormality. 2. Similar appearance of advanced chronic microvascular ischemic white matter disease. 3. Ex vacuo dilatation likely secondary to central atrophy without significant interval change. 4. Intracranial atherosclerosis. In the interim, she was diagnosed with new A. Fib. On Her thyroid dysfunction has been treated. Most recently, her TSH on 05/09/2014 was in the normal range.  Today, her daughter reports that she has had recurrent UTIs and saw urology yesterday and was diagnosed with bacteremia and she is scheduled for US Kidneys in November. She is at [facility]. She has overall had fluctuation in her mental status. She is in therapy.  She was at [facility]) since January 2015 and has had ST/OT/PT. Prior to that, she has been living with her daughter and her family for the past 4 years and prior to that she lived alone. She has developed another UTI since her stay at SNF and is currently on the second week of ABx, per daughter, but I do not see an antibiotic on her MAR. She has been on Namenda 5 mg bid. She has been on coumadin.  She has a longstanding hx of memory loss, and there is no evidence of psychosis, hallucinations or delusions. She has fallen 2 times at the SNF, no injuries reported. She has been apathetic and minimally verbal, which is not typical for her. She has not been as outgoing, and not engaged. She was with her family yesterday, and the daughter states that she was  verbal and aware of her surroundings and somewhat interactive, not like she has today.  She had a brain MRI without contrast on 08/20/2013: There is moderate cerebral atrophy. Confluent periventricular T2 hyperintensity is compatible with advanced chronic small vessel ischemic disease. Small vessel changes are also noted in the pons. Small focus of susceptibility artifact in the posterior right temporal lobe may reflect a remote microhemorrhage or may be vascular. There is no other evidence for intracranial hemorrhage.  In addition, I personally reviewed the images through the PACS system.   Her Past Medical History Is Significant For: Past Medical History  Diagnosis Date  . Hypertension   . Hyperlipidemia   . Thyroid disease     Hypothyroidism  . GERD (gastroesophageal reflux disease)   . Arthritis     Degenerative  . Peripheral vascular disease     Left   Fem-Pop and  tibial Thrombectomy  . High platelet count   . Dementia   . Pain in toe of right foot   . Hypothyroidism   . Long term (current) use of anticoagulants 03/13/2014  . Neuromuscular disorder   . Parkinson's disease     Her Past Surgical History Is Significant For: Past Surgical History  Procedure Laterality Date  . Femoral-popliteal bypass graft  2002    Left  Dr. Sherren Mocha Early  . Abdominal hysterectomy    . Cataract surgery      left eye  . Colonoscopy N/A 09/06/2014    Procedure: COLONOSCOPY;  Surgeon: Jeryl Columbia, MD;  Location: [facility] ENDOSCOPY;  Service: Endoscopy;  Laterality: N/A;    Her Family History Is Significant For: Family History  Problem Relation Age of Onset  . Heart failure Mother   . Stroke Father     Her Social History Is Significant For: History   Social History  . Marital Status: Divorced    Spouse Name: N/A    Number of Children: N/A  . Years of Education: N/A   Social History Main Topics  . Smoking status: Former Research scientist (life sciences)  . Smokeless tobacco: Never Used  . Alcohol Use: No  . Drug Use:  No  . Sexual Activity: No   Other Topics Concern  . 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    Her Allergies Are:  No Known Allergies:   Her Current Medications Are:  Outpatient Encounter Prescriptions as of 09/18/2014  Medication Sig  . Amino Acids-Protein Hydrolys (FEEDING SUPPLEMENT, PRO-STAT SUGAR FREE 64,) LIQD Take 30 mLs by mouth daily.  Marland Kitchen apixaban (ELIQUIS) 2.5 MG TABS tablet Take 1 tablet (2.5 mg total) by mouth 2 (two) times daily.  Marland Kitchen atorvastatin (LIPITOR) 10 MG tablet Take 10 mg by mouth daily.  . calcium-vitamin D (OSCAL WITH D) 500-200 MG-UNIT per tablet Take 1 tablet by mouth 2 (two) times daily.  . carbidopa-levodopa (SINEMET IR) 25-100 MG per tablet Take 1 tablet by mouth 2 (two) times daily.  . Cranberry 250 MG CAPS Take 250 mg by mouth daily.  Marland Kitchen guaiFENesin (MUCINEX) 600 MG 12 hr tablet  Take 1 tablet (600 mg total) by mouth 2 (two) times daily.  . hydroxyurea (HYDREA) 500 MG capsule Take 1,000 mg by mouth daily.   Marland Kitchen levothyroxine (SYNTHROID, LEVOTHROID) 150 MCG tablet Take 1 tablet (150 mcg total) by mouth daily.  Marland Kitchen losartan (COZAAR) 50 MG tablet Take 1.5 tablets (75 mg total) by mouth daily.  . Multiple Vitamins-Minerals (MULTI COMPLETE PO) Take 1 tablet by mouth daily.  . Nutritional Supplements ([place]) LIQD Take 1 Bottle by mouth 2 (two) times daily with a meal.  . Omega-3 Fatty Acids (FISH OIL) 1000 MG CAPS Take 1 capsule (1,000 mg total) by mouth daily.  . pantoprazole (PROTONIX) 20 MG tablet Take 20 mg by mouth daily.  Marland Kitchen senna-docusate (SENOKOT-S) 8.6-50 MG per tablet Take 1 tablet by mouth daily.  :  Review of Systems:  Out of a complete 14 point review of systems, all are reviewed and negative with the exception of these symptoms as listed below:   Review of Systems  Respiratory: Positive for cough.     Objective:  Neurologic Exam  Physical  Exam Physical Examination:   Filed Vitals:   09/18/14 1248  BP: 136/83  Pulse: 101  Temp: 97 F (36.1 C)    General Examination: The patient is an elderly lady situated in her WC in no apparent distress. She is minimally verbal to non-verbal today. She is not able to follow simple verbal commands and not able to mimic much.   HEENT: Normocephalic, atraumatic, pupils are equal, round and reactive to light and accommodation. Funduscopic exam is not possible. Oropharynx is clear. She has moderate mouth dryness however. She has missing teeth. Neck is mild to moderately rigid. She has no lip, neck or jaw tremor. She has no significant facial masking. She has no one-sided facial asymmetry.   Chest: is clear to auscultation without wheezing, rhonchi or crackles noted.  Heart: sounds are regular and normal without murmurs, rubs or gallops noted.   Abdomen: is soft, non-tender and non-distended with normal bowel sounds appreciated on auscultation.  Extremities: There is trace pitting edema in the distal lower extremities bilaterally, distally.   Skin: is warm and dry with no trophic changes noted.   Musculoskeletal: exam reveals no obvious joint deformities, tenderness or joint swelling or erythema.   Neurologically:  Mental status: The patient is awake and alert, paying little attention. She is very minimally verbal. On 06/05/2014: MMSE is 6/30.  She is not lethargic. She moves all 4 extremities. Tone is mildly increased throughout with mild cogwheeling noted in both upper extremities. She has no resting tremor. She does not participate in fine motor testing or sensory or gait or cerebellar testing.  Assessment and Plan:   In summary, Tanya Farmer is a very pleasant 81 year old female with an underlying complex medical history of peripheral vascular disease, status post left femoropopliteal bypass and tibial thrombectomy, hypertension, hyperlipidemia, hypothyroidism, atrial fibrillation,  reflux disease, thrombocytosis, recurrent UTIs who presents for follow up consultation of her advanced dementia with an element of mild parkinsonism. She was recently admitted to the hospital for lower GI bleed and was again treated for UTI. Her Aricept and Namenda were stopped secondary to prolonged QT. At this juncture, I did not suggest restarting Aricept or Namenda. Unfortunately, her memory loss has been progressive and has progressed despite medication for memory. She most likely has a combination of Alzheimer's dementia and vascular dementia. Her parkinsonism is mild and not a big player at this  time. I suggested supportive care. She has had a cough but her lungs sound clear. She has had no fevers. She was seen at [facility] in the memory disorders clinic. I reviewed her recent hospital records as well as records from [facility]. She was referred to palliative care and I encouraged the patient's daughter to pursue transition to palliative care. I will see her back routinely in about 3 months, sooner if needed. I asked him to push for oral fluid intake and nutrition and keep her engaged mentally.  Most of my 40 minute visit today was spent in counseling and coordination of care, reviewing test results and reviewing medication and in the discussion of advanced dementia.

## 2014-10-08 ENCOUNTER — Non-Acute Institutional Stay (SKILLED_NURSING_FACILITY): Payer: Medicare Other | Admitting: Registered Nurse

## 2014-10-08 ENCOUNTER — Encounter: Payer: Self-pay | Admitting: Registered Nurse

## 2014-10-08 DIAGNOSIS — K5900 Constipation, unspecified: Secondary | ICD-10-CM

## 2014-10-08 DIAGNOSIS — K219 Gastro-esophageal reflux disease without esophagitis: Secondary | ICD-10-CM

## 2014-10-08 DIAGNOSIS — R1310 Dysphagia, unspecified: Secondary | ICD-10-CM

## 2014-10-08 DIAGNOSIS — G3183 Dementia with Lewy bodies: Secondary | ICD-10-CM

## 2014-10-08 DIAGNOSIS — D473 Essential (hemorrhagic) thrombocythemia: Secondary | ICD-10-CM

## 2014-10-08 DIAGNOSIS — E785 Hyperlipidemia, unspecified: Secondary | ICD-10-CM

## 2014-10-08 DIAGNOSIS — F0280 Dementia in other diseases classified elsewhere without behavioral disturbance: Secondary | ICD-10-CM

## 2014-10-08 DIAGNOSIS — E039 Hypothyroidism, unspecified: Secondary | ICD-10-CM

## 2014-10-08 DIAGNOSIS — I480 Paroxysmal atrial fibrillation: Secondary | ICD-10-CM

## 2014-10-08 DIAGNOSIS — I10 Essential (primary) hypertension: Secondary | ICD-10-CM

## 2014-10-08 NOTE — Progress Notes (Signed)
Patient ID: Tanya Farmer, female   DOB: [birth year], 81 y.o.   MRN: [id number]   Place of Service: [facility]  No Known Allergies  Code Status: Full Code  Goals of Care: Longevity/LTC  Chief Complaint  Patient presents with  . Medical Management of Chronic Issues    dementia, afib, htn, hypothyroidism, essential thrombocytosis, hld, GERD, constipation     HPI 81 y.o. female LTC resident with PMH of PAF on chronic anticoagulation with equilis, HTN, essential thrombocytosis, dementia with Parkinsonism, HLD among others is being seen for a routine visit for management of her chronic issues. Weight overall stable. No recent fall or skin concerns reported. No change in behavior or functional status reported. No concerns from staff. HTN stable with BP range 110-120s/60-70s. Currently off all dementia medications due to prolonged QTc. GERD stable on ppi. No issues with bleeding on eliquis for afib. HLD stable on statin. Seen in wheelchair today. Unable to obtain HPI and ROS  Review of Systems Unable to obtain due to dementia but appears in no distress  Past Medical History  Diagnosis Date  . Hypertension   . Hyperlipidemia   . Thyroid disease     Hypothyroidism  . GERD (gastroesophageal reflux disease)   . Arthritis     Degenerative  . Peripheral vascular disease     Left   Fem-Pop and  tibial Thrombectomy  . High platelet count   . Dementia   . Pain in toe of right foot   . Hypothyroidism   . Long term (current) use of anticoagulants 03/13/2014  . Neuromuscular disorder   . Parkinson's disease     Past Surgical History  Procedure Laterality Date  . Femoral-popliteal bypass graft  2002    Left  Dr. Sherren Mocha Early  . Abdominal hysterectomy    . Cataract surgery      left eye  . Colonoscopy N/A 09/06/2014    Procedure: COLONOSCOPY;  Surgeon: Jeryl Columbia, MD;  Location: [facility] ENDOSCOPY;  Service: Endoscopy;  Laterality: N/A;    History  Substance Use Topics  . Smoking  status: Former Research scientist (life sciences)  . Smokeless tobacco: Never Used  . Alcohol Use: No    Family History  Problem Relation Age of Onset  . Heart failure Mother   . Stroke Father       Medication List       This list is accurate as of: 10/08/14 10:11 PM.  Always use your most recent med list.               apixaban 2.5 MG Tabs tablet  Commonly known as:  ELIQUIS  Take 1 tablet (2.5 mg total) by mouth 2 (two) times daily.     atorvastatin 10 MG tablet  Commonly known as:  LIPITOR  Take 10 mg by mouth daily.     benzonatate 100 MG capsule  Commonly known as:  TESSALON  Take by mouth as needed for cough. Take every 8 hours as needed  ,do not crush or chew     calcium-vitamin D 500-200 MG-UNIT per tablet  Commonly known as:  OSCAL WITH D  Take 1 tablet by mouth 2 (two) times daily.     carbidopa-levodopa 25-100 MG per tablet  Commonly known as:  SINEMET IR  Take 1 tablet by mouth 2 (two) times daily.     Cranberry 250 MG Caps  Take 250 mg by mouth daily.     Fish Oil 1000 MG Caps  Take 1 capsule (1,000 mg total) by mouth daily.     hydroxyurea 500 MG capsule  Commonly known as:  HYDREA  Take 1,000 mg by mouth daily.     levothyroxine 150 MCG tablet  Commonly known as:  SYNTHROID, LEVOTHROID  Take 1 tablet (150 mcg total) by mouth daily.     losartan 50 MG tablet  Commonly known as:  COZAAR  Take 1.5 tablets (75 mg total) by mouth daily.     MULTI COMPLETE PO  Take 1 tablet by mouth daily.     pantoprazole 20 MG tablet  Commonly known as:  PROTONIX  Take 20 mg by mouth daily.     senna-docusate 8.6-50 MG per tablet  Commonly known as:  Senokot-S  Take 1 tablet by mouth daily.        Physical Exam  BP 127/66 mmHg  Pulse 64  Temp(Src) 98 F (36.7 C)  Resp 20  Ht 5' (1.524 m)  Wt 127 lb 3.2 oz (57.698 kg)  BMI 24.84 kg/m2  Constitutional: Thin elderly female in no acute distress. Sitting comfortable in chair.Mild hand tremors noted.  HEENT: Normocephalic  and atraumatic. PERRL. No scleral icterus. Oral mucosa moist.  Neck: No lymphadenopathy, masses, or thyromegaly. No JVD or carotid bruits. Cardiac: Normal S1, S2. Irregularly irregular. Distal pulses intact. Trace pitting edema of BLE Lungs: No respiratory distress. Breath sounds clear bilaterally without rales, rhonchi, or wheezes. Abdomen: Audible bowel sounds in all quadrants. Soft, nontender, nondistended.  Musculoskeletal: able to move all extremities. Generalized weakness.  Skin: Warm and dry. No rash noted.   Neurological: Alert  Psychiatric: Calm  Labs Reviewed  CBC Latest Ref Rng 09/10/2014 09/07/2014 09/06/2014  WBC - 6.5 4.9 5.2  Hemoglobin 12.0 - 16.0 g/dL 10.6(A) 9.8(L) 10.7(L)  Hematocrit 36 - 46 % 33(A) 30.9(L) 33.0(L)  Platelets 150 - 399 K/L 878(A) 757(H) 910(HH)    CMP Latest Ref Rng 09/08/2014 09/07/2014 09/06/2014  Glucose 70 - 99 mg/dL 91 91 88  BUN 6 - 23 mg/dL 12 11 6   Creatinine 0.50 - 1.10 mg/dL 0.86 0.84 0.72  Sodium 135 - 145 mmol/L 135 140 139  Potassium 3.5 - 5.1 mmol/L 4.1 3.7 3.2(L)  Chloride 96 - 112 mEq/L 108 110 109  CO2 19 - 32 mmol/L 20 25 26   Calcium 8.4 - 10.5 mg/dL 8.1(L) 8.6 8.7  Total Protein 6.0 - 8.3 g/dL - - -  Albumin 3.5 - 4.7 g/dL - - -  Total Bilirubin 0.3 - 1.2 mg/dL - - -  Alkaline Phos 39 - 117 U/L - - -  AST 0 - 37 U/L - - -  ALT 0 - 35 U/L - - -    Lab Results  Component Value Date   HGBA1C 5.7* 01/21/2014    Lab Results  Component Value Date   TSH 6.808* 09/06/2014    Lipid Panel     Component Value Date/Time   CHOL 137 01/16/2014   TRIG 81 01/16/2014   LDLCALC 71 01/16/2014   Assessment & Plan 1. Dementia with Parkinsonism Advanced with further declined anticipated. Continue Sinemet 25/100mg  two times daily. Continue to monitor for change in behaviors. Fall risk and pressure ulcer precautions  2. Paroxysmal atrial fibrillation Stable. Rate controlled. Continue Eliquis 2.5mg  two times daily. Monitor for sings of  bleeding  3. Essential hypertension, benign Stable. Continue losartan 75mg  daily   4. Essential thrombocytosis Continue hydroxyurea 1000mg  daily. Is followed by Dr. Fransisco Beau at [facility]  5. Dysphagia Stable. Continue pureed diet with thin liquids and feeding assistance. Patient is to sit up right in dining room as tolerated and nursing staff to check oral cavity for pocketing after meals. Continue aspiration precautions.   6. Gastroesophageal reflux disease, esophagitis presence not specified Stable. Continue protonix 20mg  daily.   7. Slow transit constipation Stable. Continue senna s 8.6/50mg  daily. Encourage hydration   8. HLD LDL at goal. Continue lipitor 10mg  daily  9. Hypothyroidism TSH elevated on recent lab. Will recheck TSH. Continue levothyroxine 16mcg daily for now.   Diagnostic Studies/Labs Ordered: tsh  Family/Staff Communication Plan of care discussed with nursing staff. Nursing staff verbalized understanding and agree with plan of care. No additional questions or concerns reported.    Arthur Holms, MSN, AGNP-C [facility] [address] [place], [place] [address] [phone] [8am-5pm] After hours: [phone]

## 2014-10-10 LAB — TSH: TSH: 10.79 u[IU]/mL — AB (ref 0.41–5.90)

## 2014-10-27 ENCOUNTER — Non-Acute Institutional Stay (SKILLED_NURSING_FACILITY): Payer: Medicare Other | Admitting: Registered Nurse

## 2014-10-27 ENCOUNTER — Encounter: Payer: Self-pay | Admitting: Registered Nurse

## 2014-10-27 DIAGNOSIS — M810 Age-related osteoporosis without current pathological fracture: Secondary | ICD-10-CM

## 2014-10-27 DIAGNOSIS — Z7189 Other specified counseling: Secondary | ICD-10-CM

## 2014-10-27 DIAGNOSIS — G3183 Dementia with Lewy bodies: Secondary | ICD-10-CM

## 2014-10-27 DIAGNOSIS — K219 Gastro-esophageal reflux disease without esophagitis: Secondary | ICD-10-CM

## 2014-10-27 DIAGNOSIS — F0280 Dementia in other diseases classified elsewhere without behavioral disturbance: Secondary | ICD-10-CM

## 2014-10-27 DIAGNOSIS — Z87440 Personal history of urinary (tract) infections: Secondary | ICD-10-CM

## 2014-10-27 NOTE — Progress Notes (Signed)
Patient ID: Tanya Farmer, female   DOB: October 23, 1925, 81 y.o.   MRN: [id number]   Place of Service: [facility]  No Known Allergies  Code Status: Full Code  Goals of Care: Longevity/LTC  Chief Complaint  Patient presents with  . Acute Visit    family meeting    HPI 81 y.o. female with PMH of PAF on chronic anticoagulation with equilis, HTN, essential thrombocytosis, dementia with Parkinsonism, HLD among others is being seen for an acute visit at the request for daughter, Tanya Farmer and son-in-law, Tanya Farmer for a meeting to discuss about her goal of care and any concerns they may have. Dr. Bubba Farmer, Tanya Farmer from Social work, Tanya Farmer from [facility], Tanya Farmer, Tanya Farmer, and I were present at the meeting. The meeting took over 90 minutes. In summary, Mrs. Tanya Farmer goal for her mother is longevity and to be able to walk again like she was about year ago. Several concerns were brought up in the meeting from Mr. & Tanya Farmer including: 1) Discontinuation the Sinemet stating that it was intended for a 2-weeks trial only and never meant for long-term use. 2) Starting date of Namenda, as she was not aware that her mother was on Namenda until her recent hospitalization. 3) Discontinuation of protinix. 4) UTI prophylaxis including meticulous peri care and encouraging fluid intake. 5) Update about her mother's status at defined interval.  Review of Systems Unable to obtain due to dementia but appears in no distress  Past Medical History  Diagnosis Date  . Hypertension   . Hyperlipidemia   . Thyroid disease     Hypothyroidism  . GERD (gastroesophageal reflux disease)   . Arthritis     Degenerative  . Peripheral vascular disease     Left   Fem-Pop and  tibial Thrombectomy  . High platelet count   . Dementia   . Pain in toe of right foot   . Hypothyroidism   . Long term (current) use of anticoagulants 03/13/2014  . Neuromuscular disorder   . Parkinson's disease     Past  Surgical History  Procedure Laterality Date  . Femoral-popliteal bypass graft  2002    Left  Dr. Sherren Farmer Early  . Abdominal hysterectomy    . Cataract surgery      left eye  . Colonoscopy N/A 09/06/2014    Procedure: COLONOSCOPY;  Surgeon: Tanya Columbia, MD;  Location: [facility] ENDOSCOPY;  Service: Endoscopy;  Laterality: N/A;    History  Substance Use Topics  . Smoking status: Former Research scientist (life sciences)  . Smokeless tobacco: Never Used  . Alcohol Use: No    Family History  Problem Relation Age of Onset  . Heart failure Mother   . Stroke Father       Medication List       This list is accurate as of: 10/27/14  5:17 PM.  Always use your most recent med list.               apixaban 2.5 MG Tabs tablet  Commonly known as:  ELIQUIS  Take 1 tablet (2.5 mg total) by mouth 2 (two) times daily.     atorvastatin 10 MG tablet  Commonly known as:  LIPITOR  Take 10 mg by mouth daily.     benzonatate 100 MG capsule  Commonly known as:  TESSALON  Take by mouth as needed for cough. Take every 8 hours as needed  ,do not crush or chew     calcium-vitamin D  500-200 MG-UNIT per tablet  Commonly known as:  OSCAL WITH D  Take 1 tablet by mouth 2 (two) times daily.     carbidopa-levodopa 25-100 MG per tablet  Commonly known as:  SINEMET IR  Take 1 tablet by mouth 2 (two) times daily.     Cranberry 250 MG Caps  Take 250 mg by mouth daily.     Fish Oil 1000 MG Caps  Take 1 capsule (1,000 mg total) by mouth daily.     hydroxyurea 500 MG capsule  Commonly known as:  HYDREA  Take 1,000 mg by mouth daily.     levothyroxine 150 MCG tablet  Commonly known as:  SYNTHROID, LEVOTHROID  Take 1 tablet (150 mcg total) by mouth daily.     losartan 50 MG tablet  Commonly known as:  COZAAR  Take 1.5 tablets (75 mg total) by mouth daily.     MULTI COMPLETE PO  Take 1 tablet by mouth daily.     pantoprazole 20 MG tablet  Commonly known as:  PROTONIX  Take 20 mg by mouth daily.     senna-docusate 8.6-50 MG  per tablet  Commonly known as:  Senokot-S  Take 1 tablet by mouth daily.        Physical Exam  BP 122/70 mmHg  Pulse 70  Temp(Src) 98 F (36.7 C)  Resp 18  Ht 5' (1.524 m)  Wt 127 lb 3.2 oz (57.698 kg)  BMI 24.84 kg/m2  Constitutional: Thin elderly female in no acute distress. Sitting comfortable in chair. HEENT: Normocephalic and atraumatic. PERRL. No scleral icterus. Oral mucosa moist.  Neck: No lymphadenopathy, masses, or thyromegaly. No JVD or carotid bruits. Cardiac: Normal S1, S2. Irregularly irregular. Distal pulses intact. Trace pitting edema of BLE Lungs: No respiratory distress. Breath sounds clear bilaterally without rales, rhonchi, or wheezes. Abdomen: Audible bowel sounds in all quadrants. Soft, nontender, nondistended.  Musculoskeletal: able to move all extremities. Generalized weakness.  Skin: Warm and dry. No rash noted.   Neurological: Alert  Psychiatric: Calm  Labs Reviewed  CBC Latest Ref Rng 09/10/2014 09/07/2014 09/06/2014  WBC - 6.5 4.9 5.2  Hemoglobin 12.0 - 16.0 g/dL 10.6(A) 9.8(L) 10.7(L)  Hematocrit 36 - 46 % 33(A) 30.9(L) 33.0(L)  Platelets 150 - 399 K/L 878(A) 757(H) 910(HH)    CMP Latest Ref Rng 09/08/2014 09/07/2014 09/06/2014  Glucose 70 - 99 mg/dL 91 91 88  BUN 6 - 23 mg/dL 12 11 6   Creatinine 0.50 - 1.10 mg/dL 0.86 0.84 0.72  Sodium 135 - 145 mmol/L 135 140 139  Potassium 3.5 - 5.1 mmol/L 4.1 3.7 3.2(L)  Chloride 96 - 112 mEq/L 108 110 109  CO2 19 - 32 mmol/L 20 25 26   Calcium 8.4 - 10.5 mg/dL 8.1(L) 8.6 8.7  Total Protein 6.0 - 8.3 g/dL - - -  Albumin 3.5 - 4.7 g/dL - - -  Total Bilirubin 0.3 - 1.2 mg/dL - - -  Alkaline Phos 39 - 117 U/L - - -  AST 0 - 37 U/L - - -  ALT 0 - 35 U/L - - -    Lab Results  Component Value Date   HGBA1C 5.7* 01/21/2014    Lab Results  Component Value Date   TSH 6.808* 09/06/2014    Lipid Panel     Component Value Date/Time   CHOL 137 01/16/2014   TRIG 81 01/16/2014   LDLCALC 71 01/16/2014     Assessment & Plan 1. Goals of care, counseling/discussion We  spent over an 90 minutes with the family discussing about [name]'s medical issues and answering questions from the family. Dr. Bubba Farmer explained to [name] that her mother dementia is severe based on her most recent MMSE score of 6/30. Further declined is anticipated. Palliative care was recommended during her recent hospitalization and neurology appointment. Mr and [name] agree but do not want to implement palliative consult at this time.   2. Dementia with Parkinsonism Advanced. Will not restart Namenda or Aricept as risks outweigh benefits. Family notified of Namenda start date: 01/07/14. Will taper off Sinemet: 1/2 tabs twice daily over 1 week then discontinue per family request. Continue to monitor her status  3. Gastroesophageal reflux disease, esophagitis presence not specified Continue protonix 20mg  daily as patient has hx or recent GI bleed. Will reassess and discontinue as indicated.   4. Osteoporosis T-score -4.4 on recent DEXA on 10/24/14. Talked about fosamax. Family would like more time to think about it. Continue oscal with vit D 500/200 units twice daily for now. Fall risk precautions.   5. History of recurrent UTIs Continue cranberry 250mg  daily. Encourage fluid intake and continue to skin care per protocol. Continue to monitor her status.   Family/Staff Communication Plan of care discussed with family and nursing staff. Family and nursing staff verbalized understanding and agree with plan of care. No additional questions or concerns reported.    Arthur Holms, MSN, AGNP-C [facility] [address] [place], [place] [address] [phone] [8am-5pm] After hours: [phone]

## 2014-11-10 ENCOUNTER — Non-Acute Institutional Stay (SKILLED_NURSING_FACILITY): Payer: Medicare Other | Admitting: Registered Nurse

## 2014-11-10 DIAGNOSIS — K219 Gastro-esophageal reflux disease without esophagitis: Secondary | ICD-10-CM | POA: Diagnosis not present

## 2014-11-10 DIAGNOSIS — E039 Hypothyroidism, unspecified: Secondary | ICD-10-CM

## 2014-11-10 DIAGNOSIS — Z87440 Personal history of urinary (tract) infections: Secondary | ICD-10-CM | POA: Diagnosis not present

## 2014-11-10 DIAGNOSIS — M810 Age-related osteoporosis without current pathological fracture: Secondary | ICD-10-CM | POA: Diagnosis not present

## 2014-11-10 DIAGNOSIS — K5900 Constipation, unspecified: Secondary | ICD-10-CM | POA: Diagnosis not present

## 2014-11-10 DIAGNOSIS — E785 Hyperlipidemia, unspecified: Secondary | ICD-10-CM

## 2014-11-10 DIAGNOSIS — F0390 Unspecified dementia without behavioral disturbance: Secondary | ICD-10-CM | POA: Diagnosis not present

## 2014-11-10 DIAGNOSIS — R1310 Dysphagia, unspecified: Secondary | ICD-10-CM

## 2014-11-10 DIAGNOSIS — F03C0 Unspecified dementia, severe, without behavioral disturbance, psychotic disturbance, mood disturbance, and anxiety: Secondary | ICD-10-CM

## 2014-11-10 DIAGNOSIS — I10 Essential (primary) hypertension: Secondary | ICD-10-CM

## 2014-11-10 DIAGNOSIS — I480 Paroxysmal atrial fibrillation: Secondary | ICD-10-CM

## 2014-11-10 DIAGNOSIS — D473 Essential (hemorrhagic) thrombocythemia: Secondary | ICD-10-CM

## 2014-11-12 ENCOUNTER — Encounter: Payer: Self-pay | Admitting: Registered Nurse

## 2014-11-12 NOTE — Progress Notes (Addendum)
Patient ID: Tanya Farmer, female   DOB: 03/22/1926, 81 y.o.   MRN: [id number]   Place of Service: [facility]  No Known Allergies  Code Status: Full Code  Goals of Care: Longevity/LTC  Chief Complaint  Patient presents with  . Medical Management of Chronic Issues    dementia, constipation, hypothyroidism, osteoporosis, HTN, essential thrombocytosis, afib    HPI 81 y.o. female LTC resident with PMH of PAF on chronic anticoagulation with equilis, HTN, essential thrombocytosis, severe dementia, HLD among others is being seen for a routine visit for management of her chronic issues. Weight stable over the past 30 days. No recent fall or skin concerns reported. No change in behavior or functional status reported. No concerns from staff. BP adequately controlled with losartan, with a few elevated readings. Currently off all dementia medications due to prolonged QTc. GERD stable on ppi. HLD stable on statin. No issues with constipation. No signs of bleeding with Eliquis for afib. Seen in wheelchair today. Unable to obtain HPI and ROS  Review of Systems Unable to obtain due to dementia but appears in no distress  Past Medical History  Diagnosis Date  . Hypertension   . Hyperlipidemia   . Thyroid disease     Hypothyroidism  . GERD (gastroesophageal reflux disease)   . Arthritis     Degenerative  . Peripheral vascular disease     Left   Fem-Pop and  tibial Thrombectomy  . High platelet count   . Dementia   . Pain in toe of right foot   . Hypothyroidism   . Long term (current) use of anticoagulants 03/13/2014  . Neuromuscular disorder   . Parkinson's disease     Past Surgical History  Procedure Laterality Date  . Femoral-popliteal bypass graft  2002    Left  Dr. Sherren Mocha Early  . Abdominal hysterectomy    . Cataract surgery      left eye  . Colonoscopy N/A 09/06/2014    Procedure: COLONOSCOPY;  Surgeon: Jeryl Columbia, MD;  Location: [facility] ENDOSCOPY;  Service: Endoscopy;   Laterality: N/A;    History  Substance Use Topics  . Smoking status: Former Research scientist (life sciences)  . Smokeless tobacco: Never Used  . Alcohol Use: No    Family History  Problem Relation Age of Onset  . Heart failure Mother   . Stroke Father       Medication List       This list is accurate as of: 11/10/14 11:59 PM.  Always use your most recent med list.               apixaban 2.5 MG Tabs tablet  Commonly known as:  ELIQUIS  Take 1 tablet (2.5 mg total) by mouth 2 (two) times daily.     atorvastatin 10 MG tablet  Commonly known as:  LIPITOR  Take 10 mg by mouth daily.     benzonatate 100 MG capsule  Commonly known as:  TESSALON  Take by mouth as needed for cough. Take every 8 hours as needed  ,do not crush or chew     calcium-vitamin D 500-200 MG-UNIT per tablet  Commonly known as:  OSCAL WITH D  Take 1 tablet by mouth 2 (two) times daily.     Cranberry 250 MG Caps  Take 250 mg by mouth daily.     Fish Oil 1000 MG Caps  Take 1 capsule (1,000 mg total) by mouth daily.     hydroxyurea 500 MG capsule  Commonly known as:  HYDREA  Take 1,000 mg by mouth daily.     levothyroxine 200 MCG tablet  Commonly known as:  SYNTHROID, LEVOTHROID  Take 200 mcg by mouth daily before breakfast.     losartan 50 MG tablet  Commonly known as:  COZAAR  Take 1.5 tablets (75 mg total) by mouth daily.     MULTI COMPLETE PO  Take 1 tablet by mouth daily.     pantoprazole 20 MG tablet  Commonly known as:  PROTONIX  Take 20 mg by mouth daily.     senna-docusate 8.6-50 MG per tablet  Commonly known as:  Senokot-S  Take 1 tablet by mouth daily.     sodium chloride 0.65 % Soln nasal spray  Commonly known as:  OCEAN  Place 1 spray into both nostrils as needed for congestion.        Physical Exam  BP 140/80 mmHg  Pulse 91  Temp(Src) 97.1 F (36.2 C)  Resp 18  Ht 5' (1.524 m)  Wt 127 lb 6.4 oz (57.788 kg)  BMI 24.88 kg/m2  Constitutional: Thin elderly female in no acute distress.  Sitting comfortable in chair. HEENT: Normocephalic and atraumatic. PERRL. No scleral icterus. Oral mucosa moist.  Neck: No lymphadenopathy, masses, or thyromegaly. No JVD or carotid bruits. Cardiac: Normal S1, S2. Irregularly irregular. Distal pulses intact. Trace pitting edema of BLE Lungs: No respiratory distress. Breath sounds clear bilaterally without rales, rhonchi, or wheezes. Abdomen: Audible bowel sounds in all quadrants. Soft, nontender, nondistended.  Musculoskeletal: able to move all extremities. Generalized weakness with limited ROM throughout.  Skin: Warm and dry. No rash noted.   Neurological: Alert  Psychiatric: flat affect. Has eyes closed most of the time.   Labs Reviewed  CBC Latest Ref Rng 09/10/2014 09/07/2014 09/06/2014  WBC - 6.5 4.9 5.2  Hemoglobin 12.0 - 16.0 g/dL 10.6(A) 9.8(L) 10.7(L)  Hematocrit 36 - 46 % 33(A) 30.9(L) 33.0(L)  Platelets 150 - 399 K/L 878(A) 757(H) 910(HH)    CMP Latest Ref Rng 09/08/2014 09/07/2014 09/06/2014  Glucose 70 - 99 mg/dL 91 91 88  BUN 6 - 23 mg/dL 12 11 6   Creatinine 0.50 - 1.10 mg/dL 0.86 0.84 0.72  Sodium 135 - 145 mmol/L 135 140 139  Potassium 3.5 - 5.1 mmol/L 4.1 3.7 3.2(L)  Chloride 96 - 112 mEq/L 108 110 109  CO2 19 - 32 mmol/L 20 25 26   Calcium 8.4 - 10.5 mg/dL 8.1(L) 8.6 8.7  Total Protein 6.0 - 8.3 g/dL - - -  Albumin 3.5 - 4.7 g/dL - - -  Total Bilirubin 0.3 - 1.2 mg/dL - - -  Alkaline Phos 39 - 117 U/L - - -  AST 0 - 37 U/L - - -  ALT 0 - 35 U/L - - -    Lab Results  Component Value Date   HGBA1C 5.7* 01/21/2014    Lab Results  Component Value Date   TSH 10.79* 10/10/2014    Lipid Panel     Component Value Date/Time   CHOL 137 01/16/2014   TRIG 81 01/16/2014   LDLCALC 71 01/16/2014   Assessment & Plan 1. Dementia  Advanced with further declined anticipated. Continue to monitor for change in behaviors. Fall risk and pressure ulcer precautions  2. Paroxysmal atrial fibrillation Stable. Rate  controlled. Continue Eliquis 2.5mg  two times daily. Monitor for signs of bleeding  3. Essential hypertension, benign Stable. Some elevated readings. Continue losartan 75mg  daily for now and monitor. Monitor  BP and HR daily. Will make adjustment as needed.   4. Essential thrombocytosis Is followed by Dr. Fransisco Beau at [facility]. Continue hydroxyurea 1000mg  daily and monitor  5. Dysphagia Stable. Continue pureed diet with thin liquids and feeding assistance. Patient is to sit up right in dining room as tolerated and nursing staff to check oral cavity for pocketing after meals. Continue aspiration precautions.   6. Gastroesophageal reflux disease, esophagitis presence not specified Stable. No signs of recurrence GI bleed. Continue protonix 20mg  daily for now. Continue to monitor  7. Slow transit constipation Stable. Continue senna s 8.6/50mg  daily. Encourage hydration   8. HLD LDL at goal. Continue lipitor 10mg  daily  9. Hypothyroidism TSH 10.794. Levothyroxine was recently increased to 264mcg daily. Continue to monitor for now.   10. Hx Recurrent UTI No recent UTI. Continue cranberry 250mg  daily for uti prophylaxis. Encourage hydration. Continue peri care.   11. Osteoporosis Continue oscal-D 500/200 twice daily. Pending treatment per daughter request as she would like to research more about medications for osteoporosis   Family/Staff Communication Plan of care discussed with nursing staff. Nursing staff verbalized understanding and agree with plan of care. No additional questions or concerns reported.    Arthur Holms, MSN, AGNP-C [facility] [address] [place], [place] [address] [phone] [8am-5pm] After hours: [phone]

## 2014-11-14 ENCOUNTER — Non-Acute Institutional Stay (SKILLED_NURSING_FACILITY): Payer: Medicare Other | Admitting: Registered Nurse

## 2014-11-14 DIAGNOSIS — R400 Somnolence: Secondary | ICD-10-CM | POA: Diagnosis not present

## 2014-11-14 LAB — CBC AND DIFFERENTIAL
HCT: 42 % (ref 36–46)
Hemoglobin: 13.6 g/dL (ref 12.0–16.0)
Platelets: 900 10*3/uL — AB (ref 150–399)
WBC: 8 10*3/mL

## 2014-11-14 LAB — BASIC METABOLIC PANEL
BUN: 12 mg/dL (ref 4–21)
CREATININE: 0.5 mg/dL (ref 0.5–1.1)
Glucose: 143 mg/dL
POTASSIUM: 4.4 mmol/L (ref 3.4–5.3)
SODIUM: 137 mmol/L (ref 137–147)

## 2014-11-14 NOTE — Progress Notes (Signed)
Patient ID: Tanya Farmer, female   DOB: Feb 06, 1926, 81 y.o.   MRN: [id number]   Place of Service: [facility]  No Known Allergies  Code Status: Full Code  Goals of Care: Longevity/LTC  Chief Complaint  Patient presents with  . Acute Visit    AMS    HPI 81 y.o. female with PMH of PAF on chronic anticoagulation with equilis, HTN, essential thrombocytosis, dementia with Parkinsonism, HLD among others is being seen for an acute visit at the request for daughter, Lance Sell for the evaluation of altered mental status. Daughter is very concerned about dehydration and UTI after speaking to PT. Per PT, patient was unable to participate in therapy like she did a couple days ago. PT also noticed that patient has been more somnolence and pocketing food/fluid in her mouth again.  Per family, patient had pocketed her food/fluid in the past, but has stopped for a while now. See in wheelchair. Unable to participate in hpi and ros.   Review of Systems Unable to obtain due to dementia but appears in no distress  Past Medical History  Diagnosis Date  . Hypertension   . Hyperlipidemia   . Thyroid disease     Hypothyroidism  . GERD (gastroesophageal reflux disease)   . Arthritis     Degenerative  . Peripheral vascular disease     Left   Fem-Pop and  tibial Thrombectomy  . High platelet count   . Dementia   . Pain in toe of right foot   . Hypothyroidism   . Long term (current) use of anticoagulants 03/13/2014  . Neuromuscular disorder   . Parkinson's disease     Past Surgical History  Procedure Laterality Date  . Femoral-popliteal bypass graft  2002    Left  Dr. Sherren Mocha Early  . Abdominal hysterectomy    . Cataract surgery      left eye  . Colonoscopy N/A 09/06/2014    Procedure: COLONOSCOPY;  Surgeon: Jeryl Columbia, MD;  Location: [facility] ENDOSCOPY;  Service: Endoscopy;  Laterality: N/A;    History  Substance Use Topics  . Smoking status: Former Research scientist (life sciences)  . Smokeless tobacco:  Never Used  . Alcohol Use: No    Family History  Problem Relation Age of Onset  . Heart failure Mother   . Stroke Father       Medication List       This list is accurate as of: 11/14/14  2:43 PM.  Always use your most recent med list.               apixaban 2.5 MG Tabs tablet  Commonly known as:  ELIQUIS  Take 1 tablet (2.5 mg total) by mouth 2 (two) times daily.     atorvastatin 10 MG tablet  Commonly known as:  LIPITOR  Take 10 mg by mouth daily.     benzonatate 100 MG capsule  Commonly known as:  TESSALON  Take by mouth as needed for cough. Take every 8 hours as needed  ,do not crush or chew     calcium-vitamin D 500-200 MG-UNIT per tablet  Commonly known as:  OSCAL WITH D  Take 1 tablet by mouth 2 (two) times daily.     Cranberry 250 MG Caps  Take 250 mg by mouth daily.     Fish Oil 1000 MG Caps  Take 1 capsule (1,000 mg total) by mouth daily.     hydroxyurea 500 MG capsule  Commonly known as:  HYDREA  Take 1,000 mg by mouth daily.     levothyroxine 200 MCG tablet  Commonly known as:  SYNTHROID, LEVOTHROID  Take 200 mcg by mouth daily before breakfast.     losartan 50 MG tablet  Commonly known as:  COZAAR  Take 1.5 tablets (75 mg total) by mouth daily.     MULTI COMPLETE PO  Take 1 tablet by mouth daily.     pantoprazole 20 MG tablet  Commonly known as:  PROTONIX  Take 20 mg by mouth daily.     senna-docusate 8.6-50 MG per tablet  Commonly known as:  Senokot-S  Take 1 tablet by mouth daily.     sodium chloride 0.65 % Soln nasal spray  Commonly known as:  OCEAN  Place 1 spray into both nostrils as needed for congestion.        Physical Exam  BP 145/80 mmHg  Pulse 62  Temp(Src) 98.2 F (36.8 C)  Resp 18  SpO2 96%  Constitutional: Thin elderly female in no acute distress. Sitting comfortable in chair.  HEENT: Normocephalic and atraumatic. PERRL. No scleral icterus. Oral mucosa moist.  Neck: No lymphadenopathy, masses, or thyromegaly.  No JVD or carotid bruits. Cardiac: Normal S1, S2. Irregularly irregular. Distal pulses intact. Trace pitting edema of BLE Lungs: No respiratory distress. Breath sounds clear bilaterally without rales, rhonchi, or wheezes. Abdomen: Audible bowel sounds in all quadrants. Soft, nontender, nondistended.  Musculoskeletal: able to move all extremities. Generalized weakness.  Skin: Warm and dry. No rash noted.   Neurological: Arousable Psychiatric: looks sleepy  Labs Reviewed  CBC Latest Ref Rng 09/10/2014 09/07/2014 09/06/2014  WBC - 6.5 4.9 5.2  Hemoglobin 12.0 - 16.0 g/dL 10.6(A) 9.8(L) 10.7(L)  Hematocrit 36 - 46 % 33(A) 30.9(L) 33.0(L)  Platelets 150 - 399 K/L 878(A) 757(H) 910(HH)    CMP Latest Ref Rng 09/08/2014 09/07/2014 09/06/2014  Glucose 70 - 99 mg/dL 91 91 88  BUN 6 - 23 mg/dL 12 11 6   Creatinine 0.50 - 1.10 mg/dL 0.86 0.84 0.72  Sodium 135 - 145 mmol/L 135 140 139  Potassium 3.5 - 5.1 mmol/L 4.1 3.7 3.2(L)  Chloride 96 - 112 mEq/L 108 110 109  CO2 19 - 32 mmol/L 20 25 26   Calcium 8.4 - 10.5 mg/dL 8.1(L) 8.6 8.7  Total Protein 6.0 - 8.3 g/dL - - -  Albumin 3.5 - 4.7 g/dL - - -  Total Bilirubin 0.3 - 1.2 mg/dL - - -  Alkaline Phos 39 - 117 U/L - - -  AST 0 - 37 U/L - - -  ALT 0 - 35 U/L - - -    Lab Results  Component Value Date   HGBA1C 5.7* 01/21/2014    Lab Results  Component Value Date   TSH 10.79* 10/10/2014    Lipid Panel     Component Value Date/Time   CHOL 137 01/16/2014   TRIG 81 01/16/2014   LDLCALC 71 01/16/2014   Assessment & Plan 1. Somnolence Family would like to send resident to the ER for further evaluation. However, I explained to them that ER visit is not clinically indicated at this time as her VS stable, she appears in no acute distress, and there is no significant change from her baseline behavior from my perspective. She was recently seen for a routine visit 2 days ago. I told them since she appears to be stable, I think it is best that she  stay here. Will check stat cbc w/ diff and  cmp for further evaluation. Family agrees but would like for staff to send her out to ER if her labs indicate dehydration, worsening of mental status, or unstable VS. Reassure family that we will send resident to ER when clinically necessary and nursing will inform them of any status changes. Continue to monitor for now.   Family/Staff Communication Plan of care discussed with family and nursing staff. Family and nursing staff verbalized understanding and agree with plan of care. No additional questions or concerns reported.    Arthur Holms, MSN, AGNP-C [facility] [address] [place], [place] [address] [phone] [8am-5pm] After hours: [phone]

## 2014-11-19 ENCOUNTER — Ambulatory Visit: Payer: Medicare Other | Admitting: Neurology

## 2014-11-21 LAB — TSH: TSH: 7.23 u[IU]/mL — AB (ref <=5.90)

## 2014-12-08 ENCOUNTER — Encounter: Payer: Self-pay | Admitting: Neurology

## 2014-12-08 ENCOUNTER — Ambulatory Visit (INDEPENDENT_AMBULATORY_CARE_PROVIDER_SITE_OTHER): Payer: Medicare Other | Admitting: Neurology

## 2014-12-08 VITALS — BP 130/78 | HR 68 | Resp 12

## 2014-12-08 DIAGNOSIS — F0390 Unspecified dementia without behavioral disturbance: Secondary | ICD-10-CM | POA: Diagnosis not present

## 2014-12-08 DIAGNOSIS — R54 Age-related physical debility: Secondary | ICD-10-CM

## 2014-12-08 DIAGNOSIS — G20 Parkinson's disease: Secondary | ICD-10-CM | POA: Diagnosis not present

## 2014-12-08 DIAGNOSIS — N390 Urinary tract infection, site not specified: Secondary | ICD-10-CM | POA: Diagnosis not present

## 2014-12-08 DIAGNOSIS — F03C0 Unspecified dementia, severe, without behavioral disturbance, psychotic disturbance, mood disturbance, and anxiety: Secondary | ICD-10-CM

## 2014-12-08 NOTE — Patient Instructions (Signed)
I would recommend that you continue with the current management.   I would not recommend any new medications.

## 2014-12-08 NOTE — Progress Notes (Signed)
Subjective:    Patient ID: Tanya Farmer is a 81 y.o. female.  HPI     Interim history:  Tanya Farmer is an 81 year old right-handed woman with an underlying complex medical history of peripheral vascular disease, status post left femoropopliteal bypass and tibial thrombectomy, hypertension, hyperlipidemia, hypothyroidism, reflux disease, thrombocytosis, DVTs, recurrent UTI, dementia (on generic Aricept 10 mg for about 11 years, and recently placed on Namenda), who presents for followup consultation of her advanced dementia. She is accompanied by her daughter and son in law again today. I last saw her on 09/18/2014, at which time the daughter reported that she was more lethargic. She had a cough. She had been advised by an outside neurologist to pursue palliative care. I encouraged her to pursue palliative care for her mother.  Today, 12/08/2014: she is residing at [facility] [place]. She was seen by the nursing home provider on 11/14/14 d/t AMS reported. I reviewed those records. Her daughter is giving the history and the patient is not providing the history. She still has high platelets, on 1/131/6: plts were 878. She is no longer on Sinemet. She is getter PT daily. She is responding to therapy. They called for palliative care referral, but it did not come to fruition.   She had a brain MRI on 08/31/14: 1. No acute intracranial infarct or other abnormality identified. 2. Advanced cerebral atrophy with chronic microvascular ischemic disease, grossly stable from prior.   I personally reviewed the images through the PACS system.   Previously:  I saw her on 06/05/2014, at which time I did not make any medication changes. Her MMSE was 6 out of 30 at the time. In the interim, she was seen at [facility] by Dr. Suanne Farmer on 07/15/14 and I reviewed the progress note: Clinical impression was major neurocognitive disorder due to multiple etiologies, #2 Alzheimer's  dementia, #3 arteriosclerotic dementia, without behavioral disturbance, recommendations was to decrease Aricept 5 mg to see if bladder control improves, if not, resume 10 mg per day. Consider trial of Sinemet 25-100 milligrams strength half a pill in the morning and afternoon for 1 week then 1 pill twice daily, scheduled toileting every 2-3 hours.   In the interim, she was admitted to the hospital from 08/31/2014 through 09/09/2014 secondary to lower GI bleed. Her Coumadin was held. She was also treated for UTI. She had altered mental status which was deemed secondary to urinary tract infection. She was noted to have prolonged QT and Aricept and Namenda were stopped as well. Palliative care was consulted. She was kept on Sinemet. She had a colonoscopy. She had a CXR.   I first met her on 01/21/2014 at the request of Dr. Bubba Farmer, at which time the patient was noted to have altered mental status and mild parkinsonism. Her exam was notable for poor attention, she was essentially nonverbal, she did not participate in memory testing, she did not follow commands and I was particularly concerned about her altered personality and altered mental status. I suggested further workup in the form of blood work, head CT and EEG. Blood work showed highly abnormal TSH, high platelet count, sodium elevated at 146. Her TSH was 34.6. She recently had a head CT on 05/09/2014: 1. No acute intracranial abnormality. 2. Similar appearance of advanced chronic microvascular ischemic white matter disease. 3. Ex vacuo dilatation likely secondary to central atrophy without significant interval change. 4. Intracranial atherosclerosis. In the interim, she was diagnosed with new  A. Fib. On Her thyroid dysfunction has been treated. Most recently, her TSH on 05/09/2014 was in the normal range.     She was at [facility]) since January 2015 and has had ST/OT/PT. Prior to that, she has been living with her daughter and her family for the past  4 years and prior to that she lived alone. She has developed another UTI since her stay at SNF and is currently on the second week of ABx, per daughter, but I do not see an antibiotic on her MAR. She has been on Namenda 5 mg bid. She has been on coumadin.   She has a longstanding hx of memory loss, and there is no evidence of psychosis, hallucinations or delusions. She has fallen 2 times at the SNF, no injuries reported. She has been apathetic and minimally verbal, which is not typical for her. She has not been as outgoing, and not engaged. She was with her family yesterday, and the daughter states that she was verbal and aware of her surroundings and somewhat interactive, not like she has today.   She had a brain MRI without contrast on 08/20/2013: There is moderate cerebral atrophy. Confluent periventricular T2 hyperintensity is compatible with advanced chronic small vessel ischemic disease. Small vessel changes are also noted in the pons. Small focus of susceptibility artifact in the posterior right temporal lobe may reflect a remote microhemorrhage or may be vascular. There is no other evidence for intracranial hemorrhage.   In addition, I personally reviewed the images through the PACS system.   Her Past Medical History Is Significant For: Past Medical History  Diagnosis Date  . Hypertension   . Hyperlipidemia   . Thyroid disease     Hypothyroidism  . GERD (gastroesophageal reflux disease)   . Arthritis     Degenerative  . Peripheral vascular disease     Left   Fem-Pop and  tibial Thrombectomy  . High platelet count   . Dementia   . Pain in toe of right foot   . Hypothyroidism   . Long term (current) use of anticoagulants 03/13/2014  . Neuromuscular disorder   . Parkinson's disease     Her Past Surgical History Is Significant For: Past Surgical History  Procedure Laterality Date  . Femoral-popliteal bypass graft  2002    Left  Dr. Sherren Farmer Early  . Abdominal hysterectomy    . Cataract  surgery      left eye  . Colonoscopy N/A 09/06/2014    Procedure: COLONOSCOPY;  Surgeon: Tanya Columbia, MD;  Location: [facility] ENDOSCOPY;  Service: Endoscopy;  Laterality: N/A;    Her Family History Is Significant For: Family History  Problem Relation Age of Onset  . Heart failure Mother   . Stroke Father     Her Social History Is Significant For: History   Social History  . Marital Status: Divorced    Spouse Name: N/A  . Number of Children: N/A  . Years of Education: N/A   Social History Main Topics  . Smoking status: Former Research scientist (life sciences)  . Smokeless tobacco: Never Used  . Alcohol Use: No  . Drug Use: No  . Sexual Activity: No   Other Topics Concern  . 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    Her Allergies Are:  No Known Allergies:   Her Current Medications Are:  Outpatient Encounter Prescriptions as of 12/08/2014  Medication Sig  .  apixaban (ELIQUIS) 2.5 MG TABS tablet Take 1 tablet (2.5 mg total) by mouth 2 (two) times daily.  Marland Kitchen atorvastatin (LIPITOR) 10 MG tablet Take 10 mg by mouth daily.  . benzonatate (TESSALON) 100 MG capsule Take by mouth as needed for cough. Take every 8 hours as needed  ,do not crush or chew  . calcium-vitamin D (OSCAL WITH D) 500-200 MG-UNIT per tablet Take 1 tablet by mouth 2 (two) times daily.  . Cranberry 250 MG CAPS Take 250 mg by mouth daily.  . hydroxyurea (HYDREA) 500 MG capsule Take 1,000 mg by mouth daily.   Marland Kitchen levothyroxine (SYNTHROID, LEVOTHROID) 150 MCG tablet   . losartan (COZAAR) 50 MG tablet Take 1.5 tablets (75 mg total) by mouth daily.  . Multiple Vitamins-Minerals (MULTI COMPLETE PO) Take 1 tablet by mouth daily.  . Omega-3 Fatty Acids (FISH OIL) 1000 MG CAPS Take 1 capsule (1,000 mg total) by mouth daily.  . pantoprazole (PROTONIX) 20 MG tablet Take 20 mg by mouth daily.  Marland Kitchen senna-docusate (SENOKOT-S) 8.6-50 MG per tablet Take 1 tablet by mouth daily.  .  sodium chloride (OCEAN) 0.65 % SOLN nasal spray Place 1 spray into both nostrils as needed for congestion.  . [DISCONTINUED] levothyroxine (SYNTHROID, LEVOTHROID) 200 MCG tablet Take 200 mcg by mouth daily before breakfast.  :  Review of Systems:  Out of a complete 14 point review of systems, all are reviewed and negative with the exception of these symptoms as listed below:   Review of Systems  Gastrointestinal:       Frequent UTIs   Neurological:       Increased rigidity, stiffness. Patient has not started Palliative Care. Currently in PT.      Objective:  Neurologic Exam  Physical Exam Physical Examination:   Filed Vitals:   12/08/14 1410  BP: 130/78  Pulse: 68  Resp: 12    General Examination: The patient is an elderly lady situated in her WC in no apparent distress. She is minimally verbal to non-verbal today. She is not able to follow simple verbal commands and does not mimic. She is leaning to the right in the wheelchair.  HEENT: Normocephalic, atraumatic, pupils are equal, round and reactive to light and accommodation. Funduscopic exam is not possible. Oropharynx is not fully visible. She has moderate mouth dryness however. She has missing teeth. Neck is mild to moderately rigid. She has no lip, neck or jaw tremor. She has no significant facial masking. She has no one-sided facial asymmetry.   Chest: is clear to auscultation without wheezing, rhonchi or crackles noted.  Heart: sounds are regular and normal without murmurs, rubs or gallops noted.   Abdomen: is soft, non-tender and non-distended with normal bowel sounds appreciated on auscultation.  Extremities: There is trace pitting edema in the distal lower extremities bilaterally, distally.   Skin: is warm and dry with no trophic changes noted.   Musculoskeletal: exam reveals no obvious joint deformities, tenderness or joint swelling or erythema.   Neurologically:  Mental status: The patient is awake and alert,  paying little attention. She is very minimally verbal. On 06/05/2014: MMSE was 6/30.  She is not lethargic. She moves all 4 extremities. Tone is mildly increased throughout with mild cogwheeling noted in both upper extremities. She has no resting tremor. She does not participate in fine motor testing or sensory or gait or cerebellar testing.  Assessment and Plan:   In summary, Tanya Farmer is a very pleasant 81 year old female with an  underlying complex medical history of peripheral vascular disease, status post left femoropopliteal bypass and tibial thrombectomy, hypertension, hyperlipidemia, hypothyroidism, atrial fibrillation, reflux disease, thrombocytosis, recurrent UTIs who presents for follow up consultation of her advanced dementia with an element of mild parkinsonism. She was  in the past admitted to the hospital for lower GI bleed. She was treated for recurrent UTI. She had been on Aricept and Namenda in both were stopped secondary to prolonged QT. I did not suggest restarting her memory medicine. She has advanced dementia, probably a combination of vascular dementia and Alzheimer's disease. I reviewed her most recent brain MRI and explained the findings to her family. She has mild parkinsonism. I did not suggest restarting Sinemet for fear of side effects. Her daughter asked about muscle relaxant and I explained that any muscle relaxant may make her too sleepy and she may not be able to participate in physical therapy. I think she is doing reasonably well in physical therapy at her rehabilitation facility. I suggested continuation of the current management. I will see her back routinely in about 6 months, sooner if needed. I asked  her family to consider pursuing palliative care through her primary care physician. Her daughter was questioning whether she needs her pantoprazole. I suggested that they have her primary care physician reevaluate the need for Protonix.  I spent 20 minutes in total  face-to-face time with the patient, more than 50% of which was spent in counseling and coordination of care, reviewing test results, reviewing medication and discussing or reviewing the diagnosis of advanced dementia, its prognosis and treatment options.

## 2014-12-10 ENCOUNTER — Non-Acute Institutional Stay (SKILLED_NURSING_FACILITY): Payer: Medicare Other | Admitting: Registered Nurse

## 2014-12-10 DIAGNOSIS — Z87440 Personal history of urinary (tract) infections: Secondary | ICD-10-CM

## 2014-12-10 DIAGNOSIS — I10 Essential (primary) hypertension: Secondary | ICD-10-CM | POA: Diagnosis not present

## 2014-12-10 DIAGNOSIS — D473 Essential (hemorrhagic) thrombocythemia: Secondary | ICD-10-CM

## 2014-12-10 DIAGNOSIS — K5900 Constipation, unspecified: Secondary | ICD-10-CM | POA: Diagnosis not present

## 2014-12-10 DIAGNOSIS — F0390 Unspecified dementia without behavioral disturbance: Secondary | ICD-10-CM

## 2014-12-10 DIAGNOSIS — E785 Hyperlipidemia, unspecified: Secondary | ICD-10-CM

## 2014-12-10 DIAGNOSIS — R1310 Dysphagia, unspecified: Secondary | ICD-10-CM | POA: Diagnosis not present

## 2014-12-10 DIAGNOSIS — E039 Hypothyroidism, unspecified: Secondary | ICD-10-CM

## 2014-12-10 DIAGNOSIS — F03C0 Unspecified dementia, severe, without behavioral disturbance, psychotic disturbance, mood disturbance, and anxiety: Secondary | ICD-10-CM

## 2014-12-10 DIAGNOSIS — I480 Paroxysmal atrial fibrillation: Secondary | ICD-10-CM | POA: Diagnosis not present

## 2014-12-10 DIAGNOSIS — K219 Gastro-esophageal reflux disease without esophagitis: Secondary | ICD-10-CM | POA: Diagnosis not present

## 2014-12-10 DIAGNOSIS — M810 Age-related osteoporosis without current pathological fracture: Secondary | ICD-10-CM

## 2014-12-11 NOTE — Progress Notes (Signed)
Patient ID: Tanya Farmer, female   DOB: [birth year], 81 y.o.   MRN: [id number]   Place of Service: [facility]  No Known Allergies  Code Status: Full Code  Goals of Care: Longevity/LTC  Chief Complaint  Patient presents with  . Medical Management of Chronic Issues    HTN, essential thrombocytosis, Hx recurrent UTis, HLD, hypothyroidism, severe dementia, GERD    HPI 81 y.o. female LTC resident with PMH of PAF on chronic anticoagulation with equilis, HTN, essential thrombocytosis, severe dementia, HLD, hypothyroidism among others is being seen for a routine visit for management of her chronic issues. Weight stable over the past 30 days (2lbs weight loss). No recent fall or skin concerns reported. No change in behavior or functional status reported. No concerns from staff. BP adequately controlled with increased dose of losartan, BP range 130-160s/70-80s with most reading in 130-140s. No issues with GERD per daughter. HLD stable on statin. Constipation stable on current regimen. No signs of bleeding with Eliquis for afib. Seen in wheelchair today. Unable to obtain HPI and ROS  Review of Systems Unable to obtain due to dementia.    Past Medical History  Diagnosis Date  . Hypertension   . Hyperlipidemia   . Thyroid disease     Hypothyroidism  . GERD (gastroesophageal reflux disease)   . Arthritis     Degenerative  . Peripheral vascular disease     Left   Fem-Pop and  tibial Thrombectomy  . High platelet count   . Dementia   . Pain in toe of right foot   . Hypothyroidism   . Long term (current) use of anticoagulants 03/13/2014  . Neuromuscular disorder   . Parkinson's disease     Past Surgical History  Procedure Laterality Date  . Femoral-popliteal bypass graft  2002    Left  Dr. Sherren Mocha Early  . Abdominal hysterectomy    . Cataract surgery      left eye  . Colonoscopy N/A 09/06/2014    Procedure: COLONOSCOPY;  Surgeon: Jeryl Columbia, MD;  Location: [facility] ENDOSCOPY;   Service: Endoscopy;  Laterality: N/A;    History  Substance Use Topics  . Smoking status: Former Research scientist (life sciences)  . Smokeless tobacco: Never Used  . Alcohol Use: No    Family History  Problem Relation Age of Onset  . Heart failure Mother   . Stroke Father       Medication List       This list is accurate as of: 12/10/14 11:59 PM.  Always use your most recent med list.               apixaban 2.5 MG Tabs tablet  Commonly known as:  ELIQUIS  Take 1 tablet (2.5 mg total) by mouth 2 (two) times daily.     atorvastatin 10 MG tablet  Commonly known as:  LIPITOR  Take 10 mg by mouth daily.     calcium-vitamin D 500-200 MG-UNIT per tablet  Commonly known as:  OSCAL WITH D  Take 1 tablet by mouth 2 (two) times daily.     Cranberry 250 MG Caps  Take 250 mg by mouth daily.     Fish Oil 1000 MG Caps  Take 1 capsule (1,000 mg total) by mouth daily.     hydroxyurea 500 MG capsule  Commonly known as:  HYDREA  Take 1,000 mg by mouth daily.     levothyroxine 200 MCG tablet  Commonly known as:  SYNTHROID, LEVOTHROID  Take  200 mcg by mouth daily before breakfast.     losartan 100 MG tablet  Commonly known as:  COZAAR  Take 100 mg by mouth daily.     MULTI COMPLETE PO  Take 1 tablet by mouth daily.     senna-docusate 8.6-50 MG per tablet  Commonly known as:  Senokot-S  Take 1 tablet by mouth daily.     sodium chloride 0.65 % Soln nasal spray  Commonly known as:  OCEAN  Place 1 spray into both nostrils as needed for congestion.        Physical Exam  BP 142/87 mmHg  Pulse 88  Temp(Src) 97.6 F (36.4 C)  Resp 16  Ht 5' (1.524 m)  Wt 125 lb 9.6 oz (56.972 kg)  BMI 24.53 kg/m2  SpO2 97%  Constitutional: Thin/frail elderly female in no acute distress.  HEENT: Normocephalic and atraumatic. PERRL. No scleral icterus. Oral mucosa moist.  Neck: No lymphadenopathy, masses, or thyromegaly. No JVD or carotid bruits. Cardiac: Normal S1, S2. Irregularly irregular. Distal pulses  intact. Trace pitting edema of BLE Lungs: No respiratory distress. Breath sounds clear bilaterally without rales, rhonchi, or wheezes. Abdomen: Audible bowel sounds in all quadrants. Soft, nontender, nondistended.  Musculoskeletal: able to move all extremities. Generalized weakness with limited ROM throughout.  Skin: Warm and dry. No rash noted.   Neurological: Alert  Psychiatric: flat affect. Has eyes closed most of the time.   Labs Reviewed  CBC Latest Ref Rng 11/14/2014 09/10/2014 09/07/2014  WBC - 8.0 6.5 4.9  Hemoglobin 12.0 - 16.0 g/dL 13.6 10.6(A) 9.8(L)  Hematocrit 36 - 46 % 42 33(A) 30.9(L)  Platelets 150 - 399 K/L 900(A) 878(A) 757(H)    CMP Latest Ref Rng 11/14/2014 09/08/2014 09/07/2014  Glucose 70 - 99 mg/dL - 91 91  BUN 4 - 21 mg/dL 12 12 11   Creatinine 0.5 - 1.1 mg/dL 0.5 0.86 0.84  Sodium 137 - 147 mmol/L 137 135 140  Potassium 3.4 - 5.3 mmol/L 4.4 4.1 3.7  Chloride 96 - 112 mEq/L - 108 110  CO2 19 - 32 mmol/L - 20 25  Calcium 8.4 - 10.5 mg/dL - 8.1(L) 8.6  Total Protein 6.0 - 8.3 g/dL - - -  Albumin 3.5 - 4.7 g/dL - - -  Total Bilirubin 0.3 - 1.2 mg/dL - - -  Alkaline Phos 39 - 117 U/L - - -  AST 0 - 37 U/L - - -  ALT 0 - 35 U/L - - -    Lab Results  Component Value Date   HGBA1C 5.7* 01/21/2014    Lab Results  Component Value Date   TSH 7.23* 11/21/2014    Lipid Panel     Component Value Date/Time   CHOL 137 01/16/2014   TRIG 81 01/16/2014   LDLCALC 71 01/16/2014   Assessment & Plan 1. Dementia  Advanced. Further decline anticipated. Continue assist with ADLs and monitor for change in behaviors. Continue fall risk and pressure ulcer precautions  2. Paroxysmal atrial fibrillation Stable. Rate controlled. Continue Eliquis 2.5mg  two times daily. No signs of bleeding reported. Continue to monitor for signs of bleeding  3. Essential hypertension, benign Adequately controlled with elevated readings. Continue losartan 100mg  daily. Continue to monitor  BP and her status  4. Essential thrombocytosis Is followed by Dr. Fransisco Beau at [facility]. Last platelet count >900. Continue hydroxyurea 1000mg  daily and monitor  5. Dysphagia Stable. Continue pureed diet with thin liquids and feeding assistance. Patient is to sit  up right in dining room as tolerated and nursing staff to provide feeding assist and continue aspiration precautions.   6. Gastroesophageal reflux disease, esophagitis presence not specified No signs of recurrence GI bleed or GERD. Will discontinue protonix at this time and continue to monitor.   7. Slow transit constipation Stable. Continue senna s 8.6/50mg  daily. Encourage hydration   8. HLD LDL at goal. Continue lipitor 10mg  daily  9. Hypothyroidism Recent TSH 7.234. Continue levothyroxine 258mcg daily and monitor for now.   10. Hx Recurrent UTI No recent UTI. Continue cranberry 250mg  daily for uti prophylaxis. Encourage hydration. Continue meticulous peri care.   11. Osteoporosis T score -4.4. Continue oscal-D 500/200 twice daily. Daughter stated wanting monthly injection as treatment for her osteoporosis due to her poor po intake and dysphagia. I have mentioned Prolia and Forteo to the daughter. She will do some research and let me know about her decision later. Continue fall risk precautions   Family/Staff Communication Plan of care discussed with daughter and nursing staff. Daughter and nursing staff verbalized understanding and agree with plan of care. No additional questions or concerns reported.    Arthur Holms, MSN, AGNP-C [facility] [address] [place], [place] [address] [phone] [8am-5pm] After hours: [phone]

## 2014-12-24 ENCOUNTER — Non-Acute Institutional Stay (SKILLED_NURSING_FACILITY): Payer: Medicare Other | Admitting: Registered Nurse

## 2014-12-24 ENCOUNTER — Encounter: Payer: Self-pay | Admitting: Registered Nurse

## 2014-12-24 DIAGNOSIS — M25622 Stiffness of left elbow, not elsewhere classified: Secondary | ICD-10-CM | POA: Diagnosis not present

## 2014-12-24 DIAGNOSIS — R8290 Unspecified abnormal findings in urine: Secondary | ICD-10-CM | POA: Diagnosis not present

## 2014-12-24 NOTE — Progress Notes (Signed)
Patient ID: Tanya Farmer, female   DOB: [birth year], 81 y.o.   MRN: [id number]   Place of Service: [facility]  No Known Allergies  Code Status: Full Code  Goals of Care: Longevity/LTC  Chief Complaint  Patient presents with  . Acute Visit    left arm stiffness, guarding     HPI 81 y.o. female LTC resident with PMH of PAF on chronic anticoagulation with equilis, HTN, essential thrombocytosis, severe dementia, HLD, hypothyroidism among others is being seen for an acute visit at the request of daughter for the evaluation of right arm stiffness and guarding. She thinks it was a result of improper transfer from nursing staff. Expresses interest of having xray of right shoulder/arm to rule out fractures. Also reports foul urine odor-concerning for UTIs. Per PT, patient does have limited ROM of right shoulder and stiffness-she responded well to heat. Daughter also would for her to go see an orthopedist who specialized in osteoporosis, Dr. Estanislado Pandy of [place] to discuss about her osteoporosis (t-score -4.4) and other musculoskeletal concerns.  Review of Systems Unable to obtain due to dementia.    Past Medical History  Diagnosis Date  . Hypertension   . Hyperlipidemia   . Thyroid disease     Hypothyroidism  . GERD (gastroesophageal reflux disease)   . Arthritis     Degenerative  . Peripheral vascular disease     Left   Fem-Pop and  tibial Thrombectomy  . High platelet count   . Dementia   . Pain in toe of right foot   . Hypothyroidism   . Long term (current) use of anticoagulants 03/13/2014  . Neuromuscular disorder   . Parkinson's disease     Past Surgical History  Procedure Laterality Date  . Femoral-popliteal bypass graft  2002    Left  Dr. Sherren Mocha Early  . Abdominal hysterectomy    . Cataract surgery      left eye  . Colonoscopy N/A 09/06/2014    Procedure: COLONOSCOPY;  Surgeon: Jeryl Columbia, MD;  Location: [facility] ENDOSCOPY;  Service: Endoscopy;   Laterality: N/A;    History  Substance Use Topics  . Smoking status: Former Research scientist (life sciences)  . Smokeless tobacco: Never Used  . Alcohol Use: No    Family History  Problem Relation Age of Onset  . Heart failure Mother   . Stroke Father       Medication List       This list is accurate as of: 12/24/14 11:59 PM.  Always use your most recent med list.               acetaminophen 325 MG tablet  Commonly known as:  TYLENOL  Take 650 mg by mouth daily with breakfast.     apixaban 2.5 MG Tabs tablet  Commonly known as:  ELIQUIS  Take 1 tablet (2.5 mg total) by mouth 2 (two) times daily.     atorvastatin 10 MG tablet  Commonly known as:  LIPITOR  Take 10 mg by mouth daily.     calcium-vitamin D 500-200 MG-UNIT per tablet  Commonly known as:  OSCAL WITH D  Take 1 tablet by mouth 2 (two) times daily.     Cranberry 250 MG Caps  Take 250 mg by mouth daily.     Fish Oil 1000 MG Caps  Take 1 capsule (1,000 mg total) by mouth daily.     hydroxyurea 500 MG capsule  Commonly known as:  HYDREA  Take 1,000  mg by mouth daily.     levothyroxine 200 MCG tablet  Commonly known as:  SYNTHROID, LEVOTHROID  Take 200 mcg by mouth daily before breakfast.     losartan 100 MG tablet  Commonly known as:  COZAAR  Take 100 mg by mouth daily.     MULTI COMPLETE PO  Take 1 tablet by mouth daily.     senna-docusate 8.6-50 MG per tablet  Commonly known as:  Senokot-S  Take 1 tablet by mouth daily.     sodium chloride 0.65 % Soln nasal spray  Commonly known as:  OCEAN  Place 1 spray into both nostrils as needed for congestion.        Physical Exam  BP 137/90 mmHg  Pulse 87  Temp(Src) 98 F (36.7 C)  Resp 16  SpO2 96%  Constitutional: Thin/frail elderly female in no acute distress.  HEENT: Normocephalic and atraumatic. PERRL. No scleral icterus. Oral mucosa moist.  Neck: No lymphadenopathy, masses, or thyromegaly. No JVD or carotid bruits. Cardiac: Normal S1, S2. Irregularly  irregular. Distal pulses intact.  Lungs: No respiratory distress. Breath sounds clear bilaterally without rales, rhonchi, or wheezes. Abdomen: Audible bowel sounds in all quadrants. Soft, nontender, nondistended.  Musculoskeletal: able to move all extremities. Generalized weakness with limited ROM throughout. Right shoulder joint nontender to palpation. No bruising or signs of injury noted.  Skin: Warm and dry.    Neurological: Arouseable  Psychiatric: flat affect.   Labs Reviewed  CBC Latest Ref Rng 11/14/2014 09/10/2014 09/07/2014  WBC - 8.0 6.5 4.9  Hemoglobin 12.0 - 16.0 g/dL 13.6 10.6(A) 9.8(L)  Hematocrit 36 - 46 % 42 33(A) 30.9(L)  Platelets 150 - 399 K/L 900(A) 878(A) 757(H)    CMP Latest Ref Rng 11/14/2014 09/08/2014 09/07/2014  Glucose 70 - 99 mg/dL - 91 91  BUN 4 - 21 mg/dL 12 12 11   Creatinine 0.5 - 1.1 mg/dL 0.5 0.86 0.84  Sodium 137 - 147 mmol/L 137 135 140  Potassium 3.4 - 5.3 mmol/L 4.4 4.1 3.7  Chloride 96 - 112 mEq/L - 108 110  CO2 19 - 32 mmol/L - 20 25  Calcium 8.4 - 10.5 mg/dL - 8.1(L) 8.6  Total Protein 6.0 - 8.3 g/dL - - -  Albumin 3.5 - 4.7 g/dL - - -  Total Bilirubin 0.3 - 1.2 mg/dL - - -  Alkaline Phos 39 - 117 U/L - - -  AST 0 - 37 U/L - - -  ALT 0 - 35 U/L - - -   Assessment & Plan 1. Stiffness of left upper arm joint Mostly likely from OA. Tylenol 650mg  daily in the morning for pain per daughter request-if not effective, may consider extra strength tylenol. Daughter also would like a small dose of muscle relaxant at bedtime to help her rest. However, with patient's hx of prolonged QTc and somnolence-will hold off on muscle relaxant at this time. Initiate ortho referral to Dr. Estanislado Pandy per daughter request. Continue to monitor for now.   2. Foul smelling urine Explain to daughter foul smelling urine could be a result of diet. Will check UA for further evaluation. Nursing staff to ensure patient receive adequate hydration and good pericare.    Family/Staff Communication Plan of care discussed with daughter and nursing staff. Daughter and nursing staff verbalized understanding and agree with plan of care. No additional questions or concerns reported.    Arthur Holms, MSN, AGNP-C [place], [place] [address] [phone] [8am-5pm] After  hours: [phone]

## 2014-12-30 LAB — BASIC METABOLIC PANEL
BUN: 9 mg/dL (ref 4–21)
Creatinine: 0.7 mg/dL (ref 0.5–1.1)
GLUCOSE: 72 mg/dL
POTASSIUM: 4.1 mmol/L (ref 3.4–5.3)
SODIUM: 142 mmol/L (ref 137–147)

## 2014-12-30 LAB — CBC AND DIFFERENTIAL
HCT: 38 % (ref 36–46)
Hemoglobin: 12.1 g/dL (ref 12.0–16.0)
Platelets: 1009 10*3/uL — AB (ref 150–399)
WBC: 5.2 10^3/mL

## 2015-01-08 ENCOUNTER — Non-Acute Institutional Stay (SKILLED_NURSING_FACILITY): Payer: Medicare Other | Admitting: Registered Nurse

## 2015-01-08 ENCOUNTER — Encounter: Payer: Self-pay | Admitting: Registered Nurse

## 2015-01-08 DIAGNOSIS — D473 Essential (hemorrhagic) thrombocythemia: Secondary | ICD-10-CM

## 2015-01-08 DIAGNOSIS — E039 Hypothyroidism, unspecified: Secondary | ICD-10-CM

## 2015-01-08 DIAGNOSIS — E785 Hyperlipidemia, unspecified: Secondary | ICD-10-CM | POA: Diagnosis not present

## 2015-01-08 DIAGNOSIS — N3080 Other cystitis without hematuria: Secondary | ICD-10-CM | POA: Diagnosis not present

## 2015-01-08 DIAGNOSIS — N3090 Cystitis, unspecified without hematuria: Secondary | ICD-10-CM

## 2015-01-08 DIAGNOSIS — I10 Essential (primary) hypertension: Secondary | ICD-10-CM

## 2015-01-08 DIAGNOSIS — R1310 Dysphagia, unspecified: Secondary | ICD-10-CM

## 2015-01-08 DIAGNOSIS — M810 Age-related osteoporosis without current pathological fracture: Secondary | ICD-10-CM

## 2015-01-08 DIAGNOSIS — F03C0 Unspecified dementia, severe, without behavioral disturbance, psychotic disturbance, mood disturbance, and anxiety: Secondary | ICD-10-CM

## 2015-01-08 DIAGNOSIS — F0390 Unspecified dementia without behavioral disturbance: Secondary | ICD-10-CM

## 2015-01-08 DIAGNOSIS — I480 Paroxysmal atrial fibrillation: Secondary | ICD-10-CM | POA: Diagnosis not present

## 2015-01-08 DIAGNOSIS — K5900 Constipation, unspecified: Secondary | ICD-10-CM

## 2015-01-08 NOTE — Progress Notes (Signed)
Patient ID: Tanya Farmer, female   DOB: [birth year], 81 y.o.   MRN: [id number]   Place of Service: [facility]  No Known Allergies  Code Status: Full Code  Goals of Care: Longevity/LTC  Chief Complaint  Patient presents with  . Medical Management of Chronic Issues    dementia, recurrent UTIs, Hypothyroidism, dysphagia, afib    HPI 81 y.o. female LTC resident with PMH of PAF on chronic anticoagulation with equilis, HTN, essential thrombocytosis, severe dementia, HLD, hypothyroidism among others is being seen for a routine visit for management of her chronic issues. Weight stable over the past 30 days (1lb weight gain). No recent fall or skin concerns reported. No change in behavior or functional status reported. No concerns from staff. HTN adequately controlled with current regimen with BP range 110-160s/60-90s, but mostly 130s-140s/70-80s. HLD stable on statin. No issues with constipation. Dysphagia stable-no signs/symptoms of aspiration. Still on chronic oral anticoagulation for Afib. Seen in room today. Unable to obtain HPI and ROS  Review of Systems Unable to obtain due to dementia.    Past Medical History  Diagnosis Date  . Hypertension   . Hyperlipidemia   . Thyroid disease     Hypothyroidism  . GERD (gastroesophageal reflux disease)   . Arthritis     Degenerative  . Peripheral vascular disease     Left   Fem-Pop and  tibial Thrombectomy  . High platelet count   . Dementia   . Pain in toe of right foot   . Hypothyroidism   . Long term (current) use of anticoagulants 03/13/2014  . Neuromuscular disorder   . Parkinson's disease     Past Surgical History  Procedure Laterality Date  . Femoral-popliteal bypass graft  2002    Left  Dr. Sherren Mocha Early  . Abdominal hysterectomy    . Cataract surgery      left eye  . Colonoscopy N/A 09/06/2014    Procedure: COLONOSCOPY;  Surgeon: Jeryl Columbia, MD;  Location: [facility] ENDOSCOPY;  Service: Endoscopy;  Laterality: N/A;     History  Substance Use Topics  . Smoking status: Former Research scientist (life sciences)  . Smokeless tobacco: Never Used  . Alcohol Use: No    Family History  Problem Relation Age of Onset  . Heart failure Mother   . Stroke Father       Medication List       This list is accurate as of: 01/08/15  9:18 PM.  Always use your most recent med list.               acetaminophen 325 MG tablet  Commonly known as:  TYLENOL  Take 650 mg by mouth daily with breakfast.     apixaban 2.5 MG Tabs tablet  Commonly known as:  ELIQUIS  Take 1 tablet (2.5 mg total) by mouth 2 (two) times daily.     atorvastatin 10 MG tablet  Commonly known as:  LIPITOR  Take 10 mg by mouth daily.     calcium-vitamin D 500-200 MG-UNIT per tablet  Commonly known as:  OSCAL WITH D  Take 1 tablet by mouth 2 (two) times daily.     Cranberry 250 MG Caps  Take 250 mg by mouth daily.     Fish Oil 1000 MG Caps  Take 1 capsule (1,000 mg total) by mouth daily.     hydroxyurea 500 MG capsule  Commonly known as:  HYDREA  Take 1,000 mg by mouth daily.  levothyroxine 200 MCG tablet  Commonly known as:  SYNTHROID, LEVOTHROID  Take 200 mcg by mouth daily before breakfast.     losartan 100 MG tablet  Commonly known as:  COZAAR  Take 100 mg by mouth daily.     MULTI COMPLETE PO  Take 1 tablet by mouth daily.     senna-docusate 8.6-50 MG per tablet  Commonly known as:  Senokot-S  Take 1 tablet by mouth daily.     sodium chloride 0.65 % Soln nasal spray  Commonly known as:  OCEAN  Place 1 spray into both nostrils as needed for congestion.        Physical Exam  BP 132/78 mmHg  Pulse 60  Temp(Src) 98.6 F (37 C)  Resp 16  Ht 5' (1.524 m)  Wt 126 lb 14.4 oz (57.561 kg)  BMI 24.78 kg/m2  SpO2 97%  Constitutional: Thin/frail elderly female in no acute distress.  HEENT: Normocephalic and atraumatic. PERRL. No scleral icterus. Neck: No lymphadenopathy, masses, or thyromegaly. No JVD or carotid bruits. Cardiac:  Normal S1, S2. Irregularly irregular. Distal pulses intact. Trace pitting edema of BLE Lungs: No respiratory distress. Breath sounds clear bilaterally without adventitious sounds. Abdomen: Audible bowel sounds in all quadrants. Soft, nontender, nondistended.  Musculoskeletal: able to move all extremities. Generalized weakness with limited joints ROM bilaterally Skin: Warm and dry.   Neurological: Arousable Psychiatric: flat affect.   Labs Reviewed  CBC Latest Ref Rng 12/30/2014 11/14/2014 09/10/2014  WBC - 5.2 8.0 6.5  Hemoglobin 12.0 - 16.0 g/dL 12.1 13.6 10.6(A)  Hematocrit 36 - 46 % 38 42 33(A)  Platelets 150 - 399 K/L 1009(A) 900(A) 878(A)    CMP Latest Ref Rng 12/30/2014 11/14/2014 09/08/2014  Glucose 70 - 99 mg/dL - - 91  BUN 4 - 21 mg/dL 9 12 12   Creatinine 0.5 - 1.1 mg/dL 0.7 0.5 0.86  Sodium 137 - 147 mmol/L 142 137 135  Potassium 3.4 - 5.3 mmol/L 4.1 4.4 4.1  Chloride 96 - 112 mEq/L - - 108  CO2 19 - 32 mmol/L - - 20  Calcium 8.4 - 10.5 mg/dL - - 8.1(L)  Total Protein 6.0 - 8.3 g/dL - - -  Albumin 3.5 - 4.7 g/dL - - -  Total Bilirubin 0.3 - 1.2 mg/dL - - -  Alkaline Phos 39 - 117 U/L - - -  AST 0 - 37 U/L - - -  ALT 0 - 35 U/L - - -    Lab Results  Component Value Date   HGBA1C 5.7* 01/21/2014    Lab Results  Component Value Date   TSH 7.23* 11/21/2014    Lipid Panel     Component Value Date/Time   CHOL 137 01/16/2014   TRIG 81 01/16/2014   LDLCALC 71 01/16/2014   Assessment & Plan 1. Severe dementia  Severe. Remains total care. Continue total care with ADLs and monitor for change in behavior  2. Paroxysmal atrial fibrillation Stable. Rate controlled. Continue Eliquis 2.5mg  two times daily. Monitor for signs of potential bleeding  3. Essential hypertension, benign Adequately controlled with elevated readings. Continue losartan 100mg  daily. Continue to monitor BP and her status  4. Essential thrombocytosis Last platelet count 1009. Continue hydroxyurea  1000mg  daily and f/u with Dr. Fransisco Beau at [facility].  5. Dysphagia Stable. Continue pureed diet with thin liquids.  Continue assist with feeding and aspiration precautions    6. Slow transit constipation No issues. Continue senna s 8.6/50mg  daily. Encourage hydration  7. HLD LDL at goal. Continue lipitor 10mg  daily. Recheck lipid  8. Hypothyroidism Last TSH 7.234. Continue levothyroxine 266mcg daily for now. Recheck TSH and adjust dosage accordingly.  9. Recurrent UTI Was recently treated for a positive urine culture. Continue cranberry supplement daily. Encourage hydration. Continue peri care.   10. Osteoporosis Continue oscal-D 500/200 twice daily. Continue fall precautions   Family/Staff Communication Plan of care discussed with nursing staff. Nursing staff verbalized understanding and agree with plan of care. No additional questions or concerns reported.    Arthur Holms, MSN, AGNP-C [facility] [address] [place], [place] [address] [phone] [8am-5pm] After hours: [phone]

## 2015-01-25 IMAGING — CR DG CHEST 2V
1 series · 1 of 1 positions shown · non-contrast
Comparison: 09/16/2013

CLINICAL DATA: Altered mental status.

EXAM:
CHEST  2 VIEW

[w chest lat]
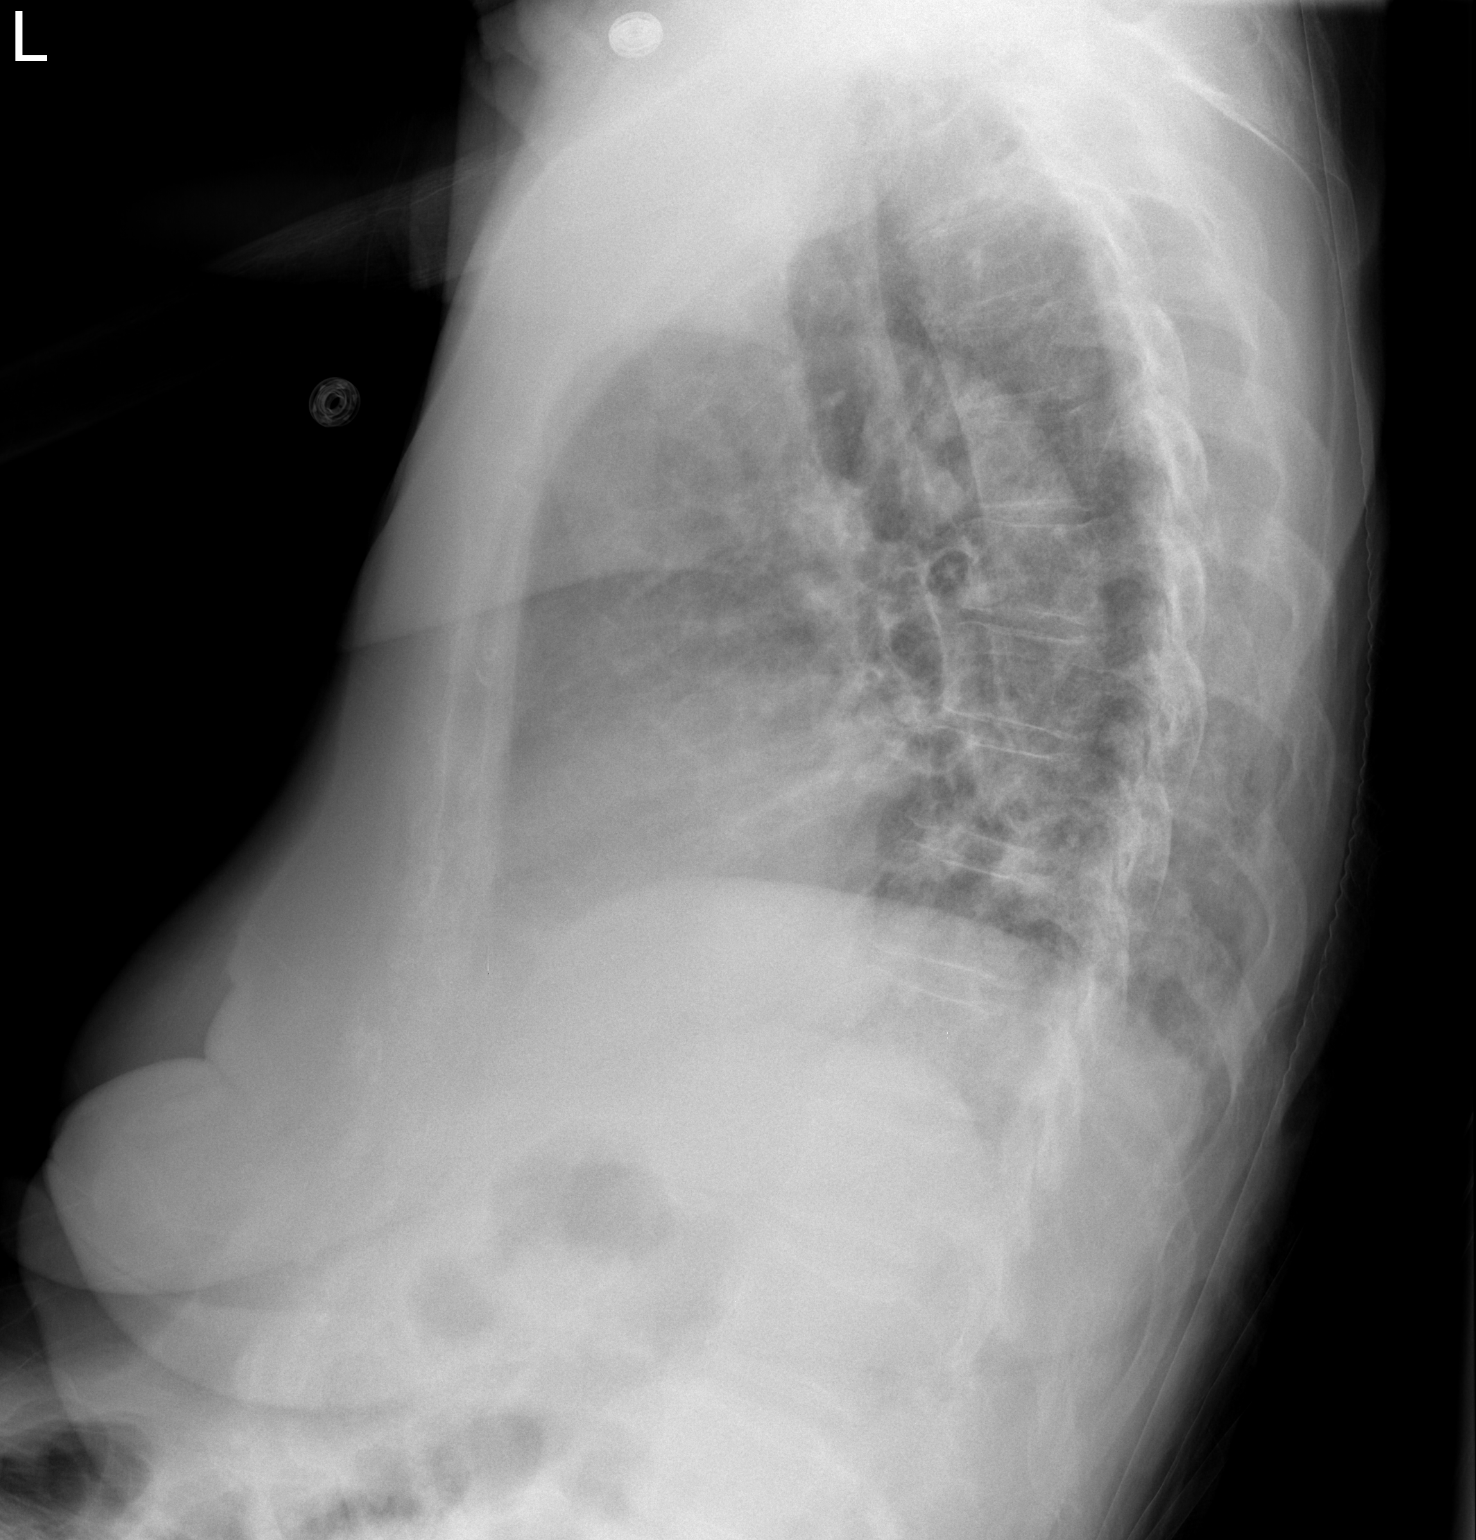

[1 of 1 positions shown; findings below may reference images not displayed]

FINDINGS: There is chronic cardiomegaly with tortuosity and calcification of
the thoracic aorta. Pulmonary vascularity is normal. No infiltrates
or effusions. No acute osseous abnormality.
IMPRESSION: No acute abnormality.  Chronic cardiomegaly.

## 2015-01-25 IMAGING — CT CT HEAD W/O CM
2 series · 16 of 30 positions shown, 20 images · non-contrast
Comparison: MR brain 08/20/2013 and CT head 07/05/2013.

CLINICAL DATA: Increased apathy and lethargy.

EXAM:
CT HEAD WITHOUT CONTRAST
TECHNIQUE: Contiguous axial images were obtained from the base of the skull
through the vertex without intravenous contrast.

[Series 201: head w/o, idose (1) · axial · non-contrast · 0.49mm/px · z∈[+204,+329]mm · 13 of 31 slices shown, 17 images]
[im 3/31  brain]
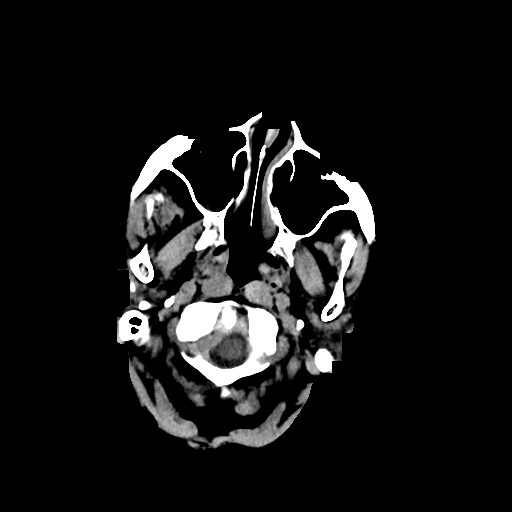
[im 3/31  bone]
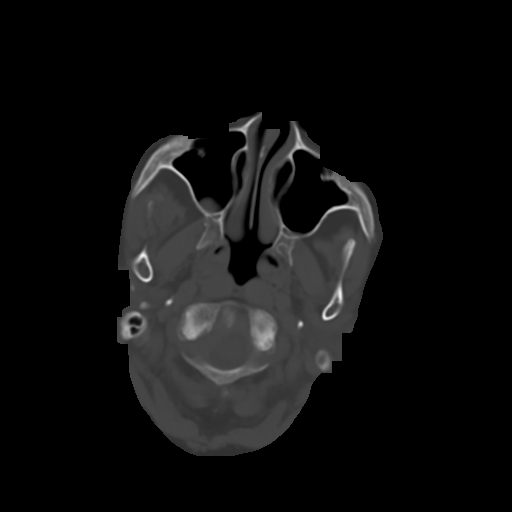
[im 5/31  brain]
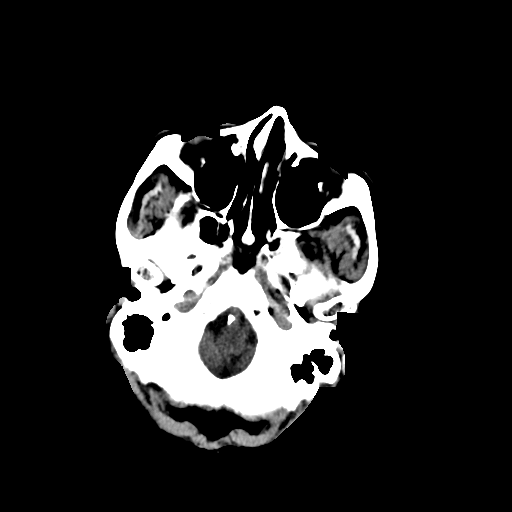
[im 7/31  brain]
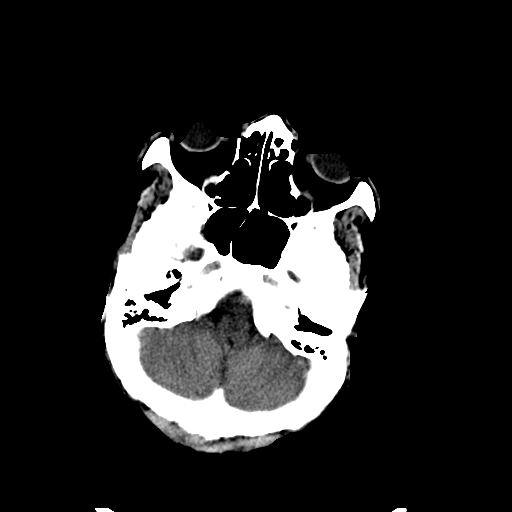
[im 9/31  brain]
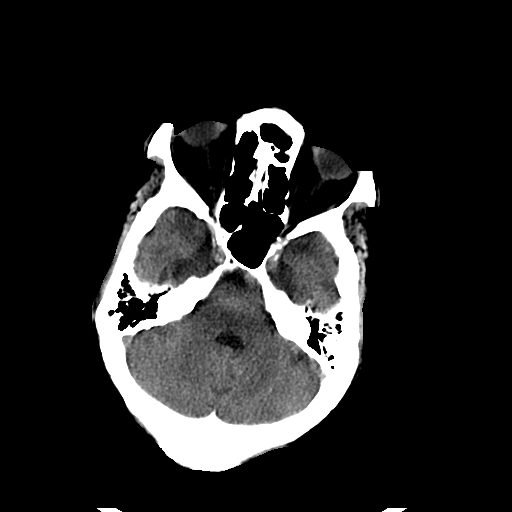
[im 11/31  brain]
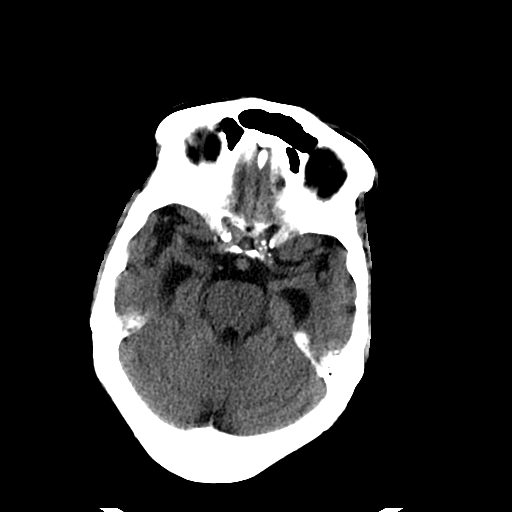
[im 11/31  bone]
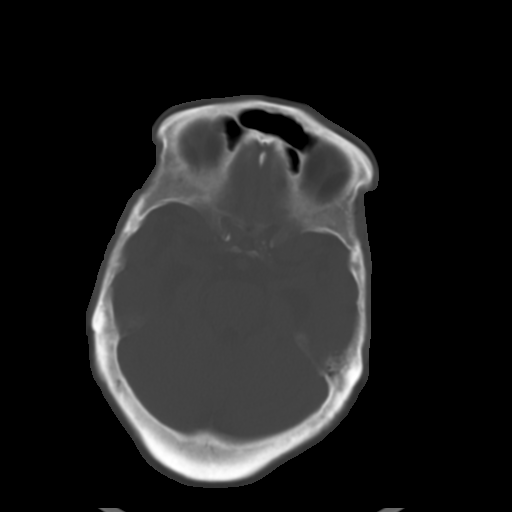
[im 13/31  brain]
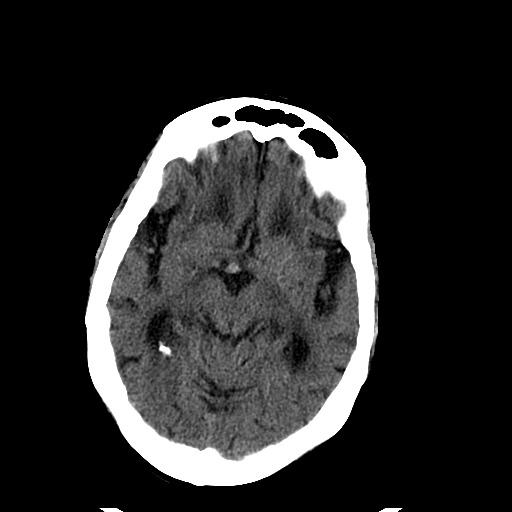
[im 16/31  brain]
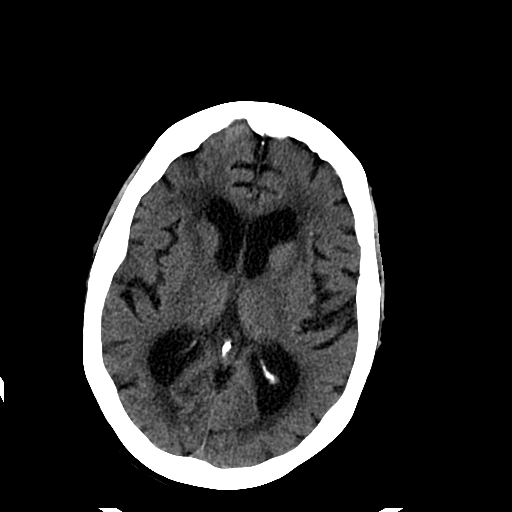
[im 18/31  brain]
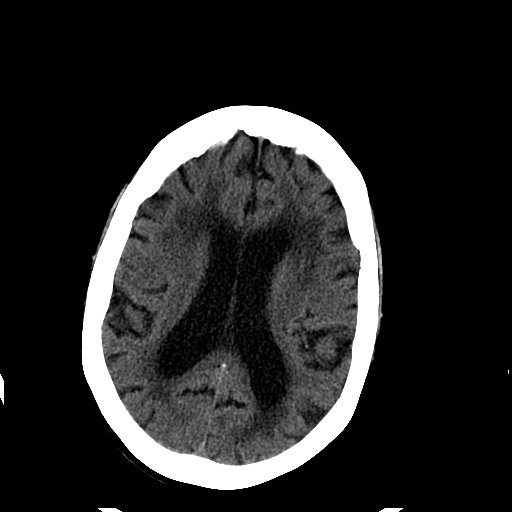
[im 20/31  brain]
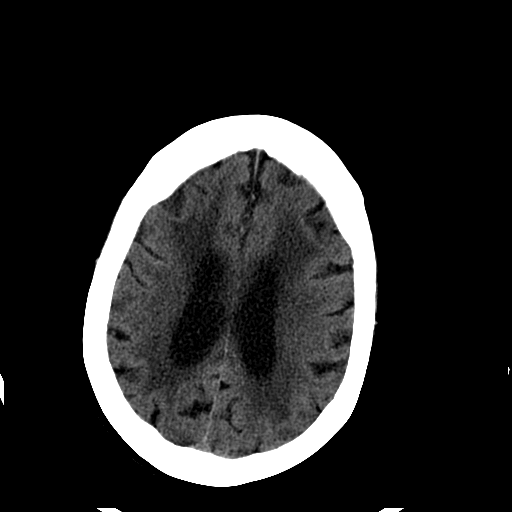
[im 20/31  bone]
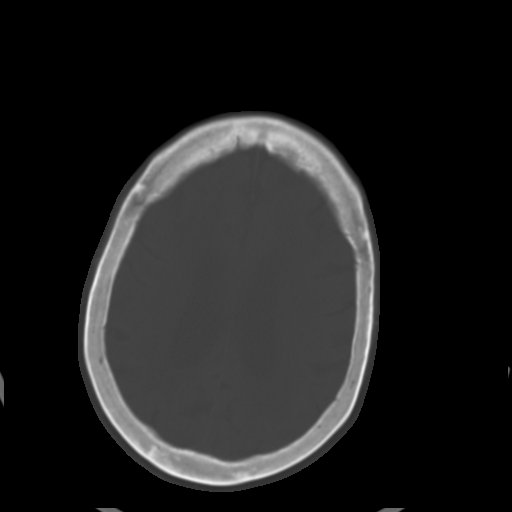
[im 22/31  brain]
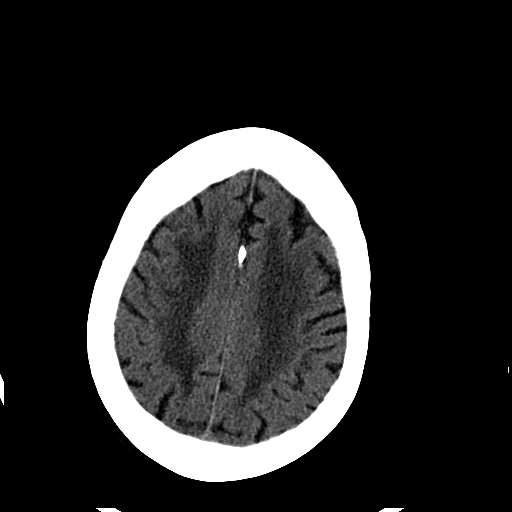
[im 24/31  brain]
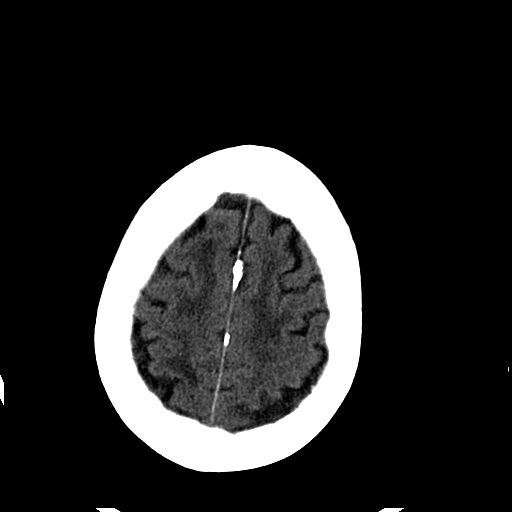
[im 26/31  brain]
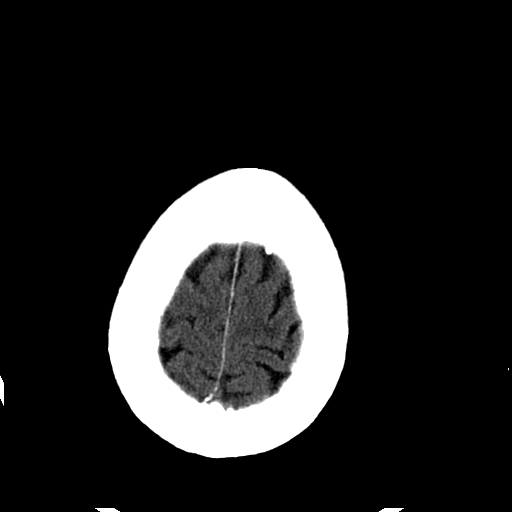
[im 28/31  brain]
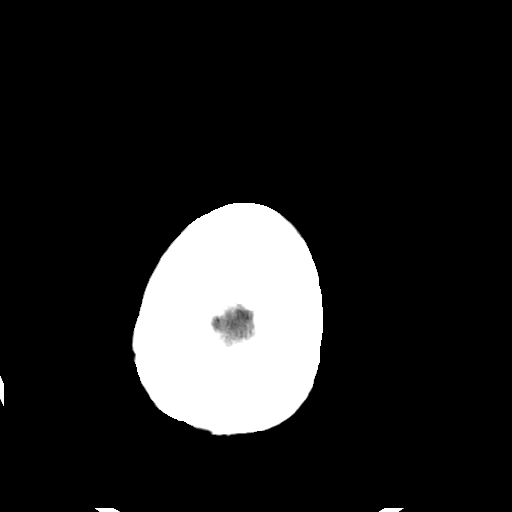
[im 28/31  bone]
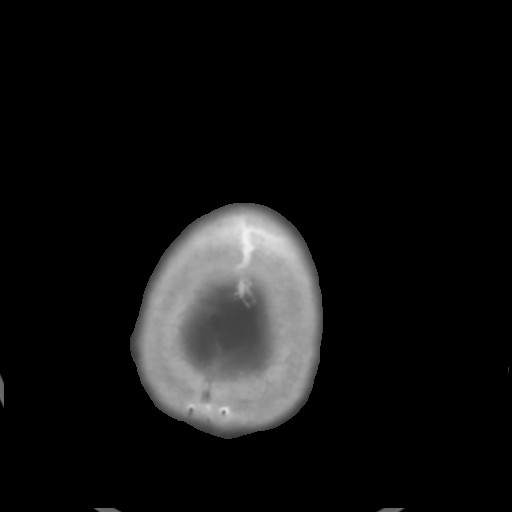

[Series 202: head w/o bone, idose (1) · axial · non-contrast · 0.49mm/px · z∈[+204,+244]mm · 3 of 31 slices shown]
[im 3/31  bone]
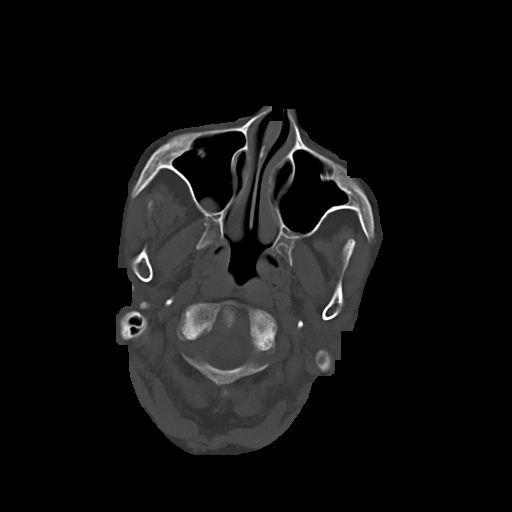
[im 7/31  bone]
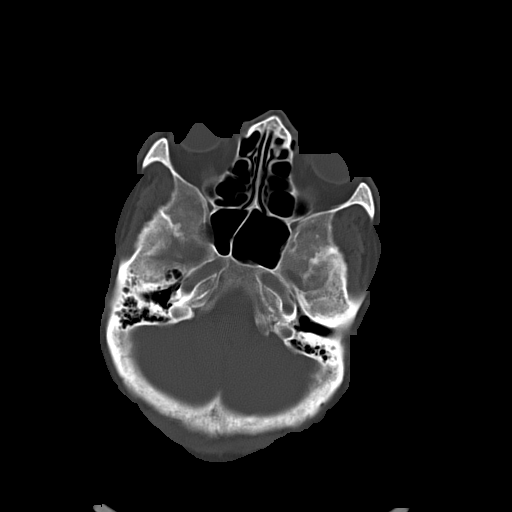
[im 11/31  bone]
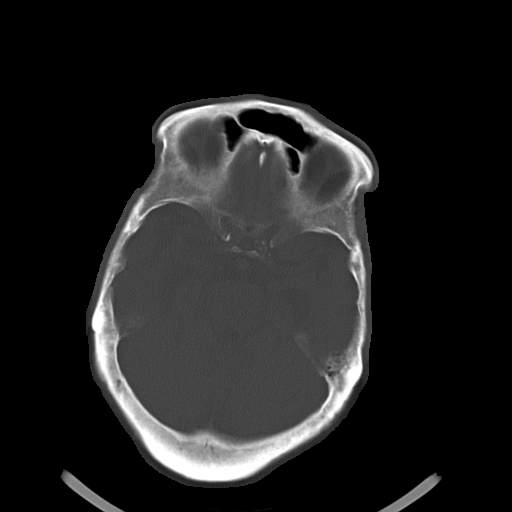

[16 of 30 positions shown; findings below may reference images not displayed]

FINDINGS: No evidence of an acute infarct, acute hemorrhage, mass lesion, mass
effect or hydrocephalus. Atrophy. Diffuse periventricular low
attenuation. Retention cyst or polyp in the right maxillary sinus.
Visualized portions of the paranasal sinuses and mastoid air cells
are otherwise clear.
IMPRESSION: 1. No acute intracranial abnormality.
2. Atrophy and chronic microvascular white matter ischemic changes.

## 2015-02-11 ENCOUNTER — Non-Acute Institutional Stay (SKILLED_NURSING_FACILITY): Payer: Medicare Other | Admitting: Nurse Practitioner

## 2015-02-11 DIAGNOSIS — K921 Melena: Secondary | ICD-10-CM

## 2015-02-11 DIAGNOSIS — I480 Paroxysmal atrial fibrillation: Secondary | ICD-10-CM | POA: Diagnosis not present

## 2015-02-11 LAB — CBC AND DIFFERENTIAL
HEMATOCRIT: 39 % (ref 36–46)
HEMOGLOBIN: 12.7 g/dL (ref 12.0–16.0)
Platelets: 1161 10*3/uL — AB (ref 150–399)
WBC: 6.9 10^3/mL

## 2015-02-11 NOTE — Progress Notes (Signed)
Patient ID: Tanya Farmer, female   DOB: 1926-07-10, 81 y.o.   MRN: [id number]    Nursing Home Location:  [place] of Service: SNF (31)  PCP: Blanchie Serve, MD  No Known Allergies  Chief Complaint  Patient presents with  . Acute Visit    HPI:  Patient is a 81 y.o. female seen today at [facility] due to blood noted in stool. Pt with a underlying complex medical history of peripheral vascular disease, status post left femoropopliteal bypass and tibial thrombectomy, hypertension, hyperlipidemia, hypothyroidism, reflux disease, thrombocytosis, DVTs, recurrent UTI, advanced dementia, GI bleed. Pt was hospitalized in January 2016 with acute GI bleed.  Had previously been on coumadin now on eliquis. Staff noted blood in stool which started 5 days that has been off and on. Vital signs have remained stable per staff. Pt with advanced dementia and unable to provide HPI or ROS.   Review of Systems:  Review of Systems  Unable to perform ROS: Dementia    Past Medical History  Diagnosis Date  . Hypertension   . Hyperlipidemia   . Thyroid disease     Hypothyroidism  . GERD (gastroesophageal reflux disease)   . Arthritis     Degenerative  . Peripheral vascular disease     Left   Fem-Pop and  tibial Thrombectomy  . High platelet count   . Dementia   . Pain in toe of right foot   . Hypothyroidism   . Long term (current) use of anticoagulants 03/13/2014  . Neuromuscular disorder   . Parkinson's disease    Past Surgical History  Procedure Laterality Date  . Femoral-popliteal bypass graft  2002    Left  Dr. Sherren Mocha Early  . Abdominal hysterectomy    . Cataract surgery      left eye  . Colonoscopy N/A 09/06/2014    Procedure: COLONOSCOPY;  Surgeon: Jeryl Columbia, MD;  Location: [facility]COPY;  Service: Endoscopy;  Laterality: N/A;   Social History:   reports that she has quit smoking. She has never used smokeless tobacco. She reports that she does  not drink alcohol or use illicit drugs.  Family History  Problem Relation Age of Onset  . Heart failure Mother   . Stroke Father     Medications: Patient's Medications  New Prescriptions   No medications on file  Previous Medications   ACETAMINOPHEN (TYLENOL) 325 MG TABLET    Take 650 mg by mouth daily with breakfast.   APIXABAN (ELIQUIS) 2.5 MG TABS TABLET    Take 1 tablet (2.5 mg total) by mouth 2 (two) times daily.   ATORVASTATIN (LIPITOR) 10 MG TABLET    Take 10 mg by mouth daily.   CALCIUM-VITAMIN D (OSCAL WITH D) 500-200 MG-UNIT PER TABLET    Take 1 tablet by mouth 2 (two) times daily.   CRANBERRY 250 MG CAPS    Take 250 mg by mouth daily.   HYDROXYUREA (HYDREA) 500 MG CAPSULE    Take 1,000 mg by mouth daily.    LEVOTHYROXINE (SYNTHROID, LEVOTHROID) 200 MCG TABLET    Take 200 mcg by mouth daily before breakfast.   LOSARTAN (COZAAR) 100 MG TABLET    Take 100 mg by mouth daily.   MULTIPLE VITAMINS-MINERALS (MULTI COMPLETE PO)    Take 1 tablet by mouth daily.   OMEGA-3 FATTY ACIDS (FISH OIL) 1000 MG CAPS    Take 1 capsule (1,000 mg total) by mouth daily.  SENNA-DOCUSATE (SENOKOT-S) 8.6-50 MG PER TABLET    Take 1 tablet by mouth daily.   SODIUM CHLORIDE (OCEAN) 0.65 % SOLN NASAL SPRAY    Place 1 spray into both nostrils as needed for congestion.  Modified Medications   No medications on file  Discontinued Medications   No medications on file     Physical Exam: Filed Vitals:   02/11/15 1211  BP: 125/80  Pulse: 70  Temp: 98.5 F (36.9 C)  Resp: 20    Physical Exam  Constitutional: She is oriented to person, place, and time. She appears well-developed and well-nourished. No distress.  HENT:  Head: Normocephalic and atraumatic.  Mouth/Throat: Oropharynx is clear and moist. No oropharyngeal exudate.  Eyes: Conjunctivae are normal. Pupils are equal, round, and reactive to light.  Neck: Normal range of motion. Neck supple.  Cardiovascular: Normal rate and normal heart  sounds.  An irregular rhythm present.  Pulmonary/Chest: Effort normal and breath sounds normal.  Abdominal: Soft. Bowel sounds are normal.  Genitourinary: Rectum normal. Rectal exam shows no external hemorrhoid and no internal hemorrhoid.  Pt with blood noted on stool from internal exam   Musculoskeletal: She exhibits no edema or tenderness.  Neurological: She is alert and oriented to person, place, and time.  Skin: Skin is warm and dry. She is not diaphoretic.  Psychiatric: She has a normal mood and affect.    Labs reviewed: Basic Metabolic Panel:  Recent Labs  08/31/14 1616  09/05/14 2200 09/06/14 0455 09/07/14 0644 09/08/14 0444 11/14/14 12/30/14  NA  --   < >  --  139 140 135 137 142  K  --   < >  --  3.2* 3.7 4.1 4.4 4.1  CL  --   < >  --  109 110 108  --   --   CO2  --   < >  --  26 25 20   --   --   GLUCOSE  --   < >  --  88 91 91  --   --   BUN  --   < >  --  6 11 12 12 9   CREATININE  --   < >  --  0.72 0.84 0.86 0.5 0.7  CALCIUM  --   < >  --  8.7 8.6 8.1*  --   --   MG 1.9  --  1.9  --   --   --   --   --   < > = values in this interval not displayed. Liver Function Tests:  Recent Labs  05/09/14 1331 09/01/14 0555  AST 33 32  ALT 15 6  ALKPHOS 65 51  BILITOT 0.4 0.6  PROT 7.6 7.1  ALBUMIN 3.4* 3.5   No results for input(s): LIPASE, AMYLASE in the last 8760 hours. No results for input(s): AMMONIA in the last 8760 hours. CBC:  Recent Labs  08/31/14 1000  09/05/14 0536 09/06/14 0455 09/07/14 0644 09/10/14 11/14/14 12/30/14  WBC 5.8  < > 4.8 5.2 4.9 6.5 8.0 5.2  NEUTROABS 4.1  --   --   --   --   --   --   --   HGB 11.2*  < > 10.8* 10.7* 9.8* 10.6* 13.6 12.1  HCT 34.6*  < > 32.9* 33.0* 30.9* 33* 42 38  MCV 103.9*  < > 101.5* 102.5* 102.3*  --   --   --   PLT 1034*  < > 812* 910* 757*  878* 900* 1009*  < > = values in this interval not displayed. TSH:  Recent Labs  09/06/14 1950 10/10/14 11/21/14  TSH 6.808* 10.79* 7.23*   A1C: Lab Results    Component Value Date   HGBA1C 5.7* 01/21/2014   Lipid Panel: No results for input(s): CHOL, HDL, LDLCALC, TRIG, CHOLHDL, LDLDIRECT in the last 8760 hours.   Assessment/Plan 1. Paroxysmal atrial fibrillation Currently on eliquis due to a fib, will stop this at this time due to blood noted in stool. Discussed the risk associated with this with daughter, she does not fully seem to understand risk vs benefits of blood thinners.  -will have staff set up cardiology appt at daughters request   2. Blood in stool visible blood noted in stool, pts VSS currently stable, pt at baseline mental status. Will get CBC at this time, dc eliquis due to blood in stool, start ferrous sulfate 325 mg twice daily -will have pt see GI outpt  3. Dementia -advanced, discussed plan of care however there are barriers for work up blood in stool due to advanced dementia, doubt she would be able to tolerate the prep and discussed this with daughter.   Communication: spent 15 mins talking to daughter on phone then an additional 20 mins face to face with daughter and son regarding disease, options and plan of care

## 2015-02-12 ENCOUNTER — Non-Acute Institutional Stay (SKILLED_NURSING_FACILITY): Payer: Medicare Other | Admitting: Internal Medicine

## 2015-02-12 ENCOUNTER — Encounter: Payer: Self-pay | Admitting: Internal Medicine

## 2015-02-12 DIAGNOSIS — I482 Chronic atrial fibrillation, unspecified: Secondary | ICD-10-CM

## 2015-02-12 DIAGNOSIS — R195 Other fecal abnormalities: Secondary | ICD-10-CM

## 2015-02-12 DIAGNOSIS — D473 Essential (hemorrhagic) thrombocythemia: Secondary | ICD-10-CM | POA: Diagnosis not present

## 2015-02-12 NOTE — Progress Notes (Signed)
Patient ID: Tanya Farmer, female   DOB: [birth year], 81 y.o.   MRN: [id number]    [facility]  Code Status: Full Code    Chief Complaint  Patient presents with  . Acute Visit    Family concerns     HPI 81 y/o female patient with PMH of afib chronic anticoagulation with equilis, HTN, essential thrombocytosis, severe dementia, HLD, hypothyroidism is seen today for family concerns. patient was noted to have maroon colored stool yesterday. She had guaiac test positive for blood. Her eliquis was discontinued, she was started on iron and cbc was checked. On review her hb is 12.7 and plt 1161. gi consult has been placed with dr Watt Climes for follow up on gi bleed and cardiology dr Tamala Julian for afib with her hx of bleeding. Her daughter is concerned for her mother with blood in stool and would like her hospitalized for closer monitoring. She would also like input from patient's hematologist at [facility] for her increased platelet count.   Review of system Unable to obtain from patient. No further bowel movement reported, no active bleed reported Vital signs obtained are stable   Past Medical History  Diagnosis Date  . Hypertension   . Hyperlipidemia   . Thyroid disease     Hypothyroidism  . GERD (gastroesophageal reflux disease)   . Arthritis     Degenerative  . Peripheral vascular disease     Left   Fem-Pop and  tibial Thrombectomy  . High platelet count   . Dementia   . Pain in toe of right foot   . Hypothyroidism   . Long term (current) use of anticoagulants 03/13/2014  . Neuromuscular disorder   . Parkinson's disease        Medication List       This list is accurate as of: 02/12/15  4:44 PM.  Always use your most recent med list.               acetaminophen 325 MG tablet  Commonly known as:  TYLENOL  Take 650 mg by mouth daily with breakfast.     AMBULATORY NON FORMULARY MEDICATION  Medication Name: Med Pass 120 mL by mouth daily r/t decreased weight       apixaban 2.5 MG Tabs tablet  Commonly known as:  ELIQUIS  Take 1 tablet (2.5 mg total) by mouth 2 (two) times daily.     atorvastatin 10 MG tablet  Commonly known as:  LIPITOR  Take 10 mg by mouth daily.     calcium-vitamin D 500-200 MG-UNIT per tablet  Commonly known as:  OSCAL WITH D  Take 1 tablet by mouth 2 (two) times daily.     CERTA-VITE PO  Take by mouth daily.     Cranberry 250 MG Caps  Take 250 mg by mouth daily.     Fish Oil 1000 MG Caps  Take 1 capsule (1,000 mg total) by mouth daily.     hydroxyurea 500 MG capsule  Commonly known as:  HYDREA  Take 1,000 mg by mouth daily.     levothyroxine 200 MCG tablet  Commonly known as:  SYNTHROID, LEVOTHROID  Take 200 mcg by mouth daily before breakfast.     losartan 100 MG tablet  Commonly known as:  COZAAR  Take 100 mg by mouth daily.     senna-docusate 8.6-50 MG per tablet  Commonly known as:  Senokot-S  Take 1 tablet by mouth daily.  sodium chloride 0.65 % Soln nasal spray  Commonly known as:  OCEAN  Place 1 spray into both nostrils as needed for congestion.        Physical exam   BP 169/89 mmHg  Pulse 58  Resp 18  Ht 5' (1.524 m)  Constitutional: Thin/frail elderly female in no acute distress.  HEENT: Normocephalic and atraumatic. PERRL. No scleral icterus. Neck: No lymphadenopathy. No JVD Cardiac: irregular heart rate, trace leg edema Lungs: CTAB Abdomen: Soft, nontender, nondistended.  Musculoskeletal: able to move all extremities. Generalized weakness with limited joints ROM bilaterally Skin: Warm and dry.   Neurological: Arousable  Labs Reviewed  CBC Latest Ref Rng 02/11/2015 12/30/2014 11/14/2014  WBC - 6.9 5.2 8.0  Hemoglobin 12.0 - 16.0 g/dL 12.7 12.1 13.6  Hematocrit 36 - 46 % 39 38 42  Platelets 150 - 399 K/L 1161(A) 1009(A) 900(A)    CMP Latest Ref Rng 12/30/2014 11/14/2014 09/08/2014  Glucose 70 - 99 mg/dL - - 91  BUN 4 - 21 mg/dL 9 12 12   Creatinine 0.5 - 1.1 mg/dL 0.7 0.5  0.86  Sodium 137 - 147 mmol/L 142 137 135  Potassium 3.4 - 5.3 mmol/L 4.1 4.4 4.1  Chloride 96 - 112 mEq/L - - 108  CO2 19 - 32 mmol/L - - 20  Calcium 8.4 - 10.5 mg/dL - - 8.1(L)  Total Protein 6.0 - 8.3 g/dL - - -  Albumin 3.5 - 4.7 g/dL - - -  Total Bilirubin 0.3 - 1.2 mg/dL - - -  Alkaline Phos 39 - 117 U/L - - -  AST 0 - 37 U/L - - -  ALT 0 - 35 U/L - - -    Lab Results  Component Value Date   HGBA1C 5.7* 01/21/2014    Lab Results  Component Value Date   TSH 7.23* 11/21/2014    Lipid Panel     Component Value Date/Time   CHOL 137 01/16/2014   TRIG 81 01/16/2014   LDLCALC 71 01/16/2014   Imaging 09/06/14 colonoscopy- small internal hemorrhoids, left > right diverticula   Assessment/plan  Guaiac positive stool Has history of internal hemorrhoids, her bleeding could have been from this. No frank blood in stool. She is on eliquis which could have caused upper gi bleed. Hemodynamically stable at present. Hb 12.7. Reviewed care plan with daughter and nursing supervisor, f/u on cbc. Will have follow up with dr Perley Jain office. Start protonix 40 mg daily for now  afib Was on eliquis, discontinued for now due to gi bleed. This increases risk for a stroke but with risk of intracranial bleed with the anticoagulant in patient who has gi bleed, holding off for now. Reviewed care plan with daughter and answered her question. Patient has follow up with cardiology  Essential thrombocytosis Has hx of this and followed by [place] for this. Continue hydroxurea for now. Daughter would like to get in touch with dr berkowitz's office personally for follow up. monitr cbc  Spent more than 40 minutes in patient care co-ordination, reviewed care plan with daughter and answered her question. Daughter is comfortable having patient monitored in the facility for now with q shift vitals check, close monitoring of cbc for hb and plt and outpatient follow up with gi, cardiology and  hematology  [facility], MD  [place] (Monday-Friday 8 am - 5 pm) [phone] (afterhours)

## 2015-02-23 ENCOUNTER — Other Ambulatory Visit: Payer: Self-pay

## 2015-04-01 ENCOUNTER — Non-Acute Institutional Stay (SKILLED_NURSING_FACILITY): Payer: Medicare Other | Admitting: Internal Medicine

## 2015-04-01 DIAGNOSIS — I10 Essential (primary) hypertension: Secondary | ICD-10-CM

## 2015-04-01 DIAGNOSIS — E039 Hypothyroidism, unspecified: Secondary | ICD-10-CM

## 2015-04-01 DIAGNOSIS — D473 Essential (hemorrhagic) thrombocythemia: Secondary | ICD-10-CM | POA: Diagnosis not present

## 2015-04-01 DIAGNOSIS — N390 Urinary tract infection, site not specified: Secondary | ICD-10-CM | POA: Diagnosis not present

## 2015-04-01 DIAGNOSIS — F0390 Unspecified dementia without behavioral disturbance: Secondary | ICD-10-CM

## 2015-04-01 DIAGNOSIS — I480 Paroxysmal atrial fibrillation: Secondary | ICD-10-CM

## 2015-04-01 DIAGNOSIS — E038 Other specified hypothyroidism: Secondary | ICD-10-CM

## 2015-04-01 DIAGNOSIS — F03C0 Unspecified dementia, severe, without behavioral disturbance, psychotic disturbance, mood disturbance, and anxiety: Secondary | ICD-10-CM

## 2015-04-01 NOTE — Progress Notes (Signed)
Patient ID: Tanya Farmer, female   DOB: 02-Jan-1926, 81 y.o.   MRN: [id number]     Facility: [facility]    PCP: Blanchie Serve, MD  Code Status: full code  No Known Allergies  Chief Complaint  Patient presents with  . Medical Management of Chronic Issues     HPI:  81 year old patient is seen for routine visit. No new concern from staff today. Weight overall stable. No falls reported. No new skin concern. She has advanced dementia. Unable to participate in HPI and ROS. She has hx of internal hemorrhoids, diverticulae. No recent gi bleed reported. Hb has been stable now. Also has hx of thrombocytosis and is followed by hematology from [facility] and currently on hydroxurea. Count was elevated to 14 and is now down to 9. Currently on iron supplement and also eliquis for afib. Tolerating them fair.   Review of Systems:  Unable to obtain  Past Medical History  Diagnosis Date  . Hypertension   . Hyperlipidemia   . Thyroid disease     Hypothyroidism  . GERD (gastroesophageal reflux disease)   . Arthritis     Degenerative  . Peripheral vascular disease     Left   Fem-Pop and  tibial Thrombectomy  . High platelet count   . Dementia   . Pain in toe of right foot   . Hypothyroidism   . Long term (current) use of anticoagulants 03/13/2014  . Neuromuscular disorder   . Parkinson's disease     Medications: Patient's Medications  New Prescriptions   No medications on file  Previous Medications   ACETAMINOPHEN (TYLENOL) 325 MG TABLET    Take 650 mg by mouth daily with breakfast.   AMBULATORY NON FORMULARY MEDICATION    Medication Name: Med Pass 120 mL by mouth daily r/t decreased weight   ATORVASTATIN (LIPITOR) 10 MG TABLET    Take 10 mg by mouth daily.   CALCIUM-VITAMIN D (OSCAL WITH D) 500-200 MG-UNIT PER TABLET    Take 1 tablet by mouth 2 (two) times daily.   CRANBERRY 250 MG CAPS    Take 250 mg by mouth daily.   FERROUS SULFATE 325 (65 FE) MG TABLET    Take  325 mg by mouth 2 (two) times daily with a meal.   HYDROXYUREA (HYDREA) 500 MG CAPSULE    Take 1,000 mg by mouth daily.    LEVOTHYROXINE (SYNTHROID, LEVOTHROID) 200 MCG TABLET    Take 200 mcg by mouth daily before breakfast.   LOSARTAN (COZAAR) 100 MG TABLET    Take 100 mg by mouth daily.   MULTIPLE VITAMINS-MINERALS (CERTA-VITE PO)    Take by mouth daily.   SENNA-DOCUSATE (SENOKOT-S) 8.6-50 MG PER TABLET    Take 1 tablet by mouth daily.   SODIUM CHLORIDE (OCEAN) 0.65 % SOLN NASAL SPRAY    Place 1 spray into both nostrils as needed for congestion.  Modified Medications   No medications on file  Discontinued Medications   OMEGA-3 FATTY ACIDS (FISH OIL) 1000 MG CAPS    Take 1 capsule (1,000 mg total) by mouth daily.     Physical Exam: Filed Vitals:   04/01/15 1742  BP: 153/89  Pulse: 84  Temp: 97.7 F (36.5 C)  Resp: 18  Weight: 119 lb 3.2 oz (54.069 kg)  SpO2: 96%    General- elderly frail female in no acute distress Head- normocephalic, atraumatic Throat- moist mucus membrane Neck- no cervical lymphadenopathy, no JVD, no thyromegaly  Cardiovascular- normal s1,s2, irregular heart rate, no murmurs,trace leg edema Respiratory- bilateral clear to auscultation, no wheeze, no rhonchi, no crackles, no use of accessory muscles Abdomen- bowel sounds present, soft, non tender Musculoskeletal- able to move all 4 extremities, on a recliner Skin- warm and dry Psychiatry- pleasantly confused    Labs reviewed: Basic Metabolic Panel:  Recent Labs  08/31/14 1616  09/05/14 2200 09/06/14 0455 09/07/14 0644 09/08/14 0444 11/14/14 12/30/14  NA  --   < >  --  139 140 135 137 142  K  --   < >  --  3.2* 3.7 4.1 4.4 4.1  CL  --   < >  --  109 110 108  --   --   CO2  --   < >  --  26 25 20   --   --   GLUCOSE  --   < >  --  88 91 91  --   --   BUN  --   < >  --  6 11 12 12 9   CREATININE  --   < >  --  0.72 0.84 0.86 0.5 0.7  CALCIUM  --   < >  --  8.7 8.6 8.1*  --   --   MG 1.9  --  1.9   --   --   --   --   --   < > = values in this interval not displayed. Liver Function Tests:  Recent Labs  05/09/14 1331 09/01/14 0555  AST 33 32  ALT 15 6  ALKPHOS 65 51  BILITOT 0.4 0.6  PROT 7.6 7.1  ALBUMIN 3.4* 3.5   No results for input(s): LIPASE, AMYLASE in the last 8760 hours. No results for input(s): AMMONIA in the last 8760 hours. CBC:  Recent Labs  08/31/14 1000  09/05/14 0536 09/06/14 0455 09/07/14 0644  11/14/14 12/30/14 02/11/15  WBC 5.8  < > 4.8 5.2 4.9  < > 8.0 5.2 6.9  NEUTROABS 4.1  --   --   --   --   --   --   --   --   HGB 11.2*  < > 10.8* 10.7* 9.8*  < > 13.6 12.1 12.7  HCT 34.6*  < > 32.9* 33.0* 30.9*  < > 42 38 39  MCV 103.9*  < > 101.5* 102.5* 102.3*  --   --   --   --   PLT 1034*  < > 812* 910* 757*  < > 900* 1009* 1161*  < > = values in this interval not displayed. Cardiac Enzymes:  Recent Labs  05/09/14 1333  TROPONINI <0.30   Lab Results  Component Value Date   HGBA1C 5.7* 01/21/2014     Radiological Exams: 09/01/14: MRI head: No acute intracranial infarct or other abnormalities. Advanced cerebral atrophy with chronic microvascular ischemic disease, grossly stable.   09/06/14: Colonoscopy : 1. Small internal hemorrhoids 2. Left greater than right diverticuli 3. Otherwise within normal limits to the cecum without signs of active bleeding   Assessment/Plan  afib Rate currently controlled. Continue eliquis 2.5 mg bid for now and monitor  Advanced dementia Under total care, needs assistance with feed, aspiration precautions, pressure ulcer prophylaxis. Continue mechanical soft diet  Iron def anemia Continue ferrous sulfate 325 mg bid and monitor h&h periodically  Thrombocytosis Continue hydroxurea 1000 mg daily for now and monitor count  HLD Continue lipitor 10 mg daily for now  Disuse osteoporosis Continue oscal  with vit d, fall prevention  Recurrent uti Stable, continue cranberry supplement  HTN bp elevated.  Monitor bp  daily for now. Continue losartan 100 mg daily, goal bp with her age < 150/90  Hypothyroidism Continue levothryoxine 200 mcg daily and check tsh next lab   [facility], MD  [facility] [phone] (Monday-Friday 8 am - 5 pm) [phone] (afterhours)

## 2015-04-12 ENCOUNTER — Encounter (HOSPITAL_COMMUNITY): Payer: Self-pay | Admitting: Emergency Medicine

## 2015-04-12 ENCOUNTER — Emergency Department (HOSPITAL_COMMUNITY): Payer: Medicare Other

## 2015-04-12 ENCOUNTER — Inpatient Hospital Stay (HOSPITAL_COMMUNITY)
Admission: EM | Admit: 2015-04-12 | Discharge: 2015-04-29 | DRG: 299 | Disposition: A | Discharge status: Hospice | Payer: Medicare Other | Attending: Internal Medicine | Admitting: Internal Medicine

## 2015-04-12 DIAGNOSIS — N183 Chronic kidney disease, stage 3 (moderate): Secondary | ICD-10-CM | POA: Diagnosis present

## 2015-04-12 DIAGNOSIS — D473 Essential (hemorrhagic) thrombocythemia: Secondary | ICD-10-CM | POA: Diagnosis present

## 2015-04-12 DIAGNOSIS — I480 Paroxysmal atrial fibrillation: Secondary | ICD-10-CM | POA: Diagnosis present

## 2015-04-12 DIAGNOSIS — I129 Hypertensive chronic kidney disease with stage 1 through stage 4 chronic kidney disease, or unspecified chronic kidney disease: Secondary | ICD-10-CM | POA: Diagnosis present

## 2015-04-12 DIAGNOSIS — Z66 Do not resuscitate: Secondary | ICD-10-CM | POA: Diagnosis not present

## 2015-04-12 DIAGNOSIS — F0390 Unspecified dementia without behavioral disturbance: Secondary | ICD-10-CM | POA: Diagnosis present

## 2015-04-12 DIAGNOSIS — G20 Parkinson's disease: Secondary | ICD-10-CM | POA: Diagnosis present

## 2015-04-12 DIAGNOSIS — M24561 Contracture, right knee: Secondary | ICD-10-CM | POA: Diagnosis present

## 2015-04-12 DIAGNOSIS — A4150 Gram-negative sepsis, unspecified: Secondary | ICD-10-CM | POA: Diagnosis not present

## 2015-04-12 DIAGNOSIS — Z7901 Long term (current) use of anticoagulants: Secondary | ICD-10-CM

## 2015-04-12 DIAGNOSIS — K219 Gastro-esophageal reflux disease without esophagitis: Secondary | ICD-10-CM | POA: Diagnosis present

## 2015-04-12 DIAGNOSIS — T402X5A Adverse effect of other opioids, initial encounter: Secondary | ICD-10-CM | POA: Diagnosis not present

## 2015-04-12 DIAGNOSIS — I96 Gangrene, not elsewhere classified: Secondary | ICD-10-CM

## 2015-04-12 DIAGNOSIS — Z8249 Family history of ischemic heart disease and other diseases of the circulatory system: Secondary | ICD-10-CM | POA: Diagnosis not present

## 2015-04-12 DIAGNOSIS — R7881 Bacteremia: Secondary | ICD-10-CM | POA: Diagnosis not present

## 2015-04-12 DIAGNOSIS — Z79899 Other long term (current) drug therapy: Secondary | ICD-10-CM | POA: Diagnosis not present

## 2015-04-12 DIAGNOSIS — F03C0 Unspecified dementia, severe, without behavioral disturbance, psychotic disturbance, mood disturbance, and anxiety: Secondary | ICD-10-CM | POA: Diagnosis present

## 2015-04-12 DIAGNOSIS — R21 Rash and other nonspecific skin eruption: Secondary | ICD-10-CM | POA: Diagnosis present

## 2015-04-12 DIAGNOSIS — E039 Hypothyroidism, unspecified: Secondary | ICD-10-CM | POA: Diagnosis present

## 2015-04-12 DIAGNOSIS — R946 Abnormal results of thyroid function studies: Secondary | ICD-10-CM | POA: Diagnosis present

## 2015-04-12 DIAGNOSIS — Z823 Family history of stroke: Secondary | ICD-10-CM | POA: Diagnosis not present

## 2015-04-12 DIAGNOSIS — I998 Other disorder of circulatory system: Secondary | ICD-10-CM

## 2015-04-12 DIAGNOSIS — G9340 Encephalopathy, unspecified: Secondary | ICD-10-CM | POA: Diagnosis present

## 2015-04-12 DIAGNOSIS — Z515 Encounter for palliative care: Secondary | ICD-10-CM | POA: Diagnosis not present

## 2015-04-12 DIAGNOSIS — I70261 Atherosclerosis of native arteries of extremities with gangrene, right leg: Secondary | ICD-10-CM | POA: Diagnosis not present

## 2015-04-12 DIAGNOSIS — M79671 Pain in right foot: Secondary | ICD-10-CM | POA: Diagnosis not present

## 2015-04-12 DIAGNOSIS — R4182 Altered mental status, unspecified: Secondary | ICD-10-CM

## 2015-04-12 DIAGNOSIS — M1990 Unspecified osteoarthritis, unspecified site: Secondary | ICD-10-CM | POA: Diagnosis present

## 2015-04-12 DIAGNOSIS — E785 Hyperlipidemia, unspecified: Secondary | ICD-10-CM | POA: Diagnosis present

## 2015-04-12 DIAGNOSIS — Z7189 Other specified counseling: Secondary | ICD-10-CM | POA: Diagnosis not present

## 2015-04-12 DIAGNOSIS — E038 Other specified hypothyroidism: Secondary | ICD-10-CM | POA: Diagnosis not present

## 2015-04-12 DIAGNOSIS — G4710 Hypersomnia, unspecified: Secondary | ICD-10-CM | POA: Diagnosis not present

## 2015-04-12 DIAGNOSIS — Z87440 Personal history of urinary (tract) infections: Secondary | ICD-10-CM | POA: Diagnosis not present

## 2015-04-12 DIAGNOSIS — Z87891 Personal history of nicotine dependence: Secondary | ICD-10-CM

## 2015-04-12 DIAGNOSIS — E876 Hypokalemia: Secondary | ICD-10-CM | POA: Diagnosis present

## 2015-04-12 LAB — BASIC METABOLIC PANEL
ANION GAP: 9 (ref 5–15)
BUN: 7 mg/dL (ref 6–20)
CHLORIDE: 105 mmol/L (ref 101–111)
CO2: 25 mmol/L (ref 22–32)
CREATININE: 0.69 mg/dL (ref 0.44–1.00)
Calcium: 8.9 mg/dL (ref 8.9–10.3)
GFR calc non Af Amer: >60 mL/min (ref >60)
GLUCOSE: 107 mg/dL — AB (ref 65–99)
Potassium: 3.2 mmol/L — ABNORMAL LOW (ref 3.5–5.1)
Sodium: 139 mmol/L (ref 135–145)

## 2015-04-12 LAB — CBC WITH DIFFERENTIAL/PLATELET
BASOS ABS: 0.1 10*3/uL (ref 0.0–0.1)
BASOS PCT: 2 % — AB (ref 0–1)
Eosinophils Absolute: 0.1 10*3/uL (ref 0.0–0.7)
Eosinophils Relative: 1 % (ref 0–5)
HCT: 39.4 % (ref 36.0–46.0)
Hemoglobin: 12.9 g/dL (ref 12.0–15.0)
Lymphocytes Relative: 18 % (ref 12–46)
Lymphs Abs: 1.1 10*3/uL (ref 0.7–4.0)
MCH: 31.1 pg (ref 26.0–34.0)
MCHC: 32.7 g/dL (ref 30.0–36.0)
MCV: 94.9 fL (ref 78.0–100.0)
MONOS PCT: 7 % (ref 3–12)
Monocytes Absolute: 0.4 10*3/uL (ref 0.1–1.0)
NEUTROS ABS: 4.4 10*3/uL (ref 1.7–7.7)
Neutrophils Relative %: 72 % (ref 43–77)
PLATELETS: 899 10*3/uL — AB (ref 150–400)
RBC: 4.15 MIL/uL (ref 3.87–5.11)
RDW: 19.3 % — ABNORMAL HIGH (ref 11.5–15.5)
WBC: 6.1 10*3/uL (ref 4.0–10.5)

## 2015-04-12 LAB — PROTIME-INR
INR: 1.61 — AB (ref 0.00–1.49)
PROTHROMBIN TIME: 19.1 s — AB (ref 11.6–15.2)

## 2015-04-12 LAB — APTT: aPTT: 43 seconds — ABNORMAL HIGH (ref 24–37)

## 2015-04-12 MED ORDER — MORPHINE SULFATE 2 MG/ML IJ SOLN: 0.5000 mg | INTRAMUSCULAR | Status: DC | PRN

## 2015-04-12 NOTE — ED Notes (Signed)
[place]ak[place], MD at bedside 

## 2015-04-12 NOTE — ED Notes (Signed)
Xray tech at bedside.

## 2015-04-12 NOTE — ED Provider Notes (Signed)
CSN: [id number]     Arrival date & time 04/12/15  1846 History   First MD Initiated Contact with Patient 04/12/15 1910     Chief Complaint  Patient presents with  . Wound Check   LEVEL 5 CAVEAT DUE TO DEMENTIA  Family gave verbal permission to utilize photo for medical documentation only The image was not stored on any personal device   Patient is a 81 y.o. female presenting with lower extremity pain. The history is provided by a relative. The history is limited by the condition of the patient.  Foot Pain This is a new problem. Episode onset: unknown time ago. The problem occurs constantly. The problem has been gradually worsening. Nothing aggravates the symptoms. Nothing relieves the symptoms.  pt presents from nursing facility for right foot pain Pt is nonverbal at times and has dementia so history is limited Daughter reports pt has had pain in her right foot/leg and discoloration to right little toe No falls/trauma reported but may have "rub" it in her shoe She has h/o vascular issues, and has seen dr early previously  Pt has h/o afib and is on eliquis She had DVT study performed on 8/13 that was negative  Past Medical History  Diagnosis Date  . Hypertension   . Hyperlipidemia   . Thyroid disease     Hypothyroidism  . GERD (gastroesophageal reflux disease)   . Arthritis     Degenerative  . Peripheral vascular disease     Left   Fem-Pop and  tibial Thrombectomy  . High platelet count   . Dementia   . Pain in toe of right foot   . Hypothyroidism   . Long term (current) use of anticoagulants 03/13/2014  . Neuromuscular disorder   . Parkinson's disease    Past Surgical History  Procedure Laterality Date  . Femoral-popliteal bypass graft  2002    Left  Dr. Sherren Mocha Early  . Abdominal hysterectomy    . Cataract surgery      left eye  . Colonoscopy N/A 09/06/2014    Procedure: COLONOSCOPY;  Surgeon: Jeryl Columbia, MD;  Location: [facility] [place] ENDOSCOPY;  Service: Endoscopy;  Laterality:  N/A;   Family History  Problem Relation Age of Onset  . Heart failure Mother   . Stroke Father    Social History  Substance Use Topics  . Smoking status: Former Research scientist (life sciences)  . Smokeless tobacco: Never Used  . Alcohol Use: No   OB History    No data available     Review of Systems  Unable to perform ROS: Dementia      Allergies  Review of patient's allergies indicates no known allergies.  Home Medications   Prior to Admission medications   Medication Sig Start Date End Date Taking? Authorizing Provider  acetaminophen (TYLENOL) 325 MG tablet Take 650 mg by mouth daily with breakfast.   Yes Historical Provider, MD  apixaban (ELIQUIS) 2.5 MG TABS tablet Take 2.5 mg by mouth 2 (two) times daily.   Yes Historical Provider, MD  atorvastatin (LIPITOR) 10 MG tablet Take 10 mg by mouth daily at 6 PM.    Yes Historical Provider, MD  calcium-vitamin D (OSCAL WITH D) 500-200 MG-UNIT per tablet Take 1 tablet by mouth 2 (two) times daily.   Yes Historical Provider, MD  Cranberry 250 MG CAPS Take 250 mg by mouth daily.   Yes Historical Provider, MD  ferrous sulfate 325 (65 FE) MG tablet Take 325 mg by mouth 2 (two) times daily  with a meal.   Yes Historical Provider, MD  guaiFENesin (MUCINEX) 600 MG 12 hr tablet Take 600 mg by mouth 2 (two) times daily as needed for to loosen phlegm.   Yes Historical Provider, MD  hydroxyurea (HYDREA) 500 MG capsule Take 1,000 mg by mouth daily.  10/28/10  Yes Historical Provider, MD  levothyroxine (SYNTHROID, LEVOTHROID) 200 MCG tablet Take 200 mcg by mouth daily before breakfast.   Yes Historical Provider, MD  losartan (COZAAR) 100 MG tablet Take 100 mg by mouth daily.   Yes Historical Provider, MD  Multiple Vitamins-Minerals (CERTA-VITE PO) Take 1 tablet by mouth daily.    Yes Historical Provider, MD  pantoprazole (PROTONIX) 40 MG tablet Take 40 mg by mouth daily.   Yes Historical Provider, MD  senna-docusate (SENOKOT-S) 8.6-50 MG per tablet Take 1 tablet by  mouth every evening.    Yes Historical Provider, MD  sodium chloride (OCEAN) 0.65 % SOLN nasal spray Place 1 spray into both nostrils daily.    Yes Historical Provider, MD   BP 189/109 mmHg  Pulse 123  Temp(Src) 98.3 F (36.8 C) (Oral)  Resp 22  SpO2 99% Physical Exam CONSTITUTIONAL: elderly, frail HEAD: Normocephalic/atraumatic EYES: forcibly keeping eyes closed ENMT: Mucous membranes moist NECK: supple no meningeal signs SPINE/BACK:entire spine nontender CV: taachycardic LUNGS: Lungs are clear to auscultation bilaterally, no apparent distress ABDOMEN: soft GU:no cva tenderness NEURO: Pt keep her eyes closed but is moving around in bed and will respond to name at times   EXTREMITIES:DP pulse by doppler in left foot.  Right foot is cool to touch.  Unable to obtain pulse in right foot by doppler.  See photo SKIN: cap refill delayed in right foot PSYCH: unable to assess      ED Course  Procedures  7:44 PM D/w dr fields with vascular, concern for arterial occlusion He will see patient 10:09 PM D/w dr fields He feels pt has dry gangrene due to ischemia Due to age/comorbidities, no acute intervention at this time as risk is high  recommend triad admission and palliative care consult D/w daughter/family D/w dr Hal Hope for admission  Pt stable at this time  [place] - Abnormal; Notable for the following:    Potassium 3.2 (*)    Glucose, Bld 107 (*)    All other components within normal limits  CBC WITH DIFFERENTIAL/PLATELET - Abnormal; Notable for the following:    RDW 19.3 (*)    Platelets 899 (*)    Basophils Relative 2 (*)    All other components within normal limits  PROTIME-INR - Abnormal; Notable for the following:    Prothrombin Time 19.1 (*)    INR 1.61 (*)    All other components within normal limits  APTT - Abnormal; Notable for the following:    aPTT 43 (*)    All other components within normal limits     Imaging Review Dg Foot Complete Right  04/12/2015   CLINICAL DATA:  Discoloration of the fourth and fifth toes, no known injury, initial encounter  EXAM: RIGHT FOOT COMPLETE - 3+ VIEW  COMPARISON:  None.  FINDINGS: There is no evidence of fracture or dislocation. There is no evidence of arthropathy or other focal bone abnormality. Soft tissues are unremarkable.  IMPRESSION: No acute abnormality noted.   Electronically Signed   By: Inez Catalina M.D.   On: 04/12/2015 20:28   I, Sharyon Cable, personally reviewed and evaluated these images and lab  results as part of my medical decision-making.   EKG Interpretation   Date/Time:  Sunday April 12 2015 19:16:06 EDT Ventricular Rate:  145 PR Interval:  134 QRS Duration: 72 QT Interval:  315 QTC Calculation: 489 R Axis:   -28 Text Interpretation:  Sinus tachycardia Ventricular tachycardia,  unsustained Borderline left axis deviation Low voltage, precordial leads  Baseline wander in lead(s) V2 artifact noted Confirmed by Christy Gentles  MD,  Elenore Rota ([id number]) on 04/12/2015 8:11:48 PM      MDM   Final diagnoses:  Ischemia of foot  Dry gangrene    Nursing notes including past medical history and social history reviewed and considered in documentation Labs/vital reviewed myself and considered during evaluation xrays/imaging reviewed by myself and considered during evaluation     Ripley Fraise, MD 04/12/15 2210

## 2015-04-12 NOTE — ED Notes (Signed)
RN arrived to find patient nonverbal with arms crossed over chest and pulse 130 and irregular; informed MD Wickline of patient and placed patient on cardiac monitor and obtained EKG; MD arrived to bedside soon after; family member now at bedside with MD answering questions regarding patients status and events leading up to ER visit.

## 2015-04-12 NOTE — Consult Note (Signed)
[facility] [place] HISTORY AND PHYSICAL   History of Present Illness:  Patient is a 81 y.o. year old female who presents for evaluation of right foot ischemia.  Pt noted to have black right 5th toe by daughter and brought to ER.  Pt has severe dementia and lives in SNF.  She is essentially total assist and has to be fed.  Daughter states she has gone down hill over the last week. Pt is non communicative so unknown time course and unknown if she has pain.  Pt is on Eliquis for chronic afib and daughter states she has not been refusing meds.  She also has thrombocytosis and has had right iliofemoral thrombectomy and left fempop by Dr Donnetta Hutching in  2002.  Other medical problems include hypertension hyperlipidemia, parkinson's, multiple chronic UTIs all currently stable.  Past Medical History  Diagnosis Date  . Hypertension   . Hyperlipidemia   . Thyroid disease     Hypothyroidism  . GERD (gastroesophageal reflux disease)   . Arthritis     Degenerative  . Peripheral vascular disease     Left   Fem-Pop and  tibial Thrombectomy  . High platelet count   . Dementia   . Pain in toe of right foot   . Hypothyroidism   . Long term (current) use of anticoagulants 03/13/2014  . Neuromuscular disorder   . Parkinson's disease     Past Surgical History  Procedure Laterality Date  . Femoral-popliteal bypass graft  2002    Left  Dr. Sherren Mocha Early  . Abdominal hysterectomy    . Cataract surgery      left eye  . Colonoscopy N/A 09/06/2014    Procedure: COLONOSCOPY;  Surgeon: Jeryl Columbia, MD;  Location: [facility] ENDOSCOPY;  Service: Endoscopy;  Laterality: N/A;    Social History Social History  Substance Use Topics  . Smoking status: Former Research scientist (life sciences)  . Smokeless tobacco: Never Used  . Alcohol Use: No    Family History Family History  Problem Relation Age of Onset  . Heart failure Mother   . Stroke Father     Allergies  No Known Allergies   No current facility-administered  medications for this encounter.   Current Outpatient Prescriptions  Medication Sig Dispense Refill  . acetaminophen (TYLENOL) 325 MG tablet Take 650 mg by mouth daily with breakfast.    . apixaban (ELIQUIS) 2.5 MG TABS tablet Take 2.5 mg by mouth 2 (two) times daily.    Marland Kitchen atorvastatin (LIPITOR) 10 MG tablet Take 10 mg by mouth daily at 6 PM.     . calcium-vitamin D (OSCAL WITH D) 500-200 MG-UNIT per tablet Take 1 tablet by mouth 2 (two) times daily.    . Cranberry 250 MG CAPS Take 250 mg by mouth daily.    . ferrous sulfate 325 (65 FE) MG tablet Take 325 mg by mouth 2 (two) times daily with a meal.    . guaiFENesin (MUCINEX) 600 MG 12 hr tablet Take 600 mg by mouth 2 (two) times daily as needed for to loosen phlegm.    . hydroxyurea (HYDREA) 500 MG capsule Take 1,000 mg by mouth daily.     Marland Kitchen levothyroxine (SYNTHROID, LEVOTHROID) 200 MCG tablet Take 200 mcg by mouth daily before breakfast.    . losartan (COZAAR) 100 MG tablet Take 100 mg by mouth daily.    . Multiple Vitamins-Minerals (CERTA-VITE PO) Take 1 tablet by mouth daily.     . pantoprazole (PROTONIX) 40 MG  tablet Take 40 mg by mouth daily.    Marland Kitchen senna-docusate (SENOKOT-S) 8.6-50 MG per tablet Take 1 tablet by mouth every evening.     . sodium chloride (OCEAN) 0.65 % SOLN nasal spray Place 1 spray into both nostrils daily.       ROS:   Unable to obtain secondary to dementia   Physical Examination  Filed Vitals:   04/12/15 1858 04/12/15 1915 04/12/15 2007  BP: 167/98 189/109   Pulse: 114 123   Temp: 98.3 F (36.8 C)  99.8 F (37.7 C)  TempSrc: Oral  Rectal  Resp: 18 22   SpO2: 99% 99%     There is no weight on file to calculate BMI.  General:  Alert and oriented x 0, opens eyes only to vigorous stimulus HEENT: Normal Pulmonary: Clear to auscultation bilaterally Cardiac: Irregular Abdomen: Soft, non-tender, non-distended Skin: No rash, dry gangrene right 5th toe Extremity Pulses:  2+ radial, brachial, femoral,absent  dorsalis pedis, posterior tibial pulses bilaterally Musculoskeletal: No deformity or edema  Neurologic: Upper and lower extremity motor does not follow commands moves little   ASSESSMENT:  Right foot ischemia acute on chronic.  Dry gangrene of toe suggests this has been going on for several weeks and the fact she is on Eliquis and lessens likelihood that this is embolic   PLAN:  Pt not a candidate for bypass.  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  She would be a candidate for above knee amputation for palliation but I have suggested to family we should pursue Palliative Care/Hospice.  I do not think she would ever get out of the hospital if she has an amputation as most likely would develop post op pneumonia from aspiration  The family is in agreement with DNR status at this point.  Although the foot is ischemic I do not believe it is causing sepsis or metabolic derangements currently  Would continue eliquis for now unless family opts for amputation   Ruta Hinds, MD [facility] [place]: [phone] Pager: [phone]

## 2015-04-12 NOTE — ED Notes (Signed)
Per EMS: pt from [facility] here for eval of chronic wound and possible color change to right foot; pt no ambulatory and has advanced dementia; pt with hx of DVT

## 2015-04-12 NOTE — ED Notes (Signed)
Pt has negative doppler report done yesterday

## 2015-04-13 DIAGNOSIS — Z515 Encounter for palliative care: Secondary | ICD-10-CM | POA: Insufficient documentation

## 2015-04-13 DIAGNOSIS — I480 Paroxysmal atrial fibrillation: Secondary | ICD-10-CM

## 2015-04-13 DIAGNOSIS — D473 Essential (hemorrhagic) thrombocythemia: Secondary | ICD-10-CM

## 2015-04-13 DIAGNOSIS — F0390 Unspecified dementia without behavioral disturbance: Secondary | ICD-10-CM

## 2015-04-13 DIAGNOSIS — Z7189 Other specified counseling: Secondary | ICD-10-CM | POA: Insufficient documentation

## 2015-04-13 LAB — CBC WITH DIFFERENTIAL/PLATELET
BASOS ABS: 0.1 10*3/uL (ref 0.0–0.1)
Basophils Relative: 2 % — ABNORMAL HIGH (ref 0–1)
Eosinophils Absolute: 0.1 10*3/uL (ref 0.0–0.7)
Eosinophils Relative: 1 % (ref 0–5)
HEMATOCRIT: 35.6 % — AB (ref 36.0–46.0)
HEMOGLOBIN: 11.3 g/dL — AB (ref 12.0–15.0)
LYMPHS ABS: 1 10*3/uL (ref 0.7–4.0)
LYMPHS PCT: 18 % (ref 12–46)
MCH: 30.2 pg (ref 26.0–34.0)
MCHC: 31.7 g/dL (ref 30.0–36.0)
MCV: 95.2 fL (ref 78.0–100.0)
Monocytes Absolute: 0.3 10*3/uL (ref 0.1–1.0)
Monocytes Relative: 6 % (ref 3–12)
NEUTROS ABS: 4.2 10*3/uL (ref 1.7–7.7)
NEUTROS PCT: 73 % (ref 43–77)
Platelets: 879 10*3/uL — ABNORMAL HIGH (ref 150–400)
RBC: 3.74 MIL/uL — AB (ref 3.87–5.11)
RDW: 19.3 % — ABNORMAL HIGH (ref 11.5–15.5)
WBC: 5.7 10*3/uL (ref 4.0–10.5)

## 2015-04-13 LAB — COMPREHENSIVE METABOLIC PANEL
ALT: 17 U/L (ref 14–54)
ANION GAP: 12 (ref 5–15)
AST: 27 U/L (ref 15–41)
Albumin: 2.7 g/dL — ABNORMAL LOW (ref 3.5–5.0)
Alkaline Phosphatase: 53 U/L (ref 38–126)
BILIRUBIN TOTAL: 0.6 mg/dL (ref 0.3–1.2)
BUN: 8 mg/dL (ref 6–20)
CHLORIDE: 103 mmol/L (ref 101–111)
CO2: 25 mmol/L (ref 22–32)
Calcium: 8.8 mg/dL — ABNORMAL LOW (ref 8.9–10.3)
Creatinine, Ser: 0.72 mg/dL (ref 0.44–1.00)
GFR calc Af Amer: >60 mL/min (ref >60)
Glucose, Bld: 95 mg/dL (ref 65–99)
POTASSIUM: 3.2 mmol/L — AB (ref 3.5–5.1)
Sodium: 140 mmol/L (ref 135–145)
TOTAL PROTEIN: 6.6 g/dL (ref 6.5–8.1)

## 2015-04-13 LAB — GLUCOSE, CAPILLARY
GLUCOSE-CAPILLARY: 110 mg/dL — AB (ref 65–99)
GLUCOSE-CAPILLARY: 168 mg/dL — AB (ref 65–99)
GLUCOSE-CAPILLARY: 189 mg/dL — AB (ref 65–99)
GLUCOSE-CAPILLARY: 94 mg/dL (ref 65–99)
Glucose-Capillary: 106 mg/dL — ABNORMAL HIGH (ref 65–99)

## 2015-04-13 LAB — MRSA PCR SCREENING: MRSA by PCR: NEGATIVE

## 2015-04-13 MED ORDER — APIXABAN 2.5 MG PO TABS
2.5000 mg | ORAL_TABLET | Freq: Two times a day (BID) | ORAL | Status: DC
  Administered 2015-04-13 – 2015-04-14 (×3): 2.5 mg via ORAL
  Filled 2015-04-13 (×7): qty 1

## 2015-04-13 MED ORDER — CERTA-VITE PO LIQD
5.0000 mL | Freq: Every day | ORAL | Status: DC
  Administered 2015-04-14: 5 mL via ORAL
  Filled 2015-04-13 (×11): qty 5

## 2015-04-13 MED ORDER — SENNOSIDES-DOCUSATE SODIUM 8.6-50 MG PO TABS
1.0000 | ORAL_TABLET | Freq: Every evening | ORAL | Status: DC
  Administered 2015-04-21 – 2015-04-23 (×3): 1 via ORAL
  Filled 2015-04-13 (×6): qty 1

## 2015-04-13 MED ORDER — ONDANSETRON HCL 4 MG PO TABS: 4.0000 mg | ORAL_TABLET | Freq: Four times a day (QID) | ORAL | Status: DC | PRN

## 2015-04-13 MED ORDER — POTASSIUM CHLORIDE CRYS ER 20 MEQ PO TBCR
20.0000 meq | EXTENDED_RELEASE_TABLET | Freq: Once | ORAL | Status: AC
  Administered 2015-04-13: 20 meq via ORAL
  Filled 2015-04-13: qty 1

## 2015-04-13 MED ORDER — ACETAMINOPHEN 650 MG RE SUPP: 650.0000 mg | Freq: Four times a day (QID) | RECTAL | Status: DC | PRN

## 2015-04-13 MED ORDER — SALINE SPRAY 0.65 % NA SOLN
1.0000 | Freq: Every day | NASAL | Status: DC
  Administered 2015-04-15 – 2015-04-29 (×12): 1 via NASAL
  Filled 2015-04-13 (×2): qty 44

## 2015-04-13 MED ORDER — SODIUM CHLORIDE 0.9 % IV SOLN
INTRAVENOUS | Status: DC
  Administered 2015-04-13: 01:00:00 via INTRAVENOUS

## 2015-04-13 MED ORDER — HYDROXYUREA 500 MG PO CAPS
1000.0000 mg | ORAL_CAPSULE | Freq: Every day | ORAL | Status: DC
  Administered 2015-04-14 – 2015-04-27 (×3): 1000 mg via ORAL
  Filled 2015-04-13 (×19): qty 2

## 2015-04-13 MED ORDER — ONDANSETRON HCL 4 MG/2ML IJ SOLN: 4.0000 mg | Freq: Four times a day (QID) | INTRAMUSCULAR | Status: DC | PRN

## 2015-04-13 MED ORDER — CETYLPYRIDINIUM CHLORIDE 0.05 % MT LIQD
7.0000 mL | Freq: Two times a day (BID) | OROMUCOSAL | Status: DC
  Administered 2015-04-13 – 2015-04-29 (×26): 7 mL via OROMUCOSAL

## 2015-04-13 MED ORDER — ACETAMINOPHEN 325 MG PO TABS
650.0000 mg | ORAL_TABLET | Freq: Four times a day (QID) | ORAL | Status: DC | PRN
  Administered 2015-04-13: 650 mg via ORAL
  Filled 2015-04-13 (×2): qty 2

## 2015-04-13 MED ORDER — CALCIUM CARBONATE-VITAMIN D 500-200 MG-UNIT PO TABS
1.0000 | ORAL_TABLET | Freq: Two times a day (BID) | ORAL | Status: DC
  Administered 2015-04-14 – 2015-04-29 (×11): 1 via ORAL
  Filled 2015-04-13 (×25): qty 1

## 2015-04-13 MED ORDER — PANTOPRAZOLE SODIUM 40 MG PO TBEC
40.0000 mg | DELAYED_RELEASE_TABLET | Freq: Every day | ORAL | Status: DC
  Administered 2015-04-14: 40 mg via ORAL
  Filled 2015-04-13 (×4): qty 1

## 2015-04-13 MED ORDER — POTASSIUM CHLORIDE 10 MEQ/100ML IV SOLN
10.0000 meq | INTRAVENOUS | Status: AC
  Administered 2015-04-13 (×3): 10 meq via INTRAVENOUS
  Filled 2015-04-13 (×3): qty 100

## 2015-04-13 MED ORDER — ATORVASTATIN CALCIUM 10 MG PO TABS
10.0000 mg | ORAL_TABLET | Freq: Every day | ORAL | Status: DC
  Administered 2015-04-21 – 2015-04-28 (×6): 10 mg via ORAL
  Filled 2015-04-13 (×6): qty 1

## 2015-04-13 MED ORDER — LEVOTHYROXINE SODIUM 200 MCG PO TABS
200.0000 ug | ORAL_TABLET | Freq: Every day | ORAL | Status: DC
  Administered 2015-04-14: 200 ug via ORAL
  Filled 2015-04-13 (×3): qty 1

## 2015-04-13 MED ORDER — GUAIFENESIN ER 600 MG PO TB12: 600.0000 mg | ORAL_TABLET | Freq: Two times a day (BID) | ORAL | Status: DC | PRN

## 2015-04-13 MED ORDER — FERROUS SULFATE 325 (65 FE) MG PO TABS
325.0000 mg | ORAL_TABLET | Freq: Two times a day (BID) | ORAL | Status: DC
  Administered 2015-04-14 – 2015-04-29 (×8): 325 mg via ORAL
  Filled 2015-04-13 (×16): qty 1

## 2015-04-13 MED ORDER — LOSARTAN POTASSIUM 50 MG PO TABS
100.0000 mg | ORAL_TABLET | Freq: Every day | ORAL | Status: DC
  Administered 2015-04-14: 100 mg via ORAL
  Filled 2015-04-13 (×4): qty 2

## 2015-04-13 NOTE — Evaluation (Signed)
Physical Therapy Evaluation Patient Details Name: LEEANN BADY MRN: [id number] DOB: 23-Jun-1926 Today's Date: 04/13/2015   History of Present Illness  RICHELL CORKER is a 81 y.o. female with history of atrial fibrillation on the Crixivan, chronic kidney disease stage III, hypertension, hypothyroidism and advanced dementia was brought to the ER after patient was found to have darkening of her right foot fifth toe.  Past Medical History  Diagnosis Date  . Hypertension   . Hyperlipidemia   . Thyroid disease     Hypothyroidism  . GERD (gastroesophageal reflux disease)   . Arthritis     Degenerative  . Peripheral vascular disease     Left   Fem-Pop and  tibial Thrombectomy  . High platelet count   . Dementia   . Pain in toe of right foot   . Hypothyroidism   . Long term (current) use of anticoagulants 03/13/2014  . Neuromuscular disorder   . Parkinson's disease    Past Surgical History  Procedure Laterality Date  . Femoral-popliteal bypass graft  2002    Left  Dr. Sherren Mocha Early  . Abdominal hysterectomy    . Cataract surgery      left eye  . Colonoscopy N/A 09/06/2014    Procedure: COLONOSCOPY;  Surgeon: Jeryl Columbia, MD;  Location: [facility] ENDOSCOPY;  Service: Endoscopy;  Laterality: N/A;     Clinical Impression  Patient evaluated by Physical Therapy with no further acute PT needs identified as pt is unable to participate in therapy.  Goals of Care have shifted towards Hospice. PT is signing off. Thank you for this referral.     Follow Up Recommendations No PT follow up    Equipment Recommendations  Hospital bed (air mattress overly)    Recommendations for Other Services       Precautions / Restrictions Precautions Precautions: Fall      Mobility  Bed Mobility Overal bed mobility: Needs Assistance;+2 for physical assistance Bed Mobility: Supine to Sit;Sit to Supine     Supine to sit: +2 for physical assistance;Total assist Sit to supine: +2 for physical assistance;Total  assist   General bed mobility comments: Total assist for all bed mobility; noted tendency to lean posteriorly at initial sit  Transfers                 General transfer comment: Did not opt to transfer pt up, given she is unable to participate; recommend Nursing staff use lift for OOB  Ambulation/Gait                Stairs            Wheelchair Mobility    Modified Rankin (Stroke Patients Only)       Balance Overall balance assessment: Needs assistance Sitting-balance support: No upper extremity supported;Feet supported Sitting balance-Leahy Scale: Zero Sitting balance - Comments: Heavy posterior lean initially with sitting up, even pushing backwards with trunk and hip extension; after a moment, the extension tone eased down, and Ms. Strassner was able to sit upright with moderate assist, and support at back; Attempted wiping face with wet washcloth with hand over hand asist while sitting up; did not open eyes during any of this activity Postural control: Posterior lean                                   Pertinent Vitals/Pain Pain Assessment:  (No overt indicators of pain)  Home Living Family/patient expects to be discharged to:: Skilled nursing facility                      Prior Function Level of Independence: Needs assistance   Gait / Transfers Assistance Needed: per chart review, recently pt has been essentially bedbound  ADL's / Homemaking Assistance Needed: Pt dependent with ADL's.        Hand Dominance        Extremity/Trunk Assessment   Upper Extremity Assessment:  (Resistence to passive motion noted; Able to move UEs to face to scratch an itch)           Lower Extremity Assessment:  (Significant resistence to passive movement throughout Bil LEs; Right knee with apparent flexion contracture; did not note grimace while moving RLE; noted positive rubor of dependence RLE)         Communication   Communication:  Other (comment) (non-communicative)  Cognition Arousal/Alertness: Lethargic Behavior During Therapy:  (Non-interactive) Overall Cognitive Status: No family/caregiver present to determine baseline cognitive functioning                      General Comments General comments (skin integrity, edema, etc.): Noted rubor of dependence R foot while sitting up; noted alos redness pt's neck and face    Exercises        Assessment/Plan    PT Assessment Patent does not need any further PT services  PT Diagnosis Generalized weakness;Altered mental status   PT Problem List    PT Treatment Interventions     PT Goals (Current goals can be found in the Care Plan section) Acute Rehab PT Goals Patient Stated Goal: unable to state PT Goal Formulation: All assessment and education complete, DC therapy    Frequency     Barriers to discharge        Co-evaluation               End of Session Equipment Utilized During Treatment: Other (comment) (bed pad to cradle hips during transitions) Activity Tolerance: Patient limited by lethargy Patient left: in bed;with call bell/phone within reach (in L semi-sidelying) Nurse Communication: Mobility status         Time: 8875-7972 PT Time Calculation (min) (ACUTE ONLY): 15 min   Charges:   PT Evaluation $Initial PT Evaluation Tier I: 1 Procedure     PT G CodesQuin Hoop 04/13/2015, 4:17 PM  Roney Marion, [place] Pager [phone] Office [phone]

## 2015-04-13 NOTE — Clinical Social Work Note (Signed)
CSW talked with patient's daughter, Lance Sell and her husband regarding discharge planning. Comfort care, Palliative Care, Hospice and patient returning to SNF discussed (full assessment to follow). Patient is from [facility], however the discharge plan is undecided at this point. CSW will continue to follow and assist as needed with appropriate d/c plan for patient.   Tanya Farmer, MSW, LCSW Licensed Clinical Social Worker [place] [phone]

## 2015-04-13 NOTE — Progress Notes (Signed)
Eyes open this morning Still not really interactive Does not speak Right foot essentially unchanged  I informed Dr Donnetta Hutching of pt admission at family request. Call if questions  Ruta Hinds, MD [facility] [place] Office: [phone] Pager: [phone]

## 2015-04-13 NOTE — Progress Notes (Signed)
Patient ID: Tanya Farmer, female   DOB: February 11, 1926, 81 y.o.   MRN: [id number] Patient is seen today. Well-known to me from revascularization of her left leg approximately 15 years ago. She has had marked clinical deterioration. She is a not able to communicate at all today. Is not awakened by my exam. Appears to have a contracture of her right leg. Does have dry gangrenous changes of her fourth and fifth toes with no evidence of infection. I will attempt to speak with her daughter as well. Clearly not a candidate for revascularization. No need to proceed with amputation currently since she is not apparently having any pain from this and is not having any evidence of infection. Agree quite appropriate to discuss palliative care.

## 2015-04-13 NOTE — Evaluation (Signed)
Clinical/Bedside Swallow Evaluation Patient Details  Name: Tanya Farmer MRN: [id number] Date of Birth: [birth year]  Today's Date: 04/13/2015 Time: SLP Start Time (ACUTE ONLY): 0940 SLP Stop Time (ACUTE ONLY): 1003 SLP Time Calculation (min) (ACUTE ONLY): 23 min  Past Medical History:  Past Medical History  Diagnosis Date  . Hypertension   . Hyperlipidemia   . Thyroid disease     Hypothyroidism  . GERD (gastroesophageal reflux disease)   . Arthritis     Degenerative  . Peripheral vascular disease     Left   Fem-Pop and  tibial Thrombectomy  . High platelet count   . Dementia   . Pain in toe of right foot   . Hypothyroidism   . Long term (current) use of anticoagulants 03/13/2014  . Neuromuscular disorder   . Parkinson's disease    Past Surgical History:  Past Surgical History  Procedure Laterality Date  . Femoral-popliteal bypass graft  2002    Left  Dr. Sherren Mocha Early  . Abdominal hysterectomy    . Cataract surgery      left eye  . Colonoscopy N/A 09/06/2014    Procedure: COLONOSCOPY;  Surgeon: Jeryl Columbia, MD;  Location: [facility] ENDOSCOPY;  Service: Endoscopy;  Laterality: N/A;   HPI:  Tanya Farmer is a 81 y.o. female with history of atrial fibrillation, chronic kidney disease stage III, hypertension, hypothyroidism and advanced dementia was brought to the ER after patient was found to have darkening of her right foot fifth toe. No recent CXR.  08/2014 with cognitive based dysphagia with recommendations for Dys 3 and thin liquids   Assessment / Plan / Recommendation Clinical Impression  Pt's daughter at bedside assissting with breakfast. Mild-moderate oral and pharyngeal dysphagia exhibited. Prolonged mastication with textures higher than puree and suspect delayed swallow initiation. No cough or throat clear. No vocalizations present. Daughter needed verbal and visual cues to recognize pt's delayed pharyngeal swallow and increased rate with acceptance of feedback. Recomend downgrade to  Dys 1 and continue thin. Initiated education re: progression of dysphagia with pt's having dementia, however uncertain of daughter's processing of excessive information. ST will follow up.      Aspiration Risk   (mod-severe)    Diet Recommendation Dysphagia 1 (Puree);Thin   Medication Administration: Crushed with puree Compensations: Slow rate;Small sips/bites    Other  Recommendations Oral Care Recommendations: Oral care BID   Follow Up Recommendations       Frequency and Duration min 2x/week  2 weeks   Pertinent Vitals/Pain None observed         Swallow Study           Oral/Motor/Sensory Function Overall Oral Motor/Sensory Function:  (pt unable)   Ice Chips Ice chips: Not tested   Thin Liquid Thin Liquid: Impaired Presentation: Cup Pharyngeal  Phase Impairments: Suspected delayed Swallow    Nectar Thick Nectar Thick Liquid: Not tested   Honey Thick Honey Thick Liquid: Not tested   Puree Puree: Impaired Oral Phase Impairments: Reduced labial seal;Impaired anterior to posterior transit Oral Phase Functional Implications: Prolonged oral transit;Oral residue Pharyngeal Phase Impairments: Suspected delayed Swallow   Solid   GO    Solid:  (not assessed, breakfast foods were soft)       Mick Sell, Orbie Pyo 04/13/2015,10:28 AM  Orbie Pyo Colvin Caroli.[employer] [phone]

## 2015-04-13 NOTE — ED Notes (Signed)
Provided patient and family with apple sauce and apple juice for patient prior to departing ER

## 2015-04-13 NOTE — Consult Note (Signed)
Consultation Note Date: 04/13/2015   Patient Name: Tanya Farmer  DOB: [birth year]  MRN: [id number]  Age / Sex: 81 y.o., female   PCP: Blanchie Serve, MD Referring Physician: Oswald Hillock, MD  Reason for Consultation: Establishing goals of care  Palliative Care Assessment and Plan Summary of Established Goals of Care and Medical Treatment Preferences   Patient is a 81 y.o. year old female who presents for evaluation of right foot ischemia. Pt noted to have black right 5th toe by daughter at her nursing facility [facility] and was brought to ER. Pt has severe dementia and lives in SNF. She is essentially total assist and has to be fed. Daughter stated that  she had gone down hill over the last week. Pt is non verbal at baseline.  Pt is on Eliquis for chronic afib and daughter stated she has not been refusing meds. She also has thrombocytosis and has had right iliofemoral thrombectomy and left fempop by Dr Donnetta Hutching in 2002. Other medical problems include hypertension hyperlipidemia, parkinson's, multiple chronic UTIs.  Patient has been admitted to the hospitalist service, she has been seen and evaluated by vascular surgery. It has been deemed that the patient is not a candidate for bypass, additionally, it has been deemed that the risks of pursuing surgical measures such as amputation would far outweigh benefits given her dementia and recent sub acute decline.   Hence, a palliative consult has been placed for further discussions. The patient is resting in bed, she is in no distress, she opens eyes, but does not verbalize. At times, she elevates her RLE off the bed and appears to be mildly wincing/grimacing.   Discussed extensively in detail with the patient's daughter and son in law at the bedside. [place] nearer to her daughter in [place], [place]. She has been at [facility] for rehab since early 2015. She was seen by palliative  services in January 2016 in the context of a hospitalization. The patient's dementia has progressed in the past few months to where she is mostly non ambulatory, oral intake has declined. The patient's daughter has been her arduous caregiver and biggest advocate. It was noted that the patient had black toe for which topical treatments were being done. The patient was brought to the ED at the insistence of the daughter.   The patient's daughter is tearful, distraught and feels guilty on many accounts with regards to the patient's current condition. Discussed in detail about the risks involved with anaesthesia in patient's with advanced dementia. Discussed patient's brain MRI result from January 2016 showing markedly advanced cerebral atrophy.   DNR DNI discussed in detail, daughter agreeable.  Discussed safe and medically appropriate disposition options, discussed hospice services in detail.  Palliative will continue to follow along and help guide decision making.   Contacts/Participants in Discussion: Primary Decision Maker:    Patient's daughter Lance Sell at [phone]  HCPOA: yes     Code Status/Advance Care Planning:  After extensive discussions, established DNR DNI. All questions answered.   Symptom Management:   Discussed judicious use of opioids and benzodiazepines.   Palliative Prophylaxis: yes  Additional Recommendations (Limitations, Scope, Preferences):  Care management consult, Hospice consult.   Family exploring most appropriate means of D/C:  1. Back to [facility] with hospice or 2. New SNF with hospice or 3. Home with hospice ( they need more information about combining CAP services with hospice  at home) or 4. Hospice facility like [facility]. Discussed extensively about discharge options.   Psycho-social/Spiritual:   Support System: strong, daughter.   Desire for further Chaplaincy support:no  Prognosis: < 6  months  Discharge Planning:   pending further clinical course, discussions. care management consulted. family willing to explore:     SNF with hospice versus inpatient hospice versus home with hospice. Care management consult placed for Hospice discussions.    Values: DNR established, leaning towards comfort measures, addition of hospice services.  Life limiting illness: R foot ischemia acute on chronic, patient with dementia, gradual decline.       Chief Complaint/History of Present Illness: black R foot 5th toe.   Primary Diagnoses  Present on Admission:  . Dry gangrene . Ischemia of foot . Advanced dementia . Essential thrombocytosis . Paroxysmal atrial fibrillation . Dyslipidemia . Hypothyroidism  Palliative Review of Systems: Completed.  Possible constipation Anorexia, probably dysphagia  I have reviewed the medical record, interviewed the patient and family, and examined the patient. The following aspects are pertinent.  Past Medical History  Diagnosis Date  . Hypertension   . Hyperlipidemia   . Thyroid disease     Hypothyroidism  . GERD (gastroesophageal reflux disease)   . Arthritis     Degenerative  . Peripheral vascular disease     Left   Fem-Pop and  tibial Thrombectomy  . High platelet count   . Dementia   . Pain in toe of right foot   . Hypothyroidism   . Long term (current) use of anticoagulants 03/13/2014  . Neuromuscular disorder   . Parkinson's disease    Social History   Social History  . Marital Status: Divorced    Spouse Name: N/A  . Number of Children: N/A  . Years of Education: N/A   Social History Main Topics  . Smoking status: Former Research scientist (life sciences)  . Smokeless tobacco: Never Used  . Alcohol Use: No  . Drug Use: No  . Sexual Activity: No   Other Topics Concern  . 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   Family History  Problem Relation Age of Onset  .  Heart failure Mother   . Stroke Father    Scheduled Meds: . antiseptic oral rinse  7 mL Mouth Rinse BID  . apixaban  2.5 mg Oral BID  . atorvastatin  10 mg Oral q1800  . calcium-vitamin D  1 tablet Oral BID  . ferrous sulfate  325 mg Oral BID WC  . hydroxyurea  1,000 mg Oral Q breakfast  . levothyroxine  200 mcg Oral QAC breakfast  . losartan  100 mg Oral Daily  . multivitamin with minerals  5 mL Oral Daily  . pantoprazole  40 mg Oral Daily  . senna-docusate  1 tablet Oral QPM  . sodium chloride  1 spray Each Nare Daily   Continuous Infusions: . sodium chloride 10 mL/hr at 04/13/15 0042   PRN Meds:.acetaminophen **OR** acetaminophen, guaiFENesin, ondansetron **OR** ondansetron (ZOFRAN) IV Medications Prior to Admission:  Prior to Admission medications   Medication Sig Start Date End Date Taking? Authorizing Provider  acetaminophen (TYLENOL) 325 MG tablet Take 650 mg by mouth daily with breakfast.   Yes Historical Provider, MD  apixaban (ELIQUIS) 2.5 MG TABS tablet Take 2.5 mg by mouth 2 (two) times daily.   Yes Historical Provider, MD  atorvastatin (LIPITOR) 10 MG  tablet Take 10 mg by mouth daily at 6 PM.    Yes Historical Provider, MD  calcium-vitamin D (OSCAL WITH D) 500-200 MG-UNIT per tablet Take 1 tablet by mouth 2 (two) times daily.   Yes Historical Provider, MD  Cranberry 250 MG CAPS Take 250 mg by mouth daily.   Yes Historical Provider, MD  ferrous sulfate 325 (65 FE) MG tablet Take 325 mg by mouth 2 (two) times daily with a meal.   Yes Historical Provider, MD  guaiFENesin (MUCINEX) 600 MG 12 hr tablet Take 600 mg by mouth 2 (two) times daily as needed for to loosen phlegm.   Yes Historical Provider, MD  hydroxyurea (HYDREA) 500 MG capsule Take 1,000 mg by mouth daily.  10/28/10  Yes Historical Provider, MD  levothyroxine (SYNTHROID, LEVOTHROID) 200 MCG tablet Take 200 mcg by mouth daily before breakfast.   Yes Historical Provider, MD  losartan (COZAAR) 100 MG tablet Take 100  mg by mouth daily.   Yes Historical Provider, MD  Multiple Vitamins-Minerals (CERTA-VITE PO) Take 1 tablet by mouth daily.    Yes Historical Provider, MD  pantoprazole (PROTONIX) 40 MG tablet Take 40 mg by mouth daily.   Yes Historical Provider, MD  senna-docusate (SENOKOT-S) 8.6-50 MG per tablet Take 1 tablet by mouth every evening.    Yes Historical Provider, MD  sodium chloride (OCEAN) 0.65 % SOLN nasal spray Place 1 spray into both nostrils daily.    Yes Historical Provider, MD   No Known Allergies CBC:    Component Value Date/Time   WBC 5.7 04/13/2015 0427   WBC 6.9 02/11/2015   HGB 11.3* 04/13/2015 0427   HCT 35.6* 04/13/2015 0427   PLT 879* 04/13/2015 0427   MCV 95.2 04/13/2015 0427   NEUTROABS 4.2 04/13/2015 0427   NEUTROABS 3.5 01/21/2014 1123   LYMPHSABS 1.0 04/13/2015 0427   LYMPHSABS 0.9 01/21/2014 1123   MONOABS 0.3 04/13/2015 0427   EOSABS 0.1 04/13/2015 0427   EOSABS 0.1 01/21/2014 1123   BASOSABS 0.1 04/13/2015 0427   BASOSABS 0.1 01/21/2014 1123   Comprehensive Metabolic Panel:    Component Value Date/Time   NA 140 04/13/2015 0427   NA 142 12/30/2014   K 3.2* 04/13/2015 0427   CL 103 04/13/2015 0427   CO2 25 04/13/2015 0427   BUN 8 04/13/2015 0427   BUN 9 12/30/2014   CREATININE 0.72 04/13/2015 0427   CREATININE 0.7 12/30/2014   GLUCOSE 95 04/13/2015 0427   GLUCOSE 85 01/21/2014 1123   CALCIUM 8.8* 04/13/2015 0427   AST 27 04/13/2015 0427   ALT 17 04/13/2015 0427   ALKPHOS 53 04/13/2015 0427   BILITOT 0.6 04/13/2015 0427   PROT 6.6 04/13/2015 0427   PROT 7.0 01/21/2014 1123   ALBUMIN 2.7* 04/13/2015 0427    Physical Exam: Vital Signs: BP 157/98 mmHg  Pulse 116  Temp(Src) 98.4 F (36.9 C) (Axillary)  Resp 18  Wt 51 kg (112 lb 7 oz)  SpO2 100% SpO2: SpO2: 100 % O2 Device: O2 Device: Not Delivered O2 Flow Rate:   Intake/output summary:  Intake/Output Summary (Last 24 hours) at 04/13/15 1310 Last data filed at 04/13/15 0900  Gross per 24  hour  Intake    173 ml  Output      0 ml  Net    173 ml   LBM: Last BM Date:  (unable to obtain from pt nor daughter) Baseline Weight: Weight: 51 kg (112 lb 7 oz) Most recent weight: Weight: 51 kg (  112 lb 7 oz)  Exam Findings:  Weak frail elderly AA lady NAD Clear anteriorly S1 S2 Abdomen soft Gangrene R foot digits                       Palliative Performance Scale: 20% Additional Data Reviewed: Recent Labs     04/12/15  1936  04/13/15  0427  WBC  6.1  5.7  HGB  12.9  11.3*  PLT  899*  879*  NA  139  140  BUN  7  8  CREATININE  0.69  0.72     Time In: 1100 Time Out: 1230 Time Total: 90 minutes  Greater than 50%  of this time was spent counseling and coordinating care related to the above assessment and plan.  Signed by: Loistine Chance, MD Bowman, MD  04/13/2015, 1:10 PM  Please contact Palliative Medicine Team phone at [phone] for questions and concerns.

## 2015-04-13 NOTE — Progress Notes (Signed)
Patient arrived on the unit via stretcher with nurse tech. Patient has eyes closed and doesn't respond to any questions asked. Daughter and son in law at the bedside. Skin assessment completed with Sima Matas, RN. No skin issues noted other than Right 5th toe black in color and right 4th toe red in color. IV clean, dry, and intact. Safety Fall Prevention Plan was given, discussed and signed by patient's daughter.  Orders have been reviewed and implemented. Will continue to monitor the patient. Call light has been placed within reach and bed alarm has been activated.   Nena Polio BSN, RN  Phone Number: [phone]

## 2015-04-13 NOTE — H&P (Signed)
[facility] History and Physical  Tanya Farmer TOI:[id number] DOB: 06-20-1926 DOA: 04/12/2015  Referring physician: Dr.Wickline. PCP: Blanchie Serve, MD  Specialists: Dr.Berkowitz. [facility] follows up for thrombocytosis.                      Dr. Tamala Julian. Cardiologist.                      Dr. Donnetta Hutching. Vascular surgeon.  Chief Complaint: Black right foot fifth toe.  HPI: Tanya Farmer is a 81 y.o. female with history of atrial fibrillation on the Crixivan, chronic kidney disease stage III, hypertension, hypothyroidism and advanced dementia was brought to the ER after patient was found to have darkening of her right foot fifth toe. As per the family patient had not been doing well last few days and has been noticed to have moving her right lower extremity more than normal. Patient's family initially noticed some redness of the toe. Patient is not very communicative because of her dementia. Patient was brought to the ER with concerns of acute ischemic limb and vascular surgeon Dr. Oneida Alar has seen and evaluated the patient and at this time feels patient is best treated conservatively given her comorbidities. Patient on exam is not in acute distress. Patient is noncommunicative and does not provide history. As per the family otherwise patient has not had a nausea vomiting diarrhea.  Review of Systems: As presented in the history of presenting illness, rest negative.  Past Medical History  Diagnosis Date  . Hypertension   . Hyperlipidemia   . Thyroid disease     Hypothyroidism  . GERD (gastroesophageal reflux disease)   . Arthritis     Degenerative  . Peripheral vascular disease     Left   Fem-Pop and  tibial Thrombectomy  . High platelet count   . Dementia   . Pain in toe of right foot   . Hypothyroidism   . Long term (current) use of anticoagulants 03/13/2014  . Neuromuscular disorder   . Parkinson's disease    Past Surgical History  Procedure Laterality Date  . Femoral-popliteal  bypass graft  2002    Left  Dr. Sherren Mocha Early  . Abdominal hysterectomy    . Cataract surgery      left eye  . Colonoscopy N/A 09/06/2014    Procedure: COLONOSCOPY;  Surgeon: Jeryl Columbia, MD;  Location: [facility] ENDOSCOPY;  Service: Endoscopy;  Laterality: N/A;   Social History:  reports that she has quit smoking. She has never used smokeless tobacco. She reports that she does not drink alcohol or use illicit drugs. Where does patient live home. Can patient participate in ADLs? No.  No Known Allergies  Family History:  Family History  Problem Relation Age of Onset  . Heart failure Mother   . Stroke Father       Prior to Admission medications   Medication Sig Start Date End Date Taking? Authorizing Provider  acetaminophen (TYLENOL) 325 MG tablet Take 650 mg by mouth daily with breakfast.   Yes Historical Provider, MD  apixaban (ELIQUIS) 2.5 MG TABS tablet Take 2.5 mg by mouth 2 (two) times daily.   Yes Historical Provider, MD  atorvastatin (LIPITOR) 10 MG tablet Take 10 mg by mouth daily at 6 PM.    Yes Historical Provider, MD  calcium-vitamin D (OSCAL WITH D) 500-200 MG-UNIT per tablet Take 1 tablet by mouth 2 (two) times daily.   Yes Historical Provider, MD  Cranberry 250 MG CAPS Take 250 mg by mouth daily.   Yes Historical Provider, MD  ferrous sulfate 325 (65 FE) MG tablet Take 325 mg by mouth 2 (two) times daily with a meal.   Yes Historical Provider, MD  guaiFENesin (MUCINEX) 600 MG 12 hr tablet Take 600 mg by mouth 2 (two) times daily as needed for to loosen phlegm.   Yes Historical Provider, MD  hydroxyurea (HYDREA) 500 MG capsule Take 1,000 mg by mouth daily.  10/28/10  Yes Historical Provider, MD  levothyroxine (SYNTHROID, LEVOTHROID) 200 MCG tablet Take 200 mcg by mouth daily before breakfast.   Yes Historical Provider, MD  losartan (COZAAR) 100 MG tablet Take 100 mg by mouth daily.   Yes Historical Provider, MD  Multiple Vitamins-Minerals (CERTA-VITE PO) Take 1 tablet by mouth daily.     Yes Historical Provider, MD  pantoprazole (PROTONIX) 40 MG tablet Take 40 mg by mouth daily.   Yes Historical Provider, MD  senna-docusate (SENOKOT-S) 8.6-50 MG per tablet Take 1 tablet by mouth every evening.    Yes Historical Provider, MD  sodium chloride (OCEAN) 0.65 % SOLN nasal spray Place 1 spray into both nostrils daily.    Yes Historical Provider, MD    Physical Exam: Filed Vitals:   04/12/15 2300 04/12/15 2315 04/13/15 0021 04/13/15 0525  BP: 153/97 150/96 148/92 91/68  Pulse: 114 108 46 86  Temp:   98 F (36.7 C) 97.5 F (36.4 C)  TempSrc:      Resp: 17 18 17 18   Weight:   51 kg (112 lb 7 oz)   SpO2: 99% 100% 99% 100%     General:  Moderately built and poorly nourished.  Eyes: Anicteric. No pallor.  ENT: No discharge from the ears eyes nose and mouth.  Neck: No mass felt.  Cardiovascular: S1-S2 heard.  Respiratory: No rhonchi or crepitations.  Abdomen: Soft nontender bowel sounds present.  Skin: Mild erythema of the right foot. Gangrenous appearing right foot fifth toe and mild discoloration of the fourth toe.  Musculoskeletal: Right foot appears cold.  Psychiatric: Patient has dementia and noncommunicative.  Neurology - patient has dementia and is not communicative.   Labs on Admission:  Basic Metabolic Panel:  Recent Labs Lab 04/12/15 1936  NA 139  K 3.2*  CL 105  CO2 25  GLUCOSE 107*  BUN 7  CREATININE 0.69  CALCIUM 8.9   Liver Function Tests: No results for input(s): AST, ALT, ALKPHOS, BILITOT, PROT, ALBUMIN in the last 168 hours. No results for input(s): LIPASE, AMYLASE in the last 168 hours. No results for input(s): AMMONIA in the last 168 hours. CBC:  Recent Labs Lab 04/12/15 1936  WBC 6.1  NEUTROABS 4.4  HGB 12.9  HCT 39.4  MCV 94.9  PLT 899*   Cardiac Enzymes: No results for input(s): CKTOTAL, CKMB, CKMBINDEX, TROPONINI in the last 168 hours.  BNP (last 3 results) No results for input(s): BNP in the last 8760  hours.  ProBNP (last 3 results) No results for input(s): PROBNP in the last 8760 hours.  CBG:  Recent Labs Lab 04/13/15 0525  GLUCAP 106*    Radiological Exams on Admission: Dg Foot Complete Right  04/12/2015   CLINICAL DATA:  Discoloration of the fourth and fifth toes, no known injury, initial encounter  EXAM: RIGHT FOOT COMPLETE - 3+ VIEW  COMPARISON:  None.  FINDINGS: There is no evidence of fracture or dislocation. There is no evidence of arthropathy or other focal bone abnormality.  Soft tissues are unremarkable.  IMPRESSION: No acute abnormality noted.   Electronically Signed   By: Inez Catalina M.D.   On: 04/12/2015 20:28     Assessment/Plan Principal Problem:   Ischemia of foot Active Problems:   Essential thrombocytosis   Hypothyroidism   Dyslipidemia   Paroxysmal atrial fibrillation   Advanced dementia   Dry gangrene   1. Ischemic right foot - appreciated mask or surgery consult by Dr. Oneida Alar. At this time Dr. Oneida Alar is planning conservative approach with continuation of patient's anticoagulation and consult palliative care. If family considers amputation then have hold Apixaban. . Patient at this time does not look septic and is not on antibiotics. 2. Atrial fibrillation presently rate controlled - continue present medications. On Apixaban. 3. Hypertension - on Cozaar. 4. Thrombocytosis - on hydroxyurea. 5. Dyslipidemia on statins. 6. History of dementia. 7. Chronic kidney disease stage III - creatinine appears to be at baseline. Follow metabolic panel.  Patient's daughter initially consented for DO NOT RESUSCITATE but at this time they want to discuss with palliative care in a.m. and then further decide about the DO NOT RESUSCITATE status. At this time patient is full code for now. Patient's family was concerned about swallow for which speech therapy evaluation was requested.  Patient is being followed by Dr. Fransisco Beau at [facility] for her thrombocytosis may  discuss with Dr. Fransisco Beau in a.m.  Palliative care consult.   DVT Prophylaxis Apixaban. Code Status: Full code.  Family Communication: Discussed with patient's daughter.  Disposition Plan: Admit to inpatient.    Tanya Farmer N. [facility] Pager [phone].  If 7PM-7AM, please contact night-coverage [email or web address] Password [facility] 04/13/2015, 6:24 AM

## 2015-04-13 NOTE — Progress Notes (Addendum)
Subjective: Patient admitted this morning, see detailed H&P by Dr. Hal Hope 81 y.o. female with history of atrial fibrillation on the Apixaban,  chronic kidney disease stage III, hypertension, hypothyroidism and advanced dementia was brought to the ER after patient was found to have darkening of her right foot fifth toe. As per the family patient had not been doing well last few days and has been noticed to have moving her right lower extremity more than normal. Patient's family initially noticed some redness of the toe. Patient is not very communicative because of her dementia. Patient was brought to the ER with concerns of acute ischemic limb and vascular surgeon Dr. Oneida Alar has seen and evaluated the patient and at this time feels patient is best treated conservatively given her comorbidities.  This morning patient continues to be nonverbal  Filed Vitals:   04/13/15 0945  BP: 157/98  Pulse: 116  Temp: 98.4 F (36.9 C)  Resp: 18    Chest: Clear Bilaterally Heart : S1S2 RRR Abdomen: Soft, nontender Ext : No edema, black right fifth toe Neuro: Nonverbal  A/P Ischemic right foot Atrial fibrillation Hypertension Thrombocytosis C KD stage III Hypokalemia  I discussed with vascular surgery, patient is not a candidate for bypass or even amputation. Vascular surgery recommending palliative care. Palliative care to see the patient and make recommendations. Will replace potassium and check BMP in a.m.   [facility] [phone]

## 2015-04-14 LAB — BASIC METABOLIC PANEL
ANION GAP: 7 (ref 5–15)
BUN: 8 mg/dL (ref 6–20)
CALCIUM: 8.6 mg/dL — AB (ref 8.9–10.3)
CO2: 25 mmol/L (ref 22–32)
Chloride: 106 mmol/L (ref 101–111)
Creatinine, Ser: 0.7 mg/dL (ref 0.44–1.00)
Glucose, Bld: 100 mg/dL — ABNORMAL HIGH (ref 65–99)
POTASSIUM: 4.1 mmol/L (ref 3.5–5.1)
SODIUM: 138 mmol/L (ref 135–145)

## 2015-04-14 LAB — GLUCOSE, CAPILLARY
GLUCOSE-CAPILLARY: 103 mg/dL — AB (ref 65–99)
GLUCOSE-CAPILLARY: 118 mg/dL — AB (ref 65–99)
GLUCOSE-CAPILLARY: 144 mg/dL — AB (ref 65–99)
Glucose-Capillary: 103 mg/dL — ABNORMAL HIGH (ref 65–99)

## 2015-04-14 MED ORDER — METHOCARBAMOL 500 MG PO TABS
500.0000 mg | ORAL_TABLET | Freq: Three times a day (TID) | ORAL | Status: DC | PRN
  Administered 2015-04-22: 500 mg via ORAL
  Filled 2015-04-14: qty 1

## 2015-04-14 MED ORDER — HYDROCODONE-ACETAMINOPHEN 5-325 MG PO TABS
1.0000 | ORAL_TABLET | Freq: Four times a day (QID) | ORAL | Status: DC
  Administered 2015-04-14: 1 via ORAL
  Filled 2015-04-14 (×2): qty 1

## 2015-04-14 NOTE — Progress Notes (Signed)
Patient's daughter had a lengthy conversation with this RN regarding her concern for her mother's scheduled pain medication.  Daughter requested that her mother's pain medication be held and wished for MD to re-evaluate order.  She stated that she did not think her mother needed so much pain medicine and was requesting tylenol to be given instead.  Will pass on information to day shift RN.  Will continue to monitor.

## 2015-04-14 NOTE — Progress Notes (Signed)
SLP Cancellation Note  Patient Details Name: Tanya Farmer MRN: [id number] DOB: Jan 26, 1926   Cancelled treatment:       Reason Eval/Treat Not Completed: Fatigue/lethargy limiting ability to participate   Germain Osgood, M.A. CCC-SLP [phone]  Germain Osgood 04/14/2015, 12:09 PM

## 2015-04-14 NOTE — Progress Notes (Signed)
Patient Demographics   Tanya Farmer, is a 81 y.o. female, DOB - 19-Aug-1926, JYN:[id number] Admit date - 04/12/2015   Admitting Physician Rise Patience, MD Outpatient Primary MD for the patient is Blanchie Serve, MD LOS - 2    Assessment  & Plan :    1. Ischemic right foot- seen by vascular surgery, plan for conservative approach. Continue Apixaban. Will start scheduled Vicodin 5-325 1 tab po Q 6 hrs.  2. Atrial fibrillation- heart rate is controlled, continue anticoagulation with Apixaban. 3. Hypertension- continue Cozaar, BP is stable. 4. Thrombocytosis- continue hydroxyurea. 5. H/o dementia- stable 6. Goals of care- palliative care following  Code Status: DNR Family Communication: *Discussed with daughter and son in law  at bedside, patient only getting tylenol prn for pain. Recommended to start around the clock pain medication, daughter and son in law agree to start vicodin5- 325 1 tab po q 6 hrs, Disposition Plan: To be decided, hospice vs SNF   Consultants: Palliative care Vascular surgery  Procedures: None  Antibiotics: None     Subjective:/ HPI  81 y.o. female with history of atrial fibrillation on the Apixaban, chronic kidney disease stage III, hypertension, hypothyroidism and advanced dementia was brought to the ER after patient was found to have darkening of her right foot fifth toe. As per the family patient had not been doing well last few days and has been noticed to have moving her right lower extremity more than normal. Patient's family initially noticed some redness of the toe. Patient is not very communicative because of her dementia. Patient was brought to the ER with concerns of acute ischemic limb and vascular surgeon Dr. Oneida Alar has seen and evaluated the patient and at this time feels patient is best treated conservatively given her comorbidities.   This morning  continues to be non verbal, facial grimacing, holding right leg,   Objective:    Filed Vitals:   04/14/15 0420  BP: 129/86  Pulse: 70  Temp: 98.7 F (37.1 C)  Resp: 14    Intake/Output Summary (Last 24 hours) at 04/14/15 1529 Last data filed at 04/14/15 0600  Gross per 24 hour  Intake    560 ml  Output      2 ml  Net    558 ml   Filed Weights   04/13/15 0021 04/13/15 2050  Weight: 51 kg (112 lb 7 oz) 56 kg (123 lb 7.3 oz)      Exam:   General:  Appears in moderate distress, holding right lower extremity, facial grimacing on palpation of the right foot  Cardiovascular: S1S2 RRR  Respiratory: Clear bilaterally  Abdomen: Soft, non-tender, no organomegaly  Musculoskeletal: black right fifth toe, foot tender to palpation      Data Review:   Micro Results Recent Results (from the past 240 hour(s))  MRSA PCR Screening     Status: None   Collection Time: 04/13/15  3:50 AM  Result Value Ref Range Status   MRSA by PCR NEGATIVE NEGATIVE Final  Comment:        The GeneXpert MRSA Assay (FDA approved for NASAL specimens only), is one component of a comprehensive MRSA colonization surveillance program. It is not intended to diagnose MRSA infection nor to guide or monitor treatment for MRSA infections.     Radiology Reports Dg Foot Complete Right  04/12/2015   CLINICAL DATA:  Discoloration of the fourth and fifth toes, no known injury, initial encounter  EXAM: RIGHT FOOT COMPLETE - 3+ VIEW  COMPARISON:  None.  FINDINGS: There is no evidence of fracture or dislocation. There is no evidence of arthropathy or other focal bone abnormality. Soft tissues are unremarkable.  IMPRESSION: No acute abnormality noted.   Electronically Signed   By: Inez Catalina M.D.   On: 04/12/2015 20:28     CBC  Recent Labs Lab 04/12/15 1936 04/13/15 0427  WBC 6.1 5.7  HGB 12.9 11.3*  HCT 39.4 35.6*  PLT 899* 879*  MCV 94.9 95.2  MCH 31.1 30.2  MCHC 32.7 31.7  RDW 19.3* 19.3*   LYMPHSABS 1.1 1.0  MONOABS 0.4 0.3  EOSABS 0.1 0.1  BASOSABS 0.1 0.1    Chemistries   Recent Labs Lab 04/12/15 1936 04/13/15 0427 04/14/15 0513  NA 139 140 138  K 3.2* 3.2* 4.1  CL 105 103 106  CO2 25 25 25   GLUCOSE 107* 95 100*  BUN 7 8 8   CREATININE 0.69 0.72 0.70  CALCIUM 8.9 8.8* 8.6*  AST  --  27  --   ALT  --  17  --   ALKPHOS  --  53  --   BILITOT  --  0.6  --    ------------------------------------------------------------------------------------------------------------------ estimated creatinine clearance is 38.9 mL/min (by C-G formula based on Cr of 0.7). ------------------------------------------------------------------------------------------------------------------    Time Spent in minutes   25 min   Abaigeal Moomaw S M.D on 04/14/2015 at 3:29 PM  Between 7am to 7pm - Pager - [phone] After 7pm go to [email or web address] - password [facility]  [facility] -  Office  [phone]

## 2015-04-14 NOTE — Progress Notes (Signed)
Nutrition Brief Note  Pt identified for a low braden score. Chart reviewed. Pt with gradual decline. Per Palliative note, pt leaning towards comfort measures with addition of hospice services. No nutrition interventions warranted at this time. Please consult as needed.   Corrin Parker, MS, RD, LDN Pager # [phone] After hours/ weekend pager # [phone]

## 2015-04-14 NOTE — Progress Notes (Signed)
Utilization review completed. Kobe Jansma, RN, BSN. 

## 2015-04-14 NOTE — Progress Notes (Signed)
Patient ID: Tanya Farmer, female   DOB: 19-Oct-1925, 81 y.o.   MRN: [id number] I spoke with the patient's daughter for 20-25 minutes today by telephone. He is would recommend above-knee amputation although she simply has toe issues. She is nonambulatory and would not have any benefit from below-knee amputation which would certainly be marginal healing. Explained that she is not having any progressive tissue loss currently with dry gangrene and I would recommend keeping these areas dry on her fourth and fifth toe. I did not sense that she was having any pain but her daughter states that she is contracting her knee and feels this may be her mother's way of demonstrating pain. She is only receiving Tylenol for pain. Explain the options are pain medication and support versus above-knee amputation. She certainly would be at some risk for amputation regarding her general health but feel that she would be able to tolerate this. I certainly would recommend initially attempts at support and pain management prior to above-knee amputation. She will continue to discuss this with palliative care and the hospital team.

## 2015-04-15 LAB — GLUCOSE, CAPILLARY
GLUCOSE-CAPILLARY: 118 mg/dL — AB (ref 65–99)
GLUCOSE-CAPILLARY: 107 mg/dL — AB (ref 65–99)
Glucose-Capillary: 124 mg/dL — ABNORMAL HIGH (ref 65–99)

## 2015-04-15 MED ORDER — KCL IN DEXTROSE-NACL 20-5-0.45 MEQ/L-%-% IV SOLN
INTRAVENOUS | Status: DC
  Administered 2015-04-15 – 2015-04-28 (×15): via INTRAVENOUS
  Filled 2015-04-15 (×29): qty 1000

## 2015-04-15 MED ORDER — HYDROCODONE-ACETAMINOPHEN 5-325 MG PO TABS: 1.0000 | ORAL_TABLET | Freq: Four times a day (QID) | ORAL | Status: DC | PRN

## 2015-04-15 MED ORDER — ACETAMINOPHEN 325 MG PO TABS
650.0000 mg | ORAL_TABLET | Freq: Three times a day (TID) | ORAL | Status: DC
  Administered 2015-04-18 – 2015-04-27 (×13): 650 mg via ORAL
  Filled 2015-04-15 (×24): qty 2

## 2015-04-15 NOTE — Progress Notes (Signed)
Daily Progress Note   Patient Name: Tanya Farmer       Date: 04/15/2015 DOB: [birth year]  Age: 81 y.o. MRN#: [id number] Attending Physician: Velvet Bathe, MD Primary Care Physician: Blanchie Serve, MD Admit Date: 04/12/2015  Reason for Consultation/Follow-up: Establishing goals of care  Subjective: 81 y.o. year old female who presents for evaluation of right foot ischemia. Pt noted to have black right 5th toe by daughter at her nursing facility [facility] and was brought to ER. Pt has severe dementia and lives in SNF. She is essentially total assist and has to be fed. Daughter stated that she had gone down hill over the last week. Pt is non verbal at baseline.  Pt is on Eliquis for chronic afib and daughter stated she has not been refusing meds. She also has thrombocytosis and has had right iliofemoral thrombectomy and left fempop by Dr Donnetta Hutching in 2002. Other medical problems include hypertension hyperlipidemia, parkinson's, multiple chronic UTIs.  Interval Events:  I met with the patient and her family, including her daughter, this morning. The patient was sleepy this morning and did not participate in conversation. Her daughter reports that this has occurred since she received Vicodin yesterday. She is very concerned about her mother receiving pain medication, and has been refusing for her to get it since the first dose. I discussed with her that it is difficult to assess pain in patients with dementia, and attempted to discuss strategies in order to ensure that her mother has adequate pain control while also minimizing side effects such as sedation. Her daughter reports that she disagrees her mother is having pain, and she feels that any pain medications are going to make things worse for her rather than better.  We also discussed Ms. Tinner's overall prognosis. I attempted to explain that overall it appears that her condition is declining. Her daughter does seem to relay stories that would  indicate she is having a decrease in her functional status, cognition, and nutrition. When I asked her specifically about these domains, however, she reports that she feels that her mother has not had any great change. Overall, the patient's daughter did not want to engage in conversation today. She reports that she is trying to focus on her mother getting better and requests that I follow up at a different time.  Length of Stay: 3 days  Current Medications: Scheduled Meds:  . acetaminophen  650 mg Oral TID  . antiseptic oral rinse  7 mL Mouth Rinse BID  . apixaban  2.5 mg Oral BID  . atorvastatin  10 mg Oral q1800  . calcium-vitamin D  1 tablet Oral BID  . ferrous sulfate  325 mg Oral BID WC  . hydroxyurea  1,000 mg Oral Q breakfast  . levothyroxine  200 mcg Oral QAC breakfast  . losartan  100 mg Oral Daily  . multivitamin with minerals  5 mL Oral Daily  . pantoprazole  40 mg Oral Daily  . senna-docusate  1 tablet Oral QPM  . sodium chloride  1 spray Each Nare Daily    Continuous Infusions: . sodium chloride 10 mL/hr at 04/13/15 0042    PRN Meds: guaiFENesin, HYDROcodone-acetaminophen, methocarbamol, ondansetron **OR** ondansetron (ZOFRAN) IV  Palliative Performance Scale: 20-30%     Vital Signs: BP 118/69 mmHg  Pulse 84  Temp(Src) 97.2 F (36.2 C) (Oral)  Resp 18  Wt 56 kg (123 lb 7.3 oz)  SpO2 99% SpO2: SpO2: 99 % O2 Device: O2 Device: Not  Delivered O2 Flow Rate:    Intake/output summary:  Intake/Output Summary (Last 24 hours) at 04/15/15 0942 Last data filed at 04/15/15 0815  Gross per 24 hour  Intake    300 ml  Output      0 ml  Net    300 ml   LBM:   Baseline Weight: Weight: 51 kg (112 lb 7 oz) Most recent weight: Weight: 56 kg (123 lb 7.3 oz)  Physical Exam:  Weak frail elderly AA female, sleepy but will open eyes occasionally,  NAD Clear anteriorly S1 S2 Abdomen soft Gangrene R foot digits           Additional Data Reviewed: Recent Labs      04/12/15  1936  04/13/15  0427  04/14/15  0513  WBC  6.1  5.7   --   HGB  12.9  11.3*   --   PLT  899*  879*   --   NA  139  140  138  BUN  '7  8  8  ' CREATININE  0.69  0.72  0.70     Problem List:  Patient Active Problem List   Diagnosis Date Noted  . Goals of care, counseling/discussion   . Encounter for palliative care   . 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 Ischemia of foot 04/12/2015  . Advanced dementia 04/01/2015  . Palliative care encounter 09/08/2014  . Decreased appetite 09/08/2014  . Thrombocythemia 09/03/2014  . Acute lower GI bleeding 08/31/2014  . GI bleed 08/31/2014  . Cerumen impaction 07/14/2014  . Paroxysmal atrial fibrillation 06/26/2014  . 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 Dementia with Parkinsonism 06/26/2014  . Recurrent UTI 05/19/2014  . Insomnia 12/18/2013  . Constipation 12/18/2013  . Dementia without behavioral disturbance 09/23/2013  . GERD (gastroesophageal reflux disease) 09/23/2013  . Dyslipidemia 09/23/2013  . Weakness generalized 09/23/2013  . UTI (lower urinary tract infection) 09/16/2013  . FTT (failure to thrive) in adult 09/16/2013  . Facial erythema 09/16/2013  . Chronic anticoagulation 09/16/2013  . Essential hypertension, benign 07/06/2013  . Abnormal EKG 07/05/2013  . Essential thrombocytosis 07/05/2013  . Hypothyroidism 07/05/2013  . Peripheral vascular disease, unspecified 05/08/2012     Palliative Care Assessment & Plan    Code Status:  DNR  Goals of Care:  Patient's daughter was unwilling to engage further in conversation regarding long-term goals of care today. She reports that she thinks there has been acute change in her mother since she received pain medication and this is very concerning to her. We did discuss that overall it appears her mother's functional status, nutrition, and cognition have been declining. Following this discussion, however, her daughter reports that she does not  think there has been any significant change in her mother over the past several months.  Symptom Management:  Pain: Patient received one dose of scheduled Vicodin yesterday. Since this time, her daughter reports she is very sleepy. I attempted to discuss strategies for pain management in patients with dementia with the daughter. She noted that she disagreed with my assessment. I attempted to discuss options for providing analgesia including reduced doses of opioids through use of low-dose morphine instead of Vicodin. Her daughter was unwilling to discuss that she reports that morphine is too strong a medication. I again attempted to explain equi-analgesic dosing in the my intent would be to give her less medication overall if she is sleepy. Her daughter reports that she does not want her mother  to receive morphine under any circumstances and she would like all narcotics held unless she is notified prior to administration. We then discussed use around-the-clock Tylenol which the patient's daughter was also resistant to begin. Following discussion, she was in agreement with plan to start Tylenol three times per day as well as have rescue as needed Vicodin available if pain did occur.    Psycho-social/Spiritual:  Desire for further Chaplaincy support:no   Prognosis: < 6 months Discharge Planning: SNF with hospice versus inpatient hospice versus home with hospice. Care management consult placed for Hospice discussions.   Care plan was discussed with Patient's daughter.  Thank you for allowing the Palliative Medicine Team to assist in the care of this patient.   Time In: 0845 Time Out: 0925 Total Time 45 Prolonged Time Billed  no     Greater than 50%  of this time was spent counseling and coordinating care related to the above assessment and plan.   Micheline Rough, MD  04/15/2015, 9:42 AM  Please contact Palliative Medicine Team phone at [phone] for questions and concerns.

## 2015-04-15 NOTE — Progress Notes (Signed)
Patient Demographics   Tanya Farmer, is a 81 y.o. female, DOB - 10/14/1925, YSA:[id number] Admit date - 04/12/2015   Admitting Physician Rise Patience, MD Outpatient Primary MD for the patient is Blanchie Serve, MD LOS - 3    Assessment  & Plan :    1. Ischemic right foot- seen by vascular surgery, plan for conservative approach. Continue Apixaban. After vicodin has been hypersomnolent. As such will scale back to acetaminophen 2. Atrial fibrillation- heart rate is controlled, continue anticoagulation with Apixaban. 3. Hypertension- continue Cozaar, BP is stable. 4. Thrombocytosis- continue hydroxyurea. 5. H/o dementia- stable 6. Goals of care- palliative care following 7. Altered mental status: Most likely secondary to opiate use and patient with poor oral intake and dementia. We'll place on D5 one half normal saline and discontinue Vicodin.  Code Status: DNR Family Communication: *Discussed with daughter and son in law  at bedside, patient only getting tylenol prn for pain. Recommended to start around the clock pain medication, daughter and son in law agree to start vicodin5- 325 1 tab po q 6 hrs, Disposition Plan: hospice vs SNF   Consultants: Palliative care Vascular surgery  Procedures: None  Antibiotics: None     Subjective:/ HPI  81 y.o. female with history of atrial fibrillation on the Apixaban, chronic kidney disease stage III, hypertension, hypothyroidism and advanced dementia was brought to the ER after patient was found to have darkening of her right foot fifth toe. As per the family patient had not been doing well last few days and has been noticed to have moving her right lower extremity more than normal. Patient's family initially noticed some redness of the toe. Patient is not very communicative because of her dementia. Patient was brought to the ER with concerns of acute  ischemic limb and vascular surgeon Dr. Oneida Alar has seen and evaluated the patient and at this time feels patient is best treated conservatively given her comorbidities.   No new complaints.   Objective:    Filed Vitals:   04/15/15 0700  BP: 118/69  Pulse: 84  Temp: 97.2 F (36.2 C)  Resp: 18    Intake/Output Summary (Last 24 hours) at 04/15/15 1829 Last data filed at 04/15/15 1554  Gross per 24 hour  Intake      0 ml  Output      0 ml  Net      0 ml   Filed Weights   04/13/15 0021 04/13/15 2050  Weight: 51 kg (112 lb 7 oz) 56 kg (123 lb 7.3 oz)      Exam:   General:  Appears in moderate distress, holding right lower extremity, facial grimacing on palpation of the right foot  Cardiovascular: S1S2 RRR  Respiratory: Clear bilaterally, no increased wob  Abdomen: Soft, non-tender, no organomegaly  Musculoskeletal: black right fifth toe, foot tender to palpation      Data Review:   Micro Results Recent Results (from the past 240 hour(s))  MRSA PCR Screening     Status: None  Collection Time: 04/13/15  3:50 AM  Result Value Ref Range Status   MRSA by PCR NEGATIVE NEGATIVE Final    Comment:        The GeneXpert MRSA Assay (FDA approved for NASAL specimens only), is one component of a comprehensive MRSA colonization surveillance program. It is not intended to diagnose MRSA infection nor to guide or monitor treatment for MRSA infections.     Radiology Reports Dg Foot Complete Right  04/12/2015   CLINICAL DATA:  Discoloration of the fourth and fifth toes, no known injury, initial encounter  EXAM: RIGHT FOOT COMPLETE - 3+ VIEW  COMPARISON:  None.  FINDINGS: There is no evidence of fracture or dislocation. There is no evidence of arthropathy or other focal bone abnormality. Soft tissues are unremarkable.  IMPRESSION: No acute abnormality noted.   Electronically Signed   By: Inez Catalina M.D.   On: 04/12/2015 20:28     CBC  Recent Labs Lab 04/12/15 1936  04/13/15 0427  WBC 6.1 5.7  HGB 12.9 11.3*  HCT 39.4 35.6*  PLT 899* 879*  MCV 94.9 95.2  MCH 31.1 30.2  MCHC 32.7 31.7  RDW 19.3* 19.3*  LYMPHSABS 1.1 1.0  MONOABS 0.4 0.3  EOSABS 0.1 0.1  BASOSABS 0.1 0.1    Chemistries   Recent Labs Lab 04/12/15 1936 04/13/15 0427 04/14/15 0513  NA 139 140 138  K 3.2* 3.2* 4.1  CL 105 103 106  CO2 25 25 25   GLUCOSE 107* 95 100*  BUN 7 8 8   CREATININE 0.69 0.72 0.70  CALCIUM 8.9 8.8* 8.6*  AST  --  27  --   ALT  --  17  --   ALKPHOS  --  53  --   BILITOT  --  0.6  --    ------------------------------------------------------------------------------------------------------------------ estimated creatinine clearance is 38.9 mL/min (by C-G formula based on Cr of 0.7). ------------------------------------------------------------------------------------------------------------------    Time Spent in minutes   25 min   Velvet Bathe M.D on 04/15/2015 at 6:29 PM  Between 7am to 7pm - Pager - [phone] After 7pm go to [email or web address] - password [facility]  [facility] -  Office  [phone]

## 2015-04-15 NOTE — Progress Notes (Signed)
Speech Language Pathology Treatment: Dysphagia  Patient Details Name: Tanya Farmer MRN: [id number] DOB: February 18, 1926 Today's Date: 04/15/2015 Time: 8242-3536 SLP Time Calculation (min) (ACUTE ONLY): 13 min  Assessment / Plan / Recommendation Clinical Impression  Pt is too lethargic for safe administration of POs this afternoon, however family present has several questions regarding eating/drinking. Daughter at bedside says that she has been offering juice by cup sips and swabs to pt as she has been this drowsy. SLP provided training about the importance of alertness and awareness to decrease aspiration risk. Education was also provided about what types of foods are appropriate given current pureed diet, as daughter reports trying to provide bites of watermelon. Treatment today focused on family education/training, and will f/u as pt is more alert for additional treatment targeting diet tolerance.   HPI Other Pertinent Information: Tanya Farmer is a 81 y.o. female with history of atrial fibrillation, chronic kidney disease stage III, hypertension, hypothyroidism and advanced dementia was brought to the ER after patient was found to have darkening of her right foot fifth toe. No recent CXR.  08/2014 with cognitive based dysphagia with recommendations for Dys 3 and thin liquids   Pertinent Vitals Pain Assessment: Faces Faces Pain Scale: No hurt  SLP Plan  Continue with current plan of care    Recommendations Diet recommendations: Dysphagia 1 (puree);Thin liquid Liquids provided via: Cup;Straw Medication Administration: Crushed with puree Supervision: Staff to assist with self feeding;Full supervision/cueing for compensatory strategies Compensations: Slow rate;Small sips/bites Postural Changes and/or Swallow Maneuvers: Upright 30-60 min after meal;Seated upright 90 degrees       Oral Care Recommendations: Oral care BID Follow up Recommendations: Skilled Nursing facility Plan: Continue with current  plan of care    Germain Osgood, M.A. CCC-SLP [phone]  Germain Osgood 04/15/2015, 4:22 PM

## 2015-04-15 NOTE — Care Management Important Message (Signed)
Important Message  Patient Details  Name: Tanya Farmer MRN: [id number] Date of Birth: 04-05-1926   Medicare Important Message Given:  Yes-second notification given    Pricilla Handler 04/15/2015, 12:19 PM

## 2015-04-16 LAB — COMPREHENSIVE METABOLIC PANEL
ALBUMIN: 2.6 g/dL — AB (ref 3.5–5.0)
ALK PHOS: 55 U/L (ref 38–126)
ALT: 18 U/L (ref 14–54)
AST: 27 U/L (ref 15–41)
Anion gap: 9 (ref 5–15)
BILIRUBIN TOTAL: 0.5 mg/dL (ref 0.3–1.2)
CO2: 21 mmol/L — ABNORMAL LOW (ref 22–32)
CREATININE: 0.67 mg/dL (ref 0.44–1.00)
Calcium: 8.6 mg/dL — ABNORMAL LOW (ref 8.9–10.3)
Chloride: 106 mmol/L (ref 101–111)
GFR calc Af Amer: >60 mL/min (ref >60)
GLUCOSE: 150 mg/dL — AB (ref 65–99)
POTASSIUM: 4.1 mmol/L (ref 3.5–5.1)
Sodium: 136 mmol/L (ref 135–145)
TOTAL PROTEIN: 6.7 g/dL (ref 6.5–8.1)

## 2015-04-16 LAB — CBC
HCT: 35.6 % — ABNORMAL LOW (ref 36.0–46.0)
HEMOGLOBIN: 11.7 g/dL — AB (ref 12.0–15.0)
MCH: 30.7 pg (ref 26.0–34.0)
MCHC: 32.9 g/dL (ref 30.0–36.0)
MCV: 93.4 fL (ref 78.0–100.0)
Platelets: 950 10*3/uL (ref 150–400)
RBC: 3.81 MIL/uL — ABNORMAL LOW (ref 3.87–5.11)
RDW: 18.6 % — AB (ref 11.5–15.5)
WBC: 5.4 10*3/uL (ref 4.0–10.5)

## 2015-04-16 LAB — GLUCOSE, CAPILLARY
GLUCOSE-CAPILLARY: 126 mg/dL — AB (ref 65–99)
GLUCOSE-CAPILLARY: 128 mg/dL — AB (ref 65–99)
GLUCOSE-CAPILLARY: 133 mg/dL — AB (ref 65–99)
GLUCOSE-CAPILLARY: 92 mg/dL (ref 65–99)
Glucose-Capillary: 141 mg/dL — ABNORMAL HIGH (ref 65–99)

## 2015-04-16 LAB — TSH: TSH: 6.336 u[IU]/mL — AB (ref 0.350–4.500)

## 2015-04-16 LAB — VITAMIN B12: Vitamin B-12: 499 pg/mL (ref 180–914)

## 2015-04-16 LAB — FOLATE: Folate: 21.2 ng/mL (ref >5.9)

## 2015-04-16 LAB — AMMONIA: AMMONIA: 50 umol/L — AB (ref 9–35)

## 2015-04-16 NOTE — Progress Notes (Signed)
Patient Demographics   [facility], is a 81 y.o. female, DOB - 24-May-1926, NKN:[id number] Admit date - 04/12/2015   Admitting Physician Rise Patience, MD Outpatient Primary MD for the patient is Blanchie Serve, MD LOS - 4    Assessment  & Plan :    1. Ischemic right foot- seen by vascular surgery, plan for conservative approach. Continue Apixaban. After vicodin has been hypersomnolent. As such will scaled back pain medication to acetaminophen 2. Atrial fibrillation- heart rate is controlled, continue anticoagulation with Apixaban. 3. Hypertension- continue Cozaar, BP is stable. 4. Thrombocytosis- continue hydroxyurea. 5. H/o dementia- stable 6. Goals of care- palliative care following 7. Altered mental status: improved after IVF's and discontinuation of Vicodin. Will obtain cmp, cbc, ammonia level, vitamin b12, folate, tsh  Code Status: DNR Family Communication: *Discussed with daughter and son in law  at bedside, patient only getting tylenol prn for pain. Recommended to start around the clock pain medication, daughter and son in law agree to start vicodin5- 325 1 tab po q 6 hrs, Disposition Plan: hospice vs SNF   Consultants: Palliative care Vascular surgery  Procedures: None  Antibiotics: None     Subjective:/ HPI  81 y.o. female with history of atrial fibrillation on the Apixaban, chronic kidney disease stage III, hypertension, hypothyroidism and advanced dementia was brought to the ER after patient was found to have darkening of her right foot fifth toe. As per the family patient had not been doing well last few days and has been noticed to have moving her right lower extremity more than normal. Patient's family initially noticed some redness of the toe. Patient is not very communicative because of her dementia. Patient was brought to the ER with concerns of acute ischemic limb and  vascular surgeon Dr. Oneida Alar has seen and evaluated the patient and at this time feels patient is best treated conservatively given her comorbidities.   No new complaints.   Objective:    Filed Vitals:   04/16/15 1733  BP: 126/74  Pulse: 78  Temp: 98.6 F (37 C)  Resp: 18    Intake/Output Summary (Last 24 hours) at 04/16/15 1803 Last data filed at 04/16/15 1700  Gross per 24 hour  Intake    525 ml  Output      0 ml  Net    525 ml   Filed Weights   04/13/15 0021 04/13/15 2050  Weight: 51 kg (112 lb 7 oz) 56 kg (123 lb 7.3 oz)      Exam:   General:  Appears in moderate distress, holding right lower extremity, facial grimacing on palpation of the right foot  Cardiovascular: S1S2 RRR  Respiratory: Clear bilaterally, no increased wob  Abdomen: Soft, non-tender, no organomegaly  Musculoskeletal: black right fifth toe, foot tender to palpation      Data Review:   Micro Results Recent Results (from the past 240 hour(s))  MRSA PCR Screening     Status: None   Collection Time: 04/13/15  3:50 AM  Result  Value Ref Range Status   MRSA by PCR NEGATIVE NEGATIVE Final    Comment:        The GeneXpert MRSA Assay (FDA approved for NASAL specimens only), is one component of a comprehensive MRSA colonization surveillance program. It is not intended to diagnose MRSA infection nor to guide or monitor treatment for MRSA infections.     Radiology Reports Dg Foot Complete Right  04/12/2015   CLINICAL DATA:  Discoloration of the fourth and fifth toes, no known injury, initial encounter  EXAM: RIGHT FOOT COMPLETE - 3+ VIEW  COMPARISON:  None.  FINDINGS: There is no evidence of fracture or dislocation. There is no evidence of arthropathy or other focal bone abnormality. Soft tissues are unremarkable.  IMPRESSION: No acute abnormality noted.   Electronically Signed   By: Inez Catalina M.D.   On: 04/12/2015 20:28     CBC  Recent Labs Lab 04/12/15 1936 04/13/15 0427  WBC  6.1 5.7  HGB 12.9 11.3*  HCT 39.4 35.6*  PLT 899* 879*  MCV 94.9 95.2  MCH 31.1 30.2  MCHC 32.7 31.7  RDW 19.3* 19.3*  LYMPHSABS 1.1 1.0  MONOABS 0.4 0.3  EOSABS 0.1 0.1  BASOSABS 0.1 0.1    Chemistries   Recent Labs Lab 04/12/15 1936 04/13/15 0427 04/14/15 0513  NA 139 140 138  K 3.2* 3.2* 4.1  CL 105 103 106  CO2 25 25 25   GLUCOSE 107* 95 100*  BUN 7 8 8   CREATININE 0.69 0.72 0.70  CALCIUM 8.9 8.8* 8.6*  AST  --  27  --   ALT  --  17  --   ALKPHOS  --  53  --   BILITOT  --  0.6  --    ------------------------------------------------------------------------------------------------------------------ estimated creatinine clearance is 38.9 mL/min (by C-G formula based on Cr of 0.7). ------------------------------------------------------------------------------------------------------------------    Time Spent in minutes   25 min   Velvet Bathe M.D on 04/16/2015 at 6:03 PM  Between 7am to 7pm - Pager - [phone] After 7pm go to [email or web address] - password [facility]  [facility] -  Office  [phone]

## 2015-04-16 NOTE — Progress Notes (Signed)
Patient is lethargic and hard to arouse. Patient unable to take PO medications. RN will continue to monitor patient.  Ermalinda Memos, RN

## 2015-04-16 NOTE — Progress Notes (Signed)
SLP Cancellation Note  Patient Details Name: [facility] MRN: [id number] DOB: 09-11-1925   Cancelled treatment:       Reason Eval/Treat Not Completed: Fatigue/lethargy limiting ability to participate. Unable to arouse pt sufficiently for safe PO intake.   Germain Osgood, M.A. CCC-SLP [phone]  Germain Osgood 04/16/2015, 3:08 PM

## 2015-04-16 NOTE — Progress Notes (Signed)
Critical lab value, Platelets:950,000. MD notified. RN will continue to monitor patient.  Ermalinda Memos, RN

## 2015-04-16 NOTE — Progress Notes (Signed)
Patient is less lethargic than yesterday, however, I am still unable to safely feed her any of her breakfast or administer any PO medications at this time. Will continue to monitor.   Tyna Jaksch, RN

## 2015-04-16 NOTE — Progress Notes (Signed)
Daily Progress Note   Patient Name: [facility]       Date: 04/16/2015 DOB: Jan 16, 1926  Age: 81 y.o. MRN#: [id number] Attending Physician: Tanya Bathe, MD Primary Care Physician: Tanya Serve, MD Admit Date: 04/12/2015  Reason for Consultation/Follow-up: Establishing goals of care  Subjective: 81 y.o. year old female who presents for evaluation of right foot ischemia. Pt noted to have black right 5th toe by daughter at her nursing facility [facility] and was brought to ER. Pt has severe dementia and lives in SNF. She is essentially total assist and has to be fed. Daughter stated that she had gone down hill over the last week. Pt is non verbal at baseline.  Pt is on Eliquis for chronic afib and daughter stated she has not been refusing meds. She also has thrombocytosis and has had right iliofemoral thrombectomy and left fempop by Dr Donnetta Hutching in 2002. Other medical problems include hypertension hyperlipidemia, parkinson's, multiple chronic UTIs.  Interval Events:  Met with the patient, her daughter, and her son-in-law on this morning. They report they remain concerned over the fact that she still is very sleepy. I attempted to explain the my concern that she might be developing hypoactive delirium, but her daughter reports she does not feel this is a case. She states her mother is "very comfortable" here in [facility] and that this is like home to her. She still feels that one dose of Vicodin [facility] received 48 hours ago is culprit of the change in her mental status.  We had another lengthy discussion regarding goals of care as well as discharge options for [facility]. The patient's family advocate very strongly on her behalf, but I'm not sure that they have realistic expectations for facility on discharge. They report that they have been happy at [facility], but they also desire an option that would provide "[facility] level care" with the same staffing care level that she receives here  in the [facility] 24 hours per day.  I attempted again to discuss the long-term goals for [facility] as well as what is most important to her being the focus of plans moving forward. We again discussed the options discharge to skilled facility, hospice at home, hospice in a skilled nursing facility, and residential hospice options such as [facility].  Overall, it appears that her daughter still has difficulty processing the decline that her mother has been showing. I discussed again that her nutrition, functional status, and cognition all appear to be declining and that this is likely a sign of disease progression. She still feels as though her mother will improve to her prior baseline where she is able to ambulate independently and feed herself.  I shared my concern that if her nutritional status does not improve, that her time would be very limited. I relayed to them that I felt that this was going to be a key factor in how long she lived.  - Family is still trying to process options for discharge including back to [facility] with without hospice support. I recommendation was to pursue placement at [facility] with hospice support. If his nutritional status did not improve it appeared that she had limited life expectancy in the 2-4 week range secondary to poor nutritional status, I advised them that she would be eligible for placement of peek in place where they may be able to receive a higher level of care focused on her mother think be provided at [facility]  Length of Stay: 4 days  Current Medications: Scheduled Meds:  . acetaminophen  650 mg Oral TID  . antiseptic oral rinse  7 mL Mouth Rinse BID  . apixaban  2.5 mg Oral BID  . atorvastatin  10 mg Oral q1800  . calcium-vitamin D  1 tablet Oral BID  . ferrous sulfate  325 mg Oral BID WC  . hydroxyurea  1,000 mg Oral Q breakfast  . levothyroxine  200 mcg Oral QAC breakfast  . losartan  100 mg Oral Daily  . multivitamin with minerals  5  mL Oral Daily  . pantoprazole  40 mg Oral Daily  . senna-docusate  1 tablet Oral QPM  . sodium chloride  1 spray Each Nare Daily    Continuous Infusions: . dextrose 5 % and 0.45 % NaCl with KCl 20 mEq/L 75 mL/hr at 04/15/15 1900    PRN Meds: guaiFENesin, methocarbamol, ondansetron **OR** ondansetron (ZOFRAN) IV  Palliative Performance Scale: 20-30%     Vital Signs: BP 126/74 mmHg  Pulse 78  Temp(Src) 98.6 F (37 C) (Axillary)  Resp 18  Wt 56 kg (123 lb 7.3 oz)  SpO2 100% SpO2: SpO2: 100 % O2 Device: O2 Device: Not Delivered O2 Flow Rate:    Intake/output summary:   Intake/Output Summary (Last 24 hours) at 04/16/15 1921 Last data filed at 04/16/15 1700  Gross per 24 hour  Intake    525 ml  Output      0 ml  Net    525 ml   LBM:   Baseline Weight: Weight: 51 kg (112 lb 7 oz) Most recent weight: Weight: 56 kg (123 lb 7.3 oz)  Physical Exam:  Weak frail elderly AA female, sleepy but will open eyes occasionally,  NAD Clear anteriorly S1 S2 Abdomen soft Gangrene R foot digits           Additional Data Reviewed: Recent Labs     04/14/15  0513  04/16/15  1852  WBC   --   5.4  HGB   --   11.7*  PLT   --   PENDING  NA  138   --   BUN  8   --   CREATININE  0.70   --      Problem List:  Patient Active Problem List   Diagnosis Date Noted  . Goals of care, counseling/discussion   . Encounter for palliative care   . 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 Ischemia of foot 04/12/2015  . Advanced dementia 04/01/2015  . Palliative care encounter 09/08/2014  . Decreased appetite 09/08/2014  . Thrombocythemia 09/03/2014  . Acute lower GI bleeding 08/31/2014  . GI bleed 08/31/2014  . Cerumen impaction 07/14/2014  . Paroxysmal atrial fibrillation 06/26/2014  . 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 Dementia with Parkinsonism 06/26/2014  . Recurrent UTI 05/19/2014  . Insomnia 12/18/2013  . Constipation 12/18/2013  . Dementia without  behavioral disturbance 09/23/2013  . GERD (gastroesophageal reflux disease) 09/23/2013  . Dyslipidemia 09/23/2013  . Weakness generalized 09/23/2013  . UTI (lower urinary tract infection) 09/16/2013  . FTT (failure to thrive) in adult 09/16/2013  . Facial erythema 09/16/2013  . Chronic anticoagulation 09/16/2013  . Essential hypertension, benign 07/06/2013  . Abnormal EKG 07/05/2013  . Essential thrombocytosis 07/05/2013  . Hypothyroidism 07/05/2013  . Peripheral vascular disease, unspecified 05/08/2012     Palliative Care Assessment & Plan    Code Status:  DNR  Goals of Care:  I  did have a lengthy conversation with patient's family regarding goals of care and planning for discharge. The recommendation was to consider discharge back to [facility] with additional support of hospice. The patient's daughter reports that she will continue to see how she does overnight and see if there are any reversible cause of her delirium found on workup by Dr. Wendee Beavers.  Symptom Management:  Pain: Patient does not appear to show any signs of acute pain today. Would recommend continue same regimen of Tylenol with Vicodin as needed for severe pain.  Sleepiness: I would consider it highly likely this is a hypoactive delirium. Discussed with Dr. Wendee Beavers who is in agreement with plan for a trial dose of methylphenidate in the morning if her mental status is not improved.  Psycho-social/Spiritual:  Desire for further Chaplaincy support:no   Prognosis: < 6 months Discharge Planning: SNF with hospice versus inpatient hospice versus home with hospice. Care management consult placed for Hospice discussions.   Care plan was discussed with Patient's daughter.  Thank you for allowing the Palliative Medicine Team to assist in the care of this patient.   Time In: 1020 Time Out: 1130 Total Time 70 Prolonged Time Billed yes    Greater than 50%  of this time was spent counseling and coordinating care  related to the above assessment and plan.   Micheline Rough, MD  04/16/2015, 7:21 PM  Please contact Palliative Medicine Team phone at [phone] for questions and concerns.

## 2015-04-17 LAB — GLUCOSE, CAPILLARY
GLUCOSE-CAPILLARY: 118 mg/dL — AB (ref 65–99)
GLUCOSE-CAPILLARY: 124 mg/dL — AB (ref 65–99)
GLUCOSE-CAPILLARY: 147 mg/dL — AB (ref 65–99)
Glucose-Capillary: 143 mg/dL — ABNORMAL HIGH (ref 65–99)
Glucose-Capillary: 119 mg/dL — ABNORMAL HIGH (ref 65–99)

## 2015-04-17 LAB — HEPARIN LEVEL (UNFRACTIONATED): HEPARIN UNFRACTIONATED: 0.4 [IU]/mL (ref 0.30–0.70)

## 2015-04-17 LAB — T4, FREE: Free T4: 1.42 ng/dL — ABNORMAL HIGH (ref 0.61–1.12)

## 2015-04-17 MED ORDER — LACTULOSE ENEMA
300.0000 mL | Freq: Once | ORAL | Status: AC
  Administered 2015-04-17: 300 mL via RECTAL
  Filled 2015-04-17: qty 300

## 2015-04-17 MED ORDER — PANTOPRAZOLE SODIUM 40 MG IV SOLR
40.0000 mg | INTRAVENOUS | Status: DC
  Administered 2015-04-17 – 2015-04-26 (×10): 40 mg via INTRAVENOUS
  Filled 2015-04-17 (×10): qty 40

## 2015-04-17 MED ORDER — HEPARIN (PORCINE) IN NACL 100-0.45 UNIT/ML-% IJ SOLN
850.0000 [IU]/h | INTRAMUSCULAR | Status: DC
  Administered 2015-04-17 – 2015-04-20 (×3): 850 [IU]/h via INTRAVENOUS
  Filled 2015-04-17 (×5): qty 250

## 2015-04-17 MED ORDER — LEVOTHYROXINE SODIUM 100 MCG IV SOLR
100.0000 ug | Freq: Every day | INTRAVENOUS | Status: DC
  Administered 2015-04-17 – 2015-04-25 (×9): 100 ug via INTRAVENOUS
  Filled 2015-04-17 (×15): qty 5

## 2015-04-17 NOTE — Progress Notes (Signed)
Unable to administer PO medications due to patient being lethargic. RN will continue to monitor patient.  Ermalinda Memos, RN

## 2015-04-17 NOTE — Progress Notes (Signed)
Daily Progress Note   Patient Name: [facility]       Date: 04/17/2015 DOB: November 17, 1925  Age: 81 y.o. MRN#: [id number] Attending Physician: Tanya Bathe, MD Primary Care Physician: Tanya Serve, MD Admit Date: 04/12/2015  Reason for Consultation/Follow-up: Establishing goals of care  Subjective: 81 y.o. year old female who presents for evaluation of right foot ischemia. Pt noted to have black right 5th toe by daughter at her nursing facility [facility] and was brought to ER. Pt has severe dementia and lives in SNF. She is essentially total assist and has to be fed. Daughter stated that she had gone down hill over the last week. Pt is non verbal at baseline.  Pt is on Eliquis for chronic afib and daughter stated she has not been refusing meds. She also has thrombocytosis and has had right iliofemoral thrombectomy and left fempop by Dr Donnetta Hutching in 2002. Other medical problems include hypertension hyperlipidemia, parkinson's, multiple chronic UTIs.  Interval Events:  I saw [facility] this morning. She remained sleepy and did not arouse during my examination. Spoke with bedside nurse reports a been having continued difficulty with her taking her medications due to her somnolence. She reports that her daughter has called to check on her this morning and was hoping to talk to me regarding medication I mentioned yesterday that  may help with her somnolence.  Yesterday we had discussed that I was concerned her mother may have components of a hypoactive delirium. We discussed that while there is no well-established pharmacologic treatment for this, low doses of stimulant medication have been shown to be helpful in some trials. After reviewing her chart again this morning, I called and discussed again with her daughter.   I am concerned about trial of methylphenidate due to the fact the patient has history of atrial fibrillation, elevated platelets (950 this AM), and had not been receiving apixaban  or hydroxyurea due to her lethargy. She is currently rate controlled, and I am afraid that addition of a stimulant may be precipitating event to send her into RVR which would place her at additional risk of developing clot. Would therefore not recommend trial of methylphenidate this time.  I also attempted to discuss again the frailty of her mother's condition, the fact that her decline is likely due to advancement of her chronic disease, and that there may not be a "fixable" problem. Her daughter endorsed again that she felt that this was too rapid of a decline to be progression of her mother's chronic diseases. She relayed to me that she was seen by a specialist (I believe she said in [place]) around 6 months ago who told her they estimated her mother had a life expectancy of 1-1/2 years. I attempted to explain that with her frail condition and her mother is someone who can give very sick very quickly, and that would change her overall prognosis. She reports that she thinks her mother just needs to receive her medications and she will get better on her own and she asked that I touch base the primary hospitalist regarding this. She reports the plan was to convert her to all IV medications today if she was still not taking her medications by mouth.  - We did not discuss discharge planning specifically today as the patient's daughter reports she needs to be on medications prior to making any decisions about where she will go for discharge. As of our conversation yesterday, family is still trying to process options for  discharge including back to [facility] with or without hospice support. My recommendation was to pursue placement at [facility] with hospice support. If her nutritional status then did not improve and it appeared that she had limited life expectancy in the 2-4 week range, I advised them that she would be eligible for placement at [facility]. At that time, her daughter was very surprised that  her prognosis would possibly be so short. I have been attempting to relay to them that her nutritional status remains poor, and in her frail state this may be a terminal event.   Length of Stay: 5 days  Current Medications: Scheduled Meds:  . acetaminophen  650 mg Oral TID  . antiseptic oral rinse  7 mL Mouth Rinse BID  . apixaban  2.5 mg Oral BID  . atorvastatin  10 mg Oral q1800  . calcium-vitamin D  1 tablet Oral BID  . ferrous sulfate  325 mg Oral BID WC  . hydroxyurea  1,000 mg Oral Q breakfast  . levothyroxine  200 mcg Oral QAC breakfast  . losartan  100 mg Oral Daily  . multivitamin with minerals  5 mL Oral Daily  . pantoprazole  40 mg Oral Daily  . senna-docusate  1 tablet Oral QPM  . sodium chloride  1 spray Each Nare Daily    Continuous Infusions: . dextrose 5 % and 0.45 % NaCl with KCl 20 mEq/L 75 mL/hr at 04/16/15 2253    PRN Meds: guaiFENesin, methocarbamol, ondansetron **OR** ondansetron (ZOFRAN) IV  Palliative Performance Scale: 20-30%     Vital Signs: BP 142/78 mmHg  Pulse 93  Temp(Src) 99.1 F (37.3 C) (Axillary)  Resp 20  Wt 57 kg (125 lb 10.6 oz)  SpO2 100% SpO2: SpO2: 100 % O2 Device: O2 Device: Not Delivered O2 Flow Rate:    Intake/output summary:   Intake/Output Summary (Last 24 hours) at 04/17/15 0836 Last data filed at 04/17/15 0600  Gross per 24 hour  Intake 533.75 ml  Output      0 ml  Net 533.75 ml   LBM:   Baseline Weight: Weight: 51 kg (112 lb 7 oz) Most recent weight: Weight: 57 kg (125 lb 10.6 oz)  Physical Exam:  Gen.: Weak frail elderly AA female, did not open eyes to verbal or tactile stimulation but she did move her arms around when I was examining her,  NAD Lungs: Clear anteriorly, shallow breaths CV: Intermittently irregular, S1 S2 Abdomen: soft Ext: Gangrene R foot digits           Additional Data Reviewed: Recent Labs     04/16/15  1852  WBC  5.4  HGB  11.7*  PLT  950*  NA  136  BUN  <5*  CREATININE  0.67      Problem List:  Patient Active Problem List   Diagnosis Date Noted  . Goals of care, counseling/discussion   . Encounter for palliative care   . 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 Ischemia of foot 04/12/2015  . Advanced dementia 04/01/2015  . Palliative care encounter 09/08/2014  . Decreased appetite 09/08/2014  . Thrombocythemia 09/03/2014  . Acute lower GI bleeding 08/31/2014  . GI bleed 08/31/2014  . Cerumen impaction 07/14/2014  . Paroxysmal atrial fibrillation 06/26/2014  . 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 Dementia with Parkinsonism 06/26/2014  . Recurrent UTI 05/19/2014  . Insomnia 12/18/2013  . Constipation 12/18/2013  . Dementia without behavioral disturbance 09/23/2013  .  GERD (gastroesophageal reflux disease) 09/23/2013  . Dyslipidemia 09/23/2013  . Weakness generalized 09/23/2013  . UTI (lower urinary tract infection) 09/16/2013  . FTT (failure to thrive) in adult 09/16/2013  . Facial erythema 09/16/2013  . Chronic anticoagulation 09/16/2013  . Essential hypertension, benign 07/06/2013  . Abnormal EKG 07/05/2013  . Essential thrombocytosis 07/05/2013  . Hypothyroidism 07/05/2013  . Peripheral vascular disease, unspecified 05/08/2012     Palliative Care Assessment & Plan    Code Status:  DNR  Goals of Care: I did have a lengthy conversation with patient's family regarding goals of care and planning for discharge yesterday. My recommendation was to consider discharge back to [facility] with additional support of hospice. The patient's daughter was unwilling to discuss further today she feels her mother needs to be started on IV medications prior to considering any plans for discharge. I told her I would reach out to Dr. Wendee Beavers to discuss further.  Symptom Management:  Pain: Patient does not appear to show any signs of acute pain today. Would recommend continue same regimen of Tylenol with Vicodin as needed for severe  pain.  Sleepiness: I would consider it highly likely this is a hypoactive delirium. I had spoken with Dr. Wendee Beavers about trial of methylphenidate to see if this improved, however with her comorbid conditions of atrial fibrillation and elevated platelets, complicated by the fact she has not been taking her medications, I would be concerned that this may cause her to progress to A. fib with RVR (which would increase her risk for clot even further) and would therefore not pursue at this time.  Psycho-social/Spiritual:  Desire for further Chaplaincy support:no   Prognosis: < 6 months Discharge Planning: SNF with hospice versus inpatient hospice versus home with hospice. Care management consult placed for Hospice discussions.   Care plan was discussed with Patient's daughter.  Thank you for allowing the Palliative Medicine Team to assist in the care of this patient.   Time In: 0750 Time Out: 0810 Total Time 20 Prolonged Time Billed no    Greater than 50%  of this time was spent counseling and coordinating care related to the above assessment and plan.   Micheline Rough, MD  04/17/2015, 8:36 AM  Please contact Palliative Medicine Team phone at [phone] for questions and concerns.

## 2015-04-17 NOTE — Clinical Social Work Note (Signed)
Clinical Social Work Assessment  Patient Details  Name: Tanya Farmer MRN: [id number] Date of Birth: 09/14/1925  Date of referral:  04/13/15               Reason for consult:  Facility Placement                Permission sought to share information with:  Other (Patient unable to give consent. Her daughter Trey Paula is family contact.) Permission granted to share information::     Name::     Tanya Farmer  Agency::     Relationship::  Daughter  Contact Information:     Housing/Transportation Living arrangements for the past 2 months:  Gobles of Information:  Adult Children Patient Interpreter Needed:  None Criminal Activity/Legal Involvement Pertinent to Current Situation/Hospitalization:  No - Comment as needed Significant Relationships:  Adult Children, Other Family Members Lives with:  Facility Resident Do you feel safe going back to the place where you live?  No (Daughter was not happy with the care/attention provided to patient.) Need for family participation in patient care:  Yes (Comment)  Care giving concerns:  Not discussed with daughter. Daughter was concerned about patient's care at the skilled facility.  Social Worker assessment / plan:  On 8/15 CSW talked with daughter about patient's current medical condition, and events at the skilled facility prior to this hospitalization. Daughter very concerned about patient darkened toes and her leg that she holds up at the knee. Daughter also concerned that patient is not eating and is willing to help feed her.  Mrs. Farmer informed that CSW will continue to follow her mother's progress and will assist as needed.   Employment status:  Retired Forensic scientist:  Medicaid In [place], [place] PT Recommendations:  [place] / Referral to community resources:  Other (Comment Required) (Not needed or requested at this time)  Patient/Family's Response to care:  Daughter  voiced concern regarding patient getting the appropriate care while hospitalized.  Patient/Family's Understanding of and Emotional Response to Diagnosis, Current Treatment, and Prognosis:  Not discussed on the 15th.  Emotional Assessment Appearance:  Appears stated age Attitude/Demeanor/Rapport:  Unable to Assess (Patient asleep during visit.) Affect (typically observed):    Orientation:  Oriented to Self Alcohol / Substance use:  Never Used Psych involvement (Current and /or in the community):  No (Comment)  Discharge Needs  Concerns to be addressed:  Discharge Planning Concerns Readmission within the last 30 days:  No Current discharge risk:  None Barriers to Discharge:  No Barriers Identified   Sable Feil, LCSW 04/17/2015, 4:44 PM

## 2015-04-17 NOTE — Progress Notes (Signed)
Speech Language Pathology Treatment: Dysphagia  Patient Details Name: Tanya Farmer MRN: [id number] DOB: Nov 12, 1925 Today's Date: 04/17/2015 Time: 1540-0867 SLP Time Calculation (min) (ACUTE ONLY): 59 min  Assessment / Plan / Recommendation Clinical Impression  Pt seen for reassessment of swallowing abilities per MD request. Per family request, SLP provided 15 minute notice of visit to allow family to attempt waking pt before session begins. Upon SLP arrival, pt remains lethargic and does not open her mouth or show signs of any volitional oral movement despite thermal, tactile, and gustatory stimulation from therapist or daughter.   Per RN, pt has not been awake enough for safe PO intake throughout the day, and daughter has attempted to provide POs that were orally held, making them at high risk of being aspirated. Diet order has remained in place this week to allow offerings of food when pt is more alert as diet was initially deemed appropriate by evaluating SLP. My recommendations are now for pt to be kept NPO, as pt is a very high aspiration risk if fed inappropriately. Reviewed risks of aspiration with family.  A lengthy discussion was had with daughter, son, and granddaughter, who are not accepting of this recommendation. RN present for conversation. They have concerns that pt has been getting progressively weaker due to lack of nutrition. They are not wanting to consider NGT at this time, and believe that she wakes up intermittently throughout the day. RN reports that their staff have not seen these moments. Given that family has strongly voiced that they do not want their mother to be NPO, will continue Dys 1 diet and thin liquids but insist that staff is present for any attempted PO trials. Family is agreeable and willing to call their nurse when they believe the patient is ready to trial POs.  SLP also reviewed that concern for safe swallowing stems from lethargy, as daughter present asserts  that the pt was swallowing well PTA. She continues to request a "stimulus" that will give her more energy. Encouraged her to continue conversation with her medical team. Also encouraged further discussion with the team regarding [place], as pt is not alert enough for safe PO intake and yet family is wanting to continue with PO diet.   Family voices concerns that pt would perform better if she were OOB in the chair, or if SLP came at other times during the day. Reminded family to call RN any time that pt is more alert in order to try POs at that time (RN indicates that they were already instructed to do so). RN also agreeable to coordinating a time on next date for pt to be transferred OOB to chair prior to SLP visit. Will attempt to see pt tomorrow when she is OOB per family wishes, however reminded family that aspiration risk is very high at any point that pt is not fully awake.   HPI Other Pertinent Information: Tanya Farmer is a 81 y.o. female with history of atrial fibrillation, chronic kidney disease stage III, hypertension, hypothyroidism and advanced dementia was brought to the ER after patient was found to have darkening of her right foot fifth toe. No recent CXR.  08/2014 with cognitive based dysphagia with recommendations for Dys 3 and thin liquids   Pertinent Vitals Pain Assessment: Faces Faces Pain Scale: No hurt  SLP Plan  Continue with current plan of care    Recommendations Diet recommendations: Dysphagia 1 (puree);Thin liquid;Other(comment) (SLP recommends NPO, diet above per family wishes) Liquids provided via:  Cup;Straw Medication Administration: Crushed with puree Supervision: Staff to assist with self feeding;Full supervision/cueing for compensatory strategies Compensations: Slow rate;Small sips/bites Postural Changes and/or Swallow Maneuvers: Upright 30-60 min after meal;Seated upright 90 degrees       Oral Care Recommendations: Oral care QID Follow up Recommendations: Skilled  Nursing facility Plan: Continue with current plan of care    Germain Osgood, M.A. CCC-SLP [phone]  Germain Osgood 04/17/2015, 3:24 PM

## 2015-04-17 NOTE — Progress Notes (Signed)
Patient Demographics   Tanya Farmer, is a 81 y.o. female, DOB - 01-02-1926, JSE:[id number] Admit date - 04/12/2015   Admitting Physician Rise Patience, MD Outpatient Primary MD for the patient is Blanchie Serve, MD LOS - 5    Assessment  & Plan :    1. Ischemic right foot- seen by vascular surgery, plan for conservative approach. Heparin IV while not able to take her oral anticoagulation medication. After vicodin has been hypersomnolent. As such will scaled back pain medication to acetaminophen 2. Atrial fibrillation- heart rate is controlled, continue anticoagulation with IV heparin while unable to swallow medication due to hypersomnolence. 3. Hypertension- Well controlled off antihypertensive regimen. 4. Thrombocytosis- continue hydroxyurea once patient able to tolerate oral regimen. 5. H/o dementia- stable 6. Goals of care- palliative care following 7. Altered mental status: Ammonia level and TSH elevated. Will start IV synthroid and administer lactulose enema.   Code Status: DNR Family Communication: discussed with daughter Disposition Plan: hospice vs SNF   Consultants: Palliative care Vascular surgery  Procedures: None  Antibiotics: None     Subjective:/ HPI  81 y.o. female with history of atrial fibrillation on the Apixaban, chronic kidney disease stage III, hypertension, hypothyroidism and advanced dementia was brought to the ER after patient was found to have darkening of her right foot fifth toe. As per the family patient had not been doing well last few days and has been noticed to have moving her right lower extremity more than normal. Patient's family initially noticed some redness of the toe. Patient is not very communicative because of her dementia. Patient was brought to the ER with concerns of acute ischemic limb and vascular surgeon Dr. Oneida Alar has seen and evaluated the  patient and at this time feels patient is best treated conservatively given her comorbidities.   No new complaints. No acute issues overnight.   Objective:    Filed Vitals:   04/17/15 1000  BP: 140/76  Pulse: 82  Temp: 98.2 F (36.8 C)  Resp: 20    Intake/Output Summary (Last 24 hours) at 04/17/15 1708 Last data filed at 04/17/15 1300  Gross per 24 hour  Intake 533.75 ml  Output      0 ml  Net 533.75 ml   Filed Weights   04/13/15 0021 04/13/15 2050 04/16/15 2133  Weight: 51 kg (112 lb 7 oz) 56 kg (123 lb 7.3 oz) 57 kg (125 lb 10.6 oz)      Exam:   General:  Appears in moderate distress, holding right lower extremity, facial grimacing on palpation of the right foot  Cardiovascular: S1S2 RRR  Respiratory: Clear bilaterally, no increased wob  Abdomen: Soft, non-tender, no organomegaly  Musculoskeletal: black right fifth toe, foot tender to palpation      Data Review:   Micro Results Recent Results (from the past 240 hour(s))  MRSA PCR Screening     Status: None   Collection Time: 04/13/15  3:50 AM  Result Value Ref Range Status   MRSA by PCR NEGATIVE  NEGATIVE Final    Comment:        The GeneXpert MRSA Assay (FDA approved for NASAL specimens only), is one component of a comprehensive MRSA colonization surveillance program. It is not intended to diagnose MRSA infection nor to guide or monitor treatment for MRSA infections.     Radiology Reports Dg Foot Complete Right  04/12/2015   CLINICAL DATA:  Discoloration of the fourth and fifth toes, no known injury, initial encounter  EXAM: RIGHT FOOT COMPLETE - 3+ VIEW  COMPARISON:  None.  FINDINGS: There is no evidence of fracture or dislocation. There is no evidence of arthropathy or other focal bone abnormality. Soft tissues are unremarkable.  IMPRESSION: No acute abnormality noted.   Electronically Signed   By: Inez Catalina M.D.   On: 04/12/2015 20:28     CBC  Recent Labs Lab 04/12/15 1936  04/13/15 0427 04/16/15 1852  WBC 6.1 5.7 5.4  HGB 12.9 11.3* 11.7*  HCT 39.4 35.6* 35.6*  PLT 899* 879* 950*  MCV 94.9 95.2 93.4  MCH 31.1 30.2 30.7  MCHC 32.7 31.7 32.9  RDW 19.3* 19.3* 18.6*  LYMPHSABS 1.1 1.0  --   MONOABS 0.4 0.3  --   EOSABS 0.1 0.1  --   BASOSABS 0.1 0.1  --     Chemistries   Recent Labs Lab 04/12/15 1936 04/13/15 0427 04/14/15 0513 04/16/15 1852  NA 139 140 138 136  K 3.2* 3.2* 4.1 4.1  CL 105 103 106 106  CO2 25 25 25  21*  GLUCOSE 107* 95 100* 150*  BUN 7 8 8  <5*  CREATININE 0.69 0.72 0.70 0.67  CALCIUM 8.9 8.8* 8.6* 8.6*  AST  --  27  --  27  ALT  --  17  --  18  ALKPHOS  --  53  --  55  BILITOT  --  0.6  --  0.5   ------------------------------------------------------------------------------------------------------------------ estimated creatinine clearance is 39.2 mL/min (by C-G formula based on Cr of 0.67). ------------------------------------------------------------------------------------------------------------------    Time Spent in minutes   25 min   Velvet Bathe M.D on 04/17/2015 at 5:08 PM  Between 7am to 7pm - Pager - [phone] After 7pm go to [email or web address] - password [facility]  [facility] -  Office  [phone]

## 2015-04-17 NOTE — Progress Notes (Signed)
ANTICOAGULATION CONSULT NOTE - Initial Consult  Pharmacy Consult for heparin Indication: atrial fibrillation  No Known Allergies  Patient Measurements: Weight: 125 lb 10.6 oz (57 kg) Heparin Dosing Weight: 57 kg  Vital Signs: Temp: 98.2 F (36.8 C) (08/19 1000) Temp Source: Oral (08/19 1000) BP: 140/76 mmHg (08/19 1000) Pulse Rate: 82 (08/19 1000)  Labs:  Recent Labs  04/16/15 1852  HGB 11.7*  HCT 35.6*  PLT 950*  CREATININE 0.67    Estimated Creatinine Clearance: 39.2 mL/min (by C-G formula based on Cr of 0.67).   Medical History: Past Medical History  Diagnosis Date  . Hypertension   . Hyperlipidemia   . Thyroid disease     Hypothyroidism  . GERD (gastroesophageal reflux disease)   . Arthritis     Degenerative  . Peripheral vascular disease     Left   Fem-Pop and  tibial Thrombectomy  . High platelet count   . Dementia   . Pain in toe of right foot   . Hypothyroidism   . Long term (current) use of anticoagulants 03/13/2014  . Neuromuscular disorder   . Parkinson's disease     Assessment: 81 yo F presents on 8/15 with black R foot 5th toe. Pt has been lethargic while inpatient and has not been taking any of her oral medications. To transition meds to IV while speech evaluates her and then will decide whether or not to place a feeding tube. Pharmacy consulted to start heparin for Afib. Of note patient did have a history of a GIB back in January but has been on Eliquis since that time with no problems. Hgb 11.7, plts 950. No s/s of bleed.  Goal of Therapy:  Heparin level 0.3-0.7 units/ml Monitor platelets by anticoagulation protocol: Yes   Plan:  No heparin bolus Start heparin gtt at 850 units/hr Check 8hr HL Monitor daily HL, CBC, s/s of bleed   Tuan Tippin J 04/17/2015,12:31 PM

## 2015-04-17 NOTE — Progress Notes (Signed)
Pt continues to be lethargic and unable to take PO medications as well as any food or drink. MD is aware. Family expresses frustration at not being able to feed patient. Pt's ability has been assessed throughout the day by this RN and pt's family has been instructed, once again, to call RN for the periods of alertness they say pt has had. I also re-educated patient's family throughout the day about safety and the risk for aspiration. Pt's daughter and son-in-law do not seem to accept the patient's condition. Will continue to reeducate and provide emotional support.   Tyna Jaksch, RN

## 2015-04-18 LAB — CBC
HEMATOCRIT: 34.1 % — AB (ref 36.0–46.0)
HEMOGLOBIN: 11.3 g/dL — AB (ref 12.0–15.0)
MCH: 30.8 pg (ref 26.0–34.0)
MCHC: 33.1 g/dL (ref 30.0–36.0)
MCV: 92.9 fL (ref 78.0–100.0)
Platelets: 829 10*3/uL — ABNORMAL HIGH (ref 150–400)
RBC: 3.67 MIL/uL — ABNORMAL LOW (ref 3.87–5.11)
RDW: 18.6 % — AB (ref 11.5–15.5)
WBC: 6.6 10*3/uL (ref 4.0–10.5)

## 2015-04-18 LAB — HEPARIN LEVEL (UNFRACTIONATED): Heparin Unfractionated: 0.36 IU/mL (ref 0.30–0.70)

## 2015-04-18 LAB — GLUCOSE, CAPILLARY
GLUCOSE-CAPILLARY: 134 mg/dL — AB (ref 65–99)
Glucose-Capillary: 122 mg/dL — ABNORMAL HIGH (ref 65–99)
Glucose-Capillary: 99 mg/dL (ref 65–99)

## 2015-04-18 LAB — AMMONIA: AMMONIA: 46 umol/L — AB (ref 9–35)

## 2015-04-18 LAB — T3, FREE: T3 FREE: 1.4 pg/mL — AB (ref 2.0–4.4)

## 2015-04-18 MED ORDER — LABETALOL HCL 5 MG/ML IV SOLN
5.0000 mg | Freq: Four times a day (QID) | INTRAVENOUS | Status: DC | PRN
  Administered 2015-04-19: 5 mg via INTRAVENOUS
  Filled 2015-04-18: qty 4

## 2015-04-18 NOTE — Progress Notes (Signed)
Daily Progress Note   Patient Name: Tanya Farmer       Date: 04/18/2015 DOB: 1926-06-29  Age: 81 y.o. MRN#: [id number] Attending Physician: Velvet Bathe, MD Primary Care Physician: Blanchie Serve, MD Admit Date: 04/12/2015  Reason for Consultation/Follow-up: Establishing goals of care  Subjective: 81 y.o. year old female who presents for evaluation of right foot ischemia. Pt noted to have black right 5th toe by daughter at her nursing facility [facility] and was brought to ER. Pt has severe dementia and lives in SNF. She is essentially total assist and has to be fed. Daughter stated that she had gone down hill over the last week. Pt is non verbal at baseline.  Pt is on Eliquis for chronic afib and daughter stated she has not been refusing meds. She also has thrombocytosis and has had right iliofemoral thrombectomy and left fempop by Dr Donnetta Hutching in 2002. Other medical problems include hypertension hyperlipidemia, parkinson's, multiple chronic UTIs.  Interval Events:  I met with Ms. Rouse and her family today and held another family meeting regarding long-term goals of care. They report that they feel that she is very comfortable and are pleased with her care here at the hospital. They are still hopeful that she will improve, but seem to be more accepting of the fact that this may be early stages of Ms. Brownfield actively dying. See goals of care discussion below.  Length of Stay: 6 days  Current Medications: Scheduled Meds:  . acetaminophen  650 mg Oral TID  . antiseptic oral rinse  7 mL Mouth Rinse BID  . atorvastatin  10 mg Oral q1800  . calcium-vitamin D  1 tablet Oral BID  . ferrous sulfate  325 mg Oral BID WC  . hydroxyurea  1,000 mg Oral Q breakfast  . levothyroxine  100 mcg Intravenous Daily  . multivitamin with minerals  5 mL Oral Daily  . pantoprazole (PROTONIX) IV  40 mg Intravenous Q24H  . senna-docusate  1 tablet Oral QPM  . sodium chloride  1 spray Each Nare Daily     Continuous Infusions: . dextrose 5 % and 0.45 % NaCl with KCl 20 mEq/L 75 mL/hr at 04/18/15 0626  . heparin 850 Units/hr (04/17/15 1744)    PRN Meds: guaiFENesin, labetalol, methocarbamol, ondansetron **OR** ondansetron (ZOFRAN) IV  Palliative Performance Scale: 20-30%     Vital Signs: BP 151/85 mmHg  Pulse 101  Temp(Src) 100.7 F (38.2 C) (Oral)  Resp 17  Ht '5\' 3"'  (1.6 m)  Wt 55 kg (121 lb 4.1 oz)  BMI 21.48 kg/m2  SpO2 99% SpO2: SpO2: 99 % O2 Device: O2 Device: Not Delivered O2 Flow Rate:    Intake/output summary:   Intake/Output Summary (Last 24 hours) at 04/18/15 1919 Last data filed at 04/18/15 1800  Gross per 24 hour  Intake 1156.27 ml  Output      4 ml  Net 1152.27 ml   LBM:   Baseline Weight: Weight: 51 kg (112 lb 7 oz) Most recent weight: Weight: 55 kg (121 lb 4.1 oz)  Physical Exam:  Gen.: Weak frail elderly AA female, did not open eyes to verbal or tactile stimulation but she did move her arms around when I was examining her,  NAD Lungs: Clear anteriorly, shallow breaths CV: Intermittently irregular, S1 S2 Abdomen: soft Ext: Gangrene R foot digits           Additional Data Reviewed: Recent Labs     04/16/15  1852  04/18/15  0422  WBC  5.4  6.6  HGB  11.7*  11.3*  PLT  950*  829*  NA  136   --   BUN  <5*   --   CREATININE  0.67   --      Problem List:  Patient Active Problem List   Diagnosis Date Noted  . Goals of care, counseling/discussion   . Encounter for palliative care   . 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 Ischemia of foot 04/12/2015  . Advanced dementia 04/01/2015  . Palliative care encounter 09/08/2014  . Decreased appetite 09/08/2014  . Thrombocythemia 09/03/2014  . Acute lower GI bleeding 08/31/2014  . GI bleed 08/31/2014  . Cerumen impaction 07/14/2014  . Paroxysmal atrial fibrillation 06/26/2014  . 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 Dementia with Parkinsonism 06/26/2014  . Recurrent UTI  05/19/2014  . Insomnia 12/18/2013  . Constipation 12/18/2013  . Dementia without behavioral disturbance 09/23/2013  . GERD (gastroesophageal reflux disease) 09/23/2013  . Dyslipidemia 09/23/2013  . Weakness generalized 09/23/2013  . UTI (lower urinary tract infection) 09/16/2013  . FTT (failure to thrive) in adult 09/16/2013  . Facial erythema 09/16/2013  . Chronic anticoagulation 09/16/2013  . Essential hypertension, benign 07/06/2013  . Abnormal EKG 07/05/2013  . Essential thrombocytosis 07/05/2013  . Hypothyroidism 07/05/2013  . Peripheral vascular disease, unspecified 05/08/2012     Palliative Care Assessment & Plan    Code Status:  DNR  Goals of Care: Held discussion with additional family present today. I expressed again my concern that we have looked at reversible causes of her somnolence and that her nutritional status, cognition, and functional status have all significantly declined. I expressed concern that the family needs to continue to process the fact that this may very well be beginning stages of her actively dying. Her daughter reports that she woke up and ate half of her breakfast this morning. I told her that I found this encouraging, and was hopeful that her mother would continue to have good days. We also discussed with the chronicity of her medical problems, including advanced dementia, that she would continue to decline physically. We had a long discussion regarding nutrition and nutritional status. We also began discussion regarding if placement of a feeding tube would be in line with the patient's wishes and goals. I expressed to them concerns over this and that if, in fact, it was decided to proceed with feeding tube that I would recommend limits be placed as far as being a time-limited trial with specific goals in mind to see if she does improve. Her daughter reports that she is not sure that her mother would want proceed down route of feeding tube. I will  follow-up tomorrow and continue discussion.  We also briefly discussed her stay here in the hospital. Her daughter reports she is been very pleased with the care that she has received. She states that if her mother is reaching the end of her life, she wants to do everything she can to ensure her comfort. I advised her that if her mother is not able to maintain her nutritional status, which appears to be the case, I would recommend considering placement in a residential hospice facility where she could receive the high level of care they have been hoping to find in an outpatient setting. She again stated how appreciative she has been for the care that her mother has received in the hospital and that she does not want to move  her if she does not have to because she feels that the care has been so good.  Symptom Management:  Pain: Patient does not appear to show any signs of acute pain today. Would recommend continue same regimen of Tylenol with Vicodin as needed for severe pain.  Somnolence: I fear this is an irreversible process that may be her transitioning to early stages of actively dying. We discussed again at length that we have done tests to look for any reversible processes. I shared with the family again my concerns that this is beginning of dying process for the patient. Family expressed understanding my concern but remain hopeful that she will recover.  Psycho-social/Spiritual:  Desire for further Chaplaincy support:no   Prognosis: < 6 months Discharge Planning: SNF with hospice versus inpatient hospice versus home with hospice. Care management consult placed for Hospice discussions.   Care plan was discussed with Patient's daughter, her son-in-law, her granddaughter and multiple other family members.  Thank you for allowing the Palliative Medicine Team to assist in the care of this patient.   Time In: 1800 Time Out: 1910 Total Time 70 Prolonged Time Billed no    Greater than 50%   of this time was spent counseling and coordinating care related to the above assessment and plan.   Micheline Rough, MD  04/18/2015, 7:19 PM  Please contact Palliative Medicine Team phone at [phone] for questions and concerns.

## 2015-04-18 NOTE — Progress Notes (Signed)
Patient Demographics   Tanya Farmer, is a 81 y.o. female, DOB - October 17, 1925, INO:[id number] Admit date - 04/12/2015   Admitting Physician Rise Patience, MD Outpatient Primary MD for the patient is Blanchie Serve, MD LOS - 6    Assessment  & Plan :    1. Ischemic right foot- seen by vascular surgery, plan for conservative approach. Heparin IV while not able to take her oral anticoagulation medication. Vicodin discontinued. As such will scaled back pain medication to acetaminophen 2. Atrial fibrillation- heart rate is controlled, continue anticoagulation with IV heparin while unable to swallow medication due to hypersomnolence. 3. Hypertension- Well controlled off antihypertensive regimen. 4. Thrombocytosis- continue hydroxyurea once patient able to tolerate oral regimen. 5. H/o dementia- stable 6. Goals of care- palliative care following 7. Altered mental status: Ammonia level and TSH elevated. IV synthroid started.  Lactulose administered yesterday 04/17/15   Code Status: DNR Family Communication: discussed with daughter Disposition Plan: hospice vs SNF   Consultants: Palliative care Vascular surgery  Procedures: None  Antibiotics: None     Subjective:/ HPI  81 y.o. female with history of atrial fibrillation on the Apixaban, chronic kidney disease stage III, hypertension, hypothyroidism and advanced dementia was brought to the ER after patient was found to have darkening of her right foot fifth toe. As per the family patient had not been doing well last few days and has been noticed to have moving her right lower extremity more than normal. Patient's family initially noticed some redness of the toe. Patient is not very communicative because of her dementia. Patient was brought to the ER with concerns of acute ischemic limb and vascular surgeon Dr. Oneida Alar has seen and evaluated the patient and at this time feels patient is best  treated conservatively given her comorbidities.   No new complaints. No acute issues overnight.   Objective:    Filed Vitals:   04/18/15 1636  BP: 151/85  Pulse: 101  Temp: 100.7 F (38.2 C)  Resp: 17    Intake/Output Summary (Last 24 hours) at 04/18/15 1647 Last data filed at 04/18/15 0962  Gross per 24 hour  Intake 154.27 ml  Output      4 ml  Net 150.27 ml   Filed Weights   04/13/15 2050 04/16/15 2133 04/17/15 2021  Weight: 56 kg (123 lb 7.3 oz) 57 kg (125 lb 10.6 oz) 55 kg (121 lb 4.1 oz)      Exam:   General:  Appears in moderate distress, holding right lower extremity, facial grimacing on palpation of the right foot  Cardiovascular: S1S2 RRR  Respiratory: Clear bilaterally, no increased wob  Abdomen: Soft, non-tender, no organomegaly  Musculoskeletal: black right fifth toe, foot tender to palpation      Data Review:   Micro Results Recent Results (from the past 240 hour(s))  MRSA PCR Screening     Status: None   Collection Time: 04/13/15  3:50 AM  Result Value Ref Range Status   MRSA by PCR NEGATIVE NEGATIVE Final    Comment:        The GeneXpert MRSA Assay (FDA approved for NASAL specimens only), is one component of a comprehensive MRSA colonization surveillance program. It is not intended to diagnose MRSA  infection nor to guide or monitor treatment for MRSA infections.     Radiology Reports Dg Foot Complete Right  04/12/2015   CLINICAL DATA:  Discoloration of the fourth and fifth toes, no known injury, initial encounter  EXAM: RIGHT FOOT COMPLETE - 3+ VIEW  COMPARISON:  None.  FINDINGS: There is no evidence of fracture or dislocation. There is no evidence of arthropathy or other focal bone abnormality. Soft tissues are unremarkable.  IMPRESSION: No acute abnormality noted.   Electronically Signed   By: Inez Catalina M.D.   On: 04/12/2015 20:28     CBC  Recent Labs Lab 04/12/15 1936 04/13/15 0427 04/16/15 1852 04/18/15 0422  WBC  6.1 5.7 5.4 6.6  HGB 12.9 11.3* 11.7* 11.3*  HCT 39.4 35.6* 35.6* 34.1*  PLT 899* 879* 950* 829*  MCV 94.9 95.2 93.4 92.9  MCH 31.1 30.2 30.7 30.8  MCHC 32.7 31.7 32.9 33.1  RDW 19.3* 19.3* 18.6* 18.6*  LYMPHSABS 1.1 1.0  --   --   MONOABS 0.4 0.3  --   --   EOSABS 0.1 0.1  --   --   BASOSABS 0.1 0.1  --   --     Chemistries   Recent Labs Lab 04/12/15 1936 04/13/15 0427 04/14/15 0513 04/16/15 1852  NA 139 140 138 136  K 3.2* 3.2* 4.1 4.1  CL 105 103 106 106  CO2 25 25 25  21*  GLUCOSE 107* 95 100* 150*  BUN 7 8 8  <5*  CREATININE 0.69 0.72 0.70 0.67  CALCIUM 8.9 8.8* 8.6* 8.6*  AST  --  27  --  27  ALT  --  17  --  18  ALKPHOS  --  53  --  55  BILITOT  --  0.6  --  0.5   ------------------------------------------------------------------------------------------------------------------ estimated creatinine clearance is 41 mL/min (by C-G formula based on Cr of 0.67). ------------------------------------------------------------------------------------------------------------------    Time Spent in minutes   25 min   Velvet Bathe M.D on 04/18/2015 at 4:47 PM  Between 7am to 7pm - Pager - [phone] After 7pm go to [email or web address] - password [facility]  [facility] -  Office  [phone]

## 2015-04-18 NOTE — Progress Notes (Signed)
ANTICOAGULATION CONSULT NOTE - Initial Consult  Pharmacy Consult for heparin Indication: atrial fibrillation  No Known Allergies  Patient Measurements: Height: 5\' 3"  (160 cm) Weight: 121 lb 4.1 oz (55 kg) IBW/kg (Calculated) : 52.4 Heparin Dosing Weight: 57 kg  Vital Signs: Temp: 98.7 F (37.1 C) (08/20 0900) Temp Source: Oral (08/20 0900) BP: 183/110 mmHg (08/20 0900) Pulse Rate: 115 (08/20 0900)  Labs:  Recent Labs  04/16/15 1852 04/17/15 2131 04/18/15 0422  HGB 11.7*  --  11.3*  HCT 35.6*  --  34.1*  PLT 950*  --  829*  HEPARINUNFRC  --  0.40 0.36  CREATININE 0.67  --   --     Estimated Creatinine Clearance: 41 mL/min (by C-G formula based on Cr of 0.67).   Medical History: Past Medical History  Diagnosis Date  . Hypertension   . Hyperlipidemia   . Thyroid disease     Hypothyroidism  . GERD (gastroesophageal reflux disease)   . Arthritis     Degenerative  . Peripheral vascular disease     Left   Fem-Pop and  tibial Thrombectomy  . High platelet count   . Dementia   . Pain in toe of right foot   . Hypothyroidism   . Long term (current) use of anticoagulants 03/13/2014  . Neuromuscular disorder   . Parkinson's disease     Assessment: AC: Apixaban PTA for afib. To transition meds to IV while speech evaluates her and then will decide whether or not to place a feeding tube. Pharmacy consulted to start heparin for Afib. H/O GIB 1/16 but has been on Eliquis since then with no problems. Hgb 11.3, plts 829. No s/s of bleed.8/20: HL 0.36.   Goal of Therapy:  Heparin level 0.3-0.7 units/ml Monitor platelets by anticoagulation protocol: Yes   Plan:  Continue heparin at 850 units/hr,  Hitesh Fouche S. Alford Highland, PharmD, BCPS Clinical Staff Pharmacist Pager [phone]  Eilene Ghazi Stillinger 04/18/2015,11:39 AM

## 2015-04-18 NOTE — Progress Notes (Signed)
Initial Nutrition Assessment   INTERVENTION:   Magic cup TID with meals, each supplement provides 290 kcal and 9 grams of protein  If family desires TF and feeding tube placed recommend: Initiate Osmolite 1.2 @ 20 ml/hr and increase by 10 ml every 8 hours to goal rate of 50 ml/hr  Provides: 1440 kcal, 66 grams protein, and 984 ml H2O   NUTRITION DIAGNOSIS:   Inadequate oral intake related to lethargy/confusion as evidenced by meal completion < 50%.   GOAL:   Patient will meet greater than or equal to 90% of their needs   MONITOR:   PO intake  REASON FOR ASSESSMENT:   Consult Enteral/tube feeding initiation and management  ASSESSMENT:   81 y.o. female with history of atrial fibrillation on the Apixaban, chronic kidney disease stage III, hypertension, hypothyroidism and advanced dementia was brought to the ER after patient was found to have darkening of her right foot fifth toe. As per the family patient had not been doing well last few days and has been noticed to have moving her right lower extremity more than normal. Patient's family initially noticed some redness of the toe. Patient is not very communicative because of her dementia. Patient was brought to the ER with concerns of acute ischemic limb and vascular surgeon Dr. Oneida Alar has seen and evaluated the patient and at this time feels patient is best treated conservatively given her comorbidities.   Per review of chart pt has had weight loss since her last admission jan 2016.  Pt is lethargic and has not ate during this admission (6 days), per chart pt did eat 50% this am. Unable to complete physical exam at this time.  Spoke with RN who reports that family does not want any feeding tube and pt does not have one placed presently. Pt did wake for breakfast this am but again is lethargic. Will leave TF recommendations if TF desired by family.   Diet Order:  DIET - DYS 1 Room service appropriate?: Yes; Fluid consistency::  Thin  Skin:  Wound (see comment) (ischemic right foot)  Last BM:  8/20  Height:   Ht Readings from Last 1 Encounters:  04/17/15 5\' 3"  (1.6 m)    Weight:   Wt Readings from Last 1 Encounters:  04/17/15 121 lb 4.1 oz (55 kg)    Ideal Body Weight:  52.2 kg  BMI:  Body mass index is 21.48 kg/(m^2).  Estimated Nutritional Needs:   Kcal:  1300-1500  Protein:  65-75 grams  Fluid:  > 1.5 L/day  EDUCATION NEEDS:   No education needs identified at this time  [place], [place], [place] Pager [phone] After Hours Pager

## 2015-04-18 NOTE — Progress Notes (Signed)
Spoke with Lance Sell, patient's daughter, she is refusing tube placement at this time.  MD notified.

## 2015-04-18 NOTE — Progress Notes (Signed)
ANTICOAGULATION CONSULT NOTE - Follow Up Consult  Pharmacy Consult for Heparin  Indication: atrial fibrillation  No Known Allergies  Patient Measurements: Height: 5\' 3"  (160 cm) Weight: 121 lb 4.1 oz (55 kg) IBW/kg (Calculated) : 52.4  Vital Signs: Temp: 98.7 F (37.1 C) (08/19 2021) Temp Source: Oral (08/19 2021) BP: 134/81 mmHg (08/19 2021) Pulse Rate: 89 (08/19 2021)  Labs:  Recent Labs  04/16/15 1852 04/17/15 2131  HGB 11.7*  --   HCT 35.6*  --   PLT 950*  --   HEPARINUNFRC  --  0.40  CREATININE 0.67  --     Estimated Creatinine Clearance: 41 mL/min (by C-G formula based on Cr of 0.67).   Assessment: Initial heparin level is therapeutic   Goal of Therapy:  Heparin level 0.3-0.7 units/ml Monitor platelets by anticoagulation protocol: Yes   Plan:  -Continue heparin at 850 units/hr -Confirmatory HL with AM labs -Daily CBC/HL -Monitor for bleeding  Narda Bonds 04/18/2015,12:52 AM

## 2015-04-19 ENCOUNTER — Inpatient Hospital Stay (HOSPITAL_COMMUNITY): Payer: Medicare Other

## 2015-04-19 DIAGNOSIS — R21 Rash and other nonspecific skin eruption: Secondary | ICD-10-CM | POA: Insufficient documentation

## 2015-04-19 LAB — CBC
HEMATOCRIT: 34.6 % — AB (ref 36.0–46.0)
Hemoglobin: 11.4 g/dL — ABNORMAL LOW (ref 12.0–15.0)
MCH: 30.2 pg (ref 26.0–34.0)
MCHC: 32.9 g/dL (ref 30.0–36.0)
MCV: 91.8 fL (ref 78.0–100.0)
PLATELETS: 804 10*3/uL — AB (ref 150–400)
RBC: 3.77 MIL/uL — AB (ref 3.87–5.11)
RDW: 18.7 % — ABNORMAL HIGH (ref 11.5–15.5)
WBC: 7 10*3/uL (ref 4.0–10.5)

## 2015-04-19 LAB — URINALYSIS, ROUTINE W REFLEX MICROSCOPIC
BILIRUBIN URINE: NEGATIVE
Glucose, UA: NEGATIVE mg/dL
KETONES UR: NEGATIVE mg/dL
LEUKOCYTES UA: NEGATIVE
NITRITE: NEGATIVE
PH: 5.5 (ref 5.0–8.0)
PROTEIN: NEGATIVE mg/dL
Specific Gravity, Urine: 1.011 (ref 1.005–1.030)
UROBILINOGEN UA: 1 mg/dL (ref 0.0–1.0)

## 2015-04-19 LAB — GLUCOSE, CAPILLARY
GLUCOSE-CAPILLARY: 120 mg/dL — AB (ref 65–99)
GLUCOSE-CAPILLARY: 111 mg/dL — AB (ref 65–99)
GLUCOSE-CAPILLARY: 121 mg/dL — AB (ref 65–99)
Glucose-Capillary: 100 mg/dL — ABNORMAL HIGH (ref 65–99)

## 2015-04-19 LAB — HEPARIN LEVEL (UNFRACTIONATED): Heparin Unfractionated: 0.35 IU/mL (ref 0.30–0.70)

## 2015-04-19 LAB — URINE MICROSCOPIC-ADD ON

## 2015-04-19 MED ORDER — LEVOFLOXACIN IN D5W 750 MG/150ML IV SOLN
750.0000 mg | Freq: Once | INTRAVENOUS | Status: AC
  Administered 2015-04-19: 750 mg via INTRAVENOUS
  Filled 2015-04-19: qty 150

## 2015-04-19 MED ORDER — LEVOFLOXACIN IN D5W 500 MG/100ML IV SOLN: 500.0000 mg | INTRAVENOUS | Status: DC

## 2015-04-19 NOTE — Progress Notes (Signed)
ANTICOAGULATION CONSULT NOTE - Follow Up Consult  Pharmacy Consult for Heparin + new Levaquin Indication: Afib/ischemic foot + FUO  No Known Allergies  Patient Measurements: Height: 5\' 3"  (160 cm) Weight: 121 lb 11.1 oz (55.2 kg) IBW/kg (Calculated) : 52.4 Heparin Dosing Weight:    Vital Signs: Temp: 98.6 F (37 C) (08/21 0740) Temp Source: Oral (08/21 0740) BP: 135/85 mmHg (08/21 0930) Pulse Rate: 89 (08/21 0930)  Labs:  Recent Labs  04/16/15 1852 04/17/15 2131 04/18/15 0422 04/19/15 0553  HGB 11.7*  --  11.3* 11.4*  HCT 35.6*  --  34.1* 34.6*  PLT 950*  --  829* 804*  HEPARINUNFRC  --  0.40 0.36 0.35  CREATININE 0.67  --   --   --     Estimated Creatinine Clearance: 41 mL/min (by C-G formula based on Cr of 0.67).   Assessment:  AC: Apixaban PTA for afib and ischemic R foot.. To transition meds to IV while speech evaluates her and then will decide whether or not to place a feeding tube. Pharmacy consulted to start heparin for Afib. H/O GIB 1/16 but has been on Eliquis since then with no problems. Hgb 11.4, plts 804. No s/s of bleed. HL 0.35 remains in goal  ID: h/o multiple chronic UTI's. FUO. Temp 100.7. WBC 7. Scr 41.   Goal of Therapy:  Heparin level 0.3-0.7 units/ml Monitor platelets by anticoagulation protocol: Yes   Plan:  PO meds not being administered Continue heparin at 850 units/hr, AM HL Levaquin 750mg  IV x 1 then 500mg  IV q48h. Monitor daily HL, CBC, s/s of bleed   Tanya Farmer S. Alford Highland, PharmD, BCPS Clinical Staff Pharmacist Pager [phone]  Tanya Farmer 04/19/2015,12:41 PM

## 2015-04-19 NOTE — Progress Notes (Signed)
Speech Language Pathology Treatment: Dysphagia  Patient Details Name: Tanya Farmer MRN: [id number] DOB: 05-27-1926 Today's Date: 04/19/2015 Time: 1210-1300 SLP Time Calculation (min) (ACUTE ONLY): 50 min  Assessment / Plan / Recommendation Clinical Impression  Patient seen for f/u to assess swallowing abilities via diagnostic treatment. Per family request, family allowed time prior to treatment to attempt to arouse patient. Despite family and SLP attempts, patient remained lethargic, largely unresponsive without volitional oral movements to indicate readiness for pos despite thermal, tactile, and gustatory stimulation. Extensive education complete with daughter and son-in-law regarding patient's current level of swallowing function and aspiration risk as it relates to dementia diagnosis and acute condition. Aspiration risk high as mentation fluctuates. Family does wish for patient to continue to be offerred pos despite aspiration risk. Provided education on safe compensatory strategies which may be utlized to assess patient's cognitive readiness for po intake and facilitate arousal including use of cold washcloth to face, ice chip to bottom lip, swab dipped in H2O only (daughter using juice upon arrival) and importance of frequent oral care, particularly before and after po intake to decrease risk of an aspiration related infection. Education complete with RN and CNA regarding above strategies as well and sign placed at [facility]. At this time, all education has been complete and family verbalizing acceptance of aspiration risk. In patient's current cognitive state and with families current decisions regarding plan of care, no further skilled SLP services can be offerred. Agree with palliative care with pleasure pos unless patient makes significant gains in function.    HPI Other Pertinent Information: Tanya Farmer is a 81 y.o. female with history of atrial fibrillation, chronic kidney disease stage III,  hypertension, hypothyroidism and advanced dementia was brought to the ER after patient was found to have darkening of her right foot fifth toe. No recent CXR.  08/2014 with cognitive based dysphagia with recommendations for Dys 3 and thin liquids   Pertinent Vitals Pain Assessment: Faces Faces Pain Scale: No hurt  SLP Plan  Discharge SLP treatment due to (comment) (current mentation)    Recommendations Diet recommendations: Dysphagia 1 (puree);Thin liquid Liquids provided via: Cup;Straw Medication Administration: Crushed with puree Supervision: Staff to assist with self feeding;Full supervision/cueing for compensatory strategies Compensations: Slow rate;Small sips/bites Postural Changes and/or Swallow Maneuvers: Upright 30-60 min after meal;Seated upright 90 degrees              Oral Care Recommendations: Oral care before and after PO;Oral care BID Follow up Recommendations: Skilled Nursing facility Plan: Discharge SLP treatment due to (comment) (current mentation)    GO   Gabriel Rainwater MA, CCC-SLP [phone]   Tanya Farmer 04/19/2015, 1:39 PM

## 2015-04-19 NOTE — Progress Notes (Signed)
Daily Progress Note   Patient Name: Tanya Farmer       Date: 04/19/2015 DOB: [birth year]  Age: 81 y.o. MRN#: [id number] Attending Physician: Velvet Bathe, MD Primary Care Physician: Blanchie Serve, MD Admit Date: 04/12/2015  Reason for Consultation/Follow-up: Establishing goals of care  Subjective: 81 y.o. year old female who presents for evaluation of right foot ischemia. Pt noted to have black right 5th toe by daughter at her nursing facility [facility] and was brought to ER. Pt has severe dementia and lives in SNF. She is essentially total assist and has to be fed. Daughter stated that she had gone down hill over the last week. Pt is non verbal at baseline.  Pt is on Eliquis for chronic afib and daughter stated she has not been refusing meds. She also has thrombocytosis and has had right iliofemoral thrombectomy and left fempop by Dr Donnetta Hutching in 2002. Other medical problems include hypertension hyperlipidemia, parkinson's, multiple chronic UTIs.  Interval Events:  Met with Ms. Loughmiller as well as several members of her family again today. Her daughter was not present at that time.  They report that she had developed a fever and was started on antibiotics today. Since that time they have noted that she has a rash on the front part of her neck as well as on her arm at the site of her infusion.  The report that she has been more awake and alert today. This is include opening her eyes several times. She still has not had any significant by mouth intake today.  She still remains nonverbal and cannot answer any questions. She does open her eyes briefly at one point during encounter, but still remains somnolent.  Length of Stay: 7 days  Current Medications: Scheduled Meds:  . acetaminophen  650 mg Oral TID  . antiseptic oral rinse  7 mL Mouth Rinse BID  . atorvastatin  10 mg Oral q1800  . calcium-vitamin D  1 tablet Oral BID  . ferrous sulfate  325 mg Oral BID WC  . hydroxyurea  1,000  mg Oral Q breakfast  . [START ON 04/20/2015] levofloxacin (LEVAQUIN) IV  500 mg Intravenous Q48H  . levothyroxine  100 mcg Intravenous Daily  . multivitamin with minerals  5 mL Oral Daily  . pantoprazole (PROTONIX) IV  40 mg Intravenous Q24H  . senna-docusate  1 tablet Oral QPM  . sodium chloride  1 spray Each Nare Daily    Continuous Infusions: . dextrose 5 % and 0.45 % NaCl with KCl 20 mEq/L 75 mL/hr at 04/18/15 2355  . heparin 850 Units/hr (04/18/15 2008)    PRN Meds: guaiFENesin, labetalol, methocarbamol, ondansetron **OR** ondansetron (ZOFRAN) IV  Palliative Performance Scale: 10%     Vital Signs: BP 135/85 mmHg  Pulse 89  Temp(Src) 98.6 F (37 C) (Oral)  Resp 18  Ht '5\' 3"'  (1.6 m)  Wt 55.2 kg (121 lb 11.1 oz)  BMI 21.56 kg/m2  SpO2 100% SpO2: SpO2: 100 % O2 Device: O2 Device: Not Delivered O2 Flow Rate:    Intake/output summary:   Intake/Output Summary (Last 24 hours) at 04/19/15 2033 Last data filed at 04/18/15 2248  Gross per 24 hour  Intake      0 ml  Output      1 ml  Net     -1 ml   LBM:   Baseline Weight: Weight: 51 kg (112 lb 7 oz) Most recent weight: Weight: 55.2 kg (121 lb 11.1 oz)  Physical  Exam:  Gen.: Weak frail elderly AA female, did open eyes once briefly to tactile stimulation and moved her arms around when I was examining her, but no purposeful interaction  NAD Lungs: Clear anteriorly, shallow breaths. No wheezing. CV: Intermittently irregular, S1 S2 Abdomen: soft Ext: Gangrene R foot digits     Skin: Area of erythema extending approximately 4 cm x 2 cm proximally from area of IV infusion in her left arm. She also has erythema noted on the front portion of her neck that extends from the jaw line bilaterally to approximately 2 cm below the sternal notch. Both areas slightly warmer to the touch than the rest of her skin. There are no palpable lesions. The area of involvement on her neck feels slightly greasy to me.       Additional Data  Reviewed: Recent Labs     04/18/15  0422  04/19/15  0553  WBC  6.6  7.0  HGB  11.3*  11.4*  PLT  829*  804*     Problem List:  Patient Active Problem List   Diagnosis Date Noted  . Goals of care, counseling/discussion   . Encounter for palliative care   . 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 Ischemia of foot 04/12/2015  . Advanced dementia 04/01/2015  . Palliative care encounter 09/08/2014  . Decreased appetite 09/08/2014  . Thrombocythemia 09/03/2014  . Acute lower GI bleeding 08/31/2014  . GI bleed 08/31/2014  . Cerumen impaction 07/14/2014  . Paroxysmal atrial fibrillation 06/26/2014  . 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 Dementia with Parkinsonism 06/26/2014  . Recurrent UTI 05/19/2014  . Insomnia 12/18/2013  . Constipation 12/18/2013  . Dementia without behavioral disturbance 09/23/2013  . GERD (gastroesophageal reflux disease) 09/23/2013  . Dyslipidemia 09/23/2013  . Weakness generalized 09/23/2013  . UTI (lower urinary tract infection) 09/16/2013  . FTT (failure to thrive) in adult 09/16/2013  . Facial erythema 09/16/2013  . Chronic anticoagulation 09/16/2013  . Essential hypertension, benign 07/06/2013  . Abnormal EKG 07/05/2013  . Essential thrombocytosis 07/05/2013  . Hypothyroidism 07/05/2013  . Peripheral vascular disease, unspecified 05/08/2012     Palliative Care Assessment & Plan    Code Status:  DNR  Goals of Care: Her daughter was not present to continue goals of care discussion today. We did have a long talk last evening and I also had left a copy of [facility] for [place] for her review. Will plan to follow-up tomorrow. Her son reports that he will tell her that we can plan to meet after lunch.  Symptom Management:  Pain: Patient does not appear to show any signs of acute pain today. Would recommend continue same regimen.  Somnolence: I fear this is an irreversible process that may be her transitioning to  early stages of actively dying. I shared with the family that was present again my concerns that this is beginning of dying process for the patient. Family expressed understanding my concern but remain hopeful that she will recover.  Rash: Patient received her first dose of Levaquin this afternoon. While the distribution on her neck almost appears as though it may be a topical reaction to cream/ointment that was applied, the family reports that no new product have been used on her in the last week. The area feels slightly greasy, but this may be due to cream that was applied over top of the area of erythema rather than being the cause of erythema. She also has area of  erythema at the site of her IV infusion. Because of this, would have to suspect that this is possibly a hypersensitivity reaction secondary to Levaquin. I called and spoke with the pharmacist on call who confirmed that this is a documented reaction in approximately 1% of patients. The patient is not due for another dose of antibiotics for 48 hours from initial due to her poor creatinine clearance. I asked the bedside nurse to keep close eye on patient overnight for worsening of rash or other signs of potential allergic reaction. She shows no signs of airway compromise or anaphylaxis. I discontinued the antibiotics and will touch base with the rest of her care team tomorrow to ensure that they note that I stopped them. Finally, I touch based with the on-call overnight coverage make sure she was aware of situation as well.   Psycho-social/Spiritual:  Desire for further Chaplaincy support:no   Prognosis: < 6 months Discharge Planning: SNF with hospice versus inpatient hospice versus home with hospice. Care management consult placed for Hospice discussions.   Care plan was discussed with Patient's daughter, her son-in-law, her granddaughter and multiple other family members.  Thank you for allowing the Palliative Medicine Team to assist in  the care of this patient.   Time In: 1900 Time Out: 2000 Total Time 60 Prolonged Time Billed no    Greater than 50%  of this time was spent counseling and coordinating care related to the above assessment and plan.   Micheline Rough, MD  04/19/2015, 8:33 PM  Please contact Palliative Medicine Team phone at [phone] for questions and concerns.

## 2015-04-19 NOTE — Progress Notes (Signed)
Patient Demographics   Tanya Farmer, is a 81 y.o. female, DOB - 08/07/1926, JME:[id number] Admit date - 04/12/2015   Admitting Physician Rise Patience, MD Outpatient Primary MD for the patient is Blanchie Serve, MD LOS - 7    Assessment  & Plan :    1. Ischemic right foot- seen by vascular surgery, plan for conservative approach. Heparin IV while not able to take her oral anticoagulation medication. Vicodin discontinued. As such will scaled back pain medication to acetaminophen 2. Atrial fibrillation- heart rate is controlled, continue anticoagulation with IV heparin while unable to swallow medication due to hypersomnolence. 3. Hypertension- Well controlled off antihypertensive regimen. 4. Thrombocytosis- continue hydroxyurea once patient able to tolerate oral regimen. 5. H/o dementia- stable 6. Goals of care- palliative care following 7. Altered mental status: Ammonia level and TSH elevated. IV synthroid started.  Lactulose administered  04/17/15. Patient also spiked fever as such ordered blood cultures, U/A, and portable chest x ray. U/A and chest x ray negative for source of fever. Started on Levaquin at this juncture.  Code Status: DNR Family Communication: discussed with daughter Disposition Plan: hospice vs SNF   Consultants: Palliative care Vascular surgery  Procedures: None  Antibiotics: None     Subjective:/ HPI  81 y.o. female with history of atrial fibrillation on the Apixaban, chronic kidney disease stage III, hypertension, hypothyroidism and advanced dementia was brought to the ER after patient was found to have darkening of her right foot fifth toe. As per the family patient had not been doing well last few days and has been noticed to have moving her right lower extremity more than normal. Patient's family initially noticed some redness of the toe. Patient is not very communicative because of her dementia. Patient  was brought to the ER with concerns of acute ischemic limb and vascular surgeon Dr. Oneida Alar has seen and evaluated the patient and at this time feels patient is best treated conservatively given her comorbidities.   Still hypersomnolent.   Objective:    Filed Vitals:   04/19/15 0930  BP: 135/85  Pulse: 89  Temp:   Resp:     Intake/Output Summary (Last 24 hours) at 04/19/15 1705 Last data filed at 04/18/15 2248  Gross per 24 hour  Intake    334 ml  Output      1 ml  Net    333 ml   Filed Weights   04/16/15 2133 04/17/15 2021 04/18/15 1945  Weight: 57 kg (125 lb 10.6 oz) 55 kg (121 lb 4.1 oz) 55.2 kg (121 lb 11.1 oz)      Exam:   General:  Still hypersomnolent, in nad  Cardiovascular: S1S2 RRR  Respiratory: Clear bilaterally, no increased wob  Abdomen: Soft, non-tender, no organomegaly  Musculoskeletal: black right fifth toe, foot tender to palpation      Data Review:   Micro Results Recent Results (from the past 240 hour(s))  MRSA PCR Screening     Status: None   Collection Time: 04/13/15  3:50 AM  Result Value Ref Range Status   MRSA by PCR NEGATIVE NEGATIVE Final    Comment:        The GeneXpert MRSA Assay (FDA approved for NASAL specimens only), is  one component of a comprehensive MRSA colonization surveillance program. It is not intended to diagnose MRSA infection nor to guide or monitor treatment for MRSA infections.     Radiology Reports Dg Chest Port 1 View  04/19/2015   CLINICAL DATA:  Patient with right foot ischemia.  EXAM: PORTABLE CHEST - 1 VIEW  COMPARISON:  Chest radiograph 09/08/2014  FINDINGS: Stable enlarged cardiac and mediastinal contours. No large pulmonary consolidation. Stable coarse interstitial opacities bilaterally. No pleural effusion or pneumothorax.  IMPRESSION: Cardiomegaly.  No acute cardiopulmonary process.   Electronically Signed   By: Lovey Newcomer M.D.   On: 04/19/2015 13:43   Dg Foot Complete Right  04/12/2015    CLINICAL DATA:  Discoloration of the fourth and fifth toes, no known injury, initial encounter  EXAM: RIGHT FOOT COMPLETE - 3+ VIEW  COMPARISON:  None.  FINDINGS: There is no evidence of fracture or dislocation. There is no evidence of arthropathy or other focal bone abnormality. Soft tissues are unremarkable.  IMPRESSION: No acute abnormality noted.   Electronically Signed   By: Inez Catalina M.D.   On: 04/12/2015 20:28     CBC  Recent Labs Lab 04/12/15 1936 04/13/15 0427 04/16/15 1852 04/18/15 0422 04/19/15 0553  WBC 6.1 5.7 5.4 6.6 7.0  HGB 12.9 11.3* 11.7* 11.3* 11.4*  HCT 39.4 35.6* 35.6* 34.1* 34.6*  PLT 899* 879* 950* 829* 804*  MCV 94.9 95.2 93.4 92.9 91.8  MCH 31.1 30.2 30.7 30.8 30.2  MCHC 32.7 31.7 32.9 33.1 32.9  RDW 19.3* 19.3* 18.6* 18.6* 18.7*  LYMPHSABS 1.1 1.0  --   --   --   MONOABS 0.4 0.3  --   --   --   EOSABS 0.1 0.1  --   --   --   BASOSABS 0.1 0.1  --   --   --     Chemistries   Recent Labs Lab 04/12/15 1936 04/13/15 0427 04/14/15 0513 04/16/15 1852  NA 139 140 138 136  K 3.2* 3.2* 4.1 4.1  CL 105 103 106 106  CO2 25 25 25  21*  GLUCOSE 107* 95 100* 150*  BUN 7 8 8  <5*  CREATININE 0.69 0.72 0.70 0.67  CALCIUM 8.9 8.8* 8.6* 8.6*  AST  --  27  --  27  ALT  --  17  --  18  ALKPHOS  --  53  --  55  BILITOT  --  0.6  --  0.5   ------------------------------------------------------------------------------------------------------------------ estimated creatinine clearance is 41 mL/min (by C-G formula based on Cr of 0.67). ------------------------------------------------------------------------------------------------------------------    Time Spent in minutes   25 min   Velvet Bathe M.D on 04/19/2015 at 5:05 PM  Between 7am to 7pm - Pager - [phone] After 7pm go to [email or web address] - password [facility]  [facility] -  Office  [phone]

## 2015-04-20 LAB — CBC
HCT: 31.7 % — ABNORMAL LOW (ref 36.0–46.0)
Hemoglobin: 10.3 g/dL — ABNORMAL LOW (ref 12.0–15.0)
MCH: 29.3 pg (ref 26.0–34.0)
MCHC: 32.5 g/dL (ref 30.0–36.0)
MCV: 90.1 fL (ref 78.0–100.0)
PLATELETS: 627 10*3/uL — AB (ref 150–400)
RBC: 3.52 MIL/uL — AB (ref 3.87–5.11)
RDW: 18.8 % — ABNORMAL HIGH (ref 11.5–15.5)
WBC: 7 10*3/uL (ref 4.0–10.5)

## 2015-04-20 LAB — GLUCOSE, CAPILLARY
GLUCOSE-CAPILLARY: 95 mg/dL (ref 65–99)
Glucose-Capillary: 110 mg/dL — ABNORMAL HIGH (ref 65–99)

## 2015-04-20 LAB — HEPARIN LEVEL (UNFRACTIONATED)
Heparin Unfractionated: 0.28 IU/mL — ABNORMAL LOW (ref 0.30–0.70)
Heparin Unfractionated: 0.56 IU/mL (ref 0.30–0.70)

## 2015-04-20 MED ORDER — PIPERACILLIN-TAZOBACTAM 3.375 G IVPB
3.3750 g | Freq: Three times a day (TID) | INTRAVENOUS | Status: DC
  Administered 2015-04-20 – 2015-04-22 (×6): 3.375 g via INTRAVENOUS
  Filled 2015-04-20 (×9): qty 50

## 2015-04-20 NOTE — Progress Notes (Signed)
ANTICOAGULATION CONSULT NOTE - Follow Up Consult  Pharmacy Consult for Heparin Indication: atrial fibrillation & ischemic R-foot  No Known Allergies  Patient Measurements: Height: 5\' 3"  (160 cm) Weight: 121 lb 11.1 oz (55.2 kg) IBW/kg (Calculated) : 52.4 Heparin Dosing Weight: 55 kg  Vital Signs: Temp: 97.5 F (36.4 C) (08/22 1014) Temp Source: Axillary (08/22 1014) BP: 100/68 mmHg (08/22 1014) Pulse Rate: 91 (08/22 1014)  Labs:  Recent Labs  04/18/15 0422 04/19/15 0553 04/20/15 0442  HGB 11.3* 11.4* 10.3*  HCT 34.1* 34.6* 31.7*  PLT 829* 804* 627*  HEPARINUNFRC 0.36 0.35 0.28*    Estimated Creatinine Clearance: 41 mL/min (by C-G formula based on Cr of 0.67).   Medications:  Heparin @ 850 units/hr (8.5 ml/hr)  Assessment: 89 YOF on Apixaban PTA for hx Afib and ischemic R-foot - now transitioned to IV Heparin given poor oral intake while awaiting palliative plans. The patient's IV site infiltrated during the night and the heparin drip was off for several hours leading to a falsely low heparin level drawn with AM labs (HL 0.28, goal of 0.3-0.7). The heparin was restarted around 0900 this AM - will continue at the old rate and follow-up with another heparin level this afternoon. Hgb/Hct/Plt slight drop - no overt s/sx of bleeding noted.   Goal of Therapy:  Heparin level 0.3-0.7 units/ml Monitor platelets by anticoagulation protocol: Yes   Plan:  1. Continue heparin at 850 units/hr (8.5 ml/hr) 2. Will continue to monitor for any signs/symptoms of bleeding and will follow up with heparin level in 8 hours   Alycia Rossetti, PharmD, BCPS Clinical Pharmacist Pager: [phone] 04/20/2015 2:02 PM

## 2015-04-20 NOTE — Progress Notes (Signed)
Upon assessment this am pt was in bed with eyes open. Attempted to feed patient at this time and she was able to eat. However this period of alertness lasted less than five minutes. Per pt's family, pt had two other short periods of increased alertness and pt was able to eat some. Will continue to monitor.

## 2015-04-20 NOTE — Progress Notes (Addendum)
Late entry.   Pt continues to be too lethargic to take PO medications or to eat food. Pt's level of alertness was assessed multiple times throughout the day by this RN with no change.   Tyna Jaksch, RN

## 2015-04-20 NOTE — Progress Notes (Signed)
Daily Progress Note   Patient Name: Tanya Farmer       Date: 04/20/2015 DOB: Oct 27, 1925  Age: 81 y.o. MRN#: [id number] Attending Physician: Velvet Bathe, MD Primary Care Physician: Blanchie Serve, MD Admit Date: 04/12/2015  Reason for Consultation/Follow-up: Establishing goals of care  Subjective: 81 y.o. year old female who presents for evaluation of right foot ischemia. Pt noted to have black right 5th toe by daughter at her nursing facility [facility] and was brought to ER. Pt has severe dementia and lives in SNF. She is essentially total assist and has to be fed. Daughter stated that she had gone down hill over the last week. Pt is non verbal at baseline.  Pt is on Eliquis for chronic afib and daughter stated she has not been refusing meds. She also has thrombocytosis and has had right iliofemoral thrombectomy and left fempop by Dr Donnetta Hutching in 2002. Other medical problems include hypertension hyperlipidemia, parkinson's, multiple chronic UTIs.  Interval Events:  Met with Ms. Laine as well as her daughter and son-in-law.  She was started on antibiotics yesterday following a fever of unknown origin. It appears that she has gram-negative rod bacteremia per initial culture results. She is more awake today, but overall remains very sleepy.  With this new change in her condition, her daughter is invested in continuing course of antibiotics and seeing how she does clinically.  Length of Stay: 8 days  Current Medications: Scheduled Meds:  . acetaminophen  650 mg Oral TID  . antiseptic oral rinse  7 mL Mouth Rinse BID  . atorvastatin  10 mg Oral q1800  . calcium-vitamin D  1 tablet Oral BID  . ferrous sulfate  325 mg Oral BID WC  . hydroxyurea  1,000 mg Oral Q breakfast  . levothyroxine  100 mcg Intravenous Daily  . multivitamin with minerals  5 mL Oral Daily  . pantoprazole (PROTONIX) IV  40 mg Intravenous Q24H  . piperacillin-tazobactam (ZOSYN)  IV  3.375 g Intravenous 3 times  per day  . senna-docusate  1 tablet Oral QPM  . sodium chloride  1 spray Each Nare Daily    Continuous Infusions: . dextrose 5 % and 0.45 % NaCl with KCl 20 mEq/L 75 mL/hr at 04/20/15 0335  . heparin 850 Units/hr (04/20/15 0930)    PRN Meds: guaiFENesin, labetalol, methocarbamol, ondansetron **OR** ondansetron (ZOFRAN) IV  Palliative Performance Scale: 20%     Vital Signs: BP 100/68 mmHg  Pulse 91  Temp(Src) 97.5 F (36.4 C) (Axillary)  Resp 18  Ht '5\' 3"'  (1.6 m)  Wt 55.2 kg (121 lb 11.1 oz)  BMI 21.56 kg/m2  SpO2 100% SpO2: SpO2: 100 % O2 Device: O2 Device: Not Delivered O2 Flow Rate:    Intake/output summary:   Intake/Output Summary (Last 24 hours) at 04/20/15 1525 Last data filed at 04/20/15 1430  Gross per 24 hour  Intake      0 ml  Output      0 ml  Net      0 ml   LBM:   Baseline Weight: Weight: 51 kg (112 lb 7 oz) Most recent weight: Weight: 55.2 kg (121 lb 11.1 oz)  Physical Exam:  Gen.: Weak frail elderly AA female, did open eyes when I was examining her, but no purposeful interaction  NAD Lungs: Clear anteriorly, shallow breaths. No wheezing. CV: Intermittently irregular, S1 S2 Abdomen: soft Ext: Gangrene R foot digits     Skin:  Still with small amount of  erythema noted on the front portion of her neck that extends from the jaw line bilaterally to approximately 2 cm below the sternal notch. Additional Data Reviewed: Recent Labs     04/19/15  0553  04/20/15  0442  WBC  7.0  7.0  HGB  11.4*  10.3*  PLT  804*  627*     Problem List:  Patient Active Problem List   Diagnosis Date Noted  . Rash and nonspecific skin eruption   . Goals of care, counseling/discussion   . Encounter for palliative care   . 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 Ischemia of foot 04/12/2015  . Advanced dementia 04/01/2015  . Palliative care encounter 09/08/2014  . Decreased appetite 09/08/2014  . Thrombocythemia 09/03/2014  . Acute lower GI bleeding 08/31/2014  . GI bleed  08/31/2014  . Cerumen impaction 07/14/2014  . Paroxysmal atrial fibrillation 06/26/2014  . 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 Dementia with Parkinsonism 06/26/2014  . Recurrent UTI 05/19/2014  . Insomnia 12/18/2013  . Constipation 12/18/2013  . Dementia without behavioral disturbance 09/23/2013  . GERD (gastroesophageal reflux disease) 09/23/2013  . Dyslipidemia 09/23/2013  . Weakness generalized 09/23/2013  . UTI (lower urinary tract infection) 09/16/2013  . FTT (failure to thrive) in adult 09/16/2013  . Facial erythema 09/16/2013  . Chronic anticoagulation 09/16/2013  . Essential hypertension, benign 07/06/2013  . Abnormal EKG 07/05/2013  . Essential thrombocytosis 07/05/2013  . Hypothyroidism 07/05/2013  . Peripheral vascular disease, unspecified 05/08/2012     Palliative Care Assessment & Plan    Code Status:  DNR  Goals of Care: The patient appears to be improved clinically today and is more awake. Her daughter is fully invested in continuing forward with her care including continued antibiotics. We will continue to review the chart and see how she does clinically. Please let us know if we can be of further assistance as we would happy reengage in care this patient if we can be of further service. I did try to impress to the daughter that while we are celebrating that her mother is improving, she is very frail, still has her chronic medical problems that will continue along a progressive course, and is still approaching the end of her life.  Symptom Management:  Pain: Patient does not appear to show any signs of acute pain today. Would recommend continue same regimen. Somnolence: Improving.  Psycho-social/Spiritual:  Desire for further Chaplaincy support:no   Prognosis: < 6 months Discharge Planning: Unclear. She is clinically improving today.   Care plan was discussed with Patient's daughter and her son-in-law.  Thank you for  allowing the Palliative Medicine Team to assist in the care of this patient.   Time In: 1300 Time Out: 1330 Total Time 30 Prolonged Time Billed no    Greater than 50%  of this time was spent counseling and coordinating care related to the above assessment and plan.   Micheline Rough, MD  04/20/2015, 3:25 PM  Please contact Palliative Medicine Team phone at [phone] for questions and concerns.

## 2015-04-20 NOTE — Care Management Important Message (Signed)
Important Message  Patient Details  Name: Tanya Farmer MRN: [id number] Date of Birth: 10-17-1925   Medicare Important Message Given:  Yes-third notification given    Erenest Rasher, RN 04/20/2015, 3:16 PM

## 2015-04-20 NOTE — Progress Notes (Signed)
Utilization review complete. Nevan Creighton RN CCM Case Mgmt phone [phone] 

## 2015-04-20 NOTE — Progress Notes (Signed)
Pt IV in left  Hand grimace with  pain to touch with redness around the site and swelling shows level three phlebitis,Laura Harduk PAC notified heat Pack applied will continue to monitor.

## 2015-04-20 NOTE — Progress Notes (Signed)
ANTICOAGULATION CONSULT NOTE - FOLLOW UP    HL = 0.56 (goal 0.3 - 0.7 units/mL) Heparin dosing weight = 55 kg   Assessment: 89 YOF with history of Afib and ischemic foot to continue on IV heparin while unable to take PO meds.  Although heparin level was drawn an hour early after it was resumed, expect true level to remain therapeutic.  No bleeding reported.   Plan: - Continue heparin gtt at 850 units/hr - F/U AM labs    Akaash Vandewater D. Mina Marble, PharmD, BCPS 04/20/2015, 5:46 PM

## 2015-04-20 NOTE — Progress Notes (Signed)
Patient Demographics   Tanya Farmer, is a 81 y.o. female, DOB - 19-Aug-1926, YBW:[id number] Admit date - 04/12/2015   Admitting Physician Rise Patience, MD Outpatient Primary MD for the patient is Blanchie Serve, MD LOS - 8    Assessment  & Plan :    1. Ischemic right foot- seen by vascular surgery, plan for conservative approach. Heparin IV while not able to take her oral anticoagulation medication. Vicodin discontinued. As such will scaled back pain medication to acetaminophen 2. Bacteremia: Continue Zosyn. Improvement in mentation on this medication. ? source 3. Atrial fibrillation- heart rate is controlled, continue anticoagulation with IV heparin while unable to swallow medication due to hypersomnolence. 4. Hypertension- Well controlled off antihypertensive regimen. 5. Thrombocytosis- continue hydroxyurea once patient able to tolerate oral regimen. 6. H/o dementia- stable 7. Goals of care- palliative care following 8. Altered mental status: Ammonia level and TSH elevated. IV synthroid started.  Lactulose administered  04/17/15. Could be secondary to infectious etiology given new diagnosis of bacteremia. With improvement in mentation may try to advance diet.  Code Status: DNR Family Communication: discussed with daughter Disposition Plan: pending improvement in condition.   Consultants: Palliative care Vascular surgery  Procedures: None  Antibiotics: zosyn     Subjective:/ HPI  81 y.o. female with history of atrial fibrillation on the Apixaban, chronic kidney disease stage III, hypertension, hypothyroidism and advanced dementia was brought to the ER after patient was found to have darkening of her right foot fifth toe. As per the family patient had not been doing well last few days and has been noticed to have moving her right lower extremity more than normal. Patient's family initially noticed some redness of the toe. Patient  is not very communicative because of her dementia. Patient was brought to the ER with concerns of acute ischemic limb and vascular surgeon Dr. Oneida Alar has seen and evaluated the patient and at this time feels patient is best treated conservatively given her comorbidities.   Still hypersomnolent but improvement from yesterday   Objective:    Filed Vitals:   04/20/15 1614  BP: 103/61  Pulse: 92  Temp: 98.8 F (37.1 C)  Resp: 18    Intake/Output Summary (Last 24 hours) at 04/20/15 1636 Last data filed at 04/20/15 1430  Gross per 24 hour  Intake      0 ml  Output      0 ml  Net      0 ml   Filed Weights   04/16/15 2133 04/17/15 2021 04/18/15 1945  Weight: 57 kg (125 lb 10.6 oz) 55 kg (121 lb 4.1 oz) 55.2 kg (121 lb 11.1 oz)      Exam:   General:  Still hypersomnolent, in nad  Cardiovascular: S1S2 RRR  Respiratory: Clear bilaterally, no increased wob  Abdomen: Soft, non-tender, no organomegaly  Musculoskeletal: black right fifth toe, foot tender to palpation      Data Review:   Micro Results Recent Results (from the past 240 hour(s))  MRSA PCR Screening     Status: None   Collection Time: 04/13/15  3:50 AM  Result Value Ref Range Status   MRSA by PCR NEGATIVE NEGATIVE Final    Comment:  The GeneXpert MRSA Assay (FDA approved for NASAL specimens only), is one component of a comprehensive MRSA colonization surveillance program. It is not intended to diagnose MRSA infection nor to guide or monitor treatment for MRSA infections.   Culture, blood (routine x 2)     Status: None (Preliminary result)   Collection Time: 04/19/15  1:00 PM  Result Value Ref Range Status   Specimen Description BLOOD BLOOD RIGHT ARM  Final   Special Requests BOTTLES DRAWN AEROBIC AND ANAEROBIC 5CC  Final   Culture  Setup Time   Final    GRAM NEGATIVE RODS IN BOTH AEROBIC AND ANAEROBIC BOTTLES CRITICAL RESULT CALLED TO, READ BACK BY AND VERIFIED WITH: TO KGENGLER(RN) BY  TCLEVELAND 02/18/2015 AT 2:54AM    Culture NO GROWTH 1 DAY  Final   Report Status PENDING  Incomplete  Culture, blood (routine x 2)     Status: None (Preliminary result)   Collection Time: 04/19/15  1:00 PM  Result Value Ref Range Status   Specimen Description BLOOD BLOOD RIGHT ARM  Final   Special Requests BOTTLES DRAWN AEROBIC AND ANAEROBIC 5CC  Final   Culture  Setup Time   Final    GRAM NEGATIVE RODS IN BOTH AEROBIC AND ANAEROBIC BOTTLES CRITICAL RESULT CALLED TO, READ BACK BY AND VERIFIED WITH: TO KGENGLER(RN) BY TCLEVELAND 04/20/2015 AT 2:56AM    Culture NO GROWTH 1 DAY  Final   Report Status PENDING  Incomplete    Radiology Reports Dg Chest Port 1 View  04/19/2015   CLINICAL DATA:  Patient with right foot ischemia.  EXAM: PORTABLE CHEST - 1 VIEW  COMPARISON:  Chest radiograph 09/08/2014  FINDINGS: Stable enlarged cardiac and mediastinal contours. No large pulmonary consolidation. Stable coarse interstitial opacities bilaterally. No pleural effusion or pneumothorax.  IMPRESSION: Cardiomegaly.  No acute cardiopulmonary process.   Electronically Signed   By: Lovey Newcomer M.D.   On: 04/19/2015 13:43   Dg Foot Complete Right  04/12/2015   CLINICAL DATA:  Discoloration of the fourth and fifth toes, no known injury, initial encounter  EXAM: RIGHT FOOT COMPLETE - 3+ VIEW  COMPARISON:  None.  FINDINGS: There is no evidence of fracture or dislocation. There is no evidence of arthropathy or other focal bone abnormality. Soft tissues are unremarkable.  IMPRESSION: No acute abnormality noted.   Electronically Signed   By: Inez Catalina M.D.   On: 04/12/2015 20:28     CBC  Recent Labs Lab 04/16/15 1852 04/18/15 0422 04/19/15 0553 04/20/15 0442  WBC 5.4 6.6 7.0 7.0  HGB 11.7* 11.3* 11.4* 10.3*  HCT 35.6* 34.1* 34.6* 31.7*  PLT 950* 829* 804* 627*  MCV 93.4 92.9 91.8 90.1  MCH 30.7 30.8 30.2 29.3  MCHC 32.9 33.1 32.9 32.5  RDW 18.6* 18.6* 18.7* 18.8*    Chemistries   Recent Labs Lab  04/14/15 0513 04/16/15 1852  NA 138 136  K 4.1 4.1  CL 106 106  CO2 25 21*  GLUCOSE 100* 150*  BUN 8 <5*  CREATININE 0.70 0.67  CALCIUM 8.6* 8.6*  AST  --  27  ALT  --  18  ALKPHOS  --  55  BILITOT  --  0.5   ------------------------------------------------------------------------------------------------------------------ estimated creatinine clearance is 41 mL/min (by C-G formula based on Cr of 0.67). ------------------------------------------------------------------------------------------------------------------    Time Spent in minutes   25 min   Velvet Bathe M.D on 04/20/2015 at 4:36 PM  Between 7am to 7pm - Pager - [phone] After 7pm go  to [email or web address] - password [facility]  [facility] -  Office  [phone]

## 2015-04-21 LAB — CBC
HEMATOCRIT: 30.4 % — AB (ref 36.0–46.0)
Hemoglobin: 10.1 g/dL — ABNORMAL LOW (ref 12.0–15.0)
MCH: 30 pg (ref 26.0–34.0)
MCHC: 33.2 g/dL (ref 30.0–36.0)
MCV: 90.2 fL (ref 78.0–100.0)
Platelets: 773 10*3/uL — ABNORMAL HIGH (ref 150–400)
RBC: 3.37 MIL/uL — ABNORMAL LOW (ref 3.87–5.11)
RDW: 18.8 % — AB (ref 11.5–15.5)
WBC: 5.8 10*3/uL (ref 4.0–10.5)

## 2015-04-21 LAB — GLUCOSE, CAPILLARY
GLUCOSE-CAPILLARY: 122 mg/dL — AB (ref 65–99)
GLUCOSE-CAPILLARY: 129 mg/dL — AB (ref 65–99)
GLUCOSE-CAPILLARY: 127 mg/dL — AB (ref 65–99)
Glucose-Capillary: 117 mg/dL — ABNORMAL HIGH (ref 65–99)

## 2015-04-21 LAB — HEPARIN LEVEL (UNFRACTIONATED): HEPARIN UNFRACTIONATED: 0.36 [IU]/mL (ref 0.30–0.70)

## 2015-04-21 MED ORDER — CERTA-VITE PO LIQD
5.0000 mL | Freq: Every day | ORAL | Status: DC
  Filled 2015-04-21 (×2): qty 5

## 2015-04-21 MED ORDER — ADULT MULTIVITAMIN LIQUID CH
5.0000 mL | Freq: Every day | ORAL | Status: DC
  Administered 2015-04-22 – 2015-04-28 (×4): 5 mL via ORAL
  Filled 2015-04-21 (×11): qty 5

## 2015-04-21 NOTE — Progress Notes (Signed)
Brief Narrative: Patient is an 81 year old with history of atrial fibrillation, history of dementia, hypertension, who presented with ischemic right foot and altered mental status. After further workup patient was recently found to have bacteremia. Currently on IV Zosyn. Evaluated by vascular surgery who recommended conservative approach. Hospital stay has been complicated by hypersomnolence which is currently improving with IV antibiotics.                                               Patient Demographics   Tanya Farmer, is a 81 y.o. female, DOB - May 10, 1926, TWS:[id number] Admit date - 04/12/2015   Admitting Physician Rise Patience, MD Outpatient Primary MD for the patient is Blanchie Serve, MD LOS - 9    Assessment  & Plan :    1. Ischemic right foot- seen by vascular surgery, plan for conservative approach. Heparin IV while not able to take her oral anticoagulation medication. Vicodin discontinued. As such will scaled back pain medication to acetaminophen 2. Bacteremia: Continue Zosyn. Improvement in mentation on this medication. ? source 3. Atrial fibrillation- heart rate is controlled, continue anticoagulation with IV heparin while unable to swallow medication due to hypersomnolence. 4. Hypertension- Well controlled off antihypertensive regimen. 5. Thrombocytosis- continue hydroxyurea once patient able to tolerate oral regimen. 6. H/o dementia- stable 7. Goals of care- palliative care following 8. Altered mental status: Ammonia level and TSH elevated. IV synthroid started.  Lactulose administered  04/17/15. Could be secondary to infectious etiology given new diagnosis of bacteremia. With improvement in mentation may try to advance diet.  Code Status: DNR Family Communication: discussed with daughter Disposition Plan: Once sensitivities in for blood culture may transition to a different antibiotics, pending improvement in somnolence to the point where patient is taking oral  intake   Consultants: Palliative care Vascular surgery  Procedures: None  Antibiotics: zosyn     Subjective:/ HPI  81 y.o. female with history of atrial fibrillation on the Apixaban, chronic kidney disease stage III, hypertension, hypothyroidism and advanced dementia was brought to the ER after patient was found to have darkening of her right foot fifth toe. As per the family patient had not been doing well last few days and has been noticed to have moving her right lower extremity more than normal. Patient's family initially noticed some redness of the toe. Patient is not very communicative because of her dementia. Patient was brought to the ER with concerns of acute ischemic limb and vascular surgeon Dr. Oneida Alar has seen and evaluated the patient and at this time feels patient is best treated conservatively given her comorbidities.   Still hypersomnolent but but has been improving since started on IV antibiotics.   Objective:    Filed Vitals:   04/21/15 0523  BP: 167/94  Pulse: 100  Temp: 100.1 F (37.8 C)  Resp: 18    Intake/Output Summary (Last 24 hours) at 04/21/15 1636 Last data filed at 04/21/15 1400  Gross per 24 hour  Intake   4600 ml  Output      0 ml  Net   4600 ml   Filed Weights   04/17/15 2021 04/18/15 1945 04/20/15 2056  Weight: 55 kg (121 lb 4.1 oz) 55.2 kg (121 lb 11.1 oz) 55.3 kg (121 lb 14.6 oz)      Exam:   General:  Will open eyes spontaneously and look in  our general direction, in nad  Cardiovascular: S1S2 RRR  Respiratory: Clear bilaterally, no increased wob  Abdomen: Soft, non-tender, no organomegaly  Musculoskeletal: black right fifth toe, foot tender to palpation      Data Review:   Micro Results Recent Results (from the past 240 hour(s))  MRSA PCR Screening     Status: None   Collection Time: 04/13/15  3:50 AM  Result Value Ref Range Status   MRSA by PCR NEGATIVE NEGATIVE Final    Comment:        The GeneXpert MRSA  Assay (FDA approved for NASAL specimens only), is one component of a comprehensive MRSA colonization surveillance program. It is not intended to diagnose MRSA infection nor to guide or monitor treatment for MRSA infections.   Culture, blood (routine x 2)     Status: None (Preliminary result)   Collection Time: 04/19/15  1:00 PM  Result Value Ref Range Status   Specimen Description BLOOD BLOOD RIGHT ARM  Final   Special Requests BOTTLES DRAWN AEROBIC AND ANAEROBIC 5CC  Final   Culture  Setup Time   Final    GRAM NEGATIVE RODS IN BOTH AEROBIC AND ANAEROBIC BOTTLES CRITICAL RESULT CALLED TO, READ BACK BY AND VERIFIED WITH: TO KGENGLER(RN) BY TCLEVELAND 02/18/2015 AT 2:54AM    Culture GRAM NEGATIVE RODS  Final   Report Status PENDING  Incomplete  Culture, blood (routine x 2)     Status: None (Preliminary result)   Collection Time: 04/19/15  1:00 PM  Result Value Ref Range Status   Specimen Description BLOOD BLOOD RIGHT ARM  Final   Special Requests BOTTLES DRAWN AEROBIC AND ANAEROBIC 5CC  Final   Culture  Setup Time   Final    GRAM NEGATIVE RODS IN BOTH AEROBIC AND ANAEROBIC BOTTLES CRITICAL RESULT CALLED TO, READ BACK BY AND VERIFIED WITH: TO KGENGLER(RN) BY TCLEVELAND 04/20/2015 AT 2:56AM    Culture GRAM NEGATIVE RODS  Final   Report Status PENDING  Incomplete    Radiology Reports Dg Chest Port 1 View  04/19/2015   CLINICAL DATA:  Patient with right foot ischemia.  EXAM: PORTABLE CHEST - 1 VIEW  COMPARISON:  Chest radiograph 09/08/2014  FINDINGS: Stable enlarged cardiac and mediastinal contours. No large pulmonary consolidation. Stable coarse interstitial opacities bilaterally. No pleural effusion or pneumothorax.  IMPRESSION: Cardiomegaly.  No acute cardiopulmonary process.   Electronically Signed   By: Lovey Newcomer M.D.   On: 04/19/2015 13:43   Dg Foot Complete Right  04/12/2015   CLINICAL DATA:  Discoloration of the fourth and fifth toes, no known injury, initial encounter  EXAM:  RIGHT FOOT COMPLETE - 3+ VIEW  COMPARISON:  None.  FINDINGS: There is no evidence of fracture or dislocation. There is no evidence of arthropathy or other focal bone abnormality. Soft tissues are unremarkable.  IMPRESSION: No acute abnormality noted.   Electronically Signed   By: Inez Catalina M.D.   On: 04/12/2015 20:28     CBC  Recent Labs Lab 04/16/15 1852 04/18/15 0422 04/19/15 0553 04/20/15 0442 04/21/15 0639  WBC 5.4 6.6 7.0 7.0 5.8  HGB 11.7* 11.3* 11.4* 10.3* 10.1*  HCT 35.6* 34.1* 34.6* 31.7* 30.4*  PLT 950* 829* 804* 627* 773*  MCV 93.4 92.9 91.8 90.1 90.2  MCH 30.7 30.8 30.2 29.3 30.0  MCHC 32.9 33.1 32.9 32.5 33.2  RDW 18.6* 18.6* 18.7* 18.8* 18.8*    Chemistries   Recent Labs Lab 04/16/15 1852  NA 136  K 4.1  CL  106  CO2 21*  GLUCOSE 150*  BUN <5*  CREATININE 0.67  CALCIUM 8.6*  AST 27  ALT 18  ALKPHOS 55  BILITOT 0.5   ------------------------------------------------------------------------------------------------------------------ estimated creatinine clearance is 41 mL/min (by C-G formula based on Cr of 0.67). ------------------------------------------------------------------------------------------------------------------    Time Spent in minutes   25 min   Velvet Bathe M.D on 04/21/2015 at 4:36 PM  Between 7am to 7pm - Pager - [phone] After 7pm go to [email or web address] - password [facility]  [facility] -  Office  [phone]

## 2015-04-21 NOTE — Progress Notes (Signed)
ANTICOAGULATION CONSULT NOTE - Follow Up Consult  Pharmacy Consult for Heparin Indication: atrial fibrillation & ischemic R-foot  No Known Allergies  Patient Measurements: Height: 5\' 3"  (160 cm) Weight: 121 lb 14.6 oz (55.3 kg) IBW/kg (Calculated) : 52.4 Heparin Dosing Weight: 55 kg  Vital Signs: Temp: 100.1 F (37.8 C) (08/23 0523) Temp Source: Axillary (08/23 0523) BP: 167/94 mmHg (08/23 0523) Pulse Rate: 100 (08/23 0523)  Labs:  Recent Labs  04/19/15 0553 04/20/15 0442 04/20/15 1654 04/21/15 0639  HGB 11.4* 10.3*  --  10.1*  HCT 34.6* 31.7*  --  30.4*  PLT 804* 627*  --  773*  HEPARINUNFRC 0.35 0.28* 0.56 0.36    Estimated Creatinine Clearance: 41 mL/min (by C-G formula based on Cr of 0.67).   Medications:  Heparin @ 850 units/hr (8.5 ml/hr)  Assessment: 89 YOF on Apixaban PTA for hx Afib and ischemic R-foot - now transitioned to IV Heparin given poor oral intake while awaiting palliative plans. The patient's IV site infiltrated prior to AM labs on 8/22 - it has since been resumed and is back in the therapeutic range (HL 0.36 << 0.56, goal of 0.3-0.7). CBC stable - no overt signs/symptoms of bleeding noted at this time.   Goal of Therapy:  Heparin level 0.3-0.7 units/ml Monitor platelets by anticoagulation protocol: Yes   Plan:  1. Continue heparin at 850 units/hr (8.5 ml/hr) 2. Will continue to monitor for any signs/symptoms of bleeding and will follow up with heparin level in 8 hours   Alycia Rossetti, PharmD, BCPS Clinical Pharmacist Pager: [phone] 04/21/2015 11:09 AM

## 2015-04-21 NOTE — Clinical Social Work Note (Signed)
CSW talked at length with patient's daughter, Arimental Mosely (sitting near nurses station) regarding patient. Daughter updated CSW on how patient was progressing and what the doctor's have been communicating with her. Also talked about discharge planning and return to [facility]. [name] expressed some concerns about her mother's care at [facility], however she understands that she would probably have concerns at any facility. Daughter aware that patient is LTC at [facility] and [place] is the payor. Talked with Mrs. Ernestine Conrad about about the possibility of patient returning to facility for rehab and using her Medicare, as it appears patient may have 100 Medicare rehab days again. Daughter gave CSW permission to contact facility and confirm this. CSW informed by daughter that patient has been in and out of the hospital from [facility] last year, however per daughter, patient has not been in the hospital from Jan. '16 to this hospitalization. CSW will provide daughter with skilled facility list and continue dialogue regarding discharge planning so that a firm d/c plan will be in place prior to patient being medically stable for discharge.  Kyna Blahnik Givens, MSW, LCSW Licensed Clinical Social Worker [place] [phone]

## 2015-04-22 DIAGNOSIS — A4150 Gram-negative sepsis, unspecified: Secondary | ICD-10-CM

## 2015-04-22 DIAGNOSIS — R7881 Bacteremia: Secondary | ICD-10-CM

## 2015-04-22 LAB — CULTURE, BLOOD (ROUTINE X 2)

## 2015-04-22 LAB — CBC
HCT: 30.9 % — ABNORMAL LOW (ref 36.0–46.0)
Hemoglobin: 10.1 g/dL — ABNORMAL LOW (ref 12.0–15.0)
MCH: 29.7 pg (ref 26.0–34.0)
MCHC: 32.7 g/dL (ref 30.0–36.0)
MCV: 90.9 fL (ref 78.0–100.0)
PLATELETS: 930 10*3/uL — AB (ref 150–400)
RBC: 3.4 MIL/uL — AB (ref 3.87–5.11)
RDW: 19.2 % — ABNORMAL HIGH (ref 11.5–15.5)
WBC: 6.5 10*3/uL (ref 4.0–10.5)

## 2015-04-22 LAB — HEPARIN LEVEL (UNFRACTIONATED): Heparin Unfractionated: 0.48 IU/mL (ref 0.30–0.70)

## 2015-04-22 LAB — GLUCOSE, CAPILLARY
GLUCOSE-CAPILLARY: 134 mg/dL — AB (ref 65–99)
Glucose-Capillary: 106 mg/dL — ABNORMAL HIGH (ref 65–99)
Glucose-Capillary: 122 mg/dL — ABNORMAL HIGH (ref 65–99)
Glucose-Capillary: 130 mg/dL — ABNORMAL HIGH (ref 65–99)

## 2015-04-22 LAB — CREATININE, SERUM
CREATININE: 0.66 mg/dL (ref 0.44–1.00)
GFR calc Af Amer: >60 mL/min (ref >60)
GFR calc non Af Amer: >60 mL/min (ref >60)

## 2015-04-22 MED ORDER — DEXTROSE 5 % IV SOLN
2.0000 g | INTRAVENOUS | Status: DC
  Administered 2015-04-22 – 2015-04-28 (×8): 2 g via INTRAVENOUS
  Filled 2015-04-22 (×9): qty 2

## 2015-04-22 NOTE — Progress Notes (Signed)
Critical lab value, Platelets: 930,000. MD notified.   Ermalinda Memos, RN

## 2015-04-22 NOTE — Progress Notes (Signed)
[facility] PROGRESS NOTE  Tanya Farmer CLE:[id number] DOB: 10/01/1925 DOA: 04/12/2015  PCP: Blanchie Serve, MD  Brief HPI: 81 year old African-American female with a past medical history of dementia, atrial fibrillation, hypertension, presented with ischemic right foot and altered mental status. She was seen by vascular surgery who recommended conservative approach. She subsequently was noted to have bacteremia.  Past medical history:  Past Medical History  Diagnosis Date  . Hypertension   . Hyperlipidemia   . Thyroid disease     Hypothyroidism  . GERD (gastroesophageal reflux disease)   . Arthritis     Degenerative  . Peripheral vascular disease     Left   Fem-Pop and  tibial Thrombectomy  . High platelet count   . Dementia   . Pain in toe of right foot   . Hypothyroidism   . Long term (current) use of anticoagulants 03/13/2014  . Neuromuscular disorder   . Parkinson's disease     Consultants: Vascular surgery, palliative medicine  Procedures: None  Antibiotics: Zosyn, changed over to ceftriaxone 8/24  Subjective: Patient not very responsive. Unable to obtain any history.  Objective: Vital Signs  Filed Vitals:   04/21/15 1800 04/21/15 2130 04/22/15 0612 04/22/15 1011  BP: 117/73 145/86 151/83 143/82  Pulse: 85 88 95 87  Temp: 98.3 F (36.8 C) 98.3 F (36.8 C) 98 F (36.7 C) 98.1 F (36.7 C)  TempSrc: Oral Oral Oral Oral  Resp: 18 18 18 20   Height:      Weight:      SpO2: 100% 94% 100% 100%    Intake/Output Summary (Last 24 hours) at 04/22/15 1335 Last data filed at 04/22/15 0900  Gross per 24 hour  Intake   2000 ml  Output      0 ml  Net   2000 ml   Filed Weights   04/17/15 2021 04/18/15 1945 04/20/15 2056  Weight: 55 kg (121 lb 4.1 oz) 55.2 kg (121 lb 11.1 oz) 55.3 kg (121 lb 14.6 oz)    General appearance: Somnolent but arousable. In no distress. Resp: Diminished air entry at the bases. No crackles or wheezing. Cardio: regular rate  and rhythm, S1, S2 normal, no murmur, click, rub or gallop GI: soft, non-tender; bowel sounds normal; no masses,  no organomegaly Extremities: Right foot is cold to touch. Black discoloration. Neurologic: Not very communicative. Confused. Unable to assess for other neurological deficits  Lab Results:  Basic Metabolic Panel:  Recent Labs Lab 04/16/15 1852 04/22/15 0550  NA 136  --   K 4.1  --   CL 106  --   CO2 21*  --   GLUCOSE 150*  --   BUN <5*  --   CREATININE 0.67 0.66  CALCIUM 8.6*  --    Liver Function Tests:  Recent Labs Lab 04/16/15 1852  AST 27  ALT 18  ALKPHOS 55  BILITOT 0.5  PROT 6.7  ALBUMIN 2.6*    Recent Labs Lab 04/16/15 1852 04/18/15 1317  AMMONIA 50* 46*   CBC:  Recent Labs Lab 04/18/15 0422 04/19/15 0553 04/20/15 0442 04/21/15 0639 04/22/15 0550  WBC 6.6 7.0 7.0 5.8 6.5  HGB 11.3* 11.4* 10.3* 10.1* 10.1*  HCT 34.1* 34.6* 31.7* 30.4* 30.9*  MCV 92.9 91.8 90.1 90.2 90.9  PLT 829* 804* 627* 773* 930*    CBG:  Recent Labs Lab 04/21/15 1218 04/21/15 1759 04/22/15 0017 04/22/15 0608 04/22/15 1132  GLUCAP 129* 127* 122* 106* 134*    Recent Results (  from the past 240 hour(s))  MRSA PCR Screening     Status: None   Collection Time: 04/13/15  3:50 AM  Result Value Ref Range Status   MRSA by PCR NEGATIVE NEGATIVE Final    Comment:        The GeneXpert MRSA Assay (FDA approved for NASAL specimens only), is one component of a comprehensive MRSA colonization surveillance program. It is not intended to diagnose MRSA infection nor to guide or monitor treatment for MRSA infections.   Culture, blood (routine x 2)     Status: None   Collection Time: 04/19/15  1:00 PM  Result Value Ref Range Status   Specimen Description BLOOD BLOOD RIGHT ARM  Final   Special Requests BOTTLES DRAWN AEROBIC AND ANAEROBIC 5CC  Final   Culture  Setup Time   Final    GRAM NEGATIVE RODS IN BOTH AEROBIC AND ANAEROBIC BOTTLES CRITICAL RESULT CALLED  TO, READ BACK BY AND VERIFIED WITH: TO KGENGLER(RN) BY TCLEVELAND 02/18/2015 AT 2:54AM    Culture PROVIDENCIA STUARTII  Final   Report Status 04/22/2015 FINAL  Final   Organism ID, Bacteria PROVIDENCIA STUARTII  Final      Susceptibility   Providencia stuartii - MIC*    AMPICILLIN >=32 RESISTANT Resistant     CEFAZOLIN >=64 RESISTANT Resistant     CEFEPIME <=1 SENSITIVE Sensitive     CEFTAZIDIME 16 INTERMEDIATE Intermediate     CEFTRIAXONE <=1 SENSITIVE Sensitive     CIPROFLOXACIN 2 INTERMEDIATE Intermediate     GENTAMICIN 4 RESISTANT Resistant     IMIPENEM 2 SENSITIVE Sensitive     TRIMETH/SULFA >=320 RESISTANT Resistant     AMPICILLIN/SULBACTAM 16 INTERMEDIATE Intermediate     PIP/TAZO <=4 SENSITIVE Sensitive     * PROVIDENCIA STUARTII  Culture, blood (routine x 2)     Status: None   Collection Time: 04/19/15  1:00 PM  Result Value Ref Range Status   Specimen Description BLOOD BLOOD RIGHT ARM  Final   Special Requests BOTTLES DRAWN AEROBIC AND ANAEROBIC 5CC  Final   Culture  Setup Time   Final    GRAM NEGATIVE RODS IN BOTH AEROBIC AND ANAEROBIC BOTTLES CRITICAL RESULT CALLED TO, READ BACK BY AND VERIFIED WITH: TO KGENGLER(RN) BY TCLEVELAND 04/20/2015 AT 2:56AM    Culture   Final    PROVIDENCIA STUARTII SUSCEPTIBILITIES PERFORMED ON PREVIOUS CULTURE WITHIN THE LAST 5 DAYS.    Report Status 04/22/2015 FINAL  Final  Culture, blood (routine x 2)     Status: None (Preliminary result)   Collection Time: 04/22/15 11:35 AM  Result Value Ref Range Status   Specimen Description BLOOD RIGHT HAND  Final   Special Requests BOTTLES DRAWN AEROBIC ONLY 10CC  Final   Culture PENDING  Incomplete   Report Status PENDING  Incomplete      Studies/Results: No results found.  Medications:  Scheduled: . acetaminophen  650 mg Oral TID  . antiseptic oral rinse  7 mL Mouth Rinse BID  . atorvastatin  10 mg Oral q1800  . calcium-vitamin D  1 tablet Oral BID  . cefTRIAXone (ROCEPHIN)  IV  2 g  Intravenous Q24H  . ferrous sulfate  325 mg Oral BID WC  . hydroxyurea  1,000 mg Oral Q breakfast  . levothyroxine  100 mcg Intravenous Daily  . multivitamin  5 mL Oral Daily  . pantoprazole (PROTONIX) IV  40 mg Intravenous Q24H  . senna-docusate  1 tablet Oral QPM  . sodium chloride  1  spray Each Nare Daily   Continuous: . dextrose 5 % and 0.45 % NaCl with KCl 20 mEq/L 75 mL/hr at 04/22/15 0010  . heparin 850 Units/hr (04/20/15 0930)   GBT:DVVOHYWVPXT, labetalol, methocarbamol, ondansetron **OR** ondansetron (ZOFRAN) IV  Assessment/Plan:  Principal Problem:   Ischemia of foot Active Problems:   Essential thrombocytosis   Hypothyroidism   Dyslipidemia   Paroxysmal atrial fibrillation   Advanced dementia   Dry gangrene   Goals of care, counseling/discussion   Encounter for palliative care   Rash and nonspecific skin eruption    Ischemic right foot Seen by vascular surgery. Conservative approach as patient is not a candidate for surgery. Continue symptomatic treatment.. Heparin IV while not able to take her oral anticoagulation medication.   Gram-negative Bacteremia/sepsis Final culture reports are available. Providencia was isolated. Narrow antibiotics spectrum. Source is likely her ischemic foot. Patient remains afebrile. WBC is normal.  Acute encephalopathy Evaluation revealed increased ammonia level. TSH was elevated. She was started on intravenous levothyroxine. However her free T4 was noted to be elevated. She was also started on lactulose. Mental status did not change appreciably. However, with initiation of antibiotics it appeared that there was an improvement. It is unclear if the improvement was significant or not. Patient is not very responsive today.  History of Atrial fibrillation Heart rate is controlled, continue anticoagulation with IV heparin while unable to swallow medication due to hypersomnolence.  Essential Hypertension Monitor blood pressures closely.  Well controlled off antihypertensive regimen.  Thrombocytosis Continue hydroxyurea once patient able to tolerate oral regimen.  H/o dementia Mental status is worse compared to her usual baseline. According to reports.   Goals of care Patient seen by palliative medicine. She doesn't appear to be making much improvement. Will need to have a repeat conversation with family regarding goals of care if there is no change in the next 24-48 hours.  DVT Prophylaxis: On IV heparin    Code Status: DO NOT RESUSCITATE  Family Communication: No family at bedside  Disposition Plan: Unclear for now.    LOS: 10 days   [facility] Pager [phone] 04/22/2015, 1:35 PM  If 7PM-7AM, please contact night-coverage at [email or web address], password [facility]

## 2015-04-22 NOTE — Clinical Documentation Improvement (Signed)
Family Medicine  Can the diagnosis of systemic infection be further specified?   Systemic Infection (Sepsis) has ruled in; blood cultures positive for gm negative rods  Bacteremia only as documented   Other  Clinically Undetermined  Document any associated diagnoses/conditions.  Supporting Information:  Questionable source of infection is documented with bacteremia in 8/23 progress note  Hypersomnolence/AMS improving with IV Zosyn  Tachycardic 91-106, but WBC wnl  Please exercise your independent, professional judgment when responding. A specific answer is not anticipated or expected.  Thank You,  [place] [phone]

## 2015-04-22 NOTE — Progress Notes (Signed)
Patient hard to arouse and too lethargic to take PO medications. RN will continue to monitor patient.  Ermalinda Memos, RN

## 2015-04-22 NOTE — Progress Notes (Signed)
ANTICOAGULATION & ANTIBIOTIC CONSULT NOTE - Follow Up Consult  Pharmacy Consult for Heparin & Rocephin  Indication: atrial fibrillation + ischemic R-foot & Providencia Stuartii Bacteremia  No Known Allergies  Patient Measurements: Height: 5\' 3"  (160 cm) Weight: 121 lb 14.6 oz (55.3 kg) IBW/kg (Calculated) : 52.4 Heparin Dosing Weight: 55 kg  Vital Signs: Temp: 98.1 F (36.7 C) (08/24 1011) Temp Source: Oral (08/24 1011) BP: 143/82 mmHg (08/24 1011) Pulse Rate: 87 (08/24 1011)  Labs:  Recent Labs  04/20/15 0442 04/20/15 1654 04/21/15 0639 04/22/15 0550  HGB 10.3*  --  10.1* 10.1*  HCT 31.7*  --  30.4* 30.9*  PLT 627*  --  773* 930*  HEPARINUNFRC 0.28* 0.56 0.36 0.48  CREATININE  --   --   --  0.66    Estimated Creatinine Clearance: 41 mL/min (by C-G formula based on Cr of 0.66).   Medications:  Heparin @ 850 units/hr (8.5 ml/hr)  Assessment: 89 YOF on Apixaban PTA for hx Afib and ischemic R-foot - now transitioned to IV Heparin given poor oral intake while awaiting palliative plans. The patient's IV site infiltrated prior to AM labs on 8/22 - it has since been resumed and remains in the therapeutic range (HL 0.48 << 0.36, goal of 0.3-0.7). CBC stable - no overt signs/symptoms of bleeding noted at this time.   The patient is also to narrow from Zosyn to Rocephin this morning for continued treatment of Providencia Stuartii bacteremia. Today is D#3 of treatment - usual recommended duration is 10-14 days. Repeat blood cultures have been ordered to check for clearance.   Goal of Therapy:  Heparin level 0.3-0.7 units/ml Monitor platelets by anticoagulation protocol: Yes  Proper antibiotics for infection/cultures adjusted for renal/hepatic function    Plan:  1. Continue heparin at 850 units/hr (8.5 ml/hr) 2. D/c Zosyn and start Ceftriaxone 2g IV every 24 hours 3. Will continue to monitor for any signs/symptoms of bleeding and will follow up with heparin level in the a.m.   4. Will continue to follow renal function and LOT plans  Alycia Rossetti, PharmD, BCPS Clinical Pharmacist Pager: [phone] 04/22/2015 10:57 AM

## 2015-04-23 DIAGNOSIS — E038 Other specified hypothyroidism: Secondary | ICD-10-CM

## 2015-04-23 LAB — GLUCOSE, CAPILLARY
GLUCOSE-CAPILLARY: 135 mg/dL — AB (ref 65–99)
Glucose-Capillary: 109 mg/dL — ABNORMAL HIGH (ref 65–99)
Glucose-Capillary: 111 mg/dL — ABNORMAL HIGH (ref 65–99)
Glucose-Capillary: 122 mg/dL — ABNORMAL HIGH (ref 65–99)

## 2015-04-23 LAB — CBC
HEMATOCRIT: 29.6 % — AB (ref 36.0–46.0)
Hemoglobin: 9.9 g/dL — ABNORMAL LOW (ref 12.0–15.0)
MCH: 30.1 pg (ref 26.0–34.0)
MCHC: 33.4 g/dL (ref 30.0–36.0)
MCV: 90 fL (ref 78.0–100.0)
Platelets: 1162 10*3/uL (ref 150–400)
RBC: 3.29 MIL/uL — ABNORMAL LOW (ref 3.87–5.11)
RDW: 19.8 % — AB (ref 11.5–15.5)
WBC: 8.5 10*3/uL (ref 4.0–10.5)

## 2015-04-23 LAB — HEPARIN LEVEL (UNFRACTIONATED): Heparin Unfractionated: 0.38 IU/mL (ref 0.30–0.70)

## 2015-04-23 MED ORDER — APIXABAN 2.5 MG PO TABS
2.5000 mg | ORAL_TABLET | Freq: Two times a day (BID) | ORAL | Status: DC
  Administered 2015-04-23 – 2015-04-29 (×10): 2.5 mg via ORAL
  Filled 2015-04-23 (×13): qty 1

## 2015-04-23 NOTE — Care Management Important Message (Signed)
Important Message  Patient Details  Name: Tanya Farmer MRN: [id number] Date of Birth: 10-04-1925   Medicare Important Message Given:  Yes-fourth notification given    Delorse Lek 04/23/2015, 12:05 PM

## 2015-04-23 NOTE — Progress Notes (Signed)
ANTICOAGULATION & ANTIBIOTIC CONSULT NOTE - Follow Up Consult  Pharmacy Consult for Heparin >> Apixaban Indication: atrial fibrillation + ischemic R-foot  No Known Allergies  Patient Measurements: Height: 5\' 3"  (160 cm) Weight: 126 lb 12.2 oz (57.5 kg) IBW/kg (Calculated) : 52.4  Vital Signs: Temp: 98.7 F (37.1 C) (08/25 0500) Temp Source: Oral (08/25 0500) BP: 148/94 mmHg (08/25 0500) Pulse Rate: 117 (08/25 0500)  Labs:  Recent Labs  04/21/15 0639 04/22/15 0550 04/23/15 0655  HGB 10.1* 10.1* 9.9*  HCT 30.4* 30.9* 29.6*  PLT 773* 930* 1162*  HEPARINUNFRC 0.36 0.48 0.38  CREATININE  --  0.66  --     Estimated Creatinine Clearance: 41 mL/min (by C-G formula based on Cr of 0.66).   Medications:  Heparin @ 850 units/hr (8.5 ml/hr)  Assessment: 89 YOF on Apixaban PTA for hx Afib and ischemic R-foot - now transitioned to IV Heparin given poor oral intake while awaiting palliative plans. The patient's IV site infiltrated prior to AM labs on 8/22 - it has since been resumed and remains in the therapeutic range (HL 0.38 << 0.48, goal of 0.3-0.7). CBC stable - no overt signs/symptoms of bleeding noted at this time. Given the patient's improved oral intake - plans are to transition the patient back to apixaban this morning.   Goal of Therapy:  Appropriate anticoagulation for indication and hepatic/renal function   Plan:  1. D/c heparin 2. Resume apixaban 2.5 mg twice daily 3. Will continue to monitor for any signs/symptoms of bleeding and po intake to ensure doses are charted as given  Alycia Rossetti, PharmD, BCPS Clinical Pharmacist Pager: [phone] 04/23/2015 11:28 AM

## 2015-04-23 NOTE — Clinical Documentation Improvement (Signed)
Hospitalist  Please clarify if the following diagnosis, Sepsis was:   Present at the time of admission (POA)  NOT present at the time of admission and it developed during the inpatient stay  Unable to clinically determine whether the condition was present on admission.  Unknown  Please exercise your independent, professional judgment when responding. A specific answer is not anticipated or expected.  Thank You,  Whitney Garfield [phone]

## 2015-04-23 NOTE — Progress Notes (Signed)
[facility] PROGRESS NOTE  Tanya Farmer DUK:[id number] DOB: 25-Dec-1925 DOA: 04/12/2015  PCP: Blanchie Serve, MD  Brief HPI: 81 year old African-American female with a past medical history of dementia, atrial fibrillation, hypertension, presented with ischemic right foot and altered mental status. She was seen by vascular surgery who recommended conservative approach. She subsequently was noted to have bacteremia. Palliative medicine is also following. Waiting for some improvement. If she doesn't improve, further discussions to be held with family.  Past medical history:  Past Medical History  Diagnosis Date  . Hypertension   . Hyperlipidemia   . Thyroid disease     Hypothyroidism  . GERD (gastroesophageal reflux disease)   . Arthritis     Degenerative  . Peripheral vascular disease     Left   Fem-Pop and  tibial Thrombectomy  . High platelet count   . Dementia   . Pain in toe of right foot   . Hypothyroidism   . Long term (current) use of anticoagulants 03/13/2014  . Neuromuscular disorder   . Parkinson's disease     Consultants: Vascular surgery, palliative medicine  Procedures: None  Antibiotics: Zosyn, changed over to ceftriaxone 8/24  Subjective: Patient not very responsive. Per nurse, she did have her breakfast this morning.  Objective: Vital Signs  Filed Vitals:   04/22/15 1011 04/22/15 1744 04/22/15 2137 04/23/15 0500  BP: 143/82 145/84 139/71 148/94  Pulse: 87 85 96 117  Temp: 98.1 F (36.7 C) 99.1 F (37.3 C) 97.3 F (36.3 C) 98.7 F (37.1 C)  TempSrc: Oral Oral Oral Oral  Resp: 20 18 16    Height:      Weight:   57.5 kg (126 lb 12.2 oz)   SpO2: 100% 98% 100% 100%    Intake/Output Summary (Last 24 hours) at 04/23/15 0954 Last data filed at 04/22/15 1853  Gross per 24 hour  Intake    300 ml  Output      0 ml  Net    300 ml   Filed Weights   04/18/15 1945 04/20/15 2056 04/22/15 2137  Weight: 55.2 kg (121 lb 11.1 oz) 55.3 kg (121 lb 14.6  oz) 57.5 kg (126 lb 12.2 oz)    General appearance: Somnolent. In no distress. Resp: Diminished air entry at the bases. No crackles or wheezing. Cardio: regular rate and rhythm, S1, S2 normal, no murmur, click, rub or gallop GI: soft, non-tender; bowel sounds normal; no masses,  no organomegaly Extremities: Right foot is cold to touch. Black discoloration. Neurologic: Not very communicative.   Lab Results:  Basic Metabolic Panel:  Recent Labs Lab 04/16/15 1852 04/22/15 0550  NA 136  --   K 4.1  --   CL 106  --   CO2 21*  --   GLUCOSE 150*  --   BUN <5*  --   CREATININE 0.67 0.66  CALCIUM 8.6*  --    Liver Function Tests:  Recent Labs Lab 04/16/15 1852  AST 27  ALT 18  ALKPHOS 55  BILITOT 0.5  PROT 6.7  ALBUMIN 2.6*    Recent Labs Lab 04/16/15 1852 04/18/15 1317  AMMONIA 50* 46*   CBC:  Recent Labs Lab 04/19/15 0553 04/20/15 0442 04/21/15 0639 04/22/15 0550 04/23/15 0655  WBC 7.0 7.0 5.8 6.5 8.5  HGB 11.4* 10.3* 10.1* 10.1* 9.9*  HCT 34.6* 31.7* 30.4* 30.9* 29.6*  MCV 91.8 90.1 90.2 90.9 90.0  PLT 804* 627* 773* 930* PENDING    CBG:  Recent Labs Lab  04/22/15 0608 04/22/15 1132 04/22/15 1759 04/23/15 0027 04/23/15 0810  GLUCAP 106* 134* 130* 111* 109*    Recent Results (from the past 240 hour(s))  Culture, blood (routine x 2)     Status: None   Collection Time: 04/19/15  1:00 PM  Result Value Ref Range Status   Specimen Description BLOOD BLOOD RIGHT ARM  Final   Special Requests BOTTLES DRAWN AEROBIC AND ANAEROBIC 5CC  Final   Culture  Setup Time   Final    GRAM NEGATIVE RODS IN BOTH AEROBIC AND ANAEROBIC BOTTLES CRITICAL RESULT CALLED TO, READ BACK BY AND VERIFIED WITH: TO KGENGLER(RN) BY TCLEVELAND 02/18/2015 AT 2:54AM    Culture PROVIDENCIA STUARTII  Final   Report Status 04/22/2015 FINAL  Final   Organism ID, Bacteria PROVIDENCIA STUARTII  Final      Susceptibility   Providencia stuartii - MIC*    AMPICILLIN >=32 RESISTANT  Resistant     CEFAZOLIN >=64 RESISTANT Resistant     CEFEPIME <=1 SENSITIVE Sensitive     CEFTAZIDIME 16 INTERMEDIATE Intermediate     CEFTRIAXONE <=1 SENSITIVE Sensitive     CIPROFLOXACIN 2 INTERMEDIATE Intermediate     GENTAMICIN 4 RESISTANT Resistant     IMIPENEM 2 SENSITIVE Sensitive     TRIMETH/SULFA >=320 RESISTANT Resistant     AMPICILLIN/SULBACTAM 16 INTERMEDIATE Intermediate     PIP/TAZO <=4 SENSITIVE Sensitive     * PROVIDENCIA STUARTII  Culture, blood (routine x 2)     Status: None   Collection Time: 04/19/15  1:00 PM  Result Value Ref Range Status   Specimen Description BLOOD BLOOD RIGHT ARM  Final   Special Requests BOTTLES DRAWN AEROBIC AND ANAEROBIC 5CC  Final   Culture  Setup Time   Final    GRAM NEGATIVE RODS IN BOTH AEROBIC AND ANAEROBIC BOTTLES CRITICAL RESULT CALLED TO, READ BACK BY AND VERIFIED WITH: TO KGENGLER(RN) BY TCLEVELAND 04/20/2015 AT 2:56AM    Culture   Final    PROVIDENCIA STUARTII SUSCEPTIBILITIES PERFORMED ON PREVIOUS CULTURE WITHIN THE LAST 5 DAYS.    Report Status 04/22/2015 FINAL  Final  Culture, blood (routine x 2)     Status: None (Preliminary result)   Collection Time: 04/22/15 11:35 AM  Result Value Ref Range Status   Specimen Description BLOOD RIGHT HAND  Final   Special Requests BOTTLES DRAWN AEROBIC ONLY 10CC  Final   Culture PENDING  Incomplete   Report Status PENDING  Incomplete      Studies/Results: No results found.  Medications:  Scheduled: . acetaminophen  650 mg Oral TID  . antiseptic oral rinse  7 mL Mouth Rinse BID  . atorvastatin  10 mg Oral q1800  . calcium-vitamin D  1 tablet Oral BID  . cefTRIAXone (ROCEPHIN)  IV  2 g Intravenous Q24H  . ferrous sulfate  325 mg Oral BID WC  . hydroxyurea  1,000 mg Oral Q breakfast  . levothyroxine  100 mcg Intravenous Daily  . multivitamin  5 mL Oral Daily  . pantoprazole (PROTONIX) IV  40 mg Intravenous Q24H  . senna-docusate  1 tablet Oral QPM  . sodium chloride  1 spray  Each Nare Daily   Continuous: . dextrose 5 % and 0.45 % NaCl with KCl 20 mEq/L 75 mL/hr at 04/23/15 0217  . heparin 850 Units/hr (04/20/15 0930)   KGU:[id number], labetalol, methocarbamol, ondansetron **OR** ondansetron (ZOFRAN) IV  Assessment/Plan:  Principal Problem:   Ischemia of foot Active Problems:   Essential thrombocytosis  Hypothyroidism   Dyslipidemia   Paroxysmal atrial fibrillation   Advanced dementia   Dry gangrene   Goals of care, counseling/discussion   Encounter for palliative care   Rash and nonspecific skin eruption    Ischemic right foot Seen by vascular surgery. Conservative approach as patient is not a candidate for surgery. Continue symptomatic treatment.   Gram-negative Bacteremia/sepsis , likely from ischemic foot Final culture reports are available. Providencia was isolated. Narrow antibiotics spectrum. Source is likely her ischemic foot. Patient remains afebrile. WBC is normal.  Acute encephalopathy Evaluation revealed increased ammonia level. TSH was elevated. However her free T4 was noted to be elevated. She was also started on lactulose. Mental status did not change appreciably. However, with initiation of antibiotics it appeared that there was an improvement. It is unclear if the improvement was significant or not. Patient remains poorly responsive.  History of hypothyroidism Currently on intravenous levothyroxin. Can be changed over to oral once she is taking orally.  History of Atrial fibrillation Heart rate is controlled. Change to oral Eliquis.  Essential Hypertension Monitor blood pressures closely. Well controlled off antihypertensive regimen.  Thrombocytosis Continue hydroxyurea once patient able to tolerate oral regimen.  H/o dementia Mental status is worse compared to her usual baseline. According to reports.   Goals of care Patient seen by palliative medicine. She doesn't appear to be making much improvement. Will need to  have a repeat conversation with family regarding goals of care if there is no change in the next 24-48 hours.  DVT Prophylaxis: Eliquis  Code Status: DO NOT RESUSCITATE  Family Communication: No family at bedside  Disposition Plan: Unclear for now.    LOS: 11 days   [facility] Pager [phone] 04/23/2015, 9:54 AM  If 7PM-7AM, please contact night-coverage at [email or web address], password [facility]

## 2015-04-24 LAB — CBC
HEMATOCRIT: 29.1 % — AB (ref 36.0–46.0)
HEMOGLOBIN: 9.5 g/dL — AB (ref 12.0–15.0)
MCH: 28.9 pg (ref 26.0–34.0)
MCHC: 32.6 g/dL (ref 30.0–36.0)
MCV: 88.4 fL (ref 78.0–100.0)
Platelets: 1021 10*3/uL (ref 150–400)
RBC: 3.29 MIL/uL — ABNORMAL LOW (ref 3.87–5.11)
RDW: 20.4 % — ABNORMAL HIGH (ref 11.5–15.5)
WBC: 9.2 10*3/uL (ref 4.0–10.5)

## 2015-04-24 LAB — GLUCOSE, CAPILLARY
GLUCOSE-CAPILLARY: 116 mg/dL — AB (ref 65–99)
GLUCOSE-CAPILLARY: 125 mg/dL — AB (ref 65–99)
GLUCOSE-CAPILLARY: 126 mg/dL — AB (ref 65–99)
GLUCOSE-CAPILLARY: 141 mg/dL — AB (ref 65–99)
Glucose-Capillary: 130 mg/dL — ABNORMAL HIGH (ref 65–99)

## 2015-04-24 LAB — BASIC METABOLIC PANEL
ANION GAP: 8 (ref 5–15)
BUN: <5 mg/dL — ABNORMAL LOW (ref 6–20)
CHLORIDE: 110 mmol/L (ref 101–111)
CO2: 21 mmol/L — AB (ref 22–32)
Calcium: 8.2 mg/dL — ABNORMAL LOW (ref 8.9–10.3)
Creatinine, Ser: 0.57 mg/dL (ref 0.44–1.00)
GFR calc non Af Amer: >60 mL/min (ref >60)
Glucose, Bld: 124 mg/dL — ABNORMAL HIGH (ref 65–99)
Potassium: 3.5 mmol/L (ref 3.5–5.1)
Sodium: 139 mmol/L (ref 135–145)

## 2015-04-24 MED ORDER — ENSURE ENLIVE PO LIQD
237.0000 mL | Freq: Three times a day (TID) | ORAL | Status: DC
  Administered 2015-04-25 – 2015-04-27 (×2): 237 mL via ORAL

## 2015-04-24 NOTE — Progress Notes (Signed)
Patient was able to eat 2 cups of magic cup and a cup and half of pudding. Daughter at bedside.

## 2015-04-24 NOTE — Evaluation (Addendum)
Physical Therapy Evaluation and discharge Patient Details Name: Tanya Farmer MRN: [id number] DOB: 06/23/1926 Today's Date: 04/24/2015   History of Present Illness  Tanya Farmer is a 81 y.o. female with history of atrial fibrillation on the Crixivan, chronic kidney disease stage III, hypertension, hypothyroidism and advanced dementia was brought to the ER after patient was found to have darkening of her right foot fifth toe.  Clinical Impression  Pt only opened her eyes once during session and was not able to participate with bed mobility or sitting balance.  Pt was evaluated by PT 04/13/15 and pt has had no change since then warranting skilled PT services and will discharge from PT services.  Recommend 24 hour S and will need hospital bed with air mattress overlay.  If family is unable to provide 24 hour care then recommend SNF.    Follow Up Recommendations No PT follow up;Supervision/Assistance - 24 hour    Equipment Recommendations  Hospital bed (with air mattress overlay)    Recommendations for Other Services       Precautions / Restrictions        Mobility  Bed Mobility Overal bed mobility: Needs Assistance;+2 for physical assistance Bed Mobility: Sit to Supine;Supine to Sit     Supine to sit: +2 for physical assistance;Total assist Sit to supine: Total assist;+2 for physical assistance   General bed mobility comments: Total A for all bed mobility.  Increased lean to the R  Transfers                 General transfer comment: Unable to attempt.  Spoke with nursing who stated they were going to try and get pt up later with a lift.  Ambulation/Gait                Stairs            Wheelchair Mobility    Modified Rankin (Stroke Patients Only)       Balance Overall balance assessment: Needs assistance Sitting-balance support: Feet supported;Single extremity supported Sitting balance-Leahy Scale: Zero Sitting balance - Comments: Tends to lean to the  right and needs MAX A for maintaining upright position.  Pt did open eyes once when back was being rubbed with cool washcloth.  Attempted to get pt to take a bite of food and used oral swab to attempt to get pt to open mouth to drink, but pt resistant and wouldn't open mouth. Postural control: Right lateral lean                                   Pertinent Vitals/Pain Pain Assessment: Faces Faces Pain Scale: Hurts little more Pain Location: with PROM to R LE Pain Intervention(s): Repositioned    Home Living Family/patient expects to be discharged to:: Skilled nursing facility                      Prior Function Level of Independence: Needs assistance   Gait / Transfers Assistance Needed: per chart review, recently pt has been essentially bedbound  ADL's / Homemaking Assistance Needed: Pt dependent with ADL's.        Hand Dominance        Extremity/Trunk Assessment   Upper Extremity Assessment: Defer to OT evaluation           Lower Extremity Assessment: RLE deficits/detail;LLE deficits/detail RLE Deficits / Details: R LE knee contracture with 4th  and 5th toe darkened.  Pt very resistant to PROM especially extension. LLE Deficits / Details: Resistant to PROM especially flexion     Communication   Communication: Other (comment) (non-communicative)  Cognition Arousal/Alertness: Lethargic Behavior During Therapy:  (non-interactive) Overall Cognitive Status: No family/caregiver present to determine baseline cognitive functioning (hx of dementia- but not sure severity)                      General Comments      Exercises        Assessment/Plan    PT Assessment Patent does not need any further PT services  PT Diagnosis Generalized weakness;Altered mental status   PT Problem List    PT Treatment Interventions     PT Goals (Current goals can be found in the Care Plan section) Acute Rehab PT Goals Patient Stated Goal: unable to  state PT Goal Formulation: All assessment and education complete, DC therapy    Frequency     Barriers to discharge        Co-evaluation PT/OT/SLP Co-Evaluation/Treatment: Yes Reason for Co-Treatment: For patient/therapist safety PT goals addressed during session: Balance;Strengthening/ROM         End of Session   Activity Tolerance: Patient limited by lethargy Patient left: in bed;with call bell/phone within reach (in semi R sidelying) Nurse Communication: Mobility status         Time: 1062-6948 PT Time Calculation (min) (ACUTE ONLY): 23 min   Charges:   PT Evaluation $Initial PT Evaluation Tier I: 1 Procedure     PT G Codes:        Tanya Farmer,Tanya Farmer 04/24/2015, 1:30 PM

## 2015-04-24 NOTE — Evaluation (Signed)
Occupational Therapy Evaluation Patient Details Name: Tanya Farmer MRN: [id number] DOB: [birth year] Today's Date: 04/24/2015    History of Present Illness Tanya Farmer is a 81 y.o. female with history of atrial fibrillation on the Crixivan, chronic kidney disease stage III, hypertension, hypothyroidism and advanced dementia was brought to the ER after patient was found to have darkening of her right foot fifth toe.   Clinical Impression   Pt with lethargy and not participatory.  Requires total assist for ADL and +2 assist for all mobility. Sat EOB with assist for balance and attempted to arouse pt for eating without success. No OT needs identified.    Follow Up Recommendations  Supervision/Assistance - 24 hour    Equipment Recommendations  Hospital bed (with mattress overlay)    Recommendations for Other Services       Precautions / Restrictions Precautions Precautions: Fall Restrictions Weight Bearing Restrictions: No      Mobility Bed Mobility Overal bed mobility: Needs Assistance;+2 for physical assistance Bed Mobility: Sit to Supine;Supine to Sit     Supine to sit: +2 for physical assistance;Total assist Sit to supine: Total assist;+2 for physical assistance   General bed mobility comments: Total A for all bed mobility.  Increased lean to the R  Transfers                 General transfer comment: Unable to attempt.  Spoke with nursing who stated they were going to try and get pt up later with a lift.    Balance Overall balance assessment: Needs assistance Sitting-balance support: Feet supported;Single extremity supported Sitting balance-Leahy Scale: Zero Sitting balance - Comments: Tends to lean to the right and needs MAX A for maintaining upright position.  Pt did open eyes once when back was being rubbed with cool washcloth.  Attempted to get pt to take a bite of food and used oral swab to attempt to get pt to open mouth to drink, but pt resistant and  wouldn't open mouth. Postural control: Right lateral lean                                  ADL                                         General ADL Comments: total assist     Vision Additional Comments: unable to assess vision, pt with eyes closed most of session   Perception     Praxis      Pertinent Vitals/Pain Pain Assessment: Faces Faces Pain Scale: Hurts little more Pain Location: with PROM to R LE Pain Intervention(s): Repositioned     Hand Dominance     Extremity/Trunk Assessment Upper Extremity Assessment Upper Extremity Assessment: Generalized weakness (Full PROM, resistant to passive movement L>R)   Lower Extremity Assessment Lower Extremity Assessment: RLE deficits/detail;LLE deficits/detail RLE Deficits / Details: R LE knee contracture with 4th and 5th toe darkened.  Pt very resistant to PROM especially extension. LLE Deficits / Details: Resistant to PROM especially flexion   Cervical / Trunk Assessment Cervical / Trunk Assessment: Kyphotic (flexed neck)   Communication Communication Communication: Other (comment) (non-communicative)   Cognition Arousal/Alertness: Lethargic Behavior During Therapy:  (non-interactive) Overall Cognitive Status: No family/caregiver present to determine baseline cognitive functioning (hx of dementia- but not sure severity)  General Comments       Exercises       Shoulder Instructions      Home Living Family/patient expects to be discharged to:: Skilled nursing facility                                        Prior Functioning/Environment Level of Independence: Needs assistance  Gait / Transfers Assistance Needed: per chart review, recently pt has been essentially bedbound ADL's / Homemaking Assistance Needed: Pt dependent with ADL's. Communication / Swallowing Assistance Needed: HOH      OT Diagnosis:     OT Problem List:     OT  Treatment/Interventions:      OT Goals(Current goals can be found in the care plan section) Acute Rehab OT Goals Patient Stated Goal: unable to state  OT Frequency:     Barriers to D/C:            Co-evaluation PT/OT/SLP Co-Evaluation/Treatment: Yes Reason for Co-Treatment: For patient/therapist safety;Necessary to address cognition/behavior during functional activity PT goals addressed during session: Balance;Strengthening/ROM OT goals addressed during session: ADL's and self-care;Strengthening/ROM      End of Session Nurse Communication: Mobility status  Activity Tolerance: Patient limited by lethargy Patient left: in bed;with call bell/phone within reach   Time: 1242-1305 OT Time Calculation (min): 23 min Charges:  OT General Charges $OT Visit: 1 Procedure OT Evaluation $Initial OT Evaluation Tier I: 1 Procedure G-Codes:    Malka So 04/24/2015, 2:00 PM  [phone]

## 2015-04-24 NOTE — Progress Notes (Signed)
[facility] PROGRESS NOTE  ALAN RILES EXH:[id number] DOB: 1926/04/21 DOA: 04/12/2015  PCP: Blanchie Serve, MD  Brief HPI: 81 year old African-American female with a past medical history of dementia, atrial fibrillation, hypertension, presented with ischemic right foot and altered mental status. She was seen by vascular surgery who recommended conservative approach. She subsequently was noted to have bacteremia. Palliative medicine is also following. Waiting for some improvement. If she doesn't improve, further discussions to be held with family.  Past medical history:  Past Medical History  Diagnosis Date  . Hypertension   . Hyperlipidemia   . Thyroid disease     Hypothyroidism  . GERD (gastroesophageal reflux disease)   . Arthritis     Degenerative  . Peripheral vascular disease     Left   Fem-Pop and  tibial Thrombectomy  . High platelet count   . Dementia   . Pain in toe of right foot   . Hypothyroidism   . Long term (current) use of anticoagulants 03/13/2014  . Neuromuscular disorder   . Parkinson's disease     Consultants: Vascular surgery, palliative medicine  Procedures: None  Antibiotics: Zosyn, changed over to ceftriaxone 8/24  Subjective: Patient not very responsive. Patient's daughter is by the bedside and would like to revisit with Dr. early to decide if the patient is a surgical candidate or not  Objective: Vital Signs  Filed Vitals:   04/23/15 1739 04/23/15 2154 04/24/15 0431 04/24/15 0945  BP: 141/90 184/114 121/73 110/64  Pulse: 106 64 100 96  Temp: 98.5 F (36.9 C) 98.7 F (37.1 C) 99.3 F (37.4 C) 98 F (36.7 C)  TempSrc: Oral Axillary Axillary Oral  Resp:  22 22 22   Height:      Weight:  58.2 kg (128 lb 4.9 oz)    SpO2: 100% 100% 100% 100%    Intake/Output Summary (Last 24 hours) at 04/24/15 1200 Last data filed at 04/24/15 0900  Gross per 24 hour  Intake 3842.5 ml  Output      0 ml  Net 3842.5 ml   Filed Weights   04/20/15  2056 04/22/15 2137 04/23/15 2154  Weight: 55.3 kg (121 lb 14.6 oz) 57.5 kg (126 lb 12.2 oz) 58.2 kg (128 lb 4.9 oz)    General appearance: Somnolent. In no distress. Resp: Diminished air entry at the bases. No crackles or wheezing. Cardio: regular rate and rhythm, S1, S2 normal, no murmur, click, rub or gallop GI: soft, non-tender; bowel sounds normal; no masses,  no organomegaly Extremities: Right foot is cold to touch. Black discoloration. Neurologic: Not very communicative.   Lab Results:  Basic Metabolic Panel:  Recent Labs Lab 04/22/15 0550 04/24/15 0520  NA  --  139  K  --  3.5  CL  --  110  CO2  --  21*  GLUCOSE  --  124*  BUN  --  <5*  CREATININE 0.66 0.57  CALCIUM  --  8.2*   Liver Function Tests: No results for input(s): AST, ALT, ALKPHOS, BILITOT, PROT, ALBUMIN in the last 168 hours.  Recent Labs Lab 04/18/15 1317  AMMONIA 46*   CBC:  Recent Labs Lab 04/20/15 0442 04/21/15 0639 04/22/15 0550 04/23/15 0655 04/24/15 0520  WBC 7.0 5.8 6.5 8.5 9.2  HGB 10.3* 10.1* 10.1* 9.9* 9.5*  HCT 31.7* 30.4* 30.9* 29.6* 29.1*  MCV 90.1 90.2 90.9 90.0 88.4  PLT 627* 773* 930* 1162* 1021*    CBG:  Recent Labs Lab 04/23/15 0810 04/23/15 1208  04/23/15 1653 04/24/15 0017 04/24/15 0606  GLUCAP 109* 135* 122* 130* 125*    Recent Results (from the past 240 hour(s))  Culture, blood (routine x 2)     Status: None   Collection Time: 04/19/15  1:00 PM  Result Value Ref Range Status   Specimen Description BLOOD BLOOD RIGHT ARM  Final   Special Requests BOTTLES DRAWN AEROBIC AND ANAEROBIC 5CC  Final   Culture  Setup Time   Final    GRAM NEGATIVE RODS IN BOTH AEROBIC AND ANAEROBIC BOTTLES CRITICAL RESULT CALLED TO, READ BACK BY AND VERIFIED WITH: TO KGENGLER(RN) BY TCLEVELAND 02/18/2015 AT 2:54AM    Culture PROVIDENCIA STUARTII  Final   Report Status 04/22/2015 FINAL  Final   Organism ID, Bacteria PROVIDENCIA STUARTII  Final      Susceptibility   Providencia  stuartii - MIC*    AMPICILLIN >=32 RESISTANT Resistant     CEFAZOLIN >=64 RESISTANT Resistant     CEFEPIME <=1 SENSITIVE Sensitive     CEFTAZIDIME 16 INTERMEDIATE Intermediate     CEFTRIAXONE <=1 SENSITIVE Sensitive     CIPROFLOXACIN 2 INTERMEDIATE Intermediate     GENTAMICIN 4 RESISTANT Resistant     IMIPENEM 2 SENSITIVE Sensitive     TRIMETH/SULFA >=320 RESISTANT Resistant     AMPICILLIN/SULBACTAM 16 INTERMEDIATE Intermediate     PIP/TAZO <=4 SENSITIVE Sensitive     * PROVIDENCIA STUARTII  Culture, blood (routine x 2)     Status: None   Collection Time: 04/19/15  1:00 PM  Result Value Ref Range Status   Specimen Description BLOOD BLOOD RIGHT ARM  Final   Special Requests BOTTLES DRAWN AEROBIC AND ANAEROBIC 5CC  Final   Culture  Setup Time   Final    GRAM NEGATIVE RODS IN BOTH AEROBIC AND ANAEROBIC BOTTLES CRITICAL RESULT CALLED TO, READ BACK BY AND VERIFIED WITH: TO KGENGLER(RN) BY TCLEVELAND 04/20/2015 AT 2:56AM    Culture   Final    PROVIDENCIA STUARTII SUSCEPTIBILITIES PERFORMED ON PREVIOUS CULTURE WITHIN THE LAST 5 DAYS.    Report Status 04/22/2015 FINAL  Final  Culture, blood (routine x 2)     Status: None (Preliminary result)   Collection Time: 04/22/15 11:35 AM  Result Value Ref Range Status   Specimen Description BLOOD RIGHT HAND  Final   Special Requests BOTTLES DRAWN AEROBIC ONLY 10CC  Final   Culture NO GROWTH 1 DAY  Final   Report Status PENDING  Incomplete  Culture, blood (routine x 2)     Status: None (Preliminary result)   Collection Time: 04/22/15 11:44 AM  Result Value Ref Range Status   Specimen Description BLOOD LEFT HAND  Final   Special Requests BOTTLES DRAWN AEROBIC ONLY 3CC  Final   Culture NO GROWTH 1 DAY  Final   Report Status PENDING  Incomplete      Studies/Results: No results found.  Medications:  Scheduled: . acetaminophen  650 mg Oral TID  . antiseptic oral rinse  7 mL Mouth Rinse BID  . apixaban  2.5 mg Oral BID  . atorvastatin  10  mg Oral q1800  . calcium-vitamin D  1 tablet Oral BID  . cefTRIAXone (ROCEPHIN)  IV  2 g Intravenous Q24H  . ferrous sulfate  325 mg Oral BID WC  . hydroxyurea  1,000 mg Oral Q breakfast  . levothyroxine  100 mcg Intravenous Daily  . multivitamin  5 mL Oral Daily  . pantoprazole (PROTONIX) IV  40 mg Intravenous Q24H  . senna-docusate  1 tablet Oral QPM  . sodium chloride  1 spray Each Nare Daily   Continuous: . dextrose 5 % and 0.45 % NaCl with KCl 20 mEq/L 75 mL/hr at 04/24/15 0602   NOB:[id number], labetalol, methocarbamol, ondansetron **OR** ondansetron (ZOFRAN) IV  Assessment/Plan:  Principal Problem:   Ischemia of foot Active Problems:   Essential thrombocytosis   Hypothyroidism   Dyslipidemia   Paroxysmal atrial fibrillation   Advanced dementia   Dry gangrene   Goals of care, counseling/discussion   Encounter for palliative care   Rash and nonspecific skin eruption    Ischemic right foot Seen by vascular surgery. Conservative approach as patient is not a candidate for surgery last seen by Dr. early on 8/16. Requesting that the patient be seen again by vascular surgery. Patient also being followed by palliative care. I have   recommended daughter to consider a [employer] has a final disposition given poor prognosis as we have exhausted all treatment options at this time  Gram-negative Bacteremia/sepsis , likely from ischemic foot Final culture reports are available. Providencia was isolated. Narrow antibiotics spectrum. Source is likely her ischemic foot. Spoke with the daughter and told her that she will be at risk of recurrent bacteremia. She will need another 5 days of Rocephin, we can administer Rocephin IM if the patient is discharged. Most likely she will keep getting bacteremic because of her limb ischemia.  Acute encephalopathy Evaluation revealed increased ammonia level. TSH was elevated. However her free T4 was noted to be elevated. She was also started on  lactulose. Mental status did not change appreciably. However, with initiation of antibiotics it appeared that there was an improvement. It is unclear if the improvement was significant or not. Patient remains poorly responsive.  History of hypothyroidism Currently on intravenous levothyroxin. Can be changed over to oral once she is taking orally.  History of Atrial fibrillation Heart rate is controlled. Change to oral Eliquis.  Essential Hypertension Monitor blood pressures closely. Well controlled off antihypertensive regimen.  Thrombocytosis Continue hydroxyurea once patient able to tolerate oral regimen.  H/o dementia Mental status is worse compared to her usual baseline. According to reports.   Goals of care Patient seen by palliative medicine. She doesn't appear to be making much improvement. Will need to have a repeat conversation with family regarding goals of care if there is no change in the next 24-48 hours.  DVT Prophylaxis: Eliquis  Code Status: DO NOT RESUSCITATE  Family Communication: No family at bedside  Disposition Plan:   recommend  hospice home placement, prognosis poor    LOS: 12 days   [facility] Pager [phone]  04/24/2015, 12:00 PM  If 7PM-7AM, please contact night-coverage at [email or web address], password [facility]

## 2015-04-24 NOTE — Progress Notes (Signed)
Patient ID: Tanya Farmer, female   DOB: May 10, 1926, 81 y.o.   MRN: [id number] Comfortable.  Does not respond.  Demarcation of ischemic toes and foot. Knee contracted.  No sign of infection in ischemic foot. Agree with plan for palliative care and support.

## 2015-04-24 NOTE — Progress Notes (Signed)
Nutrition Follow-up  DOCUMENTATION CODES:   Not applicable  INTERVENTION:  Continue Magic cup TID with meals, each supplement provides 290 kcal and 9 grams of protein.  Provide Ensure Enlive po TID, each supplement provides 350 kcal and 20 grams of protein.  Encourage PO intake.   If family desires TF and feeding tube placed, recommend: Osmolite 1.2 @ 20 ml/hr and increase by 10 ml every 8 hours to goal rate of 50 ml/hr. Provides: 1440 kcal, 66 grams protein, and 984 ml H2O  NUTRITION DIAGNOSIS:   Inadequate oral intake related to lethargy/confusion as evidenced by meal completion < 50%; ongoing  GOAL:   Patient will meet greater than or equal to 90% of their needs; not met  MONITOR:   PO intake  REASON FOR ASSESSMENT:   Consult Enteral/tube feeding initiation and management  ASSESSMENT:   81 y.o. female with history of atrial fibrillation on the Apixaban, chronic kidney disease stage III, hypertension, hypothyroidism and advanced dementia was brought to the ER after patient was found to have darkening of her right foot fifth toe. As per the family patient had not been doing well last few days and has been noticed to have moving her right lower extremity more than normal. Patient's family initially noticed some redness of the toe. Patient is not very communicative because of her dementia. Patient was brought to the ER with concerns of acute ischemic limb and vascular surgeon Dr. Oneida Alar has seen and evaluated the patient and at this time feels patient is best treated conservatively given her comorbidities.   Pt lethargic and poorly responsive. No family present during time of visit. Palliative continues to follow. Per MD note, pt does not appear to be making improvement, thus will need repeat discussion for goals of care in the next 24-48 hours. Meal completion has been 0-20%. Discussed with RN regarding need for additional nutrition supplements as pt with ongoing poor po. RD  to order Ensure to aid in caloric and protein needs.   During previous visit, family reports they do not want any feeding tube for pt. TF recommended stated above in case new decision are made and family desires for TF.   Labs and medications reviewed.   Diet Order:  DIET - DYS 1 Room service appropriate?: Yes; Fluid consistency:: Thin  Skin:  Wound (see comment) (ischemic right foot)  Last BM:  8/20  Height:   Ht Readings from Last 1 Encounters:  04/17/15 5' 3" (1.6 m)    Weight:   Wt Readings from Last 1 Encounters:  04/23/15 128 lb 4.9 oz (58.2 kg)    Ideal Body Weight:  52.2 kg  BMI:  Body mass index is 22.73 kg/(m^2).  Estimated Nutritional Needs:   Kcal:  1300-1500  Protein:  65-75 grams  Fluid:  > 1.5 L/day  EDUCATION NEEDS:   No education needs identified at this time  Corrin Parker, MS, RD, LDN Pager # [phone] After hours/ weekend pager # [phone]

## 2015-04-25 DIAGNOSIS — I96 Gangrene, not elsewhere classified: Secondary | ICD-10-CM

## 2015-04-25 LAB — COMPREHENSIVE METABOLIC PANEL
ALK PHOS: 91 U/L (ref 38–126)
ALT: 71 U/L — AB (ref 14–54)
ANION GAP: 7 (ref 5–15)
AST: 85 U/L — ABNORMAL HIGH (ref 15–41)
Albumin: 1.7 g/dL — ABNORMAL LOW (ref 3.5–5.0)
BILIRUBIN TOTAL: 0.2 mg/dL — AB (ref 0.3–1.2)
BUN: <5 mg/dL — ABNORMAL LOW (ref 6–20)
CALCIUM: 8 mg/dL — AB (ref 8.9–10.3)
CO2: 23 mmol/L (ref 22–32)
CREATININE: 0.61 mg/dL (ref 0.44–1.00)
Chloride: 107 mmol/L (ref 101–111)
Glucose, Bld: 129 mg/dL — ABNORMAL HIGH (ref 65–99)
Potassium: 3.5 mmol/L (ref 3.5–5.1)
Sodium: 137 mmol/L (ref 135–145)
TOTAL PROTEIN: 5.6 g/dL — AB (ref 6.5–8.1)

## 2015-04-25 LAB — GLUCOSE, CAPILLARY
GLUCOSE-CAPILLARY: 109 mg/dL — AB (ref 65–99)
GLUCOSE-CAPILLARY: 140 mg/dL — AB (ref 65–99)
Glucose-Capillary: 125 mg/dL — ABNORMAL HIGH (ref 65–99)

## 2015-04-25 LAB — CBC
HEMATOCRIT: 28.1 % — AB (ref 36.0–46.0)
Hemoglobin: 9.1 g/dL — ABNORMAL LOW (ref 12.0–15.0)
MCH: 29.5 pg (ref 26.0–34.0)
MCHC: 32.4 g/dL (ref 30.0–36.0)
MCV: 91.2 fL (ref 78.0–100.0)
Platelets: 1036 10*3/uL (ref 150–400)
RBC: 3.08 MIL/uL — ABNORMAL LOW (ref 3.87–5.11)
RDW: 20.3 % — AB (ref 11.5–15.5)
WBC: 8 10*3/uL (ref 4.0–10.5)

## 2015-04-25 NOTE — Progress Notes (Signed)
ANTIBIOTIC CONSULT NOTE - Follow Up Consult  Pharmacy Consult for Rocephin  Indication: ischemic R-foot & Providencia Stuartii Bacteremia  No Known Allergies  Patient Measurements: Height: 5\' 3"  (160 cm) Weight: 129 lb 6.6 oz (58.7 kg) IBW/kg (Calculated) : 52.4  Vital Signs: Temp: 98.3 F (36.8 C) (08/27 0747) Temp Source: Oral (08/27 0747) BP: 115/77 mmHg (08/27 0747) Pulse Rate: 99 (08/27 0747)  Labs:  Recent Labs  04/23/15 0655 04/24/15 0520 04/25/15 0510  HGB 9.9* 9.5* 9.1*  HCT 29.6* 29.1* 28.1*  PLT 1162* 1021* 1036*  HEPARINUNFRC 0.38  --   --   CREATININE  --  0.57 0.61    Estimated Creatinine Clearance: 41 mL/min (by C-G formula based on Cr of 0.61).  Assessment: 89 YOF on Apixaban PTA for hx Afib and ischemic R-foot - now transitioned to IV Heparin given poor oral intake while awaiting palliative plans. The patient's IV site infiltrated prior to AM labs on 8/22 - it has since been resumed and remains in the therapeutic range (HL 0.48 << 0.36, goal of 0.3-0.7). CBC stable - no overt signs/symptoms of bleeding noted at this time.   The patient is also to narrow from Zosyn to Rocephin this morning for continued treatment of Providencia Stuartii bacteremia. Today is D#6 of treatment - usual recommended duration is 10-14 days. Repeat blood cultures have been ordered to check for clearance.   Pt remains afebrile, WBC wnl, sCr 0.61. Repeat blood cultures from 8/24 have had NGTD.  Goal of Therapy:  Proper antibiotics for infection/cultures adjusted for renal/hepatic function    Plan:  Continue ceftriaxone 2g IV every 24 hours Will continue to follow renal function and LOT plans  Andrey Cota. Diona Foley, PharmD Clinical Pharmacist Pager [phone]  04/25/2015 10:25 AM

## 2015-04-25 NOTE — Care Management Note (Signed)
Case Management Note  Patient Details  Name: Tanya Farmer MRN: [id number] Date of Birth: 1926/06/23  Subjective/Objective:                  81 y.o. female with history of atrial fibrillation on the Crixivan, chronic kidney disease stage III, hypertension, hypothyroidism and advanced dementia was brought to the ER after patient was found to have darkening of her right foot fifth toe.    Action/Plan:  [place] Expected Discharge Date:                  Expected Discharge Plan:  [place]  In-House Referral:  Clinical Social Work  Discharge planning Services     Post Acute Care Choice:  Hospice Choice offered to:     DME Arranged:    DME Agency:     HH Arranged:    [place] Agency:     Status of Service:  In process, will continue to follow  Medicare Important Message Given:  Yes-fourth notification given Date Medicare IM Given:    Medicare IM give by:    Date Additional Medicare IM Given:    Additional Medicare Important Message give by:     If discussed at [place] of Stay Meetings, dates discussed:    Additional Comments: CM spoke reviewed chart and spoke with Dr. Clementeen Hoof. Regarding Possible Palliative and Hospice care. Will have daughte (Mrs. Tanya Farmer) meeting today at 12 to discuss goals of care. CM will follow up for disposition plan.  Laurena Slimmer, RN 04/25/2015, 10:56 AM

## 2015-04-25 NOTE — Progress Notes (Signed)
family meeting:  Met with the patient's daughter Aremental, patient's son-in-law and several other family members. Reviewed patient's clinical history. Discussed patient's current medical condition. Asked the patient's daughter about her understanding of the patient's current medical condition based on her recent conversations with Dr. early as well as with hospital physicians.  The patient's daughter is eager to continue with the patient's nutrition. She is feeding the patient soft comfort foods. She states that the patient has almost a near 100% oral intake when she is feeding her. The patient's daughter continues to struggle with the patient's ongoing decline as well as her overall state of health and her life limiting illness of vascular ischemia. Various discussions held about need/use of further antibiotics, need/use of further nutrition. Patient's daughter had a question about Dobbhoff tube and artificial nutrition. Discussed with the patient's daughter that would not advise Dobbhoff tube at this time. Encouraged patient's daughter to continue with comfort feeding and to promote the patient's sense of well-being and to promote and continue with the act of feeding and oral intake for the patient no matter how minimal.  Patient's son-in-law talk with me outside the room for a long time. He stated that the patient's daughter is struggling with her decision-making. At this time, two disposition options are being considered:  #1 transfer to [facility] place. Extensive discussions held with the patient's daughter about what that would entail. Discussed about the type of care that can be provided at [place] measures only. #2. Discussed about transfer back to [place] facility approach.   The patient's daughter states that she does not want to be rushed into a discharge. She wishes to explore [facility] place as well as several other surrounding hospice facilities before she makes  a final decision. Alternatively, she is also not sure if the patient or to simply go back to [place] [facility] place towards the end of this hospitalization.  All questions answered to the best of my ability. Offered supportive and active listening. We will continue to follow along and help guide decision-making.  35 minutes spent. Loistine Chance MD [phone]  [place] palliative medicine team

## 2015-04-25 NOTE — Progress Notes (Signed)
[facility] PROGRESS NOTE  Tanya Farmer KGU:[id number] DOB: 10-May-1926 DOA: 04/12/2015  PCP: Blanchie Serve, MD  Brief HPI: 81 year old African-American female with a past medical history of dementia, atrial fibrillation, hypertension, presented with ischemic right foot and altered mental status. She was seen by vascular surgery who recommended conservative approach. She subsequently was noted to have bacteremia. Palliative medicine is also following. Waiting for some improvement. If she doesn't improve, further discussions to be held with family.  Past medical history:  Past Medical History  Diagnosis Date  . Hypertension   . Hyperlipidemia   . Thyroid disease     Hypothyroidism  . GERD (gastroesophageal reflux disease)   . Arthritis     Degenerative  . Peripheral vascular disease     Left   Fem-Pop and  tibial Thrombectomy  . High platelet count   . Dementia   . Pain in toe of right foot   . Hypothyroidism   . Long term (current) use of anticoagulants 03/13/2014  . Neuromuscular disorder   . Parkinson's disease     Consultants: Vascular surgery, palliative medicine  Procedures: None  Antibiotics: Zosyn, changed over to ceftriaxone 8/24  Subjective: patient is resting in chair, she opens eyes, does not respond  Objective: Vital Signs  Filed Vitals:   04/24/15 1758 04/24/15 2109 04/25/15 0445 04/25/15 0747  BP: 118/70 138/97 111/71 115/77  Pulse: 98 127 95 99  Temp: 98.5 F (36.9 C) 99.2 F (37.3 C) 98.3 F (36.8 C) 98.3 F (36.8 C)  TempSrc: Oral Oral Oral Oral  Resp: 22 14 16 18   Height:      Weight:  58.7 kg (129 lb 6.6 oz)    SpO2: 100% 99% 100% 99%    Intake/Output Summary (Last 24 hours) at 04/25/15 1240 Last data filed at 04/25/15 1000  Gross per 24 hour  Intake 2429.5 ml  Output      0 ml  Net 2429.5 ml   Filed Weights   04/22/15 2137 04/23/15 2154 04/24/15 2109  Weight: 57.5 kg (126 lb 12.2 oz) 58.2 kg (128 lb 4.9 oz) 58.7 kg (129 lb  6.6 oz)    General appearance: Somnolent. In no distress. Resp: Diminished air entry at the bases. No crackles or wheezing. Cardio: regular rate and rhythm, S1, S2 normal, no murmur, click, rub or gallop GI: soft, non-tender; bowel sounds normal; no masses,  no organomegaly Extremities: Right foot is cold to touch. Black discoloration. Neurologic: Not very communicative.   Lab Results:  Basic Metabolic Panel:  Recent Labs Lab 04/22/15 0550 04/24/15 0520 04/25/15 0510  NA  --  139 137  K  --  3.5 3.5  CL  --  110 107  CO2  --  21* 23  GLUCOSE  --  124* 129*  BUN  --  <5* <5*  CREATININE 0.66 0.57 0.61  CALCIUM  --  8.2* 8.0*   Liver Function Tests:  Recent Labs Lab 04/25/15 0510  AST 85*  ALT 71*  ALKPHOS 91  BILITOT 0.2*  PROT 5.6*  ALBUMIN 1.7*    Recent Labs Lab 04/18/15 1317  AMMONIA 46*   CBC:  Recent Labs Lab 04/21/15 0639 04/22/15 0550 04/23/15 0655 04/24/15 0520 04/25/15 0510  WBC 5.8 6.5 8.5 9.2 8.0  HGB 10.1* 10.1* 9.9* 9.5* 9.1*  HCT 30.4* 30.9* 29.6* 29.1* 28.1*  MCV 90.2 90.9 90.0 88.4 91.2  PLT 773* 930* 1162* 1021* 1036*    CBG:  Recent Labs Lab 04/24/15  1157 04/24/15 1706 04/24/15 2349 04/25/15 0656 04/25/15 1136  GLUCAP 126* 116* 141* 125* 109*    Recent Results (from the past 240 hour(s))  Culture, blood (routine x 2)     Status: None   Collection Time: 04/19/15  1:00 PM  Result Value Ref Range Status   Specimen Description BLOOD BLOOD RIGHT ARM  Final   Special Requests BOTTLES DRAWN AEROBIC AND ANAEROBIC 5CC  Final   Culture  Setup Time   Final    GRAM NEGATIVE RODS IN BOTH AEROBIC AND ANAEROBIC BOTTLES CRITICAL RESULT CALLED TO, READ BACK BY AND VERIFIED WITH: TO KGENGLER(RN) BY TCLEVELAND 02/18/2015 AT 2:54AM    Culture PROVIDENCIA STUARTII  Final   Report Status 04/22/2015 FINAL  Final   Organism ID, Bacteria PROVIDENCIA STUARTII  Final      Susceptibility   Providencia stuartii - MIC*    AMPICILLIN >=32  RESISTANT Resistant     CEFAZOLIN >=64 RESISTANT Resistant     CEFEPIME <=1 SENSITIVE Sensitive     CEFTAZIDIME 16 INTERMEDIATE Intermediate     CEFTRIAXONE <=1 SENSITIVE Sensitive     CIPROFLOXACIN 2 INTERMEDIATE Intermediate     GENTAMICIN 4 RESISTANT Resistant     IMIPENEM 2 SENSITIVE Sensitive     TRIMETH/SULFA >=320 RESISTANT Resistant     AMPICILLIN/SULBACTAM 16 INTERMEDIATE Intermediate     PIP/TAZO <=4 SENSITIVE Sensitive     * PROVIDENCIA STUARTII  Culture, blood (routine x 2)     Status: None   Collection Time: 04/19/15  1:00 PM  Result Value Ref Range Status   Specimen Description BLOOD BLOOD RIGHT ARM  Final   Special Requests BOTTLES DRAWN AEROBIC AND ANAEROBIC 5CC  Final   Culture  Setup Time   Final    GRAM NEGATIVE RODS IN BOTH AEROBIC AND ANAEROBIC BOTTLES CRITICAL RESULT CALLED TO, READ BACK BY AND VERIFIED WITH: TO KGENGLER(RN) BY TCLEVELAND 04/20/2015 AT 2:56AM    Culture   Final    PROVIDENCIA STUARTII SUSCEPTIBILITIES PERFORMED ON PREVIOUS CULTURE WITHIN THE LAST 5 DAYS.    Report Status 04/22/2015 FINAL  Final  Culture, blood (routine x 2)     Status: None (Preliminary result)   Collection Time: 04/22/15 11:35 AM  Result Value Ref Range Status   Specimen Description BLOOD RIGHT HAND  Final   Special Requests BOTTLES DRAWN AEROBIC ONLY 10CC  Final   Culture NO GROWTH 2 DAYS  Final   Report Status PENDING  Incomplete  Culture, blood (routine x 2)     Status: None (Preliminary result)   Collection Time: 04/22/15 11:44 AM  Result Value Ref Range Status   Specimen Description BLOOD LEFT HAND  Final   Special Requests BOTTLES DRAWN AEROBIC ONLY 3CC  Final   Culture NO GROWTH 2 DAYS  Final   Report Status PENDING  Incomplete      Studies/Results: No results found.  Medications:  Scheduled: . acetaminophen  650 mg Oral TID  . antiseptic oral rinse  7 mL Mouth Rinse BID  . apixaban  2.5 mg Oral BID  . atorvastatin  10 mg Oral q1800  . calcium-vitamin D   1 tablet Oral BID  . cefTRIAXone (ROCEPHIN)  IV  2 g Intravenous Q24H  . feeding supplement (ENSURE ENLIVE)  237 mL Oral TID BM  . ferrous sulfate  325 mg Oral BID WC  . hydroxyurea  1,000 mg Oral Q breakfast  . levothyroxine  100 mcg Intravenous Daily  . multivitamin  5 mL  Oral Daily  . pantoprazole (PROTONIX) IV  40 mg Intravenous Q24H  . senna-docusate  1 tablet Oral QPM  . sodium chloride  1 spray Each Nare Daily   Continuous: . dextrose 5 % and 0.45 % NaCl with KCl 20 mEq/L 75 mL/hr at 04/24/15 2036   [id number], labetalol, methocarbamol, ondansetron **OR** ondansetron (ZOFRAN) IV  Assessment/Plan:  Principal Problem:   Ischemia of foot Active Problems:   Essential thrombocytosis   Hypothyroidism   Dyslipidemia   Paroxysmal atrial fibrillation   Advanced dementia   Dry gangrene   Goals of care, counseling/discussion   Encounter for palliative care   Rash and nonspecific skin eruption    Ischemic right foot Seen by vascular surgery. Conservative approach as patient is not a candidate for surgery last seen by Dr. early on 8/16. According to Rosetta Posner, MD , there is no surgical intervention planned and he recommends a palliative approach. Patient also being followed by palliative care. I have   recommended daughter to consider a [employer] has a final disposition given poor prognosis as we have exhausted all treatment options at this time  Gram-negative Bacteremia/sepsis , likely from ischemic foot Final culture reports are available. Providencia was isolated. Narrow antibiotics spectrum. Source is likely her ischemic foot. Spoke with the daughter and told her that she will be at risk of recurrent bacteremia. She will need another4 days of Rocephin, we can administer Rocephin IM if the patient is discharged. Most likely she will keep getting bacteremic because of her limb ischemia.  Acute encephalopathy Evaluation revealed increased ammonia level. TSH was  elevated. However her free T4 was noted to be elevated. She was also started on lactulose. Mental status did not change appreciably. However, with initiation of antibiotics it appeared that there was an improvement. It is unclear if the improvement was significant or not. Patient remains poorly responsive. Palliative care recommending transfer to inpatient hospice or hospice home.   History of hypothyroidism Currently on intravenous levothyroxin. Can be changed over to oral once she is taking orally.  History of Atrial fibrillation Heart rate is controlled. Change to oral Eliquis.  Essential Hypertension Monitor blood pressures closely. Well controlled off antihypertensive regimen.  Thrombocytosis Continue hydroxyurea once patient able to tolerate oral regimen.  H/o dementia Mental status is worse compared to her usual baseline. According to reports.   Goals of care Patient seen by palliative medicine. She doesn't appear to be making much improvement. Final disposition pending palliative care meeting today  DVT Prophylaxis: Eliquis  Code Status: DO NOT RESUSCITATE  Family Communication: No family at bedside  Disposition Plan:   recommend  hospice home placement, prognosis poor    LOS: 13 days   [place] [facility]  [facility] Pager [phone]  04/25/2015, 12:40 PM  If 7PM-7AM, please contact night-coverage at [email or web address], password [facility]

## 2015-04-25 NOTE — Progress Notes (Signed)
Daily Progress Note   Patient Name: Tanya Farmer       Date: 04/25/2015 DOB: 10-Jul-1926  Age: 81 y.o. MRN#: [id number] Attending Physician: Reyne Dumas, MD Primary Care Physician: Blanchie Serve, MD Admit Date: 04/12/2015  Life limiting illness: 81 year old African-American female with a past medical history of dementia, atrial fibrillation, hypertension, presented with ischemic right foot and altered mental status. She was seen by vascular surgery who recommended conservative approach. She subsequently was noted to have bacteremia.   Reason for Consultation/Follow-up: Establishing goals of care  Subjective:  patient is resting in chair, she opens eyes, does not respond  Interval Events: Appreciate Dr Luther Parody assessment and recommendations Call placed and discussed with daughter Lance Sell Also discussed with weekend case manager on the floor  PLAN: Family meeting to address the patient's current condition and to figure out safe disposition scheduled for 04-25-15 as soon as the patient's daughter arrives. Daughter will call me when she arrives in the patient's room.  Today's discussion will also include addition of hospice consult and to assess whether transfer to inpatient hospice at the time of discharge is considered by all to be a reasonable option.  Additional recommendations will follow.    Length of Stay: 13 days  Current Medications: Scheduled Meds:  . acetaminophen  650 mg Oral TID  . antiseptic oral rinse  7 mL Mouth Rinse BID  . apixaban  2.5 mg Oral BID  . atorvastatin  10 mg Oral q1800  . calcium-vitamin D  1 tablet Oral BID  . cefTRIAXone (ROCEPHIN)  IV  2 g Intravenous Q24H  . feeding supplement (ENSURE ENLIVE)  237 mL Oral TID BM  . ferrous sulfate  325 mg Oral BID WC  . hydroxyurea  1,000 mg Oral Q breakfast  . levothyroxine  100 mcg Intravenous Daily  . multivitamin  5 mL Oral Daily  . pantoprazole (PROTONIX) IV  40 mg Intravenous Q24H  .  senna-docusate  1 tablet Oral QPM  . sodium chloride  1 spray Each Nare Daily    Continuous Infusions: . dextrose 5 % and 0.45 % NaCl with KCl 20 mEq/L 75 mL/hr at 04/24/15 2036    PRN Meds: guaiFENesin, labetalol, methocarbamol, ondansetron **OR** ondansetron (ZOFRAN) IV  Palliative Performance Scale: 20%     Vital Signs: BP 115/77 mmHg  Pulse 99  Temp(Src) 98.3 F (36.8 C) (Oral)  Resp 18  Ht 5\' 3"  (1.6 m)  Wt 58.7 kg (129 lb 6.6 oz)  BMI 22.93 kg/m2  SpO2 99% SpO2: SpO2: 99 % O2 Device: O2 Device: Not Delivered O2 Flow Rate:    Intake/output summary:  Intake/Output Summary (Last 24 hours) at 04/25/15 1051 Last data filed at 04/25/15 0903  Gross per 24 hour  Intake 2129.5 ml  Output      0 ml  Net 2129.5 ml   LBM:   Baseline Weight: Weight: 51 kg (112 lb 7 oz) Most recent weight: Weight: 58.7 kg (129 lb 6.6 oz)  Physical Exam: Weak frail RLE cool to touch gangrenous in appearance Clear anterior S 1 S 2       Additional Data Reviewed: Recent Labs     04/24/15  0520  04/25/15  0510  WBC  9.2  8.0  HGB  9.5*  9.1*  PLT  1021*  1036*  NA  139  137  BUN  <5*  <5*  CREATININE  0.57  0.61     Problem List:  Patient Active Problem List  Diagnosis Date Noted  . Rash and nonspecific skin eruption   . Goals of care, counseling/discussion   . Encounter for palliative care   . 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 Ischemia of foot 04/12/2015  . Advanced dementia 04/01/2015  . Palliative care encounter 09/08/2014  . Decreased appetite 09/08/2014  . Thrombocythemia 09/03/2014  . Acute lower GI bleeding 08/31/2014  . GI bleed 08/31/2014  . Cerumen impaction 07/14/2014  . Paroxysmal atrial fibrillation 06/26/2014  . 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 Dementia with Parkinsonism 06/26/2014  . Recurrent UTI 05/19/2014  . Insomnia 12/18/2013  . Constipation 12/18/2013  . Dementia without behavioral disturbance 09/23/2013  . GERD  (gastroesophageal reflux disease) 09/23/2013  . Dyslipidemia 09/23/2013  . Weakness generalized 09/23/2013  . UTI (lower urinary tract infection) 09/16/2013  . FTT (failure to thrive) in adult 09/16/2013  . Facial erythema 09/16/2013  . Chronic anticoagulation 09/16/2013  . Essential hypertension, benign 07/06/2013  . Abnormal EKG 07/05/2013  . Essential thrombocytosis 07/05/2013  . Hypothyroidism 07/05/2013  . Peripheral vascular disease, unspecified 05/08/2012     Palliative Care Assessment & Plan    Code Status:  DNR  Goals of Care:   to be determined further   Desire for further Chaplaincy support:no  3. Symptom Management:   continue current measures, will address further in family meeting scheduled for later today.   4. Palliative Prophylaxis:  Stool Softener: yes  5. Prognosis: < 2 weeks  5. Discharge Planning: pending further discussions.    Care plan was discussed with  Patient, care manager, patient's daughter over the phone, she is on her way to the hospital.   Thank you for allowing the Palliative Medicine Team to assist in the care of this patient.   Time In: 10:00AM Time Out: 10:35 AM Total Time 35 min Prolonged Time Billed  no     Greater than 50%  of this time was spent counseling and coordinating care related to the above assessment and plan.  Ahan Eisenberger Burt Knack, MD  04/25/2015, 10:51 AM  [phone]  Please contact Palliative Medicine Team phone at [phone] for questions and concerns.

## 2015-04-25 NOTE — Progress Notes (Signed)
Patient ID: Tanya Farmer, female   DOB: 01/24/1926, 81 y.o.   MRN: [id number] I again spoke with the patient's daughter and her son-in-law by telephone today. She wants to do what is best with her mother. She did have some confusion regarding my feeling about her ability to walk. I clarified to her that I thought that there was no chance for limb salvage and that she would not be able to walk. Explain only option would be above-knee amputation as before which reserve this for progressive tissue loss or pain. She was questioning my opinion as far as whether she was in pain. Explained that with my evaluation yesterday, she did not have any apparent tenderness to palpation of her calf or foot. She does have minimal communication but does not appear to be in any severe pain. I explained that in my opinion her last option would be palliative care. I did explain that this was an end-of-life event for [name]. Certainly would recommend against amputation since I suspect that she has a very short life expectancy. Will not follow actively. Please contact me if we can provide any further assistance

## 2015-04-25 NOTE — Progress Notes (Signed)
04/25/2015 2:02 PM  Assisted in helping patient family understanding the progression of the patient illness.  MD Abrol was at bedside informing the patient. MD Early was called on the phone (via speaker phone) also informing the patient family members about the course of treatment that could be expected for the patient. Daughter and daughter's husband did verbalize a better understanding of the patients End of Life treatment that was being recommended for the patient.   After discussion concluded Chaplin was present at the bedside.   Informed MD Anwef (palliative care) that the family was still at bedside and was requesting to speak to MD Domingo Cocking who is in the palliative care service. Informed the patients that MD Domingo Cocking would not be back on call until Wednesday. Will continue to assess and monitor the patient.   [employer], RN-BC, [employer] [facility] Phone [phone]

## 2015-04-26 LAB — GLUCOSE, CAPILLARY
GLUCOSE-CAPILLARY: 109 mg/dL — AB (ref 65–99)
GLUCOSE-CAPILLARY: 109 mg/dL — AB (ref 65–99)
GLUCOSE-CAPILLARY: 124 mg/dL — AB (ref 65–99)
GLUCOSE-CAPILLARY: 130 mg/dL — AB (ref 65–99)

## 2015-04-26 MED ORDER — LEVOTHYROXINE SODIUM 200 MCG PO TABS
200.0000 ug | ORAL_TABLET | Freq: Every day | ORAL | Status: DC
  Administered 2015-04-28 – 2015-04-29 (×2): 200 ug via ORAL
  Filled 2015-04-26 (×3): qty 1

## 2015-04-26 NOTE — Progress Notes (Signed)
[facility] PROGRESS NOTE  KATHERLEEN FOLKES YKD:[id number] DOB: 07-27-1926 DOA: 04/12/2015  PCP: Blanchie Serve, MD  Brief HPI: 81 year old African-American female with a past medical history of dementia, atrial fibrillation, hypertension, presented with ischemic right foot and altered mental status. She was seen by vascular surgery who recommended conservative approach. She subsequently was noted to have bacteremia. Palliative medicine is also following. Waiting for some improvement. If she doesn't improve, further discussions to be held with family.  Past medical history:  Past Medical History  Diagnosis Date  . Hypertension   . Hyperlipidemia   . Thyroid disease     Hypothyroidism  . GERD (gastroesophageal reflux disease)   . Arthritis     Degenerative  . Peripheral vascular disease     Left   Fem-Pop and  tibial Thrombectomy  . High platelet count   . Dementia   . Pain in toe of right foot   . Hypothyroidism   . Long term (current) use of anticoagulants 03/13/2014  . Neuromuscular disorder   . Parkinson's disease     Consultants: Vascular surgery, palliative medicine  Procedures: None  Antibiotics: Zosyn, changed over to ceftriaxone 8/24  Subjective: patient is resting in chair, she opens eyes, does not respond  Objective: Vital Signs  Filed Vitals:   04/25/15 1625 04/25/15 2010 04/26/15 0421 04/26/15 0754  BP: 136/78 145/90 128/94 134/82  Pulse: 102 128 104 68  Temp: 98.2 F (36.8 C) 97.4 F (36.3 C) 98.4 F (36.9 C) 98.2 F (36.8 C)  TempSrc: Oral Axillary Oral Oral  Resp: 17 20 19 18   Height:      Weight:  57.7 kg (127 lb 3.3 oz)    SpO2: 95% 100% 100% 100%    Intake/Output Summary (Last 24 hours) at 04/26/15 1021 Last data filed at 04/26/15 0921  Gross per 24 hour  Intake    722 ml  Output      0 ml  Net    722 ml   Filed Weights   04/23/15 2154 04/24/15 2109 04/25/15 2010  Weight: 58.2 kg (128 lb 4.9 oz) 58.7 kg (129 lb 6.6 oz) 57.7 kg  (127 lb 3.3 oz)    General appearance: Somnolent. In no distress. Resp: Diminished air entry at the bases. No crackles or wheezing. Cardio: regular rate and rhythm, S1, S2 normal, no murmur, click, rub or gallop GI: soft, non-tender; bowel sounds normal; no masses,  no organomegaly Extremities: Right foot is cold to touch. Black discoloration. Neurologic: Not very communicative.   Lab Results:  Basic Metabolic Panel:  Recent Labs Lab 04/22/15 0550 04/24/15 0520 04/25/15 0510  NA  --  139 137  K  --  3.5 3.5  CL  --  110 107  CO2  --  21* 23  GLUCOSE  --  124* 129*  BUN  --  <5* <5*  CREATININE 0.66 0.57 0.61  CALCIUM  --  8.2* 8.0*   Liver Function Tests:  Recent Labs Lab 04/25/15 0510  AST 85*  ALT 71*  ALKPHOS 91  BILITOT 0.2*  PROT 5.6*  ALBUMIN 1.7*   No results for input(s): AMMONIA in the last 168 hours. CBC:  Recent Labs Lab 04/21/15 0639 04/22/15 0550 04/23/15 0655 04/24/15 0520 04/25/15 0510  WBC 5.8 6.5 8.5 9.2 8.0  HGB 10.1* 10.1* 9.9* 9.5* 9.1*  HCT 30.4* 30.9* 29.6* 29.1* 28.1*  MCV 90.2 90.9 90.0 88.4 91.2  PLT 773* 930* 1162* 1021* 1036*    CBG:  Recent Labs Lab 04/25/15 0656 04/25/15 1136 04/25/15 1800 04/26/15 0013 04/26/15 0611  GLUCAP 125* 109* 140* 130* 109*    Recent Results (from the past 240 hour(s))  Culture, blood (routine x 2)     Status: None   Collection Time: 04/19/15  1:00 PM  Result Value Ref Range Status   Specimen Description BLOOD BLOOD RIGHT ARM  Final   Special Requests BOTTLES DRAWN AEROBIC AND ANAEROBIC 5CC  Final   Culture  Setup Time   Final    GRAM NEGATIVE RODS IN BOTH AEROBIC AND ANAEROBIC BOTTLES CRITICAL RESULT CALLED TO, READ BACK BY AND VERIFIED WITH: TO KGENGLER(RN) BY TCLEVELAND 02/18/2015 AT 2:54AM    Culture PROVIDENCIA STUARTII  Final   Report Status 04/22/2015 FINAL  Final   Organism ID, Bacteria PROVIDENCIA STUARTII  Final      Susceptibility   Providencia stuartii - MIC*     AMPICILLIN >=32 RESISTANT Resistant     CEFAZOLIN >=64 RESISTANT Resistant     CEFEPIME <=1 SENSITIVE Sensitive     CEFTAZIDIME 16 INTERMEDIATE Intermediate     CEFTRIAXONE <=1 SENSITIVE Sensitive     CIPROFLOXACIN 2 INTERMEDIATE Intermediate     GENTAMICIN 4 RESISTANT Resistant     IMIPENEM 2 SENSITIVE Sensitive     TRIMETH/SULFA >=320 RESISTANT Resistant     AMPICILLIN/SULBACTAM 16 INTERMEDIATE Intermediate     PIP/TAZO <=4 SENSITIVE Sensitive     * PROVIDENCIA STUARTII  Culture, blood (routine x 2)     Status: None   Collection Time: 04/19/15  1:00 PM  Result Value Ref Range Status   Specimen Description BLOOD BLOOD RIGHT ARM  Final   Special Requests BOTTLES DRAWN AEROBIC AND ANAEROBIC 5CC  Final   Culture  Setup Time   Final    GRAM NEGATIVE RODS IN BOTH AEROBIC AND ANAEROBIC BOTTLES CRITICAL RESULT CALLED TO, READ BACK BY AND VERIFIED WITH: TO KGENGLER(RN) BY TCLEVELAND 04/20/2015 AT 2:56AM    Culture   Final    PROVIDENCIA STUARTII SUSCEPTIBILITIES PERFORMED ON PREVIOUS CULTURE WITHIN THE LAST 5 DAYS.    Report Status 04/22/2015 FINAL  Final  Culture, blood (routine x 2)     Status: None (Preliminary result)   Collection Time: 04/22/15 11:35 AM  Result Value Ref Range Status   Specimen Description BLOOD RIGHT HAND  Final   Special Requests BOTTLES DRAWN AEROBIC ONLY 10CC  Final   Culture NO GROWTH 3 DAYS  Final   Report Status PENDING  Incomplete  Culture, blood (routine x 2)     Status: None (Preliminary result)   Collection Time: 04/22/15 11:44 AM  Result Value Ref Range Status   Specimen Description BLOOD LEFT HAND  Final   Special Requests BOTTLES DRAWN AEROBIC ONLY 3CC  Final   Culture NO GROWTH 3 DAYS  Final   Report Status PENDING  Incomplete      Studies/Results: No results found.  Medications:  Scheduled: . acetaminophen  650 mg Oral TID  . antiseptic oral rinse  7 mL Mouth Rinse BID  . apixaban  2.5 mg Oral BID  . atorvastatin  10 mg Oral q1800  .  calcium-vitamin D  1 tablet Oral BID  . cefTRIAXone (ROCEPHIN)  IV  2 g Intravenous Q24H  . feeding supplement (ENSURE ENLIVE)  237 mL Oral TID BM  . ferrous sulfate  325 mg Oral BID WC  . hydroxyurea  1,000 mg Oral Q breakfast  . levothyroxine  100 mcg Intravenous Daily  .  multivitamin  5 mL Oral Daily  . pantoprazole (PROTONIX) IV  40 mg Intravenous Q24H  . senna-docusate  1 tablet Oral QPM  . sodium chloride  1 spray Each Nare Daily   Continuous: . dextrose 5 % and 0.45 % NaCl with KCl 20 mEq/L 75 mL/hr at 04/26/15 0357   OVF:[id number], labetalol, methocarbamol, ondansetron **OR** ondansetron (ZOFRAN) IV  Assessment/Plan:  Principal Problem:   Ischemia of foot Active Problems:   Essential thrombocytosis   Hypothyroidism   Dyslipidemia   Paroxysmal atrial fibrillation   Advanced dementia   Dry gangrene   Goals of care, counseling/discussion   Encounter for palliative care   Rash and nonspecific skin eruption    Ischemic right foot Seen by vascular surgery. Conservative approach as patient is not a candidate for surgery last seen by Dr. early on 8/16. According to Rosetta Posner, MD , there is no surgical intervention planned and he recommends a palliative approach. Vascular surgery has clarified on multiple occasions that patient had an end of life event. Patient also being followed by palliative care, they have  recommended daughter to consider a [employer] has a final disposition given poor prognosis as we have exhausted all treatment options at this time  Gram-negative Bacteremia/sepsis , likely from ischemic foot Final culture reports are available. Providencia was isolated. Narrow antibiotics spectrum. Source is likely her ischemic foot. Spoke with the daughter and told her that she will be at risk of recurrent bacteremia. She will need another 3 days of Rocephin, daughter wishes to delay discharge until her antibiotics are completed, even though continuation of  antibiotics would be futile, we can consider continued administration of Rocephin IM if the patient has a bed available tomorrow. Most likely she will keep getting bacteremic because of her limb ischemia.  Acute encephalopathy Evaluation revealed increased ammonia level. TSH was elevated. However her free T4 was noted to be elevated. She was also started on lactulose. Mental status did not change appreciably. However, with initiation of antibiotics it appeared that there was an improvement. It is unclear if the improvement was significant or not. Patient remains poorly responsive. Palliative care recommending transfer to inpatient hospice or hospice home.   History of hypothyroidism Currently on intravenous levothyroxin. Can be changed over to oral once she is taking orally.  History of Atrial fibrillation Currently on Eliquis. May discontinue prior to discharge  Essential Hypertension Monitor blood pressures closely. Well controlled off antihypertensive regimen.  Thrombocytosis Continue hydroxyurea once patient able to tolerate oral regimen.  H/o dementia Mental status is worse compared to her usual baseline. According to reports.   Goals of care Patient seen by palliative medicine. She doesn't appear to be making much improvement.   DVT Prophylaxis: Eliquis  Code Status: DO NOT RESUSCITATE  Family Communication: No family at bedside  Disposition Plan:   Awaiting placement at  hospice home placement, prognosis poor    LOS: 14 days   [facility] Pager [phone]  04/26/2015, 10:21 AM  If 7PM-7AM, please contact night-coverage at [email or web address], password [facility]

## 2015-04-27 LAB — CULTURE, BLOOD (ROUTINE X 2)
CULTURE: NO GROWTH
Culture: NO GROWTH

## 2015-04-27 LAB — GLUCOSE, CAPILLARY
GLUCOSE-CAPILLARY: 104 mg/dL — AB (ref 65–99)
Glucose-Capillary: 113 mg/dL — ABNORMAL HIGH (ref 65–99)
Glucose-Capillary: 152 mg/dL — ABNORMAL HIGH (ref 65–99)
Glucose-Capillary: 110 mg/dL — ABNORMAL HIGH (ref 65–99)

## 2015-04-27 MED ORDER — PANTOPRAZOLE SODIUM 40 MG PO TBEC
40.0000 mg | DELAYED_RELEASE_TABLET | Freq: Every day | ORAL | Status: DC
  Administered 2015-04-27 – 2015-04-29 (×3): 40 mg via ORAL
  Filled 2015-04-27 (×3): qty 1

## 2015-04-27 NOTE — Progress Notes (Addendum)
LATE ENTRY for 04/24/2015 around 4pm  Spoke with patients daughter Arimental and her husband at length (about 45 minutes). Answered questions with patients family regarding direction of patients care. Family with questions and frustrations regarding the feeling that they are not getting consistent information from the care team.  Plan: arrange multidisciplinary family meeting to assist in providing consistent information regarding the patients plan of care.   Provided patients daughter with DD personal cell number to contact if additional questions arise over the weekend.  Family was thankful for the visit.   Natavia Sublette, Bryn Gulling

## 2015-04-27 NOTE — Progress Notes (Signed)
Late Entry for 04/21/15  Spoke with patients daughter regarding care concerns related to patient lethargy and staff not feeding patient. Explained the safety risk for staff feeding patient when she is not fully alert. Also discussed patients current iv antibiotic orders and reviewed the plan of care documented in progress notes. Family thankful for visit. Roshell Brigham, Bryn Gulling

## 2015-04-27 NOTE — Progress Notes (Signed)
[facility] PROGRESS NOTE  Tanya Farmer ERX:[id number] DOB: 1925/12/15 DOA: 04/12/2015  PCP: Blanchie Serve, MD  Brief HPI: 81 year old African-American female with a past medical history of dementia, atrial fibrillation, hypertension, presented with ischemic right foot and altered mental status. She was seen by vascular surgery who recommended conservative approach. She subsequently was noted to have bacteremia. Palliative medicine is also following. Patient is not improving. Patient's daughter in somewhat of denial.   Past medical history:  Past Medical History  Diagnosis Date  . Hypertension   . Hyperlipidemia   . Thyroid disease     Hypothyroidism  . GERD (gastroesophageal reflux disease)   . Arthritis     Degenerative  . Peripheral vascular disease     Left   Fem-Pop and  tibial Thrombectomy  . High platelet count   . Dementia   . Pain in toe of right foot   . Hypothyroidism   . Long term (current) use of anticoagulants 03/13/2014  . Neuromuscular disorder   . Parkinson's disease     Consultants: Vascular surgery, palliative medicine  Procedures: None  Antibiotics: Zosyn, changed over to ceftriaxone 8/24  Subjective: Patient is lying in the bed with eyes closed. Does not respond.   Objective: Vital Signs  Filed Vitals:   04/26/15 0754 04/26/15 1611 04/26/15 2106 04/27/15 0441  BP: 134/82 130/96 140/68 134/87  Pulse: 68 99 106 88  Temp: 98.2 F (36.8 C) 98.4 F (36.9 C) 97.7 F (36.5 C) 98.6 F (37 C)  TempSrc: Oral Oral Oral Oral  Resp: 18 18 16 18   Height:      Weight:      SpO2: 100% 99% 100% 100%    Intake/Output Summary (Last 24 hours) at 04/27/15 1015 Last data filed at 04/27/15 0600  Gross per 24 hour  Intake    305 ml  Output      0 ml  Net    305 ml   Filed Weights   04/23/15 2154 04/24/15 2109 04/25/15 2010  Weight: 58.2 kg (128 lb 4.9 oz) 58.7 kg (129 lb 6.6 oz) 57.7 kg (127 lb 3.3 oz)    General appearance: Somnolent. In no  distress. Resp: Diminished air entry at the bases. No crackles or wheezing. Cardio: regular rate and rhythm, S1, S2 normal, no murmur, click, rub or gallop GI: soft, non-tender; bowel sounds normal; no masses,  no organomegaly Extremities: Right foot is cold to touch. Black discoloration.   Lab Results:  Basic Metabolic Panel:  Recent Labs Lab 04/22/15 0550 04/24/15 0520 04/25/15 0510  NA  --  139 137  K  --  3.5 3.5  CL  --  110 107  CO2  --  21* 23  GLUCOSE  --  124* 129*  BUN  --  <5* <5*  CREATININE 0.66 0.57 0.61  CALCIUM  --  8.2* 8.0*   Liver Function Tests:  Recent Labs Lab 04/25/15 0510  AST 85*  ALT 71*  ALKPHOS 91  BILITOT 0.2*  PROT 5.6*  ALBUMIN 1.7*   CBC:  Recent Labs Lab 04/21/15 0639 04/22/15 0550 04/23/15 0655 04/24/15 0520 04/25/15 0510  WBC 5.8 6.5 8.5 9.2 8.0  HGB 10.1* 10.1* 9.9* 9.5* 9.1*  HCT 30.4* 30.9* 29.6* 29.1* 28.1*  MCV 90.2 90.9 90.0 88.4 91.2  PLT 773* 930* 1162* 1021* 1036*    CBG:  Recent Labs Lab 04/26/15 0611 04/26/15 1206 04/26/15 1925 04/27/15 0037 04/27/15 0557  GLUCAP 109* 124* 109* 104* 113*  Recent Results (from the past 240 hour(s))  Culture, blood (routine x 2)     Status: None   Collection Time: 04/19/15  1:00 PM  Result Value Ref Range Status   Specimen Description BLOOD BLOOD RIGHT ARM  Final   Special Requests BOTTLES DRAWN AEROBIC AND ANAEROBIC 5CC  Final   Culture  Setup Time   Final    GRAM NEGATIVE RODS IN BOTH AEROBIC AND ANAEROBIC BOTTLES CRITICAL RESULT CALLED TO, READ BACK BY AND VERIFIED WITH: TO KGENGLER(RN) BY TCLEVELAND 02/18/2015 AT 2:54AM    Culture PROVIDENCIA STUARTII  Final   Report Status 04/22/2015 FINAL  Final   Organism ID, Bacteria PROVIDENCIA STUARTII  Final      Susceptibility   Providencia stuartii - MIC*    AMPICILLIN >=32 RESISTANT Resistant     CEFAZOLIN >=64 RESISTANT Resistant     CEFEPIME <=1 SENSITIVE Sensitive     CEFTAZIDIME 16 INTERMEDIATE  Intermediate     CEFTRIAXONE <=1 SENSITIVE Sensitive     CIPROFLOXACIN 2 INTERMEDIATE Intermediate     GENTAMICIN 4 RESISTANT Resistant     IMIPENEM 2 SENSITIVE Sensitive     TRIMETH/SULFA >=320 RESISTANT Resistant     AMPICILLIN/SULBACTAM 16 INTERMEDIATE Intermediate     PIP/TAZO <=4 SENSITIVE Sensitive     * PROVIDENCIA STUARTII  Culture, blood (routine x 2)     Status: None   Collection Time: 04/19/15  1:00 PM  Result Value Ref Range Status   Specimen Description BLOOD BLOOD RIGHT ARM  Final   Special Requests BOTTLES DRAWN AEROBIC AND ANAEROBIC 5CC  Final   Culture  Setup Time   Final    GRAM NEGATIVE RODS IN BOTH AEROBIC AND ANAEROBIC BOTTLES CRITICAL RESULT CALLED TO, READ BACK BY AND VERIFIED WITH: TO KGENGLER(RN) BY TCLEVELAND 04/20/2015 AT 2:56AM    Culture   Final    PROVIDENCIA STUARTII SUSCEPTIBILITIES PERFORMED ON PREVIOUS CULTURE WITHIN THE LAST 5 DAYS.    Report Status 04/22/2015 FINAL  Final  Culture, blood (routine x 2)     Status: None (Preliminary result)   Collection Time: 04/22/15 11:35 AM  Result Value Ref Range Status   Specimen Description BLOOD RIGHT HAND  Final   Special Requests BOTTLES DRAWN AEROBIC ONLY 10CC  Final   Culture NO GROWTH 4 DAYS  Final   Report Status PENDING  Incomplete  Culture, blood (routine x 2)     Status: None (Preliminary result)   Collection Time: 04/22/15 11:44 AM  Result Value Ref Range Status   Specimen Description BLOOD LEFT HAND  Final   Special Requests BOTTLES DRAWN AEROBIC ONLY 3CC  Final   Culture NO GROWTH 4 DAYS  Final   Report Status PENDING  Incomplete      Studies/Results: No results found.  Medications:  Scheduled: . acetaminophen  650 mg Oral TID  . antiseptic oral rinse  7 mL Mouth Rinse BID  . apixaban  2.5 mg Oral BID  . atorvastatin  10 mg Oral q1800  . calcium-vitamin D  1 tablet Oral BID  . cefTRIAXone (ROCEPHIN)  IV  2 g Intravenous Q24H  . feeding supplement (ENSURE ENLIVE)  237 mL Oral TID BM   . ferrous sulfate  325 mg Oral BID WC  . hydroxyurea  1,000 mg Oral Q breakfast  . levothyroxine  200 mcg Oral QAC breakfast  . multivitamin  5 mL Oral Daily  . pantoprazole  40 mg Oral Daily  . senna-docusate  1 tablet Oral QPM  .  sodium chloride  1 spray Each Nare Daily   Continuous: . dextrose 5 % and 0.45 % NaCl with KCl 20 mEq/L 75 mL/hr at 04/26/15 1853   [id number], labetalol, methocarbamol, ondansetron **OR** ondansetron (ZOFRAN) IV  Assessment/Plan:  Principal Problem:   Ischemia of foot Active Problems:   Essential thrombocytosis   Hypothyroidism   Dyslipidemia   Paroxysmal atrial fibrillation   Advanced dementia   Dry gangrene   Goals of care, counseling/discussion   Encounter for palliative care   Rash and nonspecific skin eruption    Ischemic right foot Seen by vascular surgery. Conservative approach as patient is not a candidate for surgery. According to Dr. Donnetta Hutching (vascular surgery), there is no surgical intervention planned and he recommends a palliative approach. Vascular surgery has clarified on multiple occasions that patient has had an end of life event. Patient also being followed by palliative care. They have  recommended daughter to consider [facility] as a final disposition given poor prognosis as we have exhausted all treatment options at this time. Daughter is still somewhat resistant. I had a long conversation with the daughter and her husband today. They plan to tour residential hospice facilities. I have explained to them that patient is in the end-of-life phase. There is no chance of meaningful recovery.  Gram-negative Bacteremia/sepsis , likely from ischemic foot Final culture reports are available. Providencia was isolated. He was initially on Zosyn. Currently on ceftriaxone. Source is likely her ischemic foot. Today she'll complete 8 days of treatment. Considering her poor prognosis continuation of antibiotics would be futile. Most likely she  will keep getting bacteremic because of her limb ischemia.  Acute encephalopathy in the setting of history of dementia Evaluation revealed increased ammonia level. TSH was elevated. However her free T4 was noted to be elevated. She was also started on lactulose. Mental status did not change appreciably. However, with initiation of antibiotics it appeared that there was an improvement. But it doesn't appear that the improvement was sustained. Patient remains poorly responsive. Palliative care recommending transfer to inpatient hospice or SNF with hospice.   History of hypothyroidism Currently on levothyroxin.   History of Atrial fibrillation Currently on Eliquis.   Essential Hypertension Monitor blood pressures closely. Well controlled off antihypertensive regimen.  Thrombocytosis Continue hydroxyurea.  Goals of care Patient seen by palliative medicine. She doesn't appear to be making much improvement. Patient's daughter in denial. Very resistant to conversations regarding disposition. I have explained to her in no uncertain terms that patient is in end-of-life phase. There is no hope for any kind of meaningful recovery.  DVT Prophylaxis: Eliquis  Code Status: DO NOT RESUSCITATE  Family Communication: Discussed with daughter today Disposition Plan: Ongoing discussions with family regarding plan for disposition. Patient would benefit from going to residential hospice. Family to tour such facilities. Case management and social worker to discuss with family as well.    LOS: 15 days   [facility] Pager [phone]   04/27/2015, 10:15 AM  If 7PM-7AM, please contact night-coverage at [email or web address], password [facility]

## 2015-04-27 NOTE — Care Management Important Message (Signed)
Important Message  Patient Details  Name: Tanya Farmer MRN: [id number] Date of Birth: 19-Apr-1926   Medicare Important Message Given:  Yes-fourth notification given    Delorse Lek 04/27/2015, 11:03 AM

## 2015-04-27 NOTE — Care Management Note (Signed)
Case Management Note  Patient Details  Name: Tanya Farmer MRN: [id number] Date of Birth: May 16, 1926  Subjective/Objective:        CM following for progression and d/c planning.            Action/Plan: 04/27/2015 Continuing to follow for progression and d/c planning. Attempting to speak with pt daughter re d/c plan. Spoke with Dr Maryland Pink who spent time with the daughter and her husband this am. Attempted to speak with daughter immediately afterward to clarify d/c plan. Daughter was unable to talk at this time due to appointments however stated that she would return to the hospital to discuss plans for her mother.   Expected Discharge Date:                  Expected Discharge Plan:  [place]  In-House Referral:  Clinical Social Work  Discharge planning Services     Post Acute Care Choice:  Hospice Choice offered to:     DME Arranged:    DME Agency:     HH Arranged:    [place] Agency:     Status of Service:  In process, will continue to follow  Medicare Important Message Given:  Yes-fourth notification given Date Medicare IM Given:    Medicare IM give by:    Date Additional Medicare IM Given:    Additional Medicare Important Message give by:     If discussed at [place] of Stay Meetings, dates discussed:    Additional Comments:  Adron Bene, RN 04/27/2015, 3:34 PM

## 2015-04-28 LAB — GLUCOSE, CAPILLARY
GLUCOSE-CAPILLARY: 139 mg/dL — AB (ref 65–99)
Glucose-Capillary: 124 mg/dL — ABNORMAL HIGH (ref 65–99)
Glucose-Capillary: 130 mg/dL — ABNORMAL HIGH (ref 65–99)

## 2015-04-28 NOTE — Progress Notes (Signed)
Chaplain was asked to visit by family. Chaplain came into room with a conversation by Nurse Malachy Mood). Family is have a difficult time understand what is going on. They expressed confusion with all the information they are receiving. They are requesting Dr. Armandina Gemma to come and give compassionate clarity. Chaplain prayed with the family for understanding. They expressed their disappointment with the level of compassion by some of staff. They are trying to balance many different care issues and need help and clarity with compassion.  Chaplain will continue to follow.    04/28/15 0700  Clinical Encounter Type  Visited With Patient and family together  Visit Type Spiritual support  Referral From Family  Consult/Referral To Palliative care  Spiritual Encounters  Spiritual Needs Emotional;Prayer  Stress Factors  Family Stress Factors Lack of knowledge

## 2015-04-28 NOTE — Progress Notes (Addendum)
Phone call with daughter Corliss Parish:  She is touring hospice facilities today, she is pleased with her interaction at [employer] [facility] She will be meeting with Erling Conte from [facility] later this evening.  Advised her that Dr Domingo Cocking from palliative medicine can follow up in am if still needed.  Reassured her that hospice home is still the best possible discharge option.    Loistine Chance MD [place] team

## 2015-04-28 NOTE — Progress Notes (Signed)
[facility] PROGRESS NOTE  Tanya Farmer WUJ:[id number] DOB: 04-23-1926 DOA: 04/12/2015  PCP: Blanchie Serve, MD  Brief HPI: 81 year old African-American female with a past medical history of dementia, atrial fibrillation, hypertension, presented with ischemic right foot and altered mental status. She was seen by vascular surgery who recommended conservative approach. She subsequently was noted to have bacteremia. Palliative medicine is also following. Patient is not improving. Patient's daughter in somewhat of denial.   Past medical history:  Past Medical History  Diagnosis Date  . Hypertension   . Hyperlipidemia   . Thyroid disease     Hypothyroidism  . GERD (gastroesophageal reflux disease)   . Arthritis     Degenerative  . Peripheral vascular disease     Left   Fem-Pop and  tibial Thrombectomy  . High platelet count   . Dementia   . Pain in toe of right foot   . Hypothyroidism   . Long term (current) use of anticoagulants 03/13/2014  . Neuromuscular disorder   . Parkinson's disease     Consultants: Vascular surgery, palliative medicine  Procedures: None  Antibiotics: Zosyn, changed over to ceftriaxone 8/24  Subjective: Patient not very communicative. Eyes closed.   Objective: Vital Signs  Filed Vitals:   04/27/15 1750 04/27/15 2034 04/28/15 0432 04/28/15 0936  BP: 128/78 113/77 118/89 138/96  Pulse: 107 114 124 101  Temp: 98.4 F (36.9 C) 98.3 F (36.8 C) 98.7 F (37.1 C) 98.6 F (37 C)  TempSrc: Oral Axillary Axillary Axillary  Resp: 18 19 22 20   Height:      Weight:  57.7 kg (127 lb 3.3 oz)    SpO2: 100% 100% 100% 100%    Intake/Output Summary (Last 24 hours) at 04/28/15 1107 Last data filed at 04/28/15 0900  Gross per 24 hour  Intake   2410 ml  Output      0 ml  Net   2410 ml   Filed Weights   04/24/15 2109 04/25/15 2010 04/27/15 2034  Weight: 58.7 kg (129 lb 6.6 oz) 57.7 kg (127 lb 3.3 oz) 57.7 kg (127 lb 3.3 oz)    General  appearance: Somnolent. In no distress. Resp: Diminished air entry at the bases. No crackles or wheezing. Cardio: regular rate and rhythm, S1, S2 normal, no murmur, click, rub or gallop GI: soft, non-tender; bowel sounds normal; no masses,  no organomegaly Extremities: Right foot is cold to touch. Black discoloration.   Lab Results:  Basic Metabolic Panel:  Recent Labs Lab 04/22/15 0550 04/24/15 0520 04/25/15 0510  NA  --  139 137  K  --  3.5 3.5  CL  --  110 107  CO2  --  21* 23  GLUCOSE  --  124* 129*  BUN  --  <5* <5*  CREATININE 0.66 0.57 0.61  CALCIUM  --  8.2* 8.0*   Liver Function Tests:  Recent Labs Lab 04/25/15 0510  AST 85*  ALT 71*  ALKPHOS 91  BILITOT 0.2*  PROT 5.6*  ALBUMIN 1.7*   CBC:  Recent Labs Lab 04/22/15 0550 04/23/15 0655 04/24/15 0520 04/25/15 0510  WBC 6.5 8.5 9.2 8.0  HGB 10.1* 9.9* 9.5* 9.1*  HCT 30.9* 29.6* 29.1* 28.1*  MCV 90.9 90.0 88.4 91.2  PLT 930* 1162* 1021* 1036*    CBG:  Recent Labs Lab 04/27/15 0557 04/27/15 1303 04/27/15 1709 04/28/15 0006 04/28/15 0640  GLUCAP 113* 152* 110* 130* 124*    Recent Results (from the past 240 hour(s))  Culture, blood (routine x 2)     Status: None   Collection Time: 04/19/15  1:00 PM  Result Value Ref Range Status   Specimen Description BLOOD BLOOD RIGHT ARM  Final   Special Requests BOTTLES DRAWN AEROBIC AND ANAEROBIC 5CC  Final   Culture  Setup Time   Final    GRAM NEGATIVE RODS IN BOTH AEROBIC AND ANAEROBIC BOTTLES CRITICAL RESULT CALLED TO, READ BACK BY AND VERIFIED WITH: TO KGENGLER(RN) BY TCLEVELAND 02/18/2015 AT 2:54AM    Culture PROVIDENCIA STUARTII  Final   Report Status 04/22/2015 FINAL  Final   Organism ID, Bacteria PROVIDENCIA STUARTII  Final      Susceptibility   Providencia stuartii - MIC*    AMPICILLIN >=32 RESISTANT Resistant     CEFAZOLIN >=64 RESISTANT Resistant     CEFEPIME <=1 SENSITIVE Sensitive     CEFTAZIDIME 16 INTERMEDIATE Intermediate      CEFTRIAXONE <=1 SENSITIVE Sensitive     CIPROFLOXACIN 2 INTERMEDIATE Intermediate     GENTAMICIN 4 RESISTANT Resistant     IMIPENEM 2 SENSITIVE Sensitive     TRIMETH/SULFA >=320 RESISTANT Resistant     AMPICILLIN/SULBACTAM 16 INTERMEDIATE Intermediate     PIP/TAZO <=4 SENSITIVE Sensitive     * PROVIDENCIA STUARTII  Culture, blood (routine x 2)     Status: None   Collection Time: 04/19/15  1:00 PM  Result Value Ref Range Status   Specimen Description BLOOD BLOOD RIGHT ARM  Final   Special Requests BOTTLES DRAWN AEROBIC AND ANAEROBIC 5CC  Final   Culture  Setup Time   Final    GRAM NEGATIVE RODS IN BOTH AEROBIC AND ANAEROBIC BOTTLES CRITICAL RESULT CALLED TO, READ BACK BY AND VERIFIED WITH: TO KGENGLER(RN) BY TCLEVELAND 04/20/2015 AT 2:56AM    Culture   Final    PROVIDENCIA STUARTII SUSCEPTIBILITIES PERFORMED ON PREVIOUS CULTURE WITHIN THE LAST 5 DAYS.    Report Status 04/22/2015 FINAL  Final  Culture, blood (routine x 2)     Status: None   Collection Time: 04/22/15 11:35 AM  Result Value Ref Range Status   Specimen Description BLOOD RIGHT HAND  Final   Special Requests BOTTLES DRAWN AEROBIC ONLY 10CC  Final   Culture NO GROWTH 5 DAYS  Final   Report Status 04/27/2015 FINAL  Final  Culture, blood (routine x 2)     Status: None   Collection Time: 04/22/15 11:44 AM  Result Value Ref Range Status   Specimen Description BLOOD LEFT HAND  Final   Special Requests BOTTLES DRAWN AEROBIC ONLY 3CC  Final   Culture NO GROWTH 5 DAYS  Final   Report Status 04/27/2015 FINAL  Final      Studies/Results: No results found.  Medications:  Scheduled: . acetaminophen  650 mg Oral TID  . antiseptic oral rinse  7 mL Mouth Rinse BID  . apixaban  2.5 mg Oral BID  . atorvastatin  10 mg Oral q1800  . calcium-vitamin D  1 tablet Oral BID  . cefTRIAXone (ROCEPHIN)  IV  2 g Intravenous Q24H  . feeding supplement (ENSURE ENLIVE)  237 mL Oral TID BM  . ferrous sulfate  325 mg Oral BID WC  .  hydroxyurea  1,000 mg Oral Q breakfast  . levothyroxine  200 mcg Oral QAC breakfast  . multivitamin  5 mL Oral Daily  . pantoprazole  40 mg Oral Daily  . senna-docusate  1 tablet Oral QPM  . sodium chloride  1 spray Each Nare Daily  Continuous: . dextrose 5 % and 0.45 % NaCl with KCl 20 mEq/L 75 mL/hr at 04/28/15 0153   UXN:ATFTDDUKGUR, labetalol, methocarbamol, ondansetron **OR** ondansetron (ZOFRAN) IV  Assessment/Plan:  Principal Problem:   Ischemia of foot Active Problems:   Essential thrombocytosis   Hypothyroidism   Dyslipidemia   Paroxysmal atrial fibrillation   Advanced dementia   Dry gangrene   Goals of care, counseling/discussion   Encounter for palliative care   Rash and nonspecific skin eruption    Ischemic right foot Seen by vascular surgery. Conservative approach as patient is not a candidate for surgery. According to Dr. Donnetta Hutching (vascular surgery), there is no surgical intervention planned and he recommends a palliative approach. Vascular surgery has clarified on multiple occasions that patient has had an end of life event. Patient also being followed by palliative care. They have  recommended daughter to consider [facility] as a final disposition given poor prognosis as we have exhausted all treatment options at this time. Daughter is still somewhat resistant. Multiple providers including myself have had long discussions with the patient and her husband. They continue to vacillate between various options provided to them. Palliative medicine to meet with them again. They plan to tour residential hospice facilities. I have explained to them that patient is in the end-of-life phase. There is no chance of meaningful recovery.  Gram-negative Bacteremia/sepsis, likely from ischemic foot Final culture reports are available. Providencia was isolated. He was initially on Zosyn. Currently on ceftriaxone. Source is likely her ischemic foot. Today, 8/30, she'll complete 9 days  of treatment. Considering her poor prognosis continuation of antibiotics would be futile. Most likely she will keep getting bacteremic because of her limb ischemia. Continue ceftriaxone for now.  Acute encephalopathy in the setting of history of dementia Evaluation revealed increased ammonia level. TSH was elevated. However her free T4 was noted to be elevated. She was also started on lactulose. Mental status did not change appreciably. However, with initiation of antibiotics it appeared that there was an improvement. But it doesn't appear that the improvement was sustained. Patient remains poorly responsive. Palliative care recommending transfer to inpatient hospice or SNF with hospice. Family resistant so far.  History of hypothyroidism Currently on levothyroxin.   History of Atrial fibrillation Currently on Eliquis.   Essential Hypertension Monitor blood pressures closely. Well controlled off antihypertensive regimen.  Thrombocytosis Continue hydroxyurea.  Goals of care Patient seen by palliative medicine. She doesn't appear to be making much improvement. Patient's daughter in denial. Very resistant to conversations regarding disposition. I have explained to her in no uncertain terms that patient is in end-of-life phase. There is no hope for any kind of meaningful recovery.  DVT Prophylaxis: Eliquis  Code Status: DO NOT RESUSCITATE  Family Communication: Discussed with daughter today Disposition Plan: Ongoing discussions with family regarding plan for disposition. Patient would benefit from going to residential hospice. Family to tour such facilities. Case management and social worker to discuss with family as well. Palliative Medicine to meet as well.    LOS: 16 days   [facility] Pager [phone]   04/28/2015, 11:07 AM  If 7PM-7AM, please contact night-coverage at [email or web address], password [facility]

## 2015-04-28 NOTE — Care Management Note (Signed)
Case Management Note  Patient Details  Name: PATTI SHORB MRN: [id number] Date of Birth: 09/30/1925  Subjective/Objective:          CM continuing to follow for progression and d/c planning.        CM   Action/Plan: Plan now for residential hospice, pt family has accepted  bed at [facility] , plan is to d/c to residential hospice at [facility] on 04/29/2015  Expected Discharge Date:       04/28/2015           Expected Discharge Plan:  [place]  In-House Referral:  Clinical Social Work  Discharge planning Services     Post Acute Care Choice:  Hospice Choice offered to:     DME Arranged:    DME Agency:     HH Arranged:    [place] Agency:     Status of Service:  Completed, signed off  Medicare Important Message Given:  Yes-fourth notification given Date Medicare IM Given:    Medicare IM give by:    Date Additional Medicare IM Given:    Additional Medicare Important Message give by:     If discussed at [place] of Stay Meetings, dates discussed:    Additional Comments:  Adron Bene, RN 04/28/2015, 4:29 PM

## 2015-04-28 NOTE — Care Management Note (Signed)
Case Management Note  Patient Details  Name: Tanya Farmer MRN: [id number] Date of Birth: 09/17/1925  Subjective/Objective:       CM continuing to follow for progression and d/c planning.             Action/Plan: 04/27/2015 Met with pt daughter , Mrs Tanya Farmer and son in law , Tanya Farmer. They continue to express confusion re pt condition and plan of care. Have received conflicting information from various caregiver per their interpretation.  This CM attempted to explain that our current need is to find the appropriate level of care for this pt as she no longer requires acute hospitalization. The family has many questions still, unfortunately many that can not be answered by medical staff, they need clarity on how long this pt has as they have been told weeks to months and are concerned at her continued declined while in the hospital. There have been several Pall meetings and we are requesting another in the hope that they will be able to determine if they would like to return to the SNF for ongoing IV antibiotics and attempts to provide some rehab and assist with feedings or if residential hospice may be appropriate.   Expected Discharge Date:       Unknown           Expected Discharge Plan:  [place] or return to SNF  In-House Referral:  Clinical Social Work  Discharge planning Services     Post Acute Care Choice:   Choice offered to:     DME Arranged:    DME Agency:     HH Arranged:    [place] Agency:     Status of Service:  In process, will continue to follow  Medicare Important Message Given:  Yes-fourth notification given Date Medicare IM Given:    Medicare IM give by:    Date Additional Medicare IM Given:    Additional Medicare Important Message give by:     If discussed at [place] of Stay Meetings, dates discussed:    Additional Comments:  Adron Bene, RN 04/28/2015, 10:16 AM

## 2015-04-28 NOTE — Consult Note (Signed)
[facility] [place] Liaison: Received request from [place] for family interest in [facility]. Chart reviewed and met with family to complete paper work for transfer Wednesday 04/29/2015. Dr. Orpah Melter to assume care per family request.  RN please call report to [phone].  Please fax discharge summary to [phone].  Please arrange transport for patient to arrive before noon.   Thank you.  Erling Conte, [place]

## 2015-04-28 NOTE — Clinical Social Work Note (Signed)
Patient now comfort care and CSW received consult for residential hospice placement. Hospice search initiated and patient will discharge to [facility] on Wednesday, 8/31.  Family aware that [facility] has a bed and will complete admissions paperwork for facility today.  Krista Som Givens, MSW, LCSW Licensed Clinical Social Worker [place] [phone]

## 2015-04-29 DIAGNOSIS — I998 Other disorder of circulatory system: Secondary | ICD-10-CM

## 2015-04-29 LAB — GLUCOSE, CAPILLARY
GLUCOSE-CAPILLARY: 107 mg/dL — AB (ref 65–99)
GLUCOSE-CAPILLARY: 152 mg/dL — AB (ref 65–99)
Glucose-Capillary: 118 mg/dL — ABNORMAL HIGH (ref 65–99)

## 2015-04-29 MED ORDER — ONDANSETRON HCL 4 MG PO TABS: 4.0000 mg | ORAL_TABLET | Freq: Four times a day (QID) | ORAL | Status: AC | PRN

## 2015-04-29 MED ORDER — APIXABAN 2.5 MG PO TABS: 2.5000 mg | ORAL_TABLET | Freq: Two times a day (BID) | ORAL | Status: AC

## 2015-04-29 MED ORDER — CEFUROXIME AXETIL 250 MG PO TABS: 250.0000 mg | ORAL_TABLET | Freq: Every day | ORAL | Status: AC

## 2015-04-29 MED ORDER — MORPHINE SULFATE (CONCENTRATE) 10 MG /0.5 ML PO SOLN: 10.0000 mg | ORAL | Status: AC | PRN

## 2015-04-29 MED ORDER — METHOCARBAMOL 500 MG PO TABS: 500.0000 mg | ORAL_TABLET | Freq: Three times a day (TID) | ORAL | Status: AC | PRN

## 2015-04-29 NOTE — Discharge Summary (Signed)
Tanya Farmer, is a 81 y.o. female  DOB 1926/02/04  MRN [id number].  Admission date:  04/12/2015  Admitting Physician  Rise Patience, MD  Discharge Date:  04/29/2015   Primary MD  Blanchie Serve, MD  Recommendations for primary care physician for things to follow:  - management as per [facility] place   Admission Diagnosis  Dry gangrene [I96] Ischemia of foot [I99.8]   Discharge Diagnosis  Dry gangrene [I96] Ischemia of foot [I99.8]   Principal Problem:   Ischemia of foot Active Problems:   Essential thrombocytosis   Hypothyroidism   Dyslipidemia   Paroxysmal atrial fibrillation   Advanced dementia   Dry gangrene   Goals of care, counseling/discussion   Encounter for palliative care   Rash and nonspecific skin eruption      Past Medical History  Diagnosis Date  . Hypertension   . Hyperlipidemia   . Thyroid disease     Hypothyroidism  . GERD (gastroesophageal reflux disease)   . Arthritis     Degenerative  . Peripheral vascular disease     Left   Fem-Pop and  tibial Thrombectomy  . High platelet count   . Dementia   . Pain in toe of right foot   . Hypothyroidism   . Long term (current) use of anticoagulants 03/13/2014  . Neuromuscular disorder   . Parkinson's disease     Past Surgical History  Procedure Laterality Date  . Femoral-popliteal bypass graft  2002    Left  Dr. Sherren Mocha Early  . Abdominal hysterectomy    . Cataract surgery      left eye  . Colonoscopy N/A 09/06/2014    Procedure: COLONOSCOPY;  Surgeon: Jeryl Columbia, MD;  Location: [facility] ENDOSCOPY;  Service: Endoscopy;  Laterality: N/A;       History of present illness and  Hospital Course:     Kindly see H&P for history of present illness and admission details, please review complete Labs, Consult reports and Test reports for all details in brief  HPI  from the history and physical done on the day of  admission Tanya Farmer is a 81 y.o. female with history of atrial fibrillation on the Crixivan, chronic kidney disease stage III, hypertension, hypothyroidism and advanced dementia was brought to the ER after patient was found to have darkening of her right foot fifth toe. As per the family patient had not been doing well last few days and has been noticed to have moving her right lower extremity more than normal. Patient's family initially noticed some redness of the toe. Patient is not very communicative because of her dementia. Patient was brought to the ER with concerns of acute ischemic limb and vascular surgeon Dr. Oneida Alar has seen and evaluated the patient and at this time feels patient is best treated conservatively given her comorbidities. Patient on exam is not in acute distress. Patient is noncommunicative and does not provide history. As per the family otherwise patient has not had a nausea vomiting diarrhea.  Hospital Course   Ischemic right foot Seen by vascular surgery. Conservative approach as patient is not a candidate for surgery. According to Dr. Donnetta Hutching (vascular surgery), there is no surgical intervention planned and he recommends a palliative approach. Vascular surgery has clarified on multiple occasions that patient has had an end of life event. Patient also being followed by palliative care. They have recommended daughter to consider [facility] as a final disposition given poor prognosis as we have exhausted all treatment options at this time. Plan is for discharge to [facility] today.  Gram-negative Bacteremia/sepsis, likely from ischemic foot Final culture reports are available. Providencia was isolated. He was initially on Zosyn. Currently on ceftriaxone. Source is likely her ischemic foot. Patient completed 10 days of IV antibiotics during hospital stay, will be discharged on oral cefuroxime.  Acute encephalopathy in the setting of history of dementia Evaluation revealed  increased ammonia level. TSH was elevated. However her free T4 was noted to be elevated. She was also started on lactulose. Mental status did not change appreciably. Patient remains poorly responsive.   History of hypothyroidism  on levothyroxin.   History of Atrial fibrillation Currently on Eliquis.   Essential Hypertension M Well controlled off antihypertensive regimen.      Discharge Condition: < 2 weeks   Follow UP      Discharge Instructions  and  Discharge Medications     Discharge Instructions    Discharge instructions    Complete by:  As directed   Management as per [facility]            Medication List    STOP taking these medications        acetaminophen 325 MG tablet  Commonly known as:  TYLENOL     atorvastatin 10 MG tablet  Commonly known as:  LIPITOR     calcium-vitamin D 500-200 MG-UNIT per tablet  Commonly known as:  OSCAL WITH D     CERTA-VITE PO     Cranberry 250 MG Caps     ferrous sulfate 325 (65 FE) MG tablet     guaiFENesin 600 MG 12 hr tablet  Commonly known as:  MUCINEX     hydroxyurea 500 MG capsule  Commonly known as:  HYDREA     levothyroxine 200 MCG tablet  Commonly known as:  SYNTHROID, LEVOTHROID     losartan 100 MG tablet  Commonly known as:  COZAAR     pantoprazole 40 MG tablet  Commonly known as:  PROTONIX     senna-docusate 8.6-50 MG per tablet  Commonly known as:  Senokot-S      TAKE these medications        apixaban 2.5 MG Tabs tablet  Commonly known as:  ELIQUIS  Take 1 tablet (2.5 mg total) by mouth 2 (two) times daily.     cefUROXime 250 MG tablet  Commonly known as:  CEFTIN  Take 1 tablet (250 mg total) by mouth daily.     methocarbamol 500 MG tablet  Commonly known as:  ROBAXIN  Take 1 tablet (500 mg total) by mouth every 8 (eight) hours as needed for muscle spasms.     morphine CONCENTRATE 10 mg / 0.5 ml concentrated solution  Take 0.5 mLs (10 mg total) by mouth every 2 (two) hours as  needed for severe pain or shortness of breath.     ondansetron 4 MG tablet  Commonly known as:  ZOFRAN  Take 1 tablet (4 mg total) by mouth every  6 (six) hours as needed for nausea.      ASK your doctor about these medications        sodium chloride 0.65 % Soln nasal spray  Commonly known as:  OCEAN  Place 1 spray into both nostrils daily.          Diet and Activity recommendation: See Discharge Instructions above   Consults obtained - Palliative care Vascular surgery  Major procedures and Radiology Reports - PLEASE review detailed and final reports for all details, in brief -      Dg Chest Port 1 View  04/19/2015   CLINICAL DATA:  Patient with right foot ischemia.  EXAM: PORTABLE CHEST - 1 VIEW  COMPARISON:  Chest radiograph 09/08/2014  FINDINGS: Stable enlarged cardiac and mediastinal contours. No large pulmonary consolidation. Stable coarse interstitial opacities bilaterally. No pleural effusion or pneumothorax.  IMPRESSION: Cardiomegaly.  No acute cardiopulmonary process.   Electronically Signed   By: Lovey Newcomer M.D.   On: 04/19/2015 13:43   Dg Foot Complete Right  04/12/2015   CLINICAL DATA:  Discoloration of the fourth and fifth toes, no known injury, initial encounter  EXAM: RIGHT FOOT COMPLETE - 3+ VIEW  COMPARISON:  None.  FINDINGS: There is no evidence of fracture or dislocation. There is no evidence of arthropathy or other focal bone abnormality. Soft tissues are unremarkable.  IMPRESSION: No acute abnormality noted.   Electronically Signed   By: Inez Catalina M.D.   On: 04/12/2015 20:28    Micro Results     Recent Results (from the past 240 hour(s))  Culture, blood (routine x 2)     Status: None   Collection Time: 04/19/15  1:00 PM  Result Value Ref Range Status   Specimen Description BLOOD BLOOD RIGHT ARM  Final   Special Requests BOTTLES DRAWN AEROBIC AND ANAEROBIC 5CC  Final   Culture  Setup Time   Final    GRAM NEGATIVE RODS IN BOTH AEROBIC AND  ANAEROBIC BOTTLES CRITICAL RESULT CALLED TO, READ BACK BY AND VERIFIED WITH: TO KGENGLER(RN) BY TCLEVELAND 02/18/2015 AT 2:54AM    Culture PROVIDENCIA STUARTII  Final   Report Status 04/22/2015 FINAL  Final   Organism ID, Bacteria PROVIDENCIA STUARTII  Final      Susceptibility   Providencia stuartii - MIC*    AMPICILLIN >=32 RESISTANT Resistant     CEFAZOLIN >=64 RESISTANT Resistant     CEFEPIME <=1 SENSITIVE Sensitive     CEFTAZIDIME 16 INTERMEDIATE Intermediate     CEFTRIAXONE <=1 SENSITIVE Sensitive     CIPROFLOXACIN 2 INTERMEDIATE Intermediate     GENTAMICIN 4 RESISTANT Resistant     IMIPENEM 2 SENSITIVE Sensitive     TRIMETH/SULFA >=320 RESISTANT Resistant     AMPICILLIN/SULBACTAM 16 INTERMEDIATE Intermediate     PIP/TAZO <=4 SENSITIVE Sensitive     * PROVIDENCIA STUARTII  Culture, blood (routine x 2)     Status: None   Collection Time: 04/19/15  1:00 PM  Result Value Ref Range Status   Specimen Description BLOOD BLOOD RIGHT ARM  Final   Special Requests BOTTLES DRAWN AEROBIC AND ANAEROBIC 5CC  Final   Culture  Setup Time   Final    GRAM NEGATIVE RODS IN BOTH AEROBIC AND ANAEROBIC BOTTLES CRITICAL RESULT CALLED TO, READ BACK BY AND VERIFIED WITH: TO KGENGLER(RN) BY TCLEVELAND 04/20/2015 AT 2:56AM    Culture   Final    PROVIDENCIA STUARTII SUSCEPTIBILITIES PERFORMED ON PREVIOUS CULTURE WITHIN THE LAST 5 DAYS.  Report Status 04/22/2015 FINAL  Final  Culture, blood (routine x 2)     Status: None   Collection Time: 04/22/15 11:35 AM  Result Value Ref Range Status   Specimen Description BLOOD RIGHT HAND  Final   Special Requests BOTTLES DRAWN AEROBIC ONLY 10CC  Final   Culture NO GROWTH 5 DAYS  Final   Report Status 04/27/2015 FINAL  Final  Culture, blood (routine x 2)     Status: None   Collection Time: 04/22/15 11:44 AM  Result Value Ref Range Status   Specimen Description BLOOD LEFT HAND  Final   Special Requests BOTTLES DRAWN AEROBIC ONLY 3CC  Final   Culture NO  GROWTH 5 DAYS  Final   Report Status 04/27/2015 FINAL  Final       Today   Subjective:   Venia Riveron is noncommunicative.  Objective:   Blood pressure 132/80, pulse 99, temperature 98.2 F (36.8 C), temperature source Axillary, resp. rate 18, height 5\' 3"  (1.6 m), weight 58 kg (127 lb 13.9 oz), SpO2 99 %.   Intake/Output Summary (Last 24 hours) at 04/29/15 1020 Last data filed at 04/29/15 0900  Gross per 24 hour  Intake   1800 ml  Output      0 ml  Net   1800 ml    Exam General appearance: Somnolent. In no distress. Resp: Diminished air entry at the bases. No crackles or wheezing. Cardio: regular rate and rhythm, S1, S2 normal, no murmur, click, rub or gallop GI: soft, non-tender; bowel sounds normal; no masses, no organomegaly Extremities: Right foot is cold to touch. Black discoloration.  Data Review   CBC w Diff: Lab Results  Component Value Date   WBC 8.0 04/25/2015   WBC 6.9 02/11/2015   HGB 9.1* 04/25/2015   HCT 28.1* 04/25/2015   PLT 1036* 04/25/2015   LYMPHOPCT 18 04/13/2015   MONOPCT 6 04/13/2015   EOSPCT 1 04/13/2015   BASOPCT 2* 04/13/2015    CMP: Lab Results  Component Value Date   NA 137 04/25/2015   NA 142 12/30/2014   K 3.5 04/25/2015   CL 107 04/25/2015   CO2 23 04/25/2015   BUN <5* 04/25/2015   BUN 9 12/30/2014   CREATININE 0.61 04/25/2015   CREATININE 0.7 12/30/2014   GLU 72 12/30/2014   PROT 5.6* 04/25/2015   PROT 7.0 01/21/2014   ALBUMIN 1.7* 04/25/2015   BILITOT 0.2* 04/25/2015   ALKPHOS 91 04/25/2015   AST 85* 04/25/2015   ALT 71* 04/25/2015  .   Total Time in preparing paper work, data evaluation and todays exam - 35 minutes  Zarai Orsborn M.D on 04/29/2015 at 10:20 AM  [facility]   Office  [phone]

## 2015-04-29 NOTE — Clinical Social Work Note (Signed)
Patient discharged earlier today to [facility] facility via [place]. Patient's daughter and son-in-law at the bedside. Daughter Arimental requested information on obtaining patient's medical records and this was provided to her.   Alisha Burgo Givens, MSW, LCSW Licensed Clinical Social Worker [employer] [phone]

## 2015-04-29 NOTE — Discharge Instructions (Signed)
Patient as per [facility] place.

## 2015-05-05 ENCOUNTER — Ambulatory Visit: Payer: Medicare Other | Admitting: Interventional Cardiology

## 2015-05-30 DEATH — deceased

## 2015-06-08 ENCOUNTER — Ambulatory Visit: Payer: Self-pay | Admitting: Neurology

## 2015-06-08 ENCOUNTER — Ambulatory Visit: Payer: Medicare Other | Admitting: Neurology

## 2018-04-11 ENCOUNTER — Encounter: Payer: Self-pay | Admitting: Internal Medicine

## 2024-06-29 DEATH — deceased
